# Patient Record
Sex: Female | Born: 1985 | Race: Black or African American | Hispanic: No | Marital: Single | Smoking: Current every day smoker
Health system: Southern US, Community
[De-identification: ages and names within clinical notes are randomized; demographics above are authoritative.]

## PROBLEM LIST (undated history)

## (undated) ENCOUNTER — Inpatient Hospital Stay (HOSPITAL_COMMUNITY): Payer: Self-pay

## (undated) DIAGNOSIS — J45909 Unspecified asthma, uncomplicated: Secondary | ICD-10-CM

## (undated) DIAGNOSIS — Z862 Personal history of diseases of the blood and blood-forming organs and certain disorders involving the immune mechanism: Secondary | ICD-10-CM

## (undated) DIAGNOSIS — E669 Obesity, unspecified: Secondary | ICD-10-CM

## (undated) DIAGNOSIS — F4310 Post-traumatic stress disorder, unspecified: Secondary | ICD-10-CM

## (undated) DIAGNOSIS — D649 Anemia, unspecified: Secondary | ICD-10-CM

## (undated) DIAGNOSIS — I10 Essential (primary) hypertension: Secondary | ICD-10-CM

## (undated) DIAGNOSIS — F32A Depression, unspecified: Secondary | ICD-10-CM

## (undated) DIAGNOSIS — G9600 Cerebrospinal fluid leak, unspecified: Secondary | ICD-10-CM

## (undated) DIAGNOSIS — R51 Headache: Secondary | ICD-10-CM

## (undated) DIAGNOSIS — K219 Gastro-esophageal reflux disease without esophagitis: Secondary | ICD-10-CM

## (undated) DIAGNOSIS — L905 Scar conditions and fibrosis of skin: Secondary | ICD-10-CM

## (undated) DIAGNOSIS — F419 Anxiety disorder, unspecified: Secondary | ICD-10-CM

## (undated) DIAGNOSIS — R12 Heartburn: Secondary | ICD-10-CM

## (undated) DIAGNOSIS — R519 Headache, unspecified: Secondary | ICD-10-CM

## (undated) DIAGNOSIS — E119 Type 2 diabetes mellitus without complications: Secondary | ICD-10-CM

## (undated) DIAGNOSIS — F329 Major depressive disorder, single episode, unspecified: Secondary | ICD-10-CM

## (undated) HISTORY — PX: BREAST SURGERY: SHX581

---

## 2013-04-01 ENCOUNTER — Other Ambulatory Visit: Payer: Self-pay | Admitting: Obstetrics & Gynecology

## 2013-04-01 DIAGNOSIS — O3680X0 Pregnancy with inconclusive fetal viability, not applicable or unspecified: Secondary | ICD-10-CM

## 2013-04-02 ENCOUNTER — Ambulatory Visit (INDEPENDENT_AMBULATORY_CARE_PROVIDER_SITE_OTHER): Payer: Medicaid Other

## 2013-04-02 ENCOUNTER — Other Ambulatory Visit: Payer: Self-pay | Admitting: Obstetrics & Gynecology

## 2013-04-02 DIAGNOSIS — O3680X0 Pregnancy with inconclusive fetal viability, not applicable or unspecified: Secondary | ICD-10-CM

## 2013-04-02 DIAGNOSIS — O26849 Uterine size-date discrepancy, unspecified trimester: Secondary | ICD-10-CM

## 2013-04-02 DIAGNOSIS — R1907 Generalized intra-abdominal and pelvic swelling, mass and lump: Secondary | ICD-10-CM

## 2013-04-02 NOTE — Progress Notes (Signed)
U/S-single IUP with +FCA noted, CRL c/w 8wks EDD 11/12/2013, cx long and closed, bilateral ovaries identified and wnl,**mid pelvic cystic mass =19.5x16.0cm, thin walled, no septations,debris or solid material noted, bilateral maternal kidneys appear wnl no hydronephrosis noted, no ascites noted. Dr. Despina Hidden observed real time scan of pelvic mass.

## 2013-04-14 ENCOUNTER — Encounter (HOSPITAL_COMMUNITY): Payer: Self-pay | Admitting: Obstetrics and Gynecology

## 2013-04-14 ENCOUNTER — Inpatient Hospital Stay (HOSPITAL_COMMUNITY)
Admission: AD | Admit: 2013-04-14 | Discharge: 2013-04-14 | Disposition: A | Discharge status: Home / Self Care | Payer: Medicaid Other | Source: Ambulatory Visit | Attending: Obstetrics & Gynecology | Admitting: Obstetrics & Gynecology

## 2013-04-14 DIAGNOSIS — O219 Vomiting of pregnancy, unspecified: Secondary | ICD-10-CM

## 2013-04-14 DIAGNOSIS — O210 Mild hyperemesis gravidarum: Secondary | ICD-10-CM

## 2013-04-14 HISTORY — DX: Obesity, unspecified: E66.9

## 2013-04-14 LAB — URINALYSIS, ROUTINE W REFLEX MICROSCOPIC
Bilirubin Urine: NEGATIVE
Hgb urine dipstick: NEGATIVE
Nitrite: NEGATIVE
Specific Gravity, Urine: 1.015 (ref 1.005–1.030)
Urobilinogen, UA: 0.2 mg/dL (ref 0.0–1.0)
pH: 8.5 — ABNORMAL HIGH (ref 5.0–8.0)

## 2013-04-14 MED ORDER — ONDANSETRON 8 MG PO TBDP: 8.0000 mg | ORAL_TABLET | Freq: Three times a day (TID) | ORAL | Status: DC | PRN

## 2013-04-14 MED ORDER — ONDANSETRON 8 MG PO TBDP
8.0000 mg | ORAL_TABLET | Freq: Once | ORAL | Status: AC
  Administered 2013-04-14: 8 mg via ORAL
  Filled 2013-04-14: qty 1

## 2013-04-14 NOTE — MAU Provider Note (Signed)
History     CSN: [id number]  Arrival date and time: 04/14/13 1019   None     Chief Complaint  Patient presents with  . Morning Sickness  . Headache   HPI 27 y.o. G1P0 at [redacted]w[redacted]d with n/v x 1 week, not able to keep anything down for the last couple of days.   Past Medical History  Diagnosis Date  . Ovarian cyst   . Obesity     Past Surgical History  Procedure Laterality Date  . No past surgeries      History reviewed. No pertinent family history.  History  Substance Use Topics  . Smoking status: Never Smoker   . Smokeless tobacco: Not on file  . Alcohol Use: No    Allergies: No Known Allergies  No prescriptions prior to admission    Review of Systems  Constitutional: Negative.   Respiratory: Negative.   Cardiovascular: Negative.   Gastrointestinal: Positive for nausea and vomiting. Negative for abdominal pain, diarrhea and constipation.  Genitourinary: Negative for dysuria, urgency, frequency, hematuria and flank pain.       Negative for vaginal bleeding, vaginal discharge  Musculoskeletal: Negative.   Neurological: Positive for headaches.  Psychiatric/Behavioral: Negative.    Physical Exam   Blood pressure 91/59, pulse 79, temperature 98.2 F (36.8 C), temperature source Oral, resp. rate 18.  Physical Exam  Nursing note and vitals reviewed. Constitutional: She is oriented to person, place, and time. She appears well-developed and well-nourished. No distress.  Morbidly obese   Cardiovascular: Regular rhythm.   Respiratory: Effort normal.  Musculoskeletal: Normal range of motion.  Neurological: She is alert and oriented to person, place, and time.  Skin: Skin is warm and dry.  Psychiatric: She has a normal mood and affect.    MAU Course  Procedures  Results for orders placed during the hospital encounter of 04/14/13 (from the past 24 hour(s))  URINALYSIS, ROUTINE W REFLEX MICROSCOPIC     Status: Abnormal   Collection Time    04/14/13 10:30 AM       Result Value Range   Color, Urine YELLOW  YELLOW   APPearance CLEAR  CLEAR   Specific Gravity, Urine 1.015  1.005 - 1.030   pH 8.5 (*) 5.0 - 8.0   Glucose, UA NEGATIVE  NEGATIVE mg/dL   Hgb urine dipstick NEGATIVE  NEGATIVE   Bilirubin Urine NEGATIVE  NEGATIVE   Ketones, ur NEGATIVE  NEGATIVE mg/dL   Protein, ur NEGATIVE  NEGATIVE mg/dL   Urobilinogen, UA 0.2  0.0 - 1.0 mg/dL   Nitrite NEGATIVE  NEGATIVE   Leukocytes, UA NEGATIVE  NEGATIVE   Zofran ODT 8 mg given in MAU, able to tolerate PO fluids  Assessment and Plan   1. Nausea and vomiting in pregnancy prior to [redacted] weeks gestation       Medication List         ondansetron 8 MG disintegrating tablet  Commonly known as:  ZOFRAN ODT  Take 1 tablet (8 mg total) by mouth every 8 (eight) hours as needed for nausea.     prenatal multivitamin Tabs  Take 1 tablet by mouth daily at 12 noon.     pseudoephedrine-acetaminophen 30-500 MG Tabs  Commonly known as:  TYLENOL SINUS  Take 2 tablets by mouth every 4 (four) hours as needed (sinus headache).        Follow-up Information   Follow up with [facility].   Contact information:   [address] [place]  [place] [address] [phone]        FRAZIER,NATALIE 04/14/2013, 2:24 PM

## 2013-04-14 NOTE — MAU Note (Signed)
Pt presents with complaints of nausea and vomiting that started approximately one week ago, she now has a headache and not able to keep anything down.

## 2013-04-15 ENCOUNTER — Encounter: Payer: Self-pay | Admitting: Women's Health

## 2013-04-15 ENCOUNTER — Other Ambulatory Visit (HOSPITAL_COMMUNITY)
Admission: RE | Admit: 2013-04-15 | Discharge: 2013-04-15 | Disposition: A | Discharge status: Home / Self Care | Payer: Medicaid Other | Source: Ambulatory Visit | Attending: Obstetrics & Gynecology | Admitting: Obstetrics & Gynecology

## 2013-04-15 ENCOUNTER — Ambulatory Visit (INDEPENDENT_AMBULATORY_CARE_PROVIDER_SITE_OTHER): Payer: Medicaid Other | Admitting: Women's Health

## 2013-04-15 VITALS — BP 136/76 | Ht 67.5 in | Wt 341.5 lb

## 2013-04-15 DIAGNOSIS — Z3400 Encounter for supervision of normal first pregnancy, unspecified trimester: Secondary | ICD-10-CM

## 2013-04-15 DIAGNOSIS — Z331 Pregnant state, incidental: Secondary | ICD-10-CM

## 2013-04-15 DIAGNOSIS — Z113 Encounter for screening for infections with a predominantly sexual mode of transmission: Secondary | ICD-10-CM | POA: Insufficient documentation

## 2013-04-15 DIAGNOSIS — Z1389 Encounter for screening for other disorder: Secondary | ICD-10-CM

## 2013-04-15 DIAGNOSIS — Z3401 Encounter for supervision of normal first pregnancy, first trimester: Secondary | ICD-10-CM

## 2013-04-15 DIAGNOSIS — R1900 Intra-abdominal and pelvic swelling, mass and lump, unspecified site: Secondary | ICD-10-CM

## 2013-04-15 DIAGNOSIS — Z01419 Encounter for gynecological examination (general) (routine) without abnormal findings: Secondary | ICD-10-CM | POA: Insufficient documentation

## 2013-04-15 DIAGNOSIS — Z6841 Body Mass Index (BMI) 40.0 and over, adult: Secondary | ICD-10-CM

## 2013-04-15 LAB — POCT URINALYSIS DIPSTICK
Blood, UA: NEGATIVE
Leukocytes, UA: NEGATIVE
Nitrite, UA: NEGATIVE
Protein, UA: NEGATIVE

## 2013-04-15 LAB — CBC
HCT: 32.8 % — ABNORMAL LOW (ref 36.0–46.0)
MCH: 27.9 pg (ref 26.0–34.0)
MCV: 81 fL (ref 78.0–100.0)
Platelets: 256 10*3/uL (ref 150–400)
RBC: 4.05 MIL/uL (ref 3.87–5.11)
WBC: 10.1 10*3/uL (ref 4.0–10.5)

## 2013-04-15 MED ORDER — PRENATAL MULTIVITAMIN CH: 1.0000 | ORAL_TABLET | Freq: Every day | ORAL | Status: DC

## 2013-04-15 NOTE — Progress Notes (Signed)
New OB packet given. Consents signed.  

## 2013-04-15 NOTE — Patient Instructions (Signed)
Nausea & Vomiting  Have saltine crackers or pretzels by your bed and eat a few bites before you raise your head out of bed in the morning  Eat small frequent meals throughout the day instead of large meals  Drink plenty of fluids throughout the day to stay hydrated, just don't drink a lot of fluids with your meals.  This can make your stomach fill up faster making you feel sick  Do not brush your teeth right after you eat  Products with real ginger are good for nausea, like ginger ale and ginger hard candy Make sure it says made with real ginger!  Sucking on sour candy like lemon heads is also good for nausea  If your prenatal vitamins make you nauseated, take them at night so you will sleep through the nausea  If you feel like you need medicine for the nausea & vomiting please let us know  If you are unable to keep any fluids or food down please let us know    Pregnancy - First Trimester During sexual intercourse, millions of sperm go into the vagina. Only 1 sperm will penetrate and fertilize the female egg while it is in the Fallopian tube. One week later, the fertilized egg implants into the wall of the uterus. An embryo begins to develop into a baby. At 6 to 8 weeks, the eyes and face are formed and the heartbeat can be seen on ultrasound. At the end of 12 weeks (first trimester), all the baby's organs are formed. Now that you are pregnant, you will want to do everything you can to have a healthy baby. Two of the most important things are to get good prenatal care and follow your caregiver's instructions. Prenatal care is all the medical care you receive before the baby's birth. It is given to prevent, find, and treat problems during the pregnancy and childbirth. PRENATAL EXAMS  During prenatal visits, your weight, blood pressure, and urine are checked. This is done to make sure you are healthy and progressing normally during the pregnancy.  A pregnant woman should gain 25 to 35 pounds  during the pregnancy. However, if you are overweight or underweight, your caregiver will advise you regarding your weight.  Your caregiver will ask and answer questions for you.  Blood work, cervical cultures, other necessary tests, and a Pap test are done during your prenatal exams. These tests are done to check on your health and the probable health of your baby. Tests are strongly recommended and done for HIV with your permission. This is the virus that causes AIDS. These tests are done because medicines can be given to help prevent your baby from being born with this infection should you have been infected without knowing it. Blood work is also used to find out your blood type, previous infections, and follow your blood levels (hemoglobin).  Low hemoglobin (anemia) is common during pregnancy. Iron and vitamins are given to help prevent this. Later in the pregnancy, blood tests for diabetes will be done along with any other tests if any problems develop.  You may need other tests to make sure you and the baby are doing well. CHANGES DURING THE FIRST TRIMESTER  Your body goes through many changes during pregnancy. They vary from person to person. Talk to your caregiver about changes you notice and are concerned about. Changes can include:  Your menstrual period stops.  The egg and sperm carry the genes that determine what you look like. Genes from you   and your partner are forming a baby. The female genes determine whether the baby is a boy or a girl.  Your body increases in girth and you may feel bloated.  Feeling sick to your stomach (nauseous) and throwing up (vomiting). If the vomiting is uncontrollable, call your caregiver.  Your breasts will begin to enlarge and become tender.  Your nipples may stick out more and become darker.  The need to urinate more. Painful urination may mean you have a bladder infection.  Tiring easily.  Loss of appetite.  Cravings for certain kinds of  food.  At first, you may gain or lose a couple of pounds.  You may have changes in your emotions from day to day (excited to be pregnant or concerned something may go wrong with the pregnancy and baby).  You may have more vivid and strange dreams. HOME CARE INSTRUCTIONS   It is very important to avoid all smoking, alcohol and non-prescribed drugs during your pregnancy. These affect the formation and growth of the baby. Avoid chemicals while pregnant to ensure the delivery of a healthy infant.  Start your prenatal visits by the 12th week of pregnancy. They are usually scheduled monthly at first, then more often in the last 2 months before delivery. Keep your caregiver's appointments. Follow your caregiver's instructions regarding medicine use, blood and lab tests, exercise, and diet.  During pregnancy, you are providing food for you and your baby. Eat regular, well-balanced meals. Choose foods such as meat, fish, milk and other low fat dairy products, vegetables, fruits, and whole-grain breads and cereals. Your caregiver will tell you of the ideal weight gain.  You can help morning sickness by keeping soda crackers at the bedside. Eat a couple before arising in the morning. You may want to use the crackers without salt on them.  Eating 4 to 5 small meals rather than 3 large meals a day also may help the nausea and vomiting.  Drinking liquids between meals instead of during meals also seems to help nausea and vomiting.  A physical sexual relationship may be continued throughout pregnancy if there are no other problems. Problems may be early (premature) leaking of amniotic fluid from the membranes, vaginal bleeding, or belly (abdominal) pain.  Exercise regularly if there are no restrictions. Check with your caregiver or physical therapist if you are unsure of the safety of some of your exercises. Greater weight gain will occur in the last 2 trimesters of pregnancy. Exercising will  help:  Control your weight.  Keep you in shape.  Prepare you for labor and delivery.  Help you lose your pregnancy weight after you deliver your baby.  Wear a good support or jogging bra for breast tenderness during pregnancy. This may help if worn during sleep too.  Ask when prenatal classes are available. Begin classes when they are offered.  Do not use hot tubs, steam rooms, or saunas.  Wear your seat belt when driving. This protects you and your baby if you are in an accident.  Avoid raw meat, uncooked cheese, cat litter boxes, and soil used by cats throughout the pregnancy. These carry germs that can cause birth defects in the baby.  The first trimester is a good time to visit your dentist for your dental health. Getting your teeth cleaned is okay. Use a softer toothbrush and brush gently during pregnancy.  Ask for help if you have financial, counseling, or nutritional needs during pregnancy. Your caregiver will be able to offer counseling for   these needs as well as refer you for other special needs.  Do not take any medicines or herbs unless told by your caregiver.  Inform your caregiver if there is any mental or physical domestic violence.  Make a list of emergency phone numbers of family, friends, hospital, and police and fire departments.  Write down your questions. Take them to your prenatal visit.  Do not douche.  Do not cross your legs.  If you have to stand for long periods of time, rotate you feet or take small steps in a circle.  You may have more vaginal secretions that may require a sanitary pad. Do not use tampons or scented sanitary pads. MEDICINES AND DRUG USE IN PREGNANCY  Take prenatal vitamins as directed. The vitamin should contain 1 milligram of folic acid. Keep all vitamins out of reach of children. Only a couple vitamins or tablets containing iron may be fatal to a baby or young child when ingested.  Avoid use of all medicines, including herbs,  over-the-counter medicines, not prescribed or suggested by your caregiver. Only take over-the-counter or prescription medicines for pain, discomfort, or fever as directed by your caregiver. Do not use aspirin, ibuprofen, or naproxen unless directed by your caregiver.  Let your caregiver also know about herbs you may be using.  Alcohol is related to a number of birth defects. This includes fetal alcohol syndrome. All alcohol, in any form, should be avoided completely. Smoking will cause low birth rate and premature babies.  Street or illegal drugs are very harmful to the baby. They are absolutely forbidden. A baby born to an addicted mother will be addicted at birth. The baby will go through the same withdrawal an adult does.  Let your caregiver know about any medicines that you have to take and for what reason you take them. SEEK MEDICAL CARE IF:  You have any concerns or worries during your pregnancy. It is better to call with your questions if you feel they cannot wait, rather than worry about them. SEEK IMMEDIATE MEDICAL CARE IF:   An unexplained oral temperature above 102 F (38.9 C) develops, or as your caregiver suggests.  You have leaking of fluid from the vagina (birth canal). If leaking membranes are suspected, take your temperature and inform your caregiver of this when you call.  There is vaginal spotting or bleeding. Notify your caregiver of the amount and how many pads are used.  You develop a bad smelling vaginal discharge with a change in the color.  You continue to feel sick to your stomach (nauseated) and have no relief from remedies suggested. You vomit blood or coffee ground-like materials.  You lose more than 2 pounds of weight in 1 week.  You gain more than 2 pounds of weight in 1 week and you notice swelling of your face, hands, feet, or legs.  You gain 5 pounds or more in 1 week (even if you do not have swelling of your hands, face, legs, or feet).  You get  exposed to German measles and have never had them.  You are exposed to fifth disease or chickenpox.  You develop belly (abdominal) pain. Round ligament discomfort is a common non-cancerous (benign) cause of abdominal pain in pregnancy. Your caregiver still must evaluate this.  You develop headache, fever, diarrhea, pain with urination, or shortness of breath.  You fall or are in a car accident or have any kind of trauma.  There is mental or physical violence in your home. Document   Released: 09/27/2001 Document Revised: 06/27/2012 Document Reviewed: 03/31/2009 [employer] Patient Information 2014 [employer].  

## 2013-04-15 NOTE — Progress Notes (Signed)
  Subjective:    Judy Nelson is a 27 y.o. G1P0 African American female at [redacted]w[redacted]d by 8wk u/s, being seen today for her first obstetrical visit.  Her obstetrical history is significant for obesity and pelvic mass.  Pregnancy history fully reviewed. U/S on 6/17 @ 8.0wks revealed 19.5x16cm cystic mass mid pelvis, thin-walled, no septations, debris, or solid materials noted. Bilateral ovaries normal and identified separately from mass.   Patient reports daily n/v, went to [facility] and was rx'd zofran, which is helping.  Denies craming, vb, or UTI s/s.   Filed Vitals:   04/15/13 1605  BP: 136/76  Weight: 341 lb 8 oz (154.903 kg)    HISTORY: OB History   Grav Para Term Preterm Abortions TAB SAB Ect Mult Living   1              # Outc Date GA Lbr Len/2nd Wgt Sex Del Anes PTL Lv   1 CUR              Past Medical History  Diagnosis Date  . Ovarian cyst   . Obesity    Past Surgical History  Procedure Laterality Date  . No past surgeries     History reviewed. No pertinent family history.   Exam    Pelvic Exam:    Perineum: Normal Perineum   Vulva: normal   Vagina:  normal mucosa, normal discharge, no palpable nodules   Uterus        Cervix: normal   Adnexa: Not palpable   Urinary: urethral meatus normal    System:     Skin: normal coloration and turgor, no rashes    Neurologic: oriented, normal mood   Extremities: normal strength, tone, and muscle mass   HEENT PERRLA   Mouth/Teeth mucous membranes moist   Cardiovascular: regular rate and rhythm   Respiratory:  appears well, vitals normal, no respiratory distress, acyanotic, normal RR   Abdomen: soft, non-tender   FHR 173 via u/s Thin prep pap smear obtained    Assessment:    Pregnancy: G1P0 There are no active problems to display for this patient.     [redacted]w[redacted]d G1P0 New OB visit Pregravid BMI 53.1 N/V pregnancy Pelvic mass    Plan:     Initial labs drawn Continue prenatal vitamins Problem list reviewed and  updated Reviewed n/v relief measures and warning s/s to report Reviewed recommended weight gain based on pre-gravid BMI Encouraged well-balanced diet, daily walking Genetic Screening discussed Integrated Screen: declined Cystic fibrosis screening discussed declined Ultrasound discussed; fetal survey: requested Follow up in 4 weeks for visit  Marge Duncans 04/15/2013 4:23 PM

## 2013-04-16 LAB — URINALYSIS
Bilirubin Urine: NEGATIVE
Hgb urine dipstick: NEGATIVE
Ketones, ur: NEGATIVE mg/dL
Nitrite: NEGATIVE
Urobilinogen, UA: 1 mg/dL (ref 0.0–1.0)

## 2013-04-16 LAB — DRUG SCREEN, URINE, NO CONFIRMATION
Barbiturate Quant, Ur: NEGATIVE
Cocaine Metabolites: NEGATIVE
Creatinine,U: 182.8 mg/dL
Marijuana Metabolite: NEGATIVE
Opiate Screen, Urine: NEGATIVE
Phencyclidine (PCP): NEGATIVE

## 2013-04-16 LAB — ANTIBODY SCREEN: Antibody Screen: NEGATIVE

## 2013-04-16 LAB — ABO AND RH

## 2013-04-16 LAB — RUBELLA SCREEN: Rubella: 1.98 Index — ABNORMAL HIGH (ref <0.90)

## 2013-04-16 LAB — HIV ANTIBODY (ROUTINE TESTING W REFLEX): HIV: NONREACTIVE

## 2013-04-16 LAB — VARICELLA ZOSTER ANTIBODY, IGG: Varicella IgG: 2990 Index — ABNORMAL HIGH (ref <135.00)

## 2013-04-17 LAB — URINE CULTURE

## 2013-04-20 ENCOUNTER — Encounter: Payer: Self-pay | Admitting: Women's Health

## 2013-05-15 ENCOUNTER — Encounter: Payer: Self-pay | Admitting: Advanced Practice Midwife

## 2013-05-15 ENCOUNTER — Ambulatory Visit (INDEPENDENT_AMBULATORY_CARE_PROVIDER_SITE_OTHER): Payer: Medicaid Other | Admitting: Advanced Practice Midwife

## 2013-05-15 VITALS — BP 120/70 | Wt 342.0 lb

## 2013-05-15 DIAGNOSIS — Z331 Pregnant state, incidental: Secondary | ICD-10-CM

## 2013-05-15 DIAGNOSIS — E669 Obesity, unspecified: Secondary | ICD-10-CM

## 2013-05-15 DIAGNOSIS — O99019 Anemia complicating pregnancy, unspecified trimester: Secondary | ICD-10-CM

## 2013-05-15 DIAGNOSIS — O219 Vomiting of pregnancy, unspecified: Secondary | ICD-10-CM

## 2013-05-15 DIAGNOSIS — Z1389 Encounter for screening for other disorder: Secondary | ICD-10-CM

## 2013-05-15 DIAGNOSIS — O210 Mild hyperemesis gravidarum: Secondary | ICD-10-CM

## 2013-05-15 DIAGNOSIS — O99210 Obesity complicating pregnancy, unspecified trimester: Secondary | ICD-10-CM

## 2013-05-15 LAB — POCT URINALYSIS DIPSTICK
Blood, UA: NEGATIVE
Glucose, UA: NEGATIVE
Ketones, UA: NEGATIVE

## 2013-05-15 MED ORDER — ONDANSETRON 8 MG PO TBDP: 8.0000 mg | ORAL_TABLET | Freq: Three times a day (TID) | ORAL | Status: DC | PRN

## 2013-05-15 NOTE — Progress Notes (Signed)
NEED REFILL ON ZOFRAN.

## 2013-05-15 NOTE — Progress Notes (Signed)
Diclegis samples given.  Routine questions about pregnancy answered.  F/U in 4 weeks for LROB.

## 2013-05-15 NOTE — Addendum Note (Signed)
Addended by: Cheral Marker on: 05/15/2013 10:47 AM   Modules accepted: Orders

## 2013-05-27 ENCOUNTER — Encounter: Payer: Self-pay | Admitting: Women's Health

## 2013-05-27 ENCOUNTER — Telehealth: Payer: Self-pay | Admitting: Obstetrics and Gynecology

## 2013-05-27 ENCOUNTER — Ambulatory Visit (INDEPENDENT_AMBULATORY_CARE_PROVIDER_SITE_OTHER): Payer: Medicaid Other | Admitting: Women's Health

## 2013-05-27 VITALS — BP 126/80 | Wt 344.0 lb

## 2013-05-27 DIAGNOSIS — Z1389 Encounter for screening for other disorder: Secondary | ICD-10-CM

## 2013-05-27 DIAGNOSIS — J069 Acute upper respiratory infection, unspecified: Secondary | ICD-10-CM

## 2013-05-27 DIAGNOSIS — Z331 Pregnant state, incidental: Secondary | ICD-10-CM

## 2013-05-27 DIAGNOSIS — Z3402 Encounter for supervision of normal first pregnancy, second trimester: Secondary | ICD-10-CM

## 2013-05-27 DIAGNOSIS — E669 Obesity, unspecified: Secondary | ICD-10-CM

## 2013-05-27 DIAGNOSIS — O99019 Anemia complicating pregnancy, unspecified trimester: Secondary | ICD-10-CM

## 2013-05-27 LAB — POCT URINALYSIS DIPSTICK
Glucose, UA: NEGATIVE
Ketones, UA: NEGATIVE
Leukocytes, UA: NEGATIVE

## 2013-05-27 NOTE — Patient Instructions (Addendum)
You may take zyrtec-D or claritin-D as directed on box to help with symptoms. Drink enough water to stay hydrated. Rest. Debrox for wax in ears. You can try a netty pot to help with the congestion. Call back if symptoms aren't improving, or are getting worse. Upper Respiratory Infection, Adult An upper respiratory infection (URI) is also sometimes known as the common cold. The upper respiratory tract includes the nose, sinuses, throat, trachea, and bronchi. Bronchi are the airways leading to the lungs. Most people improve within 1 week, but symptoms can last up to 2 weeks. A residual cough may last even longer.  CAUSES Many different viruses can infect the tissues lining the upper respiratory tract. The tissues become irritated and inflamed and often become very moist. Mucus production is also common. A cold is contagious. You can easily spread the virus to others by oral contact. This includes kissing, sharing a glass, coughing, or sneezing. Touching your mouth or nose and then touching a surface, which is then touched by another person, can also spread the virus. SYMPTOMS  Symptoms typically develop 1 to 3 days after you come in contact with a cold virus. Symptoms vary from person to person. They may include:  Runny nose.  Sneezing.  Nasal congestion.  Sinus irritation.  Sore throat.  Loss of voice (laryngitis).  Cough.  Fatigue.  Muscle aches.  Loss of appetite.  Headache.  Low-grade fever. DIAGNOSIS  You might diagnose your own cold based on familiar symptoms, since most people get a cold 2 to 3 times a year. Your caregiver can confirm this based on your exam. Most importantly, your caregiver can check that your symptoms are not due to another disease such as strep throat, sinusitis, pneumonia, asthma, or epiglottitis. Blood tests, throat tests, and X-rays are not necessary to diagnose a common cold, but they may sometimes be helpful in excluding other more serious diseases. Your  caregiver will decide if any further tests are required. RISKS AND COMPLICATIONS  You may be at risk for a more severe case of the common cold if you smoke cigarettes, have chronic heart disease (such as heart failure) or lung disease (such as asthma), or if you have a weakened immune system. The very young and very old are also at risk for more serious infections. Bacterial sinusitis, middle ear infections, and bacterial pneumonia can complicate the common cold. The common cold can worsen asthma and chronic obstructive pulmonary disease (COPD). Sometimes, these complications can require emergency medical care and may be life-threatening. PREVENTION  The best way to protect against getting a cold is to practice good hygiene. Avoid oral or hand contact with people with cold symptoms. Wash your hands often if contact occurs. There is no clear evidence that vitamin C, vitamin E, echinacea, or exercise reduces the chance of developing a cold. However, it is always recommended to get plenty of rest and practice good nutrition. TREATMENT  Treatment is directed at relieving symptoms. There is no cure. Antibiotics are not effective, because the infection is caused by a virus, not by bacteria. Treatment may include:  Increased fluid intake. Sports drinks offer valuable electrolytes, sugars, and fluids.  Breathing heated mist or steam (vaporizer or shower).  Eating chicken soup or other clear broths, and maintaining good nutrition.  Getting plenty of rest.  Using gargles or lozenges for comfort.  Controlling fevers with ibuprofen or acetaminophen as directed by your caregiver.  Increasing usage of your inhaler if you have asthma. Zinc gel and zinc  lozenges, taken in the first 24 hours of the common cold, can shorten the duration and lessen the severity of symptoms. Pain medicines may help with fever, muscle aches, and throat pain. A variety of non-prescription medicines are available to treat congestion  and runny nose. Your caregiver can make recommendations and may suggest nasal or lung inhalers for other symptoms.  HOME CARE INSTRUCTIONS   Only take over-the-counter or prescription medicines for pain, discomfort, or fever as directed by your caregiver.  Use a warm mist humidifier or inhale steam from a shower to increase air moisture. This may keep secretions moist and make it easier to breathe.  Drink enough water and fluids to keep your urine clear or pale yellow.  Rest as needed.  Return to work when your temperature has returned to normal or as your caregiver advises. You may need to stay home longer to avoid infecting others. You can also use a face mask and careful hand washing to prevent spread of the virus. SEEK MEDICAL CARE IF:   After the first few days, you feel you are getting worse rather than better.  You need your caregiver's advice about medicines to control symptoms.  You develop chills, worsening shortness of breath, or brown or red sputum. These may be signs of pneumonia.  You develop yellow or brown nasal discharge or pain in the face, especially when you bend forward. These may be signs of sinusitis.  You develop a fever, swollen neck glands, pain with swallowing, or white areas in the back of your throat. These may be signs of strep throat. SEEK IMMEDIATE MEDICAL CARE IF:   You have a fever.  You develop severe or persistent headache, ear pain, sinus pain, or chest pain.  You develop wheezing, a prolonged cough, cough up blood, or have a change in your usual mucus (if you have chronic lung disease).  You develop sore muscles or a stiff neck. Document Released: 03/29/2001 Document Revised: 12/26/2011 Document Reviewed: 02/04/2011 [facility] Patient Information 2014 [place], [place].

## 2013-05-27 NOTE — Progress Notes (Signed)
Pt worked in today for cold symptoms that just won't go away. OTC's not working.

## 2013-05-27 NOTE — Telephone Encounter (Signed)
Spoke with pt. Has had cold symptoms over the weekend. + congestion, stuffy nose, hard to breathe through nose. Ears popping. No fever, + headache. Hurts around nose and sinus area. No wheezing. Has tried Robitussin, Sudafed, and Tylenol Sinus with no improvement. Appt scheduled to come in. JSY

## 2013-05-27 NOTE — Progress Notes (Signed)
Work in: nasal congestion since Thursday. Ears pop/ring, but no pain. No sore throat, fever/chills, non-smoker. Has tried OTC measures w/o relief.  Lt ear: much cerumen, unable to view TM, no pain to palpation of tragus or pinna. Rt ear: much cerumen, but TM appears normal. Oropharynx non-erythematous w/o exudate. 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 Co-exam w/ JAG. Recommend Zyrtec-D or Claritin-D, debrox for cerumen removal, sleep in recliner, call if worsens, or not improving. Keep next appt as scheduled in few weeks.

## 2013-06-12 ENCOUNTER — Encounter: Payer: Self-pay | Admitting: Advanced Practice Midwife

## 2013-06-12 ENCOUNTER — Ambulatory Visit (INDEPENDENT_AMBULATORY_CARE_PROVIDER_SITE_OTHER): Payer: Medicaid Other | Admitting: Advanced Practice Midwife

## 2013-06-12 VITALS — BP 110/80 | Wt 348.0 lb

## 2013-06-12 DIAGNOSIS — O99019 Anemia complicating pregnancy, unspecified trimester: Secondary | ICD-10-CM

## 2013-06-12 DIAGNOSIS — Z3402 Encounter for supervision of normal first pregnancy, second trimester: Secondary | ICD-10-CM

## 2013-06-12 DIAGNOSIS — Z1389 Encounter for screening for other disorder: Secondary | ICD-10-CM

## 2013-06-12 DIAGNOSIS — E669 Obesity, unspecified: Secondary | ICD-10-CM

## 2013-06-12 DIAGNOSIS — Z331 Pregnant state, incidental: Secondary | ICD-10-CM

## 2013-06-12 LAB — POCT URINALYSIS DIPSTICK: Blood, UA: NEGATIVE

## 2013-06-12 NOTE — Progress Notes (Signed)
No c/o at this time.  Routine questions about pregnancy answered.  F/U in 2 weeks for LROB, reevaluate pelvic mass.

## 2013-06-25 ENCOUNTER — Encounter: Payer: Self-pay | Admitting: Obstetrics & Gynecology

## 2013-06-25 ENCOUNTER — Ambulatory Visit (INDEPENDENT_AMBULATORY_CARE_PROVIDER_SITE_OTHER): Payer: Medicaid Other | Admitting: Obstetrics & Gynecology

## 2013-06-25 ENCOUNTER — Ambulatory Visit (INDEPENDENT_AMBULATORY_CARE_PROVIDER_SITE_OTHER): Payer: Medicaid Other

## 2013-06-25 VITALS — BP 120/80 | Wt 341.0 lb

## 2013-06-25 DIAGNOSIS — Z3402 Encounter for supervision of normal first pregnancy, second trimester: Secondary | ICD-10-CM

## 2013-06-25 DIAGNOSIS — Z1389 Encounter for screening for other disorder: Secondary | ICD-10-CM

## 2013-06-25 DIAGNOSIS — Z331 Pregnant state, incidental: Secondary | ICD-10-CM

## 2013-06-25 DIAGNOSIS — E669 Obesity, unspecified: Secondary | ICD-10-CM

## 2013-06-25 DIAGNOSIS — O99019 Anemia complicating pregnancy, unspecified trimester: Secondary | ICD-10-CM

## 2013-06-25 LAB — POCT URINALYSIS DIPSTICK
Ketones, UA: NEGATIVE
Protein, UA: 1

## 2013-06-25 MED ORDER — OMEPRAZOLE 20 MG PO CPDR: 20.0000 mg | DELAYED_RELEASE_CAPSULE | Freq: Every day | ORAL | Status: DC

## 2013-06-25 NOTE — Progress Notes (Signed)
U/S(20+0wks)-active fetus, meas c/w dates, fluid wnl, post gr 0 plac, cx long and closed, Lt ov appears wnl, Rt adnexal cystic mass remains =19.8x17.2x11cm, no obvious abnl noted although incomplete anatomy screen due to fetal position and maternal body habitus, would like to reck anatomy and cystic mass at about 26 weeks, female fetus

## 2013-06-25 NOTE — Progress Notes (Signed)
For UP U/S

## 2013-06-25 NOTE — Progress Notes (Signed)
Sonogram noted, will repeat in 6-8 weeks at the time of PNII BP weight and urine results all reviewed and noted. Patient reports good fetal movement, denies any bleeding and no rupture of membranes symptoms or regular contractions. Patient is without complaints. All questions were answered.

## 2013-07-16 ENCOUNTER — Encounter: Payer: Self-pay | Admitting: Women's Health

## 2013-07-16 ENCOUNTER — Ambulatory Visit (INDEPENDENT_AMBULATORY_CARE_PROVIDER_SITE_OTHER): Payer: Medicaid Other | Admitting: Women's Health

## 2013-07-16 VITALS — BP 128/74 | Wt 347.0 lb

## 2013-07-16 DIAGNOSIS — Z23 Encounter for immunization: Secondary | ICD-10-CM

## 2013-07-16 DIAGNOSIS — Z331 Pregnant state, incidental: Secondary | ICD-10-CM

## 2013-07-16 DIAGNOSIS — Z1389 Encounter for screening for other disorder: Secondary | ICD-10-CM

## 2013-07-16 DIAGNOSIS — Z3402 Encounter for supervision of normal first pregnancy, second trimester: Secondary | ICD-10-CM

## 2013-07-16 DIAGNOSIS — E669 Obesity, unspecified: Secondary | ICD-10-CM

## 2013-07-16 DIAGNOSIS — O99019 Anemia complicating pregnancy, unspecified trimester: Secondary | ICD-10-CM

## 2013-07-16 LAB — POCT URINALYSIS DIPSTICK
Glucose, UA: NEGATIVE
Ketones, UA: NEGATIVE
Leukocytes, UA: NEGATIVE

## 2013-07-16 MED ORDER — INFLUENZA VAC SPLIT QUAD 0.5 ML IM SUSP
0.5000 mL | Freq: Once | INTRAMUSCULAR | Status: AC
  Administered 2013-07-16: 0.5 mL via INTRAMUSCULAR

## 2013-07-16 NOTE — Progress Notes (Signed)
Reports good fm. Denies uc's, lof, vb, urinary frequency, urgency, hesitancy, or dysuria.  No complaints.  FH measured from U. Desires flu shot today. Reviewed ptl s/s, fm.  All questions answered. F/U in 3wks for pn2, f/u anatomy u/s, and visit.

## 2013-07-16 NOTE — Patient Instructions (Addendum)
You will have your sugar test next visit.  Please do not eat or drink anything after midnight the night before you come, not even water.  You will be here for at least two hours.    Pregnancy - Second Trimester The second trimester of pregnancy (3 to 6 months) is a period of rapid growth for you and your baby. At the end of the sixth month, your baby is about 9 inches long and weighs 1 1/2 pounds. You will begin to feel the baby move between 18 and 20 weeks of the pregnancy. This is called quickening. Weight gain is faster. A clear fluid (colostrum) may leak out of your breasts. You may feel small contractions of the womb (uterus). This is known as false labor or Braxton-Hicks contractions. This is like a practice for labor when the baby is ready to be born. Usually, the problems with morning sickness have usually passed by the end of your first trimester. Some women develop small dark blotches (called cholasma, mask of pregnancy) on their face that usually goes away after the baby is born. Exposure to the sun makes the blotches worse. Acne may also develop in some pregnant women and pregnant women who have acne, may find that it goes away. PRENATAL EXAMS  Blood work may continue to be done during prenatal exams. These tests are done to check on your health and the probable health of your baby. Blood work is used to follow your blood levels (hemoglobin). Anemia (low hemoglobin) is common during pregnancy. Iron and vitamins are given to help prevent this. You will also be checked for diabetes between 24 and 28 weeks of the pregnancy. Some of the previous blood tests may be repeated.  The size of the uterus is measured during each visit. This is to make sure that the baby is continuing to grow properly according to the dates of the pregnancy.  Your blood pressure is checked every prenatal visit. This is to make sure you are not getting toxemia.  Your urine is checked to make sure you do not have an  infection, diabetes or protein in the urine.  Your weight is checked often to make sure gains are happening at the suggested rate. This is to ensure that both you and your baby are growing normally.  Sometimes, an ultrasound is performed to confirm the proper growth and development of the baby. This is a test which bounces harmless sound waves off the baby so your caregiver can more accurately determine due dates. Sometimes, a test is done on the amniotic fluid surrounding the baby. This test is called an amniocentesis. The amniotic fluid is obtained by sticking a needle into the belly (abdomen). This is done to check the chromosomes in instances where there is a concern about possible genetic problems with the baby. It is also sometimes done near the end of pregnancy if an early delivery is required. In this case, it is done to help make sure the baby's lungs are mature enough for the baby to live outside of the womb. CHANGES OCCURING IN THE SECOND TRIMESTER OF PREGNANCY Your body goes through many changes during pregnancy. They vary from person to person. Talk to your caregiver about changes you notice that you are concerned about.  During the second trimester, you will likely have an increase in your appetite. It is normal to have cravings for certain foods. This varies from person to person and pregnancy to pregnancy.  Your lower abdomen will begin to   bulge.  You may have to urinate more often because the uterus and baby are pressing on your bladder. It is also common to get more bladder infections during pregnancy. You can help this by drinking lots of fluids and emptying your bladder before and after intercourse.  You may begin to get stretch marks on your hips, abdomen, and breasts. These are normal changes in the body during pregnancy. There are no exercises or medicines to take that prevent this change.  You may begin to develop swollen and bulging veins (varicose veins) in your legs.  Wearing support hose, elevating your feet for 15 minutes, 3 to 4 times a day and limiting salt in your diet helps lessen the problem.  Heartburn may develop as the uterus grows and pushes up against the stomach. Antacids recommended by your caregiver helps with this problem. Also, eating smaller meals 4 to 5 times a day helps.  Constipation can be treated with a stool softener or adding bulk to your diet. Drinking lots of fluids, and eating vegetables, fruits, and whole grains are helpful.  Exercising is also helpful. If you have been very active up until your pregnancy, most of these activities can be continued during your pregnancy. If you have been less active, it is helpful to start an exercise program such as walking.  Hemorrhoids may develop at the end of the second trimester. Warm sitz baths and hemorrhoid cream recommended by your caregiver helps hemorrhoid problems.  Backaches may develop during this time of your pregnancy. Avoid heavy lifting, wear low heal shoes, and practice good posture to help with backache problems.  Some pregnant women develop tingling and numbness of their hand and fingers because of swelling and tightening of ligaments in the wrist (carpel tunnel syndrome). This goes away after the baby is born.  As your breasts enlarge, you may have to get a bigger bra. Get a comfortable, cotton, support bra. Do not get a nursing bra until the last month of the pregnancy if you will be nursing the baby.  You may get a dark line from your belly button to the pubic area called the linea nigra.  You may develop rosy cheeks because of increase blood flow to the face.  You may develop spider looking lines of the face, neck, arms, and chest. These go away after the baby is born. HOME CARE INSTRUCTIONS   It is extremely important to avoid all smoking, herbs, alcohol, and unprescribed drugs during your pregnancy. These chemicals affect the formation and growth of the baby. Avoid  these chemicals throughout the pregnancy to ensure the delivery of a healthy infant.  Most of your home care instructions are the same as suggested for the first trimester of your pregnancy. Keep your caregiver's appointments. Follow your caregiver's instructions regarding medicine use, exercise, and diet.  During pregnancy, you are providing food for you and your baby. Continue to eat regular, well-balanced meals. Choose foods such as meat, fish, milk and other low fat dairy products, vegetables, fruits, and whole-grain breads and cereals. Your caregiver will tell you of the ideal weight gain.  A physical sexual relationship may be continued up until near the end of pregnancy if there are no other problems. Problems could include early (premature) leaking of amniotic fluid from the membranes, vaginal bleeding, abdominal pain, or other medical or pregnancy problems.  Exercise regularly if there are no restrictions. Check with your caregiver if you are unsure of the safety of some of your exercises. The   greatest weight gain will occur in the last 2 trimesters of pregnancy. Exercise will help you:  Control your weight.  Get you in shape for labor and delivery.  Lose weight after you have the baby.  Wear a good support or jogging bra for breast tenderness during pregnancy. This may help if worn during sleep. Pads or tissues may be used in the bra if you are leaking colostrum.  Do not use hot tubs, steam rooms or saunas throughout the pregnancy.  Wear your seat belt at all times when driving. This protects you and your baby if you are in an accident.  Avoid raw meat, uncooked cheese, cat litter boxes, and soil used by cats. These carry germs that can cause birth defects in the baby.  The second trimester is also a good time to visit your dentist for your dental health if this has not been done yet. Getting your teeth cleaned is okay. Use a soft toothbrush. Brush gently during pregnancy.  It is  easier to leak urine during pregnancy. Tightening up and strengthening the pelvic muscles will help with this problem. Practice stopping your urination while you are going to the bathroom. These are the same muscles you need to strengthen. It is also the muscles you would use as if you were trying to stop from passing gas. You can practice tightening these muscles up 10 times a set and repeating this about 3 times per day. Once you know what muscles to tighten up, do not perform these exercises during urination. It is more likely to contribute to an infection by backing up the urine.  Ask for help if you have financial, counseling, or nutritional needs during pregnancy. Your caregiver will be able to offer counseling for these needs as well as refer you for other special needs.  Your skin may become oily. If so, wash your face with mild soap, use non-greasy moisturizer and oil or cream based makeup. MEDICINES AND DRUG USE IN PREGNANCY  Take prenatal vitamins as directed. The vitamin should contain 1 milligram of folic acid. Keep all vitamins out of reach of children. Only a couple vitamins or tablets containing iron may be fatal to a baby or young child when ingested.  Avoid use of all medicines, including herbs, over-the-counter medicines, not prescribed or suggested by your caregiver. Only take over-the-counter or prescription medicines for pain, discomfort, or fever as directed by your caregiver. Do not use aspirin.  Let your caregiver also know about herbs you may be using.  Alcohol is related to a number of birth defects. This includes fetal alcohol syndrome. All alcohol, in any form, should be avoided completely. Smoking will cause low birth rate and premature babies.  Street or illegal drugs are very harmful to the baby. They are absolutely forbidden. A baby born to an addicted mother will be addicted at birth. The baby will go through the same withdrawal an adult does. SEEK MEDICAL CARE IF:    You have any concerns or worries during your pregnancy. It is better to call with your questions if you feel they cannot wait, rather than worry about them. SEEK IMMEDIATE MEDICAL CARE IF:   An unexplained oral temperature above 102 F (38.9 C) develops, or as your caregiver suggests.  You have leaking of fluid from the vagina (birth canal). If leaking membranes are suspected, take your temperature and tell your caregiver of this when you call.  There is vaginal spotting, bleeding, or passing clots. Tell your caregiver of   the amount and how many pads are used. Light spotting in pregnancy is common, especially following intercourse.  You develop a bad smelling vaginal discharge with a change in the color from clear to white.  You continue to feel sick to your stomach (nauseated) and have no relief from remedies suggested. You vomit blood or coffee ground-like materials.  You lose more than 2 pounds of weight or gain more than 2 pounds of weight over 1 week, or as suggested by your caregiver.  You notice swelling of your face, hands, feet, or legs.  You get exposed to German measles and have never had them.  You are exposed to fifth disease or chickenpox.  You develop belly (abdominal) pain. Round ligament discomfort is a common non-cancerous (benign) cause of abdominal pain in pregnancy. Your caregiver still must evaluate you.  You develop a bad headache that does not go away.  You develop fever, diarrhea, pain with urination, or shortness of breath.  You develop visual problems, blurry, or double vision.  You fall or are in a car accident or any kind of trauma.  There is mental or physical violence at home. Document Released: 09/27/2001 Document Revised: 06/27/2012 Document Reviewed: 04/01/2009 [employer] Patient Information 2014 [employer].  

## 2013-07-16 NOTE — Progress Notes (Signed)
Pt here today for routine visit. Pt states that she has had some round ligament pains at times. Pt states she is still having some nausea. Pt denies any other problems or concerns at this time.

## 2013-08-06 ENCOUNTER — Encounter: Payer: Self-pay | Admitting: Women's Health

## 2013-08-06 ENCOUNTER — Other Ambulatory Visit: Payer: Medicaid Other

## 2013-08-06 ENCOUNTER — Other Ambulatory Visit: Payer: Self-pay | Admitting: Women's Health

## 2013-08-06 ENCOUNTER — Ambulatory Visit (INDEPENDENT_AMBULATORY_CARE_PROVIDER_SITE_OTHER): Payer: Medicaid Other | Admitting: Women's Health

## 2013-08-06 ENCOUNTER — Ambulatory Visit (INDEPENDENT_AMBULATORY_CARE_PROVIDER_SITE_OTHER): Payer: Medicaid Other

## 2013-08-06 ENCOUNTER — Encounter (INDEPENDENT_AMBULATORY_CARE_PROVIDER_SITE_OTHER): Payer: Self-pay

## 2013-08-06 VITALS — BP 112/72 | Wt 326.0 lb

## 2013-08-06 DIAGNOSIS — R1900 Intra-abdominal and pelvic swelling, mass and lump, unspecified site: Secondary | ICD-10-CM

## 2013-08-06 DIAGNOSIS — O358XX0 Maternal care for other (suspected) fetal abnormality and damage, not applicable or unspecified: Secondary | ICD-10-CM

## 2013-08-06 DIAGNOSIS — E669 Obesity, unspecified: Secondary | ICD-10-CM

## 2013-08-06 DIAGNOSIS — O99019 Anemia complicating pregnancy, unspecified trimester: Secondary | ICD-10-CM

## 2013-08-06 DIAGNOSIS — O99212 Obesity complicating pregnancy, second trimester: Secondary | ICD-10-CM

## 2013-08-06 DIAGNOSIS — Z331 Pregnant state, incidental: Secondary | ICD-10-CM

## 2013-08-06 DIAGNOSIS — Z1389 Encounter for screening for other disorder: Secondary | ICD-10-CM

## 2013-08-06 DIAGNOSIS — Z3402 Encounter for supervision of normal first pregnancy, second trimester: Secondary | ICD-10-CM

## 2013-08-06 DIAGNOSIS — O99891 Other specified diseases and conditions complicating pregnancy: Secondary | ICD-10-CM

## 2013-08-06 DIAGNOSIS — Z3400 Encounter for supervision of normal first pregnancy, unspecified trimester: Secondary | ICD-10-CM

## 2013-08-06 LAB — POCT URINALYSIS DIPSTICK
Blood, UA: NEGATIVE
Ketones, UA: NEGATIVE

## 2013-08-06 LAB — CBC
HCT: 30.7 % — ABNORMAL LOW (ref 36.0–46.0)
Hemoglobin: 10.6 g/dL — ABNORMAL LOW (ref 12.0–15.0)
MCHC: 34.5 g/dL (ref 30.0–36.0)
RBC: 3.78 MIL/uL — ABNORMAL LOW (ref 3.87–5.11)
WBC: 13.5 10*3/uL — ABNORMAL HIGH (ref 4.0–10.5)

## 2013-08-06 NOTE — Progress Notes (Signed)
Reports good fm. Denies uc's, lof, vb, urinary frequency, urgency, hesitancy, or dysuria.  No complaints.  Reviewed u/s, ptl s/s, fm.  All questions answered. PN2 today. F/U in 4wks for visit.

## 2013-08-06 NOTE — Patient Instructions (Signed)

## 2013-08-06 NOTE — Progress Notes (Signed)
U/S(26+0wks)- vtx active fetus, approp growth EFW 1 lb 15 oz (49th%tile), fluid wnl, post gr 1 plac, no obvious abnl noted although Rt cystic adnexal mass remains = 19.5 x 18.9 x 15.0cm, female fetus "[place]"

## 2013-08-07 LAB — GLUCOSE TOLERANCE, 2 HOURS W/ 1HR
Glucose, 1 hour: 151 mg/dL (ref 70–170)
Glucose, 2 hour: 139 mg/dL (ref 70–139)
Glucose, Fasting: 87 mg/dL (ref 70–99)

## 2013-08-07 LAB — HSV 2 ANTIBODY, IGG: HSV 2 Glycoprotein G Ab, IgG: <0.1 IV

## 2013-08-07 LAB — HIV ANTIBODY (ROUTINE TESTING W REFLEX): HIV: NONREACTIVE

## 2013-08-10 ENCOUNTER — Encounter: Payer: Self-pay | Admitting: Women's Health

## 2013-09-03 ENCOUNTER — Ambulatory Visit (INDEPENDENT_AMBULATORY_CARE_PROVIDER_SITE_OTHER): Payer: Medicaid Other | Admitting: Obstetrics & Gynecology

## 2013-09-03 ENCOUNTER — Encounter: Payer: Self-pay | Admitting: Obstetrics & Gynecology

## 2013-09-03 VITALS — BP 100/60 | Wt 354.0 lb

## 2013-09-03 DIAGNOSIS — Z3403 Encounter for supervision of normal first pregnancy, third trimester: Secondary | ICD-10-CM

## 2013-09-03 DIAGNOSIS — Z1389 Encounter for screening for other disorder: Secondary | ICD-10-CM

## 2013-09-03 DIAGNOSIS — E669 Obesity, unspecified: Secondary | ICD-10-CM

## 2013-09-03 DIAGNOSIS — Z331 Pregnant state, incidental: Secondary | ICD-10-CM

## 2013-09-03 DIAGNOSIS — O99891 Other specified diseases and conditions complicating pregnancy: Secondary | ICD-10-CM

## 2013-09-03 DIAGNOSIS — R1900 Intra-abdominal and pelvic swelling, mass and lump, unspecified site: Secondary | ICD-10-CM

## 2013-09-03 DIAGNOSIS — O358XX0 Maternal care for other (suspected) fetal abnormality and damage, not applicable or unspecified: Secondary | ICD-10-CM

## 2013-09-03 DIAGNOSIS — O99019 Anemia complicating pregnancy, unspecified trimester: Secondary | ICD-10-CM

## 2013-09-03 LAB — POCT URINALYSIS DIPSTICK
Ketones, UA: NEGATIVE
Protein, UA: 1

## 2013-09-03 NOTE — Progress Notes (Signed)
BP weight and urine results all reviewed and noted. Patient reports good fetal movement, denies any bleeding and no rupture of membranes symptoms or regular contractions. Patient is without complaints. All questions were answered.  

## 2013-09-03 NOTE — Addendum Note (Signed)
Addended by: Criss Alvine on: 09/03/2013 12:26 PM   Modules accepted: Orders

## 2013-09-17 ENCOUNTER — Ambulatory Visit (INDEPENDENT_AMBULATORY_CARE_PROVIDER_SITE_OTHER): Payer: Medicaid Other | Admitting: Women's Health

## 2013-09-17 ENCOUNTER — Encounter: Payer: Self-pay | Admitting: Women's Health

## 2013-09-17 VITALS — BP 120/80 | Wt 351.6 lb

## 2013-09-17 DIAGNOSIS — O219 Vomiting of pregnancy, unspecified: Secondary | ICD-10-CM

## 2013-09-17 DIAGNOSIS — Z331 Pregnant state, incidental: Secondary | ICD-10-CM

## 2013-09-17 DIAGNOSIS — Z1389 Encounter for screening for other disorder: Secondary | ICD-10-CM

## 2013-09-17 DIAGNOSIS — Z3403 Encounter for supervision of normal first pregnancy, third trimester: Secondary | ICD-10-CM

## 2013-09-17 DIAGNOSIS — O99891 Other specified diseases and conditions complicating pregnancy: Secondary | ICD-10-CM

## 2013-09-17 DIAGNOSIS — O99019 Anemia complicating pregnancy, unspecified trimester: Secondary | ICD-10-CM

## 2013-09-17 DIAGNOSIS — E669 Obesity, unspecified: Secondary | ICD-10-CM

## 2013-09-17 DIAGNOSIS — R1900 Intra-abdominal and pelvic swelling, mass and lump, unspecified site: Secondary | ICD-10-CM

## 2013-09-17 DIAGNOSIS — O358XX0 Maternal care for other (suspected) fetal abnormality and damage, not applicable or unspecified: Secondary | ICD-10-CM

## 2013-09-17 LAB — POCT URINALYSIS DIPSTICK
Blood, UA: NEGATIVE
Glucose, UA: NEGATIVE
Ketones, UA: NEGATIVE
Leukocytes, UA: NEGATIVE
Nitrite, UA: NEGATIVE

## 2013-09-17 MED ORDER — ONDANSETRON 8 MG PO TBDP: 8.0000 mg | ORAL_TABLET | Freq: Three times a day (TID) | ORAL | Status: DC | PRN

## 2013-09-17 NOTE — Progress Notes (Signed)
FH measured from U. Reports good fm. Denies uc's, lof, vb, urinary frequency, urgency, hesitancy, or dysuria.  Pelvic girdle and LBP. Requests refill on zofran. Discussed contraception- desires info on mirena.  Reviewed pn2 results, ptl s/s, fkc.  All questions answered. F/U in 2wks for visit.

## 2013-09-17 NOTE — Patient Instructions (Signed)
Third Trimester of Pregnancy  The third trimester is from week 29 through week 42, months 7 through 9. The third trimester is a time when the fetus is growing rapidly. At the end of the ninth month, the fetus is about 20 inches in length and weighs 6 10 pounds.   BODY CHANGES  Your body goes through many changes during pregnancy. The changes vary from woman to woman.    Your weight will continue to increase. You can expect to gain 25 35 pounds (11 16 kg) by the end of the pregnancy.   You may begin to get stretch marks on your hips, abdomen, and breasts.   You may urinate more often because the fetus is moving lower into your pelvis and pressing on your bladder.   You may develop or continue to have heartburn as a result of your pregnancy.   You may develop constipation because certain hormones are causing the muscles that push waste through your intestines to slow down.   You may develop hemorrhoids or swollen, bulging veins (varicose veins).   You may have pelvic pain because of the weight gain and pregnancy hormones relaxing your joints between the bones in your pelvis. Back aches may result from over exertion of the muscles supporting your posture.   Your breasts will continue to grow and be tender. A yellow discharge may leak from your breasts called colostrum.   Your belly button may stick out.   You may feel short of breath because of your expanding uterus.   You may notice the fetus "dropping," or moving lower in your abdomen.   You may have a bloody mucus discharge. This usually occurs a few days to a week before labor begins.   Your cervix becomes thin and soft (effaced) near your due date.  WHAT TO EXPECT AT YOUR PRENATAL EXAMS   You will have prenatal exams every 2 weeks until week 36. Then, you will have weekly prenatal exams. During a routine prenatal visit:   You will be weighed to make sure you and the fetus are growing normally.   Your blood pressure is taken.   Your abdomen will be  measured to track your baby's growth.   The fetal heartbeat will be listened to.   Any test results from the previous visit will be discussed.   You may have a cervical check near your due date to see if you have effaced.  At around 36 weeks, your caregiver will check your cervix. At the same time, your caregiver will also perform a test on the secretions of the vaginal tissue. This test is to determine if a type of bacteria, Group B streptococcus, is present. Your caregiver will explain this further.  Your caregiver may ask you:   What your birth plan is.   How you are feeling.   If you are feeling the baby move.   If you have had any abnormal symptoms, such as leaking fluid, bleeding, severe headaches, or abdominal cramping.   If you have any questions.  Other tests or screenings that may be performed during your third trimester include:   Blood tests that check for low iron levels (anemia).   Fetal testing to check the health, activity level, and growth of the fetus. Testing is done if you have certain medical conditions or if there are problems during the pregnancy.  FALSE LABOR  You may feel small, irregular contractions that eventually go away. These are called Braxton Hicks contractions, or   false labor. Contractions may last for hours, days, or even weeks before true labor sets in. If contractions come at regular intervals, intensify, or become painful, it is best to be seen by your caregiver.   SIGNS OF LABOR    Menstrual-like cramps.   Contractions that are 5 minutes apart or less.   Contractions that start on the top of the uterus and spread down to the lower abdomen and back.   A sense of increased pelvic pressure or back pain.   A watery or bloody mucus discharge that comes from the vagina.  If you have any of these signs before the 37th week of pregnancy, call your caregiver right away. You need to go to the hospital to get checked immediately.  HOME CARE INSTRUCTIONS    Avoid all  smoking, herbs, alcohol, and unprescribed drugs. These chemicals affect the formation and growth of the baby.   Follow your caregiver's instructions regarding medicine use. There are medicines that are either safe or unsafe to take during pregnancy.   Exercise only as directed by your caregiver. Experiencing uterine cramps is a good sign to stop exercising.   Continue to eat regular, healthy meals.   Wear a good support bra for breast tenderness.   Do not use hot tubs, steam rooms, or saunas.   Wear your seat belt at all times when driving.   Avoid raw meat, uncooked cheese, cat litter boxes, and soil used by cats. These carry germs that can cause birth defects in the baby.   Take your prenatal vitamins.   Try taking a stool softener (if your caregiver approves) if you develop constipation. Eat more high-fiber foods, such as fresh vegetables or fruit and whole grains. Drink plenty of fluids to keep your urine clear or pale yellow.   Take warm sitz baths to soothe any pain or discomfort caused by hemorrhoids. Use hemorrhoid cream if your caregiver approves.   If you develop varicose veins, wear support hose. Elevate your feet for 15 minutes, 3 4 times a day. Limit salt in your diet.   Avoid heavy lifting, wear low heal shoes, and practice good posture.   Rest a lot with your legs elevated if you have leg cramps or low back pain.   Visit your dentist if you have not gone during your pregnancy. Use a soft toothbrush to brush your teeth and be gentle when you floss.   A sexual relationship may be continued unless your caregiver directs you otherwise.   Do not travel far distances unless it is absolutely necessary and only with the approval of your caregiver.   Take prenatal classes to understand, practice, and ask questions about the labor and delivery.   Make a trial run to the hospital.   Pack your hospital bag.   Prepare the baby's nursery.   Continue to go to all your prenatal visits as directed  by your caregiver.  SEEK MEDICAL CARE IF:   You are unsure if you are in labor or if your water has broken.   You have dizziness.   You have mild pelvic cramps, pelvic pressure, or nagging pain in your abdominal area.   You have persistent nausea, vomiting, or diarrhea.   You have a bad smelling vaginal discharge.   You have pain with urination.  SEEK IMMEDIATE MEDICAL CARE IF:    You have a fever.   You are leaking fluid from your vagina.   You have spotting or bleeding from your vagina.     You have severe abdominal cramping or pain.   You have rapid weight loss or gain.   You have shortness of breath with chest pain.   You notice sudden or extreme swelling of your face, hands, ankles, feet, or legs.   You have not felt your baby move in over an hour.   You have severe headaches that do not go away with medicine.   You have vision changes.  Document Released: 09/27/2001 Document Revised: 06/05/2013 Document Reviewed: 12/04/2012  [employer] Patient Information 2014 [employer].

## 2013-10-01 ENCOUNTER — Encounter: Payer: Self-pay | Admitting: Women's Health

## 2013-10-01 ENCOUNTER — Ambulatory Visit (INDEPENDENT_AMBULATORY_CARE_PROVIDER_SITE_OTHER): Payer: Medicaid Other | Admitting: Women's Health

## 2013-10-01 VITALS — BP 128/70 | Wt 357.2 lb

## 2013-10-01 DIAGNOSIS — Z331 Pregnant state, incidental: Secondary | ICD-10-CM

## 2013-10-01 DIAGNOSIS — O358XX0 Maternal care for other (suspected) fetal abnormality and damage, not applicable or unspecified: Secondary | ICD-10-CM

## 2013-10-01 DIAGNOSIS — Z3403 Encounter for supervision of normal first pregnancy, third trimester: Secondary | ICD-10-CM

## 2013-10-01 DIAGNOSIS — R1900 Intra-abdominal and pelvic swelling, mass and lump, unspecified site: Secondary | ICD-10-CM

## 2013-10-01 DIAGNOSIS — O99891 Other specified diseases and conditions complicating pregnancy: Secondary | ICD-10-CM

## 2013-10-01 DIAGNOSIS — O1213 Gestational proteinuria, third trimester: Secondary | ICD-10-CM

## 2013-10-01 DIAGNOSIS — Z1389 Encounter for screening for other disorder: Secondary | ICD-10-CM

## 2013-10-01 DIAGNOSIS — O99019 Anemia complicating pregnancy, unspecified trimester: Secondary | ICD-10-CM

## 2013-10-01 DIAGNOSIS — E669 Obesity, unspecified: Secondary | ICD-10-CM

## 2013-10-01 LAB — POCT URINALYSIS DIPSTICK
Blood, UA: NEGATIVE
Glucose, UA: NEGATIVE
Nitrite, UA: NEGATIVE

## 2013-10-01 NOTE — Progress Notes (Signed)
FH measured from U. Reports good fm. Denies uc's, lof, vb, urinary frequency, urgency, hesitancy, or dysuria.  Some cramping this am that has subsided.  Reviewed ptl s/s, fkc.  All questions answered. F/U in 2wks for visit.

## 2013-10-01 NOTE — Patient Instructions (Signed)
Preterm Labor Information Preterm labor is when labor starts at less than 37 weeks of pregnancy. The normal length of a pregnancy is 39 to 41 weeks. CAUSES Often, there is no identifiable underlying cause as to why a woman goes into preterm labor. One of the most common known causes of preterm labor is infection. Infections of the uterus, cervix, vagina, amniotic sac, bladder, kidney, or even the lungs (pneumonia) can cause labor to start. Other suspected causes of preterm labor include:   Urogenital infections, such as yeast infections and bacterial vaginosis.   Uterine abnormalities (uterine shape, uterine septum, fibroids, or bleeding from the placenta).   A cervix that has been operated on (it may fail to stay closed).   Malformations in the fetus.   Multiple gestations (twins, triplets, and so on).   Breakage of the amniotic sac.  RISK FACTORS  Having a previous history of preterm labor.   Having premature rupture of membranes (PROM).   Having a placenta that covers the opening of the cervix (placenta previa).   Having a placenta that separates from the uterus (placental abruption).   Having a cervix that is too weak to hold the fetus in the uterus (incompetent cervix).   Having too much fluid in the amniotic sac (polyhydramnios).   Taking illegal drugs or smoking while pregnant.   Not gaining enough weight while pregnant.   Being younger than 18 and older than 27 years old.   Having a low socioeconomic status.   Being African American. SYMPTOMS Signs and symptoms of preterm labor include:   Menstrual-like cramps, abdominal pain, or back pain.  Uterine contractions that are regular, as frequent as six in an hour, regardless of their intensity (may be mild or painful).  Contractions that start on the top of the uterus and spread down to the lower abdomen and back.   A sense of increased pelvic pressure.   A watery or bloody mucus discharge that  comes from the vagina.  TREATMENT Depending on the length of the pregnancy and other circumstances, your health care provider may suggest bed rest. If necessary, there are medicines that can be given to stop contractions and to mature the fetal lungs. If labor happens before 34 weeks of pregnancy, a prolonged hospital stay may be recommended. Treatment depends on the condition of both you and the fetus.  WHAT SHOULD YOU DO IF YOU THINK YOU ARE IN PRETERM LABOR? Call your health care provider right away. You will need to go to the hospital to get checked immediately. HOW CAN YOU PREVENT PRETERM LABOR IN FUTURE PREGNANCIES? You should:   Stop smoking if you smoke.  Maintain healthy weight gain and avoid chemicals and drugs that are not necessary.  Be watchful for any type of infection.  Inform your health care provider if you have a known history of preterm labor. Document Released: 12/24/2003 Document Revised: 06/05/2013 Document Reviewed: 11/05/2012 [employer] Patient Information 2014 [employer].    

## 2013-10-03 LAB — URINE CULTURE: Colony Count: 40000

## 2013-10-15 ENCOUNTER — Ambulatory Visit (INDEPENDENT_AMBULATORY_CARE_PROVIDER_SITE_OTHER): Payer: Medicaid Other | Admitting: Obstetrics and Gynecology

## 2013-10-15 VITALS — BP 120/70 | Wt 359.0 lb

## 2013-10-15 DIAGNOSIS — E669 Obesity, unspecified: Secondary | ICD-10-CM

## 2013-10-15 DIAGNOSIS — O26843 Uterine size-date discrepancy, third trimester: Secondary | ICD-10-CM

## 2013-10-15 DIAGNOSIS — O358XX0 Maternal care for other (suspected) fetal abnormality and damage, not applicable or unspecified: Secondary | ICD-10-CM

## 2013-10-15 DIAGNOSIS — O99891 Other specified diseases and conditions complicating pregnancy: Secondary | ICD-10-CM

## 2013-10-15 DIAGNOSIS — Z331 Pregnant state, incidental: Secondary | ICD-10-CM

## 2013-10-15 DIAGNOSIS — O99019 Anemia complicating pregnancy, unspecified trimester: Secondary | ICD-10-CM

## 2013-10-15 DIAGNOSIS — Z1389 Encounter for screening for other disorder: Secondary | ICD-10-CM

## 2013-10-15 DIAGNOSIS — R1900 Intra-abdominal and pelvic swelling, mass and lump, unspecified site: Secondary | ICD-10-CM

## 2013-10-15 LAB — POCT URINALYSIS DIPSTICK
Ketones, UA: NEGATIVE
Protein, UA: 1

## 2013-10-15 NOTE — Progress Notes (Signed)
br feeding encouraged.

## 2013-10-17 NOTE — L&D Delivery Note (Signed)
Delivery Note At 3:55 PM a viable female was delivered via Vaginal, Spontaneous Delivery (Presentation: ; Occiput Anterior).  APGAR: 8, 9; weight 8 lb 6.9 oz (3825 g).   Placenta status: Intact, Spontaneous.  Cord: 3 vessels with the following complications: None.    Anesthesia: Epidural  Episiotomy: None Lacerations: 2nd degree Suture Repair: 3.0 monocryl Est. Blood Loss (mL): 450  Mom to postpartum.  Baby to Couplet care / Skin to Skin.  NSVD of female infant over partial 3rd degree. Active management of 3rd stage of labor with pit and traction. Reinforced the sphincter sheath w/ 3.0 monocryl. Repaired 2nd degree in usual fashion. Hemostatic and counts correct. EBL 450.  Fredrik Rigger 11/03/2013, 5:47 PM

## 2013-10-22 ENCOUNTER — Ambulatory Visit (INDEPENDENT_AMBULATORY_CARE_PROVIDER_SITE_OTHER): Payer: Medicaid Other | Admitting: Women's Health

## 2013-10-22 ENCOUNTER — Other Ambulatory Visit: Payer: Self-pay | Admitting: Obstetrics and Gynecology

## 2013-10-22 ENCOUNTER — Encounter: Payer: Self-pay | Admitting: Women's Health

## 2013-10-22 ENCOUNTER — Encounter (INDEPENDENT_AMBULATORY_CARE_PROVIDER_SITE_OTHER): Payer: Self-pay

## 2013-10-22 ENCOUNTER — Ambulatory Visit (INDEPENDENT_AMBULATORY_CARE_PROVIDER_SITE_OTHER): Payer: Medicaid Other

## 2013-10-22 VITALS — BP 140/80 | Wt 371.4 lb

## 2013-10-22 DIAGNOSIS — O99019 Anemia complicating pregnancy, unspecified trimester: Secondary | ICD-10-CM

## 2013-10-22 DIAGNOSIS — O1210 Gestational proteinuria, unspecified trimester: Secondary | ICD-10-CM

## 2013-10-22 DIAGNOSIS — O99891 Other specified diseases and conditions complicating pregnancy: Secondary | ICD-10-CM

## 2013-10-22 DIAGNOSIS — N9489 Other specified conditions associated with female genital organs and menstrual cycle: Secondary | ICD-10-CM

## 2013-10-22 DIAGNOSIS — IMO0002 Reserved for concepts with insufficient information to code with codable children: Secondary | ICD-10-CM

## 2013-10-22 DIAGNOSIS — O358XX0 Maternal care for other (suspected) fetal abnormality and damage, not applicable or unspecified: Secondary | ICD-10-CM

## 2013-10-22 DIAGNOSIS — Z1389 Encounter for screening for other disorder: Secondary | ICD-10-CM

## 2013-10-22 DIAGNOSIS — O26849 Uterine size-date discrepancy, unspecified trimester: Secondary | ICD-10-CM

## 2013-10-22 DIAGNOSIS — E669 Obesity, unspecified: Secondary | ICD-10-CM

## 2013-10-22 DIAGNOSIS — O9989 Other specified diseases and conditions complicating pregnancy, childbirth and the puerperium: Secondary | ICD-10-CM

## 2013-10-22 DIAGNOSIS — R1900 Intra-abdominal and pelvic swelling, mass and lump, unspecified site: Secondary | ICD-10-CM

## 2013-10-22 DIAGNOSIS — Z3403 Encounter for supervision of normal first pregnancy, third trimester: Secondary | ICD-10-CM

## 2013-10-22 DIAGNOSIS — O99210 Obesity complicating pregnancy, unspecified trimester: Secondary | ICD-10-CM

## 2013-10-22 DIAGNOSIS — Z331 Pregnant state, incidental: Secondary | ICD-10-CM

## 2013-10-22 LAB — CBC
HCT: 29.5 % — ABNORMAL LOW (ref 36.0–46.0)
Hemoglobin: 9.8 g/dL — ABNORMAL LOW (ref 12.0–15.0)
MCH: 26.6 pg (ref 26.0–34.0)
MCHC: 33.2 g/dL (ref 30.0–36.0)
MCV: 79.9 fL (ref 78.0–100.0)
PLATELETS: 180 10*3/uL (ref 150–400)
RBC: 3.69 MIL/uL — AB (ref 3.87–5.11)
RDW: 14.7 % (ref 11.5–15.5)
WBC: 12.3 10*3/uL — ABNORMAL HIGH (ref 4.0–10.5)

## 2013-10-22 LAB — POCT URINALYSIS DIPSTICK
Glucose, UA: NEGATIVE
KETONES UA: NEGATIVE
Leukocytes, UA: NEGATIVE
Nitrite, UA: NEGATIVE
RBC UA: NEGATIVE

## 2013-10-22 LAB — OB RESULTS CONSOLE GC/CHLAMYDIA
Chlamydia: NEGATIVE
Gonorrhea: NEGATIVE

## 2013-10-22 NOTE — Progress Notes (Signed)
FH measured from U. Reports good fm. Denies regular uc's, lof, vb, urinary frequency, urgency, hesitancy, or dysuria.  BP slightly elevated today. Denies ha, scotomata, ruq/epigastric pain, n/v. DTRs 2+, no clonus, no edema. Has had persistent 1+proteinuria w/ neg urine cx since 30wks, will get P:C ratio baseline and CBC, CMP.   Today's u/s, pelvic mass stable- plans to remove 6-8wk pp or if has to have c/s for any reason. Reviewed today's u/s, pre-e s/s, labor s/s, fkc.  All questions answered. F/U in 3d for bp check and visit.

## 2013-10-22 NOTE — Patient Instructions (Signed)
Call office or go to [facility] hospital for headache not relieved by tylenol, visual changes- seeing spots, double, blurred vision, pain under your right breast or heartburn that doesn't go away with tums, etc., nausea and/or vomiting, or other concerns.    Braxton Hicks Contractions Pregnancy is commonly associated with contractions of the uterus throughout the pregnancy. Towards the end of pregnancy (32 to 34 weeks), these contractions [facility] Ishmael Holter) can develop more often and may become more forceful. This is not true labor because these contractions do not result in opening (dilatation) and thinning of the cervix. They are sometimes difficult to tell apart from true labor because these contractions can be forceful and people have different pain tolerances. You should not feel embarrassed if you go to the hospital with false labor. Sometimes, the only way to tell if you are in true labor is for your caregiver to follow the changes in the cervix. How to tell the difference between true and false labor:  False labor.  The contractions of false labor are usually shorter, irregular and not as hard as those of true labor.  They are often felt in the front of the lower abdomen and in the groin.  They may leave with walking around or changing positions while lying down.  They get weaker and are shorter lasting as time goes on.  These contractions are usually irregular.  They do not usually become progressively stronger, regular and closer together as with true labor.  True labor.  Contractions in true labor last 30 to 70 seconds, become very regular, usually become more intense, and increase in frequency.  They do not go away with walking.  The discomfort is usually felt in the top of the uterus and spreads to the lower abdomen and low back.  True labor can be determined by your caregiver with an exam. This will show that the cervix is dilating and getting thinner. If there are no prenatal  problems or other health problems associated with the pregnancy, it is completely safe to be sent home with false labor and await the onset of true labor. HOME CARE INSTRUCTIONS   Keep up with your usual exercises and instructions.  Take medications as directed.  Keep your regular prenatal appointment.  Eat and drink lightly if you think you are going into labor.  If BH contractions are making you uncomfortable:  Change your activity position from lying down or resting to walking/walking to resting.  Sit and rest in a tub of warm water.  Drink 2 to 3 glasses of water. Dehydration may cause B-H contractions.  Do slow and deep breathing several times an hour. SEEK IMMEDIATE MEDICAL CARE IF:   Your contractions continue to become stronger, more regular, and closer together.  You have a gushing, burst or leaking of fluid from the vagina.  An oral temperature above 102 F (38.9 C) develops.  You have passage of blood-tinged mucus.  You develop vaginal bleeding.  You develop continuous belly (abdominal) pain.  You have low back pain that you never had before.  You feel the baby's head pushing down causing pelvic pressure.  The baby is not moving as much as it used to. Document Released: 10/03/2005 Document Revised: 12/26/2011 Document Reviewed: 03/27/2009 [facility] Patient Information 2014 [place].  Preeclampsia and Eclampsia Preeclampsia is a condition of high blood pressure during pregnancy. It can happen at 20 weeks or later in pregnancy. If high blood pressure occurs in the second half of pregnancy with no  other symptoms, it is called gestational hypertension and goes away after the baby is born. If any of the symptoms listed below develop with gestational hypertension, it is then called preeclampsia. Eclampsia (convulsions) may follow preeclampsia. This is one of the reasons for regular prenatal checkups. Early diagnosis and treatment are very important to prevent  eclampsia. CAUSES  There is no known cause of preeclampsia/eclampsia in pregnancy. There are several known conditions that may put the pregnant woman at risk, such as:  The first pregnancy.  Having preeclampsia in a past pregnancy.  Having lasting (chronic) high blood pressure.  Having multiples (twins, triplets).  Being age 28 or older.  African American ethnic background.  Having kidney disease or diabetes.  Medical conditions such as lupus or blood diseases.  Being overweight (obese). SYMPTOMS   High blood pressure.  Headaches.  Sudden weight gain.  Swelling of hands, face, legs, and feet.  Protein in the urine.  Feeling sick to your stomach (nauseous) and throwing up (vomiting).  Vision problems (blurred or double vision).  Numbness in the face, arms, legs, and feet.  Dizziness.  Slurred speech.  Preeclampsia can cause growth retardation in the fetus.  Separation (abruption) of the placenta.  Not enough fluid in the amniotic sac (oligohydramnios).  Sensitivity to bright lights.  Belly (abdominal) pain. DIAGNOSIS  If protein is found in the urine in the second half of pregnancy, this is considered preeclampsia. Other symptoms mentioned above may also be present. TREATMENT  It is necessary to treat this.  Your caregiver may prescribe bed rest early in this condition. Plenty of rest and salt restriction may be all that is needed.  Medicines may be necessary to lower blood pressure if the condition does not respond to more conservative measures.  In more severe cases, hospitalization may be needed:  For treatment of blood pressure.  To control fluid retention.  To monitor the baby to see if the condition is causing harm to the baby.  Hospitalization is the best way to treat the first sign of preeclampsia. This is so the mother and baby can be watched closely and blood tests can be done effectively and correctly.  If the condition becomes severe,  it may be necessary to induce labor or to remove the infant by surgical means (cesarean section). The best cure for preeclampsia/eclampsia is to deliver the baby. Preeclampsia and eclampsia involve risks to mother and infant. Your caregiver will discuss these risks with you. Together, you can work out the best possible approach to your problems. Make sure you keep your prenatal visits as scheduled. Not keeping appointments could result in a chronic or permanent injury, pain, disability to you, and death or injury to you or your unborn baby. If there is any problem keeping the appointment, you must call to reschedule. HOME CARE INSTRUCTIONS   Keep your prenatal appointments and tests as scheduled.  Tell your caregiver if you have any of the above risk factors.  Get plenty of rest and sleep.  Eat a balanced diet that is low in salt, and do not add salt to your food.  Avoid stressful situations.  Only take over-the-counter and prescriptions medicines for pain, discomfort, or fever as directed by your caregiver. SEEK IMMEDIATE MEDICAL CARE IF:   You develop severe swelling anywhere in the body. This usually occurs in the legs.  You gain 05 lb/2.3 kg or more in a week.  You develop a severe headache, dizziness, problems with your vision, or confusion.  You have abdominal pain, nausea, or vomiting.  You have a seizure.  You have trouble moving any part of your body, or you develop numbness or problems speaking.  You have bruising or abnormal bleeding from anywhere in the body.  You develop a stiff neck.  You pass out. MAKE SURE YOU:   Understand these instructions.  Will watch your condition.  Will get help right away if you are not doing well or get worse. Document Released: 09/30/2000 Document Revised: 12/26/2011 Document Reviewed: 05/16/2008 [facility] Patient Information 2014 [place].

## 2013-10-22 NOTE — Progress Notes (Signed)
U/S(37+0wks)-vtx active fetus, female fetus "[name]", FHR-157bpm, posterior Gr 2 placenta, fluid wnl AFI-8cm, EFW 7 lb 6 oz(73rd%tile), Rt adnexal cyst remains(=20.5 x 13.8 x 12.0cm)

## 2013-10-23 ENCOUNTER — Telehealth: Payer: Self-pay | Admitting: *Deleted

## 2013-10-23 ENCOUNTER — Other Ambulatory Visit: Payer: Self-pay | Admitting: Women's Health

## 2013-10-23 ENCOUNTER — Encounter: Payer: Self-pay | Admitting: Women's Health

## 2013-10-23 DIAGNOSIS — O99013 Anemia complicating pregnancy, third trimester: Secondary | ICD-10-CM

## 2013-10-23 LAB — COMPREHENSIVE METABOLIC PANEL
ALBUMIN: 3 g/dL — AB (ref 3.5–5.2)
AST: 10 U/L (ref 0–37)
Alkaline Phosphatase: 116 U/L (ref 39–117)
BILIRUBIN TOTAL: 0.3 mg/dL (ref 0.3–1.2)
BUN: 4 mg/dL — ABNORMAL LOW (ref 6–23)
CO2: 24 meq/L (ref 19–32)
Calcium: 8.6 mg/dL (ref 8.4–10.5)
Chloride: 102 mEq/L (ref 96–112)
Creat: 0.44 mg/dL — ABNORMAL LOW (ref 0.50–1.10)
GLUCOSE: 77 mg/dL (ref 70–99)
POTASSIUM: 4 meq/L (ref 3.5–5.3)
SODIUM: 135 meq/L (ref 135–145)
TOTAL PROTEIN: 6.6 g/dL (ref 6.0–8.3)

## 2013-10-23 LAB — PROTEIN / CREATININE RATIO, URINE
Creatinine, Urine: 284.4 mg/dL
Protein Creatinine Ratio: 0.23 — ABNORMAL HIGH (ref <0.15)
TOTAL PROTEIN, URINE: 65 mg/dL

## 2013-10-23 LAB — GC/CHLAMYDIA PROBE AMP
CT PROBE, AMP APTIMA: NEGATIVE
GC PROBE AMP APTIMA: NEGATIVE

## 2013-10-23 MED ORDER — FERROUS SULFATE 325 (65 FE) MG PO TABS: 325.0000 mg | ORAL_TABLET | Freq: Two times a day (BID) | ORAL | Status: DC

## 2013-10-23 NOTE — Telephone Encounter (Signed)
Spoke with pt letting her know labs show she is anemic and an iron Rx has been sent to her pharmacy. Also advised to eat iron rich foods like red meat, green leafy veg, beans, and dried fruits. Also advised pre-eclampsia labs were normal. Pt voiced understanding. [place]

## 2013-10-24 LAB — STREP B DNA PROBE: GBSP: POSITIVE

## 2013-10-25 ENCOUNTER — Encounter: Payer: Self-pay | Admitting: Obstetrics & Gynecology

## 2013-10-25 ENCOUNTER — Ambulatory Visit (INDEPENDENT_AMBULATORY_CARE_PROVIDER_SITE_OTHER): Payer: Medicaid Other | Admitting: Obstetrics & Gynecology

## 2013-10-25 VITALS — BP 130/80 | Wt 370.0 lb

## 2013-10-25 DIAGNOSIS — O358XX0 Maternal care for other (suspected) fetal abnormality and damage, not applicable or unspecified: Secondary | ICD-10-CM

## 2013-10-25 DIAGNOSIS — O9989 Other specified diseases and conditions complicating pregnancy, childbirth and the puerperium: Secondary | ICD-10-CM

## 2013-10-25 DIAGNOSIS — O99019 Anemia complicating pregnancy, unspecified trimester: Secondary | ICD-10-CM

## 2013-10-25 DIAGNOSIS — R1900 Intra-abdominal and pelvic swelling, mass and lump, unspecified site: Secondary | ICD-10-CM

## 2013-10-25 DIAGNOSIS — Z3403 Encounter for supervision of normal first pregnancy, third trimester: Secondary | ICD-10-CM

## 2013-10-25 DIAGNOSIS — O99891 Other specified diseases and conditions complicating pregnancy: Secondary | ICD-10-CM

## 2013-10-25 DIAGNOSIS — E669 Obesity, unspecified: Secondary | ICD-10-CM

## 2013-10-25 DIAGNOSIS — Z331 Pregnant state, incidental: Secondary | ICD-10-CM

## 2013-10-25 DIAGNOSIS — Z1389 Encounter for screening for other disorder: Secondary | ICD-10-CM

## 2013-10-25 DIAGNOSIS — O99210 Obesity complicating pregnancy, unspecified trimester: Secondary | ICD-10-CM

## 2013-10-25 LAB — POCT URINALYSIS DIPSTICK
Glucose, UA: NEGATIVE
KETONES UA: NEGATIVE
Nitrite, UA: NEGATIVE
RBC UA: NEGATIVE

## 2013-10-25 MED ORDER — ZOLPIDEM TARTRATE 10 MG PO TABS: 10.0000 mg | ORAL_TABLET | Freq: Every evening | ORAL | Status: DC | PRN

## 2013-10-25 NOTE — Progress Notes (Signed)
Umbilicus + 32 cm BP weight and urine results all reviewed and noted. Patient reports good fetal movement, denies any bleeding and no rupture of membranes symptoms or regular contractions. Patient is without complaints. All questions were answered.

## 2013-10-28 ENCOUNTER — Encounter: Payer: Self-pay | Admitting: Women's Health

## 2013-10-31 ENCOUNTER — Ambulatory Visit (INDEPENDENT_AMBULATORY_CARE_PROVIDER_SITE_OTHER): Payer: Medicaid Other | Admitting: Obstetrics & Gynecology

## 2013-10-31 ENCOUNTER — Other Ambulatory Visit: Payer: Self-pay | Admitting: Obstetrics & Gynecology

## 2013-10-31 ENCOUNTER — Encounter: Payer: Self-pay | Admitting: Obstetrics & Gynecology

## 2013-10-31 VITALS — BP 150/80 | Wt 377.8 lb

## 2013-10-31 DIAGNOSIS — E669 Obesity, unspecified: Secondary | ICD-10-CM

## 2013-10-31 DIAGNOSIS — O99210 Obesity complicating pregnancy, unspecified trimester: Secondary | ICD-10-CM

## 2013-10-31 DIAGNOSIS — O99891 Other specified diseases and conditions complicating pregnancy: Secondary | ICD-10-CM

## 2013-10-31 DIAGNOSIS — Z1389 Encounter for screening for other disorder: Secondary | ICD-10-CM

## 2013-10-31 DIAGNOSIS — O358XX0 Maternal care for other (suspected) fetal abnormality and damage, not applicable or unspecified: Secondary | ICD-10-CM

## 2013-10-31 DIAGNOSIS — R1900 Intra-abdominal and pelvic swelling, mass and lump, unspecified site: Secondary | ICD-10-CM

## 2013-10-31 DIAGNOSIS — Z331 Pregnant state, incidental: Secondary | ICD-10-CM

## 2013-10-31 DIAGNOSIS — O99019 Anemia complicating pregnancy, unspecified trimester: Secondary | ICD-10-CM

## 2013-10-31 DIAGNOSIS — IMO0002 Reserved for concepts with insufficient information to code with codable children: Secondary | ICD-10-CM

## 2013-10-31 DIAGNOSIS — O9989 Other specified diseases and conditions complicating pregnancy, childbirth and the puerperium: Secondary | ICD-10-CM

## 2013-10-31 LAB — POCT URINALYSIS DIPSTICK
Blood, UA: NEGATIVE
Glucose, UA: NEGATIVE
KETONES UA: NEGATIVE
LEUKOCYTES UA: NEGATIVE
Nitrite, UA: NEGATIVE
Protein, UA: 3

## 2013-10-31 NOTE — Progress Notes (Signed)
U+29 Having trouble sleeping Sleep apnea BP weight and urine results all reviewed and noted. Patient reports good fetal movement, denies any bleeding and no rupture of membranes symptoms or regular contractions. Patient is without complaints. All questions were answered. Protein elevated, will re check protein/creatinine ratio If elevated will proceed with delivery

## 2013-11-01 ENCOUNTER — Encounter (HOSPITAL_COMMUNITY): Payer: Self-pay

## 2013-11-01 ENCOUNTER — Inpatient Hospital Stay (HOSPITAL_COMMUNITY)
Admission: AD | Admit: 2013-11-01 | Discharge: 2013-11-04 | DRG: 774 | Disposition: A | Discharge status: Home / Self Care | Payer: Medicaid Other | Source: Ambulatory Visit | Attending: Obstetrics & Gynecology | Admitting: Obstetrics & Gynecology

## 2013-11-01 ENCOUNTER — Telehealth: Payer: Self-pay | Admitting: Adult Health

## 2013-11-01 DIAGNOSIS — IMO0002 Reserved for concepts with insufficient information to code with codable children: Principal | ICD-10-CM | POA: Diagnosis present

## 2013-11-01 DIAGNOSIS — E669 Obesity, unspecified: Secondary | ICD-10-CM | POA: Diagnosis present

## 2013-11-01 DIAGNOSIS — Z22330 Carrier of Group B streptococcus: Secondary | ICD-10-CM

## 2013-11-01 DIAGNOSIS — Z87891 Personal history of nicotine dependence: Secondary | ICD-10-CM

## 2013-11-01 DIAGNOSIS — O9989 Other specified diseases and conditions complicating pregnancy, childbirth and the puerperium: Secondary | ICD-10-CM

## 2013-11-01 DIAGNOSIS — O99214 Obesity complicating childbirth: Secondary | ICD-10-CM

## 2013-11-01 DIAGNOSIS — O99892 Other specified diseases and conditions complicating childbirth: Secondary | ICD-10-CM | POA: Diagnosis present

## 2013-11-01 DIAGNOSIS — O1490 Unspecified pre-eclampsia, unspecified trimester: Secondary | ICD-10-CM | POA: Diagnosis present

## 2013-11-01 DIAGNOSIS — Z3400 Encounter for supervision of normal first pregnancy, unspecified trimester: Secondary | ICD-10-CM

## 2013-11-01 LAB — CBC
HCT: 28.2 % — ABNORMAL LOW (ref 36.0–46.0)
HEMOGLOBIN: 9.3 g/dL — AB (ref 12.0–15.0)
MCH: 26.4 pg (ref 26.0–34.0)
MCHC: 33 g/dL (ref 30.0–36.0)
MCV: 80.1 fL (ref 78.0–100.0)
Platelets: 199 10*3/uL (ref 150–400)
RBC: 3.52 MIL/uL — ABNORMAL LOW (ref 3.87–5.11)
RDW: 14.8 % (ref 11.5–15.5)
WBC: 11.9 10*3/uL — ABNORMAL HIGH (ref 4.0–10.5)

## 2013-11-01 LAB — COMPREHENSIVE METABOLIC PANEL
ALT: 7 U/L (ref 0–35)
AST: 10 U/L (ref 0–37)
Albumin: 2.3 g/dL — ABNORMAL LOW (ref 3.5–5.2)
Alkaline Phosphatase: 119 U/L — ABNORMAL HIGH (ref 39–117)
BUN: 3 mg/dL — AB (ref 6–23)
CALCIUM: 9.2 mg/dL (ref 8.4–10.5)
CO2: 22 meq/L (ref 19–32)
CREATININE: 0.58 mg/dL (ref 0.50–1.10)
Chloride: 103 mEq/L (ref 96–112)
Glucose, Bld: 80 mg/dL (ref 70–99)
Potassium: 4.1 mEq/L (ref 3.7–5.3)
Sodium: 137 mEq/L (ref 137–147)
Total Bilirubin: 0.3 mg/dL (ref 0.3–1.2)
Total Protein: 6.5 g/dL (ref 6.0–8.3)

## 2013-11-01 LAB — OTHER SOLSTAS TEST
CREATININE, URINE: 249.4 mg/dL
PROTEIN/CREAT RATIO: 339 — AB (ref 0–125)
TOTAL PROTEIN, URINE: 75 mg/dL

## 2013-11-01 LAB — PREPARE RBC (CROSSMATCH)

## 2013-11-01 LAB — RPR: RPR: NONREACTIVE

## 2013-11-01 LAB — ABO/RH: ABO/RH(D): O POS

## 2013-11-01 MED ORDER — MISOPROSTOL 200 MCG PO TABS
50.0000 ug | ORAL_TABLET | ORAL | Status: DC | PRN
  Administered 2013-11-01 – 2013-11-02 (×3): 50 ug via ORAL
  Filled 2013-11-01 (×3): qty 0.5

## 2013-11-01 MED ORDER — EPHEDRINE 5 MG/ML INJ
10.0000 mg | INTRAVENOUS | Status: DC | PRN
  Filled 2013-11-01: qty 2

## 2013-11-01 MED ORDER — LACTATED RINGERS IV SOLN
INTRAVENOUS | Status: DC
  Administered 2013-11-01 – 2013-11-03 (×3): via INTRAVENOUS
  Administered 2013-11-03: 125 mL/h via INTRAVENOUS

## 2013-11-01 MED ORDER — OXYCODONE-ACETAMINOPHEN 5-325 MG PO TABS
1.0000 | ORAL_TABLET | ORAL | Status: DC | PRN
  Administered 2013-11-03: 1 via ORAL
  Filled 2013-11-01: qty 1

## 2013-11-01 MED ORDER — OXYTOCIN BOLUS FROM INFUSION: 500.0000 mL | INTRAVENOUS | Status: DC

## 2013-11-01 MED ORDER — DIPHENHYDRAMINE HCL 50 MG/ML IJ SOLN
50.0000 mg | Freq: Once | INTRAMUSCULAR | Status: AC
  Administered 2013-11-01: 50 mg via INTRAVENOUS
  Filled 2013-11-01: qty 1

## 2013-11-01 MED ORDER — ONDANSETRON HCL 4 MG/2ML IJ SOLN: 4.0000 mg | Freq: Four times a day (QID) | INTRAMUSCULAR | Status: DC | PRN

## 2013-11-01 MED ORDER — FENTANYL 2.5 MCG/ML BUPIVACAINE 1/10 % EPIDURAL INFUSION (WH - ANES)
14.0000 mL/h | INTRAMUSCULAR | Status: DC | PRN
  Administered 2013-11-03 (×2): 14 mL/h via EPIDURAL
  Filled 2013-11-01 (×2): qty 125

## 2013-11-01 MED ORDER — OXYTOCIN 40 UNITS IN LACTATED RINGERS INFUSION - SIMPLE MED: 62.5000 mL/h | INTRAVENOUS | Status: DC

## 2013-11-01 MED ORDER — CITRIC ACID-SODIUM CITRATE 334-500 MG/5ML PO SOLN: 30.0000 mL | ORAL | Status: DC | PRN

## 2013-11-01 MED ORDER — PHENYLEPHRINE 40 MCG/ML (10ML) SYRINGE FOR IV PUSH (FOR BLOOD PRESSURE SUPPORT)
80.0000 ug | PREFILLED_SYRINGE | INTRAVENOUS | Status: DC | PRN
  Filled 2013-11-01: qty 2
  Filled 2013-11-01: qty 10

## 2013-11-01 MED ORDER — DIPHENHYDRAMINE HCL 50 MG/ML IJ SOLN: 12.5000 mg | INTRAMUSCULAR | Status: DC | PRN

## 2013-11-01 MED ORDER — LACTATED RINGERS IV SOLN
500.0000 mL | Freq: Once | INTRAVENOUS | Status: AC
  Administered 2013-11-03: 500 mL via INTRAVENOUS

## 2013-11-01 MED ORDER — PHENYLEPHRINE 40 MCG/ML (10ML) SYRINGE FOR IV PUSH (FOR BLOOD PRESSURE SUPPORT)
80.0000 ug | PREFILLED_SYRINGE | INTRAVENOUS | Status: DC | PRN
  Filled 2013-11-01: qty 2

## 2013-11-01 MED ORDER — LIDOCAINE HCL (PF) 1 % IJ SOLN
30.0000 mL | INTRAMUSCULAR | Status: DC | PRN
  Administered 2013-11-03: 30 mL via SUBCUTANEOUS
  Filled 2013-11-01 (×2): qty 30

## 2013-11-01 MED ORDER — ACETAMINOPHEN 325 MG PO TABS: 650.0000 mg | ORAL_TABLET | ORAL | Status: DC | PRN

## 2013-11-01 MED ORDER — TERBUTALINE SULFATE 1 MG/ML IJ SOLN: 0.2500 mg | Freq: Once | INTRAMUSCULAR | Status: AC | PRN

## 2013-11-01 MED ORDER — FENTANYL CITRATE 0.05 MG/ML IJ SOLN
100.0000 ug | INTRAMUSCULAR | Status: DC | PRN
  Administered 2013-11-02 (×8): 100 ug via INTRAVENOUS
  Filled 2013-11-01 (×8): qty 2

## 2013-11-01 MED ORDER — IBUPROFEN 600 MG PO TABS
600.0000 mg | ORAL_TABLET | Freq: Four times a day (QID) | ORAL | Status: DC | PRN
  Administered 2013-11-03: 600 mg via ORAL
  Filled 2013-11-01: qty 1

## 2013-11-01 MED ORDER — EPHEDRINE 5 MG/ML INJ
10.0000 mg | INTRAVENOUS | Status: DC | PRN
  Filled 2013-11-01: qty 4
  Filled 2013-11-01: qty 2

## 2013-11-01 MED ORDER — LACTATED RINGERS IV SOLN
500.0000 mL | INTRAVENOUS | Status: DC | PRN
  Administered 2013-11-02: 500 mL via INTRAVENOUS

## 2013-11-01 NOTE — Progress Notes (Signed)
Pt reports sob, dizziness and "not feeling well"...

## 2013-11-01 NOTE — H&P (Signed)
Judy Nelson is a 27 y.o. female, G1P0 at [redacted]w[redacted]d presenting for IOL due to preeclampsia.  Pt denies HA, blurred vision and abdominal pain.   Maternal Medical History:  Fetal activity: Perceived fetal activity is normal.   Last perceived fetal movement was within the past 12 hours.    Prenatal complications: Pre-eclampsia.     OB History   Grav Para Term Preterm Abortions TAB SAB Ect Mult Living   1              Past Medical History  Diagnosis Date  . Ovarian cyst   . Obesity    Past Surgical History  Procedure Laterality Date  . No past surgeries     Family History: family history is not on file. Social History:  reports that she has quit smoking. Her smoking use included Cigarettes. She smoked 0.00 packs per day. She has never used smokeless tobacco. She reports that she does not drink alcohol or use illicit drugs.   Prenatal Transfer Tool  Maternal Diabetes: No Genetic Screening: Normal Maternal Ultrasounds/Referrals: Normal Fetal Ultrasounds or other Referrals:  None Maternal Substance Abuse:  No Significant Maternal Medications:  None Significant Maternal Lab Results:  Lab values include: Other: Elevated P/C ratio. Other Comments:  N/A  ROS    Temperature 98.2 F (36.8 C), temperature source Oral, resp. rate 20, height 5\' 8"  (1.727 m), weight 171.006 kg (377 lb).  Maternal Exam:  Introitus: Normal vulva. Normal vagina.  Vagina is negative for discharge.  Amniotic fluid character: not assessed.  Pelvis: adequate for delivery.   Cervix: Cervix evaluated by digital exam.     Fetal Exam Fetal Monitor Review: Mode: ultrasound.   Baseline rate: 150-160.  Variability: moderate (6-25 bpm).   Pattern: no accelerations and no decelerations.      Toco: None  Physical Exam  Constitutional: She appears well-developed. No distress.  Morbidly obese  HENT:  Head: Normocephalic.  GI: There is no tenderness. There is no rebound and no guarding.  Genitourinary:  Vagina normal. No vaginal discharge found.  Cervical exam:  Fingertip, long and firm.  VTX (confirmed with [place])  Neurological: She is alert.    Prenatal labs: ABO, Rh: O/POS/-- (06/30 1647) Antibody: NEG (10/21 0930) Rubella: 1.98 (06/30 1647) RPR: NON REAC (10/21 0930)  HBsAg: NEGATIVE (06/30 1647)  HIV: NON REACTIVE (10/21 0930)  GBS: POSITIVE (01/06 1249)    [facility]  Pap neg  Genetic Screen NT/IT: declined  Anatomic [place] Normal female w/ app growth @ 26wks 'Judy Nelson'  Glucose Screen 2hr normal: 87/151/139  Flu vaccine 07/16/13  CF Screen declined  GC/CT Initial: -/-          36+wks:   -/-  GBS Pos  Feed Preference breast  Contraception mirena  Circumcision n/a  Childbirth Classes offered  Pediatrician undecided   CMP     Component Value Date/Time   NA 135 10/22/2013 1247   K 4.0 10/22/2013 1247   CL 102 10/22/2013 1247   CO2 24 10/22/2013 1247   GLUCOSE 77 10/22/2013 1247   BUN 4* 10/22/2013 1247   CREATININE 0.44* 10/22/2013 1247   CALCIUM 8.6 10/22/2013 1247   PROT 6.6 10/22/2013 1247   ALBUMIN 3.0* 10/22/2013 1247   AST 10 10/22/2013 1247   ALT <8 10/22/2013 1247   ALKPHOS 116 10/22/2013 1247   BILITOT 0.3 10/22/2013 1247    CBC    Component Value Date/Time   WBC 12.3* 10/22/2013 1247   RBC 3.69*  10/22/2013 1247   HGB 9.8* 10/22/2013 1247   HCT 29.5* 10/22/2013 1247   PLT 180 10/22/2013 1247   MCV 79.9 10/22/2013 1247   MCH 26.6 10/22/2013 1247   MCHC 33.2 10/22/2013 1247   RDW 14.7 10/22/2013 1247   Pro: Cr 0.34   Assessment/Plan:  Assessment:  1. Induction of Labor for preeclampsia  Plan: #Cytotec PO #Toco and FH monitoring #Planning on epidural for pain  Judy Nelson, Judy Nelson 11/01/2013, 1:33 PM  Judy Nelson is a 28 y.o. G1P0 at [redacted]w[redacted]d here for IOL for PreX #Labor: Pt with bishop of 0, start with cytotec 36mcg PO q4hr #Pain: Desires Iv pain meds and epidural #FWB: Cat I, VTX by [place] #ID:  GBS +, start PCN when favorable #MOF: Breast #MOC: Mirena #Prex: meets criteria  based on 2 BP>6 hrs apart with Pr:Cr >0.3. Will induce given GA and PreX. Mag for severe features.  I spoke with and examined patient and agree with PA-S's note and plan of care.  Fredrik Rigger, MD Ob Fellow 11/01/2013 3:07 PM

## 2013-11-01 NOTE — H&P (Signed)
Attestation of Attending Supervision of Obstetric Fellow: Evaluation and management procedures were performed by the Obstetric Fellow under my supervision and collaboration.  I have reviewed the Obstetric Fellow's note and chart, and I agree with the management and plan.  UGONNA  ANYANWU, MD, FACOG Attending Obstetrician & Gynecologist Faculty Practice, [facility]   

## 2013-11-01 NOTE — Telephone Encounter (Signed)
Called pt told her P/C index was 339, and that she needs to go to [facility] for induction per Dr Elonda Husky, Dr Glo Herring said he would call and notify MAU.

## 2013-11-02 LAB — CBC
HEMATOCRIT: 28.3 % — AB (ref 36.0–46.0)
Hemoglobin: 9.3 g/dL — ABNORMAL LOW (ref 12.0–15.0)
MCH: 26.5 pg (ref 26.0–34.0)
MCHC: 32.9 g/dL (ref 30.0–36.0)
MCV: 80.6 fL (ref 78.0–100.0)
Platelets: 199 10*3/uL (ref 150–400)
RBC: 3.51 MIL/uL — AB (ref 3.87–5.11)
RDW: 15 % (ref 11.5–15.5)
WBC: 12.6 10*3/uL — AB (ref 4.0–10.5)

## 2013-11-02 MED ORDER — DIPHENHYDRAMINE HCL 50 MG/ML IJ SOLN
50.0000 mg | Freq: Once | INTRAMUSCULAR | Status: AC
  Administered 2013-11-02: 50 mg via INTRAVENOUS
  Filled 2013-11-02: qty 1

## 2013-11-02 NOTE — Progress Notes (Signed)
Patient ID: Judy Nelson, female   DOB: 01-Jul-1986, 28 y.o.   MRN: [id number] Judy Nelson is a 28 y.o. G1P0 at [redacted]w[redacted]d  admitted for induction of labor due to Pre-eclampsia.  Subjective: Discouraged, tired, uncomfortable w/ uc's. Denies ha, scotomata, ruq/epigastric pain, n/v.    Objective: BP 121/39  Pulse 120  Temp(Src) 98.4 F (36.9 C) (Oral)  Resp 20  Ht 5\' 8"  (1.727 m)  Wt 171.006 kg (377 lb)  BMI 57.34 kg/m2  SpO2 98%    FHT:  FHR: 145 bpm, variability: moderate,  accelerations:  Present,  decelerations:  Absent UC:   regular, every 2-4 minutes  SVE:   Dilation: Fingertip Effacement (%): Thick Station: -3 Exam by:: J.Thornton, RN  Last po cytotec @ 0845  Labs: Lab Results  Component Value Date   WBC 11.9* 11/01/2013   HGB 9.3* 11/01/2013   HCT 28.2* 11/01/2013   MCV 80.1 11/01/2013   PLT 199 11/01/2013    Assessment / Plan: IOL d/t mild pre-e, has received 3 doses of po cytotec w/ last dose at [place]. Will check cx when time for next dose w/ hope of placing cervical foley bulb. Had long discussion w/ pt about expectations of IOL process and that it can take days sometimes. Recommended she try to rest.   Labor: n/a Fetal Wellbeing:  Category I Pain Control:  Fentanyl Pre-eclampsia: mild, bp's stable, no symptoms I/D:  Will start pcn for gbs+ w/ rom or active labor Anticipated MOD:  NSVD  Tawnya Crook CNM, WHNP-BC 11/02/2013, 9:23 AM

## 2013-11-02 NOTE — Progress Notes (Signed)
Pt eating dinner

## 2013-11-02 NOTE — Progress Notes (Signed)
[place] tracing intermittently--pt sitting up in bed eating breakfast--will continue to assess

## 2013-11-02 NOTE — Progress Notes (Signed)
Patient ID: Judy Nelson, female   DOB: 1986/01/20, 28 y.o.   MRN: [id number] Judy Nelson is a 28 y.o. G1P0 at [redacted]w[redacted]d admitted for IOL d/t pre-e  Subjective: Uncomfortable w/ uc's, getting iv fentanyl q 1hr. Denies ha, scotomata, ruq/epigastric pain, n/v.    Objective: BP 118/53  Pulse 101  Temp(Src) 98.4 F (36.9 C) (Oral)  Resp 20  Ht 5\' 8"  (1.727 m)  Wt 171.006 kg (377 lb)  BMI 57.34 kg/m2  SpO2 98%  BPs stable  FHT:  FHR: 150 bpm, variability: moderate,  accelerations:  Present,  decelerations:  Absent UC:   regular, every 2-4 minutes  SVE:   Dilation: 1.5 Effacement (%): Thick Station: -3 Exam by:: K.Booker, CNM  Cervical foley bulb inserted and inflated w/ 65ml LR w/o difficulty    Labs: Lab Results  Component Value Date   WBC 11.9* 11/01/2013   HGB 9.3* 11/01/2013   HCT 28.2* 11/01/2013   MCV 80.1 11/01/2013   PLT 199 11/01/2013    Assessment / Plan: IOL d/t pre-e, s/p po cytotec x 3, cervical foley bulb now in place. Will plan to initiate pitocin per protocol once foley bulb is out.   Labor: n/a Fetal Wellbeing:  Category I Pain Control:  Fentanyl Pre-eclampsia: w/o severe features, bp's stable I/D:  pcn for gbs+ w/ active labor or rom, whichever first Anticipated MOD:  NSVD  Tawnya Crook CNM, WHNP-BC 11/02/2013, 1:27 PM

## 2013-11-03 ENCOUNTER — Encounter (HOSPITAL_COMMUNITY): Payer: Self-pay | Admitting: *Deleted

## 2013-11-03 ENCOUNTER — Encounter (HOSPITAL_COMMUNITY): Payer: Medicaid Other | Admitting: Anesthesiology

## 2013-11-03 ENCOUNTER — Inpatient Hospital Stay (HOSPITAL_COMMUNITY): Payer: Medicaid Other | Admitting: Anesthesiology

## 2013-11-03 DIAGNOSIS — E669 Obesity, unspecified: Secondary | ICD-10-CM

## 2013-11-03 DIAGNOSIS — O99214 Obesity complicating childbirth: Secondary | ICD-10-CM

## 2013-11-03 DIAGNOSIS — IMO0002 Reserved for concepts with insufficient information to code with codable children: Secondary | ICD-10-CM

## 2013-11-03 MED ORDER — LIDOCAINE HCL (PF) 1 % IJ SOLN
INTRAMUSCULAR | Status: DC | PRN
  Administered 2013-11-03 (×2): 5 mL

## 2013-11-03 MED ORDER — IBUPROFEN 600 MG PO TABS
600.0000 mg | ORAL_TABLET | Freq: Four times a day (QID) | ORAL | Status: DC
  Administered 2013-11-04 (×3): 600 mg via ORAL
  Filled 2013-11-03 (×3): qty 1

## 2013-11-03 MED ORDER — OXYCODONE-ACETAMINOPHEN 5-325 MG PO TABS: 1.0000 | ORAL_TABLET | ORAL | Status: DC | PRN

## 2013-11-03 MED ORDER — OXYTOCIN 40 UNITS IN LACTATED RINGERS INFUSION - SIMPLE MED
1.0000 m[IU]/min | INTRAVENOUS | Status: DC
  Administered 2013-11-03: 2 m[IU]/min via INTRAVENOUS
  Filled 2013-11-03: qty 1000

## 2013-11-03 MED ORDER — BENZOCAINE-MENTHOL 20-0.5 % EX AERO
1.0000 "application " | INHALATION_SPRAY | CUTANEOUS | Status: DC | PRN
  Administered 2013-11-04: 1 via TOPICAL
  Filled 2013-11-03: qty 56

## 2013-11-03 MED ORDER — ZOLPIDEM TARTRATE 5 MG PO TABS: 5.0000 mg | ORAL_TABLET | Freq: Every evening | ORAL | Status: DC | PRN

## 2013-11-03 MED ORDER — DIPHENHYDRAMINE HCL 25 MG PO CAPS: 25.0000 mg | ORAL_CAPSULE | Freq: Four times a day (QID) | ORAL | Status: DC | PRN

## 2013-11-03 MED ORDER — PENICILLIN G POTASSIUM 5000000 UNITS IJ SOLR
2.5000 10*6.[IU] | INTRAVENOUS | Status: DC
  Administered 2013-11-03: 2.5 10*6.[IU] via INTRAVENOUS
  Filled 2013-11-03 (×5): qty 2.5

## 2013-11-03 MED ORDER — TERBUTALINE SULFATE 1 MG/ML IJ SOLN: 0.2500 mg | Freq: Once | INTRAMUSCULAR | Status: DC | PRN

## 2013-11-03 MED ORDER — ONDANSETRON HCL 4 MG PO TABS: 4.0000 mg | ORAL_TABLET | ORAL | Status: DC | PRN

## 2013-11-03 MED ORDER — ONDANSETRON HCL 4 MG/2ML IJ SOLN: 4.0000 mg | INTRAMUSCULAR | Status: DC | PRN

## 2013-11-03 MED ORDER — SENNOSIDES-DOCUSATE SODIUM 8.6-50 MG PO TABS
2.0000 | ORAL_TABLET | ORAL | Status: DC
  Administered 2013-11-04: 2 via ORAL
  Filled 2013-11-03: qty 2

## 2013-11-03 MED ORDER — NALBUPHINE HCL 10 MG/ML IJ SOLN
5.0000 mg | Freq: Once | INTRAMUSCULAR | Status: AC
  Administered 2013-11-03: 5 mg via INTRAVENOUS
  Filled 2013-11-03: qty 1

## 2013-11-03 MED ORDER — DEXTROSE 5 % IV SOLN
5.0000 10*6.[IU] | Freq: Once | INTRAVENOUS | Status: AC
  Administered 2013-11-03: 5 10*6.[IU] via INTRAVENOUS
  Filled 2013-11-03: qty 5

## 2013-11-03 MED ORDER — WITCH HAZEL-GLYCERIN EX PADS: 1.0000 "application " | MEDICATED_PAD | CUTANEOUS | Status: DC | PRN

## 2013-11-03 MED ORDER — LANOLIN HYDROUS EX OINT: TOPICAL_OINTMENT | CUTANEOUS | Status: DC | PRN

## 2013-11-03 MED ORDER — TETANUS-DIPHTH-ACELL PERTUSSIS 5-2.5-18.5 LF-MCG/0.5 IM SUSP
0.5000 mL | Freq: Once | INTRAMUSCULAR | Status: AC
  Administered 2013-11-04: 0.5 mL via INTRAMUSCULAR
  Filled 2013-11-03: qty 0.5

## 2013-11-03 MED ORDER — SIMETHICONE 80 MG PO CHEW: 80.0000 mg | CHEWABLE_TABLET | ORAL | Status: DC | PRN

## 2013-11-03 MED ORDER — PRENATAL MULTIVITAMIN CH
1.0000 | ORAL_TABLET | Freq: Every day | ORAL | Status: DC
  Administered 2013-11-04: 1 via ORAL
  Filled 2013-11-03: qty 1

## 2013-11-03 MED ORDER — DIBUCAINE 1 % RE OINT: 1.0000 "application " | TOPICAL_OINTMENT | RECTAL | Status: DC | PRN

## 2013-11-03 NOTE — Progress Notes (Signed)
Patient ID: Judy Nelson, female   DOB: 12-26-1985, 28 y.o.   MRN: [id number] Judy Nelson is a 28 y.o. G1P0 at [redacted]w[redacted]d by ultrasound admitted for induction of labor due to Pre Eclampsia.  Subjective: Pt feeling pressure  Objective: BP 150/76  Pulse 96  Temp(Src) 98.9 F (37.2 C) (Oral)  Resp 20  Ht 5\' 8"  (1.727 m)  Wt 171.006 kg (377 lb)  BMI 57.34 kg/m2  SpO2 98%      FHT:  FHR: 150 bpm, variability: moderate,  accelerations:  Present,  decelerations:  Absent UC:   regular, every 2-3 minutes SVE:   Dilation: 9 Effacement (%): 90 Station: 0 Exam by:: dr Harlo Fabela  Labs: Lab Results  Component Value Date   WBC 12.6* 11/02/2013   HGB 9.3* 11/02/2013   HCT 28.3* 11/02/2013   MCV 80.6 11/02/2013   PLT 199 11/02/2013    Assessment / Plan: Induction of labor due to Pre eclampsia, progressing well  Labor: No change from prior exam, IUPC placed and will augment to adequate ctx Preeclampsia:  no signs or symptoms of toxicity Fetal Wellbeing:  Category I Pain Control:  Epidural I/D:  n/a Anticipated MOD:  NSVD  Judy Nelson 11/03/2013, 3:33 PM

## 2013-11-03 NOTE — Progress Notes (Signed)
Patient ID: Natavia Sublette, female   DOB: 06/21/1986, 28 y.o.   MRN: [id number] Maricia Scotti is a 29 y.o. G1P0 at [redacted]w[redacted]d by ultrasound admitted for induction of labor due to Pre Eclampsia.  Subjective: Patient is comfortable on epidural, minimal change - IUPC placed  Objective: BP 133/61  Pulse 93  Temp(Src) 98 F (36.7 C) (Oral)  Resp 20  Ht 5\' 8"  (1.727 m)  Wt 171.006 kg (377 lb)  BMI 57.34 kg/m2  SpO2 98%      FHT:  FHR: 150 bpm, variability: moderate,  accelerations:  Present,  decelerations:  Absent UC:   regular, every 2-3 minutes SVE:   Dilation: 6.5 Effacement (%): 80 Station: -2 Exam by:: [employer], CNM  Labs: Lab Results  Component Value Date   WBC 12.6* 11/02/2013   HGB 9.3* 11/02/2013   HCT 28.3* 11/02/2013   MCV 80.6 11/02/2013   PLT 199 11/02/2013    Assessment / Plan: Induction of labor due to Pre eclampsia, progressing well  Labor: No change from prior exam, IUPC placed and will augment to adequate ctx Preeclampsia:  no signs or symptoms of toxicity Fetal Wellbeing:  Category I Pain Control:  Epidural I/D:  n/a Anticipated MOD:  NSVD  Muazu, Aisha 11/03/2013, 10:03 AM  I spoke with and examined patient and agree with Medical Student's note and plan of care.  Fredrik Rigger, MD OB Fellow 11/03/2013 11:18 AM

## 2013-11-03 NOTE — Anesthesia Procedure Notes (Signed)
Epidural Patient location during procedure: OB Start time: 11/03/2013 1:24 AM  Staffing Anesthesiologist: Rudean Curt Performed by: anesthesiologist   Preanesthetic Checklist Completed: patient identified, site marked, surgical consent, pre-op evaluation, timeout performed, IV checked, risks and benefits discussed and monitors and equipment checked  Epidural Patient position: sitting Prep: site prepped and draped and DuraPrep Patient monitoring: continuous pulse ox and blood pressure Approach: midline Injection technique: LOR air  Needle:  Needle type: Tuohy  Needle gauge: 17 G Needle length: 9 cm and 9 Needle insertion depth: 9 cm Catheter type: closed end flexible Catheter size: 19 Gauge Catheter at skin depth: 15 cm Test dose: negative  Assessment Events: blood not aspirated, injection not painful, no injection resistance, negative IV test and no paresthesia  Additional Notes Patient identified.  Risk benefits discussed including failed block, incomplete pain control, headache, nerve damage, paralysis, blood pressure changes, nausea, vomiting, reactions to medication both toxic or allergic, and postpartum back pain.  Patient expressed understanding and wished to proceed.  All questions were answered.  Sterile technique used throughout procedure and epidural site dressed with sterile barrier dressing. No paresthesia or other complications noted.The patient did not experience any signs of intravascular injection such as tinnitus or metallic taste in mouth nor signs of intrathecal spread such as rapid motor block. Please see nursing notes for vital signs.

## 2013-11-03 NOTE — Progress Notes (Signed)
Patient ID: Judy Nelson, female   DOB: [birth year], 28 y.o.   MRN: [id number] Judy Nelson is a 28 y.o. G1P0 at [redacted]w[redacted]d  admitted for induction of labor due to pre-e.  Subjective: Comfortable w/ epidural. Denies ha, scotomata, ruq/epigastric pain, n/v.   SROM clear fluid @ 0430  Objective: BP 146/69  Pulse 102  Temp(Src) 98 F (36.7 C) (Oral)  Resp 18  Ht 5\' 8"  (1.727 m)  Wt 171.006 kg (377 lb)  BMI 57.34 kg/m2  SpO2 98%    FHT:  FHR: 150 bpm, variability: moderate,  accelerations:  Present,  decelerations:  Absent UC:   regular, every 2-5 minutes  SVE:   Dilation: 6.5 Effacement (%): 80 Station: -2 Exam by:: [employer], CNM   Labs: Lab Results  Component Value Date   WBC 12.6* 11/02/2013   HGB 9.3* 11/02/2013   HCT 28.3* 11/02/2013   MCV 80.6 11/02/2013   PLT 199 11/02/2013    Assessment / Plan: IOL d/t pre-e, s/p cervical foley bulb, was contracting on own and srom'd clear fluid at 0430, has made slight cervical change, will augment w/ pitocin per protocol  Labor: early Fetal Wellbeing:  Category I Pain Control:  Epidural Pre-eclampsia: mild, bp's stable, asymptomatic I/D:  pcn for gbs+ Anticipated MOD:  NSVD  Tawnya Crook CNM, WHNP-BC 11/03/2013, 6:45 AM

## 2013-11-03 NOTE — Progress Notes (Signed)
Patient ID: Judy Nelson, female   DOB: [birth year], 28 y.o.   MRN: [id number] Judy Nelson is a 28 y.o. G1P0 at [redacted]w[redacted]d  admitted for induction of labor due to pre-e.  Subjective: Getting comfortable w/ epidural that was just placed. Denies ha, scotomata, ruq/epigastric pain, n/v.    Objective: BP 136/62  Pulse 101  Temp(Src) 98 F (36.7 C) (Oral)  Resp 18  Ht 5\' 8"  (1.727 m)  Wt 171.006 kg (377 lb)  BMI 57.34 kg/m2  SpO2 98%    FHT:  FHR: 155-160 bpm, variability: moderate,  accelerations:  Present,  decelerations:  Absent UC:   App q 52min, not tracing well on efm, toco readjusted  SVE:   Dilation: 6 Effacement (%): 80 Station: -2 Exam by:: [email or web address]  Foley bulb fell out and pt requested epidural  Labs: Lab Results  Component Value Date   WBC 12.6* 11/02/2013   HGB 9.3* 11/02/2013   HCT 28.3* 11/02/2013   MCV 80.6 11/02/2013   PLT 199 11/02/2013    Assessment / Plan: IOL d/t pre-e, cervical foley bulb just fell out and pt requested epidural, which she just got. Will see how uc pattern is, then begin pitocin if needed  Labor: s/p cervical ripening phase Fetal Wellbeing:  Category I Pain Control:  Epidural Pre-eclampsia: mild, bp's stable, asymptomatic I/D:  PCN for gbs + Anticipated MOD:  NSVD  Tawnya Crook CNM, WHNP-BC 11/03/2013, 1:29 AM

## 2013-11-03 NOTE — Progress Notes (Signed)
Dr Leslie Andrea at bedside

## 2013-11-03 NOTE — Anesthesia Preprocedure Evaluation (Signed)
Anesthesia Evaluation  Patient identified by MRN, date of birth, ID band Patient awake    Reviewed: Allergy & Precautions, H&P , Patient's Chart, lab work & pertinent test results  Airway Mallampati: II TM Distance: >3 FB Neck ROM: full    Dental   Pulmonary former smoker,  breath sounds clear to auscultation        Cardiovascular hypertension, Rhythm:regular Rate:Normal     Neuro/Psych    GI/Hepatic   Endo/Other  Morbid obesity  Renal/GU      Musculoskeletal   Abdominal   Peds  Hematology   Anesthesia Other Findings   Reproductive/Obstetrics (+) Pregnancy                           Anesthesia Physical Anesthesia Plan  ASA: III  Anesthesia Plan: Epidural   Post-op Pain Management:    Induction:   Airway Management Planned:   Additional Equipment:   Intra-op Plan:   Post-operative Plan:   Informed Consent: I have reviewed the patients History and Physical, chart, labs and discussed the procedure including the risks, benefits and alternatives for the proposed anesthesia with the patient or authorized representative who has indicated his/her understanding and acceptance.     Plan Discussed with:   Anesthesia Plan Comments:         Anesthesia Quick Evaluation

## 2013-11-04 MED ORDER — IBUPROFEN 600 MG PO TABS: 600.0000 mg | ORAL_TABLET | Freq: Four times a day (QID) | ORAL | Status: DC

## 2013-11-04 NOTE — Discharge Instructions (Signed)
Before [facility] Ask any questions about feeding, diapering, and baby care before you leave the hospital. Ask again if you do not understand. Ask when you need to see the doctor again. There are several things you must have before your baby comes home.  Infant car seat.  Crib.  Do not let your baby sleep in a bed with you or anyone else.  If you do not have a bed for your baby, ask the doctor what you can use that will be safe for the baby to sleep in. Infant feeding supplies:  6 to 8 bottles (8 oz. size).  6 to 8 nipples.  Measuring cup.  Measuring tablespoon.  Bottle brush.  Sterilizer (or use any large pan or kettle with a lid).  Formula that contains iron.  A way to boil and cool water. Breastfeeding supplies:  Breast pump.  Nipple cream. Clothing:  24 to 36 cloth diapers and waterproof diaper covers or a box of disposable diapers. You may need as many as 10 to 12 diapers per day.  3 onesies (other clothing will depend on the time of year and the weather).  3 receiving blankets.  3 baby pajamas or gowns.  3 bibs. Bath equipment:  Mild soap.  Petroleum jelly. No baby oil or powder.  Soft cloth towel and wash cloth.  Cotton balls.  Separate bath basin for baby. Only sponge bathe until umbilical cord and circumcision are healed. Other supplies:  Thermometer and bulb syringe (ask the hospital to send them home with you). Ask your doctor about how you should take your baby's temperature.  One to two pacifiers. Prepare for an emergency:  Know how to get to the hospital and know where to admit your baby.  Put all doctor numbers near your house phone and in your cell phone if you have one. Prepare your family:  Talk with siblings about the baby coming home and how they feel about it.  Decide how you want to handle visitors and other family members.  Take offers for help with the baby. You will need time to adjust. Know when to call the doctor.   GET HELP RIGHT AWAY IF:  Your baby's temperature is greater than 100.4 F (38 C).  The softspot on your baby's head starts to bulge.  Your baby is crying with no tears or has no wet diapers for 6 hours.  Your baby has rapid breathing.  Your baby is not as alert. Document Released: 09/15/2008 Document Revised: 12/26/2011 Document Reviewed: 12/23/2010 [facility] Patient Information 2014 [place], [place].  AFP Maternal This is a routine screen (tests) used to check for fetal abnormalities such as Down syndrome and neural tube defects. Down Syndrome is a chromosomal abnormality, sometimes called Trisomy 38. Neural tube defects are serious birth defects. The brain, spinal cord, or their coverings do not develop completely. Women should be tested in the 15th to 20th week of pregnancy. The msAFP screen involves three or four tests that measure substances found in the blood that make the testing better. During development, AFP levels in fetal blood and amniotic fluid rise until about 12 weeks. The levels then gradually fall until birth. AFP is a protein produce by fetal tissue. AFP crosses the placenta and appears in the maternal blood. A baby with an open neural tube defect has an opening in its spine, head, or abdominal wall that allows higher-than-usual amounts of AFP to pass into the mother's blood. If a screen is positive, more tests  are needed to make a diagnosis. These include ultrasound and perhaps amniocentesis (checking the fluid that surrounds the baby). These tests are used to help women and their caregivers make decisions about the management of their pregnancies. In pregnancies where the fetus is carrying the chromosomal defect that results in Down syndrome, the levels of AFP and unconjugated estriol tend to be low and hCG and inhibin A levels high.  PREPARATION FOR TEST Blood is drawn from a vein in your arm usually between the 15th and 20th weeks of pregnancy. Four different tests on your  blood are done. These are AFP, hCG, unconjugated estriol, and inhibin A. The combination of tests produces a more accurate result. NORMAL FINDINGS   Adult: less than 40ng/mL or less than 40 mg/L (SI units)  Child younger than1 year: less than 30 ng/mL Ranges are stratified by weeks of gestation and vary among laboratories. Ranges for normal findings may vary among different laboratories and hospitals. You should always check with your doctor after having lab work or other tests done to discuss the meaning of your test results and whether your values are considered within normal limits. MEANING OF TEST  These are screening tests. Not all fetal abnormalities will give positive test results. Of all women who have positive AFP screening results, only a very small number of them have babies who actually have a neural tube defect or chromosomal abnormality. Your caregiver will go over the test results with you and discuss the importance and meaning of your results, as well as treatment options and the need for additional tests if necessary. OBTAINING THE TEST RESULTS It is your responsibility to obtain your test results. Ask the lab or department performing the test when and how you will get your results. Document Released: 10/25/2004 Document Revised: 12/26/2011 Document Reviewed: 09/06/2008 [facility] Patient Information 2014 [place], [place].  Before [facility] Ask any questions about feeding, diapering, and baby care before you leave the hospital. Ask again if you do not understand. Ask when you need to see the doctor again. There are several things you must have before your baby comes home.  Infant car seat.  Crib.  Do not let your baby sleep in a bed with you or anyone else.  If you do not have a bed for your baby, ask the doctor what you can use that will be safe for the baby to sleep in. Infant feeding supplies:  6 to 8 bottles (8 oz. size).  6 to 8 nipples.  Measuring  cup.  Measuring tablespoon.  Bottle brush.  Sterilizer (or use any large pan or kettle with a lid).  Formula that contains iron.  A way to boil and cool water. Breastfeeding supplies:  Breast pump.  Nipple cream. Clothing:  24 to 36 cloth diapers and waterproof diaper covers or a box of disposable diapers. You may need as many as 10 to 12 diapers per day.  3 onesies (other clothing will depend on the time of year and the weather).  3 receiving blankets.  3 baby pajamas or gowns.  3 bibs. Bath equipment:  Mild soap.  Petroleum jelly. No baby oil or powder.  Soft cloth towel and wash cloth.  Cotton balls.  Separate bath basin for baby. Only sponge bathe until umbilical cord and circumcision are healed. Other supplies:  Thermometer and bulb syringe (ask the hospital to send them home with you). Ask your doctor about how you should take your baby's temperature.  One to two  pacifiers. Prepare for an emergency:  Know how to get to the hospital and know where to admit your baby.  Put all doctor numbers near your house phone and in your cell phone if you have one. Prepare your family:  Talk with siblings about the baby coming home and how they feel about it.  Decide how you want to handle visitors and other family members.  Take offers for help with the baby. You will need time to adjust. Know when to call the doctor.  GET HELP RIGHT AWAY IF:  Your baby's temperature is greater than 100.4 F (38 C).  The softspot on your baby's head starts to bulge.  Your baby is crying with no tears or has no wet diapers for 6 hours.  Your baby has rapid breathing.  Your baby is not as alert. Document Released: 09/15/2008 Document Revised: 12/26/2011 Document Reviewed: 12/23/2010 [facility] Patient Information 2014 [place], [place].  Breast Pumping Tips Pumping your breast milk is a good way to stimulate milk production and have a steady supply of breast milk for your  infant. Pumping is most helpful during your infant's growth spurts, when involving dad or a family member, or when you are away. There are several types of pumps available. They can be purchased at a baby or maternity store. You can begin pumping soon after delivery, but some experts believe that you should wait about four weeks to give your infant a bottle. In general, the more you breastfeed or pump, the more milk you will have for your infant. It is also important to take good care of yourself. This will reduce stress and help your body to create a healthy supply of milk. Your caregiver or lactation consultant can give you the information and support you need in your efforts to breastfeed your infant. PUMPING BREAST MILK  Follow the tips below for successful breast pumping. Take care of yourself.  Drink enough water or fluids to keep urine clear or pale yellow. You may notice a thirsty feeling while breastfeeding. This is because your body needs more water to make breast milk. Keep a large water bottle handy. Make healthy drink choices such as unsweetened fruit juice, milk and water. Limit soda, coffee, and alcohol (wait 2 hours to feed or pump if you have an alcoholic drink.)  Eat a healthy, well-balanced diet rich in fruits, vegetables, and whole grains.  Exercise as recommended by your caregiver.  Get plenty of sleep. Sleep when your infant sleeps. Ask friends and family for help if you need time to nap or rest.  Do not smoke. Smoking can lower your milk supply and harm your infant. If you need help quitting, ask your caregiver for a program recommendation.  Ask your caregiver about birth control options. Birth control pills may lower your milk supply. You may be advised to use condoms or other forms of birth control. Relax and pump Stimulating your let-down reflex is the key to successful and effective pumping. This makes the milk in all parts of the breast flow more freely.   It is easier  to pump breast milk (and breastfeed) while you are relaxed. Find techniques that work for you. Quiet private spaces, breast massage, soothing heat placed on the breast, music, and pictures or a tape recording of your infant may help you to relax and "let down" your milk. If you have difficulty with your let down, try smelling one of your infant's blankets or an item of clothing he or she  has worn while you are pumping.  When pumping, place the special suction cup (flange) directly over the nipple. It may be uncomfortable and cause nipple damage if it is not placed properly or is the wrong size. Applying a small amount of purified or modified lanolin to your nipple and the areola may help increase your comfort level. Also, you can change the speed and suction of many electric pumps to your comfort level. Your caregiver or lactation consultant can help you with this.  If pumping continues to be painful, or you feel you are not getting very much milk when you pump, you may need a different type of pump. A lactation consultant can help you determine if this is the case.  If you are with your infant, feed him or her on demand and try pumping after each feeding. This will boost your production, even if milk does not come out. You may not be able to pump much milk at first, but keep up the routine, and this will change.  If you are working or away from your infant for several hours, try pumping for about 15 minutes every 2 to 3 hours. Pump both breasts at the same time if you can.  If your infant has a formula feeding, make sure you pump your milk around the same time to maintain your supply.  Begin pumping breast milk a few weeks before you return to work. This will help you develop techniques that work for you and will be able to store extra milk.  Find a source of breastfeeding information that works well for you. TIPS FOR STORING BREAST MILK  Store breast milk in a sealable sterile bag, jar, or  container provided with your pumping supplies.  Store milk in small amounts close to what your infant is drinking at each feeding.  Cool pumped milk in a refrigerator or cooler. Pumped milk can last at the back of the refrigerator for 3 to 8 days.  Place cooled milk at the back of the freezer for up to 3 months.  Thaw the milk in its container or bag in warm water up to 24 hours in advance. Do not use a microwave to thaw or heat milk. Do not refreeze the milk after it has been thawed.  Breast milk is safe to drink when left at room temperature (mid 70s or colder) for 4 to 8 hours. After that, throw it away.  Milk fat can separate and look funny. The color can vary slightly from day to day. This is normal. Always shake the milk before using it to mix the fat with the more watery portion. SEEK MEDICAL CARE IF:   You are having trouble pumping or feeding your infant.  You are concerned that you are not making enough milk.  You have nipple pain, soreness, or redness.  You have other questions or concerns related to you or your infant. Document Released: 03/23/2010 Document Revised: 12/26/2011 Document Reviewed: 03/23/2010 [facility] Patient Information 2014 [place].

## 2013-11-04 NOTE — Anesthesia Postprocedure Evaluation (Signed)
  Anesthesia Post-op Note  Patient: Judy Nelson  Procedure(s) Performed: * No procedures listed *  Patient Location: PACU  Anesthesia Type:Epidural  Level of Consciousness: awake, alert  and oriented  Airway and Oxygen Therapy: Patient Spontanous Breathing  Post-op Pain: mild  Post-op Assessment: Post-op Vital signs reviewed  Post-op Vital Signs: Reviewed and stable  Complications: No apparent anesthesia complications

## 2013-11-04 NOTE — Discharge Summary (Signed)
  Obstetric Discharge Summary Reason for Admission: induction of labor for preeclampsia  Prenatal Procedures: NST Intrapartum Procedures: spontaneous vaginal delivery and GBS prophylaxis Postpartum Procedures: none Complications-Operative and Postpartum: partial 3rd degree perineal laceration Hemoglobin  Date Value Range Status  11/02/2013 9.3* 12.0 - 15.0 g/dL Final     HCT  Date Value Range Status  11/02/2013 28.3* 36.0 - 46.0 % Final    Physical Exam:  General: alert, cooperative, no distress and morbidly obese Lochia: appropriate Uterine Fundus: firm DVT Evaluation: No evidence of DVT seen on physical exam.  Discharge Diagnoses: Term Pregnancy-delivered  Discharge Information: Date: 11/04/2013 Activity: pelvic rest Diet: routine Medications: PNV, Ibuprofen and Dulcolax Condition: stable Instructions: refer to practice specific booklet Discharge to: home   Newborn Data: Live born female  Birth Weight: 8 lb 6.9 oz (3825 g) APGAR: 8, 9  Home with mother.  Cherlyn Roberts 11/04/2013, 9:03 AM  I have seen and examined this patient and agree with above documentation in the medical student's note. Pt presented with elevated pressures at term and an elevated prot/cr ratio so was admitted for IOL.  She was not started on magnesium as no severe pressures. She progressed well and delivered as above with 3rd degree partial tear. Postpartum care was routine with normal pressures returning. She is bottle feeding and desires mirena for contraception.    Of note, pt has a 20cm pelvic mass that will need f/u after the postpartum period and likely will need surgery.    Ebbie Latus, M.D. [facility] Fellow 11/04/2013 9:10 AM

## 2013-11-04 NOTE — Progress Notes (Signed)
Patient ID: Judy Nelson, female   DOB: October 17, 1986, 28 y.o.   MRN: [id number] Post Partum Day 1 Subjective: Patient is doing well, complains of mild abdominal cramping. Bleeding is light, less than yesterday. +flatus, no bowel mov't yet. No voiding difficulties, tolerating PO. She is up and walking. Pt denies any headaches, visual changes, SOB or RUQ Pain. Bottle feeding Mirena for contraception  Objective: Blood pressure 116/72, pulse 102, temperature 97.9 F (36.6 C), temperature source Oral, resp. rate 20, height 5\' 8"  (1.727 m), weight 171.006 kg (377 lb), SpO2 100.00%, unknown if currently breastfeeding.  Physical Exam:  General: alert, cooperative, no distress and morbidly obese Lochia: appropriate Uterine Fundus: firm DVT Evaluation: No evidence of DVT seen on physical exam.   Recent Labs  11/01/13 1500 11/02/13 1920  HGB 9.3* 9.3*  HCT 28.2* 28.3*    Assessment/Plan: Plan for discharge tomorrow   LOS: 3 days   Muazu, Aisha 11/04/2013, 7:38 AM   I have seen and examined this patient and agree with above documentation in the medical student's note. Pt meeting [facility] and being discharged today. See other discharge note.   Ebbie Latus, M.D. [facility] [place] [facility] Fellow 11/04/2013 9:09 AM

## 2013-11-05 LAB — TYPE AND SCREEN
ABO/RH(D): O POS
ANTIBODY SCREEN: NEGATIVE
UNIT DIVISION: 0
UNIT DIVISION: 0
UNIT DIVISION: 0
Unit division: 0

## 2013-11-11 DIAGNOSIS — Z029 Encounter for administrative examinations, unspecified: Secondary | ICD-10-CM

## 2013-11-12 ENCOUNTER — Encounter (INDEPENDENT_AMBULATORY_CARE_PROVIDER_SITE_OTHER): Payer: Self-pay

## 2013-11-12 ENCOUNTER — Ambulatory Visit (INDEPENDENT_AMBULATORY_CARE_PROVIDER_SITE_OTHER): Payer: Medicaid Other | Admitting: Obstetrics & Gynecology

## 2013-11-12 ENCOUNTER — Encounter: Payer: Self-pay | Admitting: Obstetrics & Gynecology

## 2013-11-12 VITALS — BP 150/100 | Wt 364.8 lb

## 2013-11-12 DIAGNOSIS — O99345 Other mental disorders complicating the puerperium: Secondary | ICD-10-CM

## 2013-11-12 DIAGNOSIS — F53 Postpartum depression: Secondary | ICD-10-CM

## 2013-11-12 MED ORDER — ESCITALOPRAM OXALATE 20 MG PO TABS: 20.0000 mg | ORAL_TABLET | Freq: Every day | ORAL | Status: DC

## 2013-11-12 MED ORDER — TRIAMTERENE-HCTZ 37.5-25 MG PO TABS
1.0000 | ORAL_TABLET | Freq: Every day | ORAL | Status: DC
Start: 2013-11-12 — End: 2014-01-07

## 2013-11-12 NOTE — Progress Notes (Signed)
Patient ID: Judy Nelson, female   DOB: Feb 06, 1986, 28 y.o.   MRN: [id number] Pt has 2 concerns #1 wants to make sure her bottom is healing well(I was there for repair) On exam healing quite well with sutures intact Normal 9 days out  #2 PP blues/depression Pt with crying episodes and feels unlike herself or at any time before No thoughts of harm to her or baby

## 2013-12-10 ENCOUNTER — Encounter: Payer: Medicaid Other | Admitting: Obstetrics & Gynecology

## 2013-12-16 ENCOUNTER — Encounter: Payer: Self-pay | Admitting: Obstetrics & Gynecology

## 2013-12-16 ENCOUNTER — Encounter: Payer: Medicaid Other | Admitting: Obstetrics & Gynecology

## 2013-12-16 ENCOUNTER — Ambulatory Visit (INDEPENDENT_AMBULATORY_CARE_PROVIDER_SITE_OTHER): Payer: Medicaid Other | Admitting: Obstetrics & Gynecology

## 2013-12-16 ENCOUNTER — Encounter (INDEPENDENT_AMBULATORY_CARE_PROVIDER_SITE_OTHER): Payer: Self-pay

## 2013-12-16 VITALS — BP 160/100 | Wt 351.0 lb

## 2013-12-16 DIAGNOSIS — F53 Postpartum depression: Secondary | ICD-10-CM

## 2013-12-16 DIAGNOSIS — O99345 Other mental disorders complicating the puerperium: Secondary | ICD-10-CM

## 2013-12-16 MED ORDER — VENLAFAXINE HCL ER 75 MG PO CP24: ORAL_CAPSULE | ORAL | Status: DC

## 2013-12-16 MED ORDER — VENLAFAXINE HCL ER 150 MG PO CP24
ORAL_CAPSULE | ORAL | Status: DC
Start: 2013-12-16 — End: 2014-01-07

## 2013-12-16 NOTE — Progress Notes (Signed)
Patient ID: Judy Nelson, female   DOB: 06-22-1986, 28 y.o.   MRN: [id number] Pt with pp depression stopped her lexapro on her own, "was not helping" after 2 weeks  Will try effexor, has score of 23  Exam  Normal well healed vagina Uterus normal involution  Just finished menses  Wants mirena Will place that before proceeding with laparoscopic removal of her large ovarian cyst

## 2013-12-17 ENCOUNTER — Ambulatory Visit: Payer: Medicaid Other | Admitting: Advanced Practice Midwife

## 2013-12-22 NOTE — Progress Notes (Signed)
This encounter was created in error - please disregard.

## 2013-12-23 ENCOUNTER — Encounter: Payer: Self-pay | Admitting: Women's Health

## 2013-12-23 ENCOUNTER — Ambulatory Visit (INDEPENDENT_AMBULATORY_CARE_PROVIDER_SITE_OTHER): Payer: Medicaid Other | Admitting: Women's Health

## 2013-12-23 VITALS — BP 130/90 | Wt 336.0 lb

## 2013-12-23 DIAGNOSIS — O99345 Other mental disorders complicating the puerperium: Secondary | ICD-10-CM | POA: Insufficient documentation

## 2013-12-23 DIAGNOSIS — Z3202 Encounter for pregnancy test, result negative: Secondary | ICD-10-CM

## 2013-12-23 DIAGNOSIS — F53 Postpartum depression: Secondary | ICD-10-CM | POA: Insufficient documentation

## 2013-12-23 DIAGNOSIS — Z30430 Encounter for insertion of intrauterine contraceptive device: Secondary | ICD-10-CM

## 2013-12-23 LAB — POCT URINE PREGNANCY: PREG TEST UR: NEGATIVE

## 2013-12-23 NOTE — Patient Instructions (Signed)
Nothing in vagina for 3 days (no sex, douching, tampons, etc...)  Check your strings once a month to make sure you can feel them, if you are not able to please let us know  If you develop a fever of 100.4 or more in the next few weeks, or if you develop severe abdominal pain, please let [place] know  Use a backup method of birth control, such as condoms, for 2 weeks   Levonorgestrel intrauterine device (IUD)- Mirena What is this medicine? LEVONORGESTREL IUD (LEE voe nor jes trel) is a contraceptive (birth control) device. The device is placed inside the uterus by a healthcare professional. It is used to prevent pregnancy and can also be used to treat heavy bleeding that occurs during your period. Depending on the device, it can be used for 3 to 5 years. This medicine may be used for other purposes; ask your health care provider or pharmacist if you have questions. COMMON BRAND NAME(S): Jerral Bonito What should I tell my health care provider before I take this medicine? They need to know if you have any of these conditions: -abnormal Pap smear -cancer of the breast, uterus, or cervix -diabetes -endometritis -genital or pelvic infection now or in the past -have more than one sexual partner or your partner has more than one partner -heart disease -history of an ectopic or tubal pregnancy -immune system problems -IUD in place -liver disease or tumor -problems with blood clots or take blood-thinners -use intravenous drugs -uterus of unusual shape -vaginal bleeding that has not been explained -an unusual or allergic reaction to levonorgestrel, other hormones, silicone, or polyethylene, medicines, foods, dyes, or preservatives -pregnant or trying to get pregnant -breast-feeding How should I use this medicine? This device is placed inside the uterus by a health care professional. Talk to your pediatrician regarding the use of this medicine in children. Special care may be  needed. Overdosage: If you think you have taken too much of this medicine contact a poison control center or emergency room at once. NOTE: This medicine is only for you. Do not share this medicine with others. What if I miss a dose? This does not apply. What may interact with this medicine? Do not take this medicine with any of the following medications: -amprenavir -bosentan -fosamprenavir This medicine may also interact with the following medications: -aprepitant -barbiturate medicines for inducing sleep or treating seizures -bexarotene -griseofulvin -medicines to treat seizures like carbamazepine, ethotoin, felbamate, oxcarbazepine, phenytoin, topiramate -modafinil -pioglitazone -rifabutin -rifampin -rifapentine -some medicines to treat HIV infection like atazanavir, indinavir, lopinavir, nelfinavir, tipranavir, ritonavir -St. John's wort -warfarin This list may not describe all possible interactions. Give your health care provider a list of all the medicines, herbs, non-prescription drugs, or dietary supplements you use. Also tell them if you smoke, drink alcohol, or use illegal drugs. Some items may interact with your medicine. What should I watch for while using this medicine? Visit your doctor or health care professional for regular check ups. See your doctor if you or your partner has sexual contact with others, becomes HIV positive, or gets a sexual transmitted disease. This product does not protect you against HIV infection (AIDS) or other sexually transmitted diseases. You can check the placement of the IUD yourself by reaching up to the top of your vagina with clean fingers to feel the threads. Do not pull on the threads. It is a good habit to check placement after each menstrual period. Call your doctor right away if you feel  more of the IUD than just the threads or if you cannot feel the threads at all. The IUD may come out by itself. You may become pregnant if the device  comes out. If you notice that the IUD has come out use a backup birth control method like condoms and call your health care provider. Using tampons will not change the position of the IUD and are okay to use during your period. What side effects may I notice from receiving this medicine? Side effects that you should report to your doctor or health care professional as soon as possible: -allergic reactions like skin rash, itching or hives, swelling of the face, lips, or tongue -fever, flu-like symptoms -genital sores -high blood pressure -no menstrual period for 6 weeks during use -pain, swelling, warmth in the leg -pelvic pain or tenderness -severe or sudden headache -signs of pregnancy -stomach cramping -sudden shortness of breath -trouble with balance, talking, or walking -unusual vaginal bleeding, discharge -yellowing of the eyes or skin Side effects that usually do not require medical attention (report to your doctor or health care professional if they continue or are bothersome): -acne -breast pain -change in sex drive or performance -changes in weight -cramping, dizziness, or faintness while the device is being inserted -headache -irregular menstrual bleeding within first 3 to 6 months of use -nausea This list may not describe all possible side effects. Call your doctor for medical advice about side effects. You may report side effects to FDA at 1-800-FDA-1088. Where should I keep my medicine? This does not apply. NOTE: This sheet is a summary. It may not cover all possible information. If you have questions about this medicine, talk to your doctor, pharmacist, or health care provider.  2014, [employer]vier/Gold Standard. (2011-11-03 13:54:04)

## 2013-12-23 NOTE — Progress Notes (Signed)
Patient ID: Judy Nelson, female   DOB: [birth year], 28 y.o.   MRN: [id number] Judy Nelson is a 28 y.o. year old [id number] African American female 7wks s/p SVD who presents for placement of a Mirena IUD. She has a 20.5cm cystic mid pelvic mass, no uterine distortion. Plans removal w/ LHE after IUD insertion.  States she wants to get it out asap, feels thinking about it is contributing to her anxiety/depression.   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 Denies SI/HI/II.   Patient's last menstrual period was 12/13/2013. BP 130/90  Wt 336 lb (152.409 kg)  LMP 12/13/2013 Last sexual intercourse was Sept 2014, and pregnancy test today was negative  The risks and benefits of the method and placement have been thouroughly reviewed with the patient and all questions were answered.  Specifically the patient is aware of failure rate of 10/998, expulsion of the IUD and of possible perforation.  The patient is aware of irregular bleeding due to the method and understands the incidence of irregular bleeding diminishes with time.  Signed copy of informed consent in chart.   Time out was performed.  A graves speculum was placed in the vagina.  The cervix was visualized, prepped using Betadine, and grasped with a single tooth tenaculum. The uterus was found to be retroflexed and it sounded to 9 cm.  Mirena IUD placed per manufacturer's recommendations.   The strings were trimmed to 3 cm.  Sonogram was performed and the proper placement of the IUD was verified via transvaginal u/s.   The patient was given post procedure instructions, including signs and symptoms of infection and to check for the strings after each menses or each month, and refraining from intercourse or anything in the vagina for 3 days.  She was given a Mirena care card with date Mirena placed, and date Mirena to be removed.  F/U 1 wk for pre-op w/ LHE  Tawnya Crook CNM,  [facility] 12/23/2013 12:49 PM

## 2013-12-30 ENCOUNTER — Ambulatory Visit (INDEPENDENT_AMBULATORY_CARE_PROVIDER_SITE_OTHER): Payer: Medicaid Other | Admitting: Obstetrics & Gynecology

## 2013-12-30 ENCOUNTER — Encounter: Payer: Self-pay | Admitting: Obstetrics & Gynecology

## 2013-12-30 VITALS — BP 130/90 | Wt 343.0 lb

## 2013-12-30 DIAGNOSIS — N83209 Unspecified ovarian cyst, unspecified side: Secondary | ICD-10-CM

## 2013-12-30 NOTE — Progress Notes (Signed)
Patient ID: Judy Nelson, female   DOB: 1986/07/10, 28 y.o.   MRN: [id number] Pt with large 20 cm pelvic mass ?origin. Unsure Dx during pregnancy   For removal, laparoscopic 01/22/2014  Past Medical History  Diagnosis Date  . Ovarian cyst   . Obesity     Past Surgical History  Procedure Laterality Date  . No past surgeries      OB History   Grav Para Term Preterm Abortions TAB SAB Ect Mult Living   1 1 1       1       No Known Allergies  History   Social History  . Marital Status: Single    Spouse Name: N/A    Number of Children: N/A  . Years of Education: N/A   Social History Main Topics  . Smoking status: Former Smoker    Types: Cigarettes  . Smokeless tobacco: Never Used  . Alcohol Use: No  . Drug Use: No  . Sexual Activity: Not Currently    Birth Control/ Protection: None   Other Topics Concern  . None   Social History Narrative  . None    History reviewed. No pertinent family history.

## 2014-01-07 ENCOUNTER — Encounter (HOSPITAL_COMMUNITY): Payer: Self-pay | Admitting: Pharmacy Technician

## 2014-01-13 ENCOUNTER — Other Ambulatory Visit: Payer: Self-pay | Admitting: Obstetrics & Gynecology

## 2014-01-14 NOTE — Patient Instructions (Signed)
Judy Nelson  01/14/2014   Your procedure is scheduled on:  01/22/2014  Report to [facility] at  76  AM.  Call this number if you have problems the morning of surgery: [phone]   Remember:   Do not eat food or drink liquids after midnight.   Take these medicines the morning of surgery with A SIP OF WATER: maxzide, effexor   Do not wear jewelry, make-up or nail polish.  Do not wear lotions, powders, or perfumes.   Do not shave 48 hours prior to surgery. Men may shave face and neck.  Do not bring valuables to the hospital.  [facility] is not responsible for any belongings or valuables.               Contacts, dentures or bridgework may not be worn into surgery.  Leave suitcase in the car. After surgery it may be brought to your room.  For patients admitted to the hospital, discharge time is determined by your treatment team.               Patients discharged the day of surgery will not be allowed to drive home.  Name and phone number of your driver: family Special Instructions: Shower using CHG 2 nights before surgery and the night before surgery.  If you shower the day of surgery use CHG.  Use special wash - you have one bottle of CHG for all showers.  You should use approximately 1/3 of the bottle for each shower.   Please read over the following fact sheets that you were given: Pain Booklet, Coughing and Deep Breathing, Surgical Site Infection Prevention, Anesthesia Post-op Instructions and Care and Recovery After Surgery Ovarian Cystectomy Ovarian cystectomy is surgery to remove a fluid-filled sac (cyst) on an ovary. The ovaries are small organs that produce eggs in women. Various types of cysts can form on the ovaries. Most are not cancerous. Surgery may be done if a cyst is large or is causing symptoms such as pain. It may also be done for a cyst that is or might be cancerous. This surgery can be done using a laparoscopic technique or an open abdominal technique. The  laparoscopic technique involves smaller cuts (incisions) and a faster recovery time. The technique used will depend on your age, the type of cyst, and whether the cyst is cancerous. The laparoscopic technique is not used for a cancerous cyst. LET [facility] CARE PROVIDER KNOW ABOUT:   Any allergies you have.  All medicines you are taking, including vitamins, herbs, eye drops, creams, and over-the-counter medicines.  Previous problems you or members of your family have had with the use of anesthetics.  Any blood disorders you have.  Previous surgeries you have had.  Medical conditions you have.  Any chance you might be pregnant. RISKS AND COMPLICATIONS Generally, this is a safe procedure. However, as with any procedure, complications can occur. Possible complications include:  Excessive bleeding.  Infection.  Injury to other organs.  Blood clots.  Becoming incapable of getting pregnant (infertile). BEFORE THE PROCEDURE  Ask your health care provider about changing or stopping any regular medicines. Avoid taking aspirin, ibuprofen, or blood thinners as directed by your health care provider.  Do not eat or drink anything after midnight the night before surgery.  If you smoke, do not smoke for at least 2 weeks before your surgery.  Do not drink alcohol the day before your surgery.  Let your health care provider  know if you develop a cold or any infection before your surgery.  Arrange for someone to drive you home after the procedure or after your hospital stay. Also arrange for someone to help you with activities during recovery. PROCEDURE  Either a laparoscopic technique or an open abdominal technique may be used for this surgery.  Small monitors will be put on your body. They are used to check your heart, blood pressure, and oxygen level.   An IV access tube will be put into one of your veins. Medicine will be able to flow directly into your body through this IV tube.    You might be given a medicine to help you relax (sedative).   You will be given a medicine to make you sleep (general anesthetic). A breathing tube may be placed into your lungs during the procedure. Laparoscopic Technique  Several small cuts (incisions) are made in your abdomen. These are typically about 1 to 2 cm long.   Your abdomen will be filled with carbon dioxide gas so that it expands. This gives the surgeon more room to operate and makes your organs easier to see.   A thin, lighted tube with a tiny camera on the end (laparoscope) is put through one of the small incisions. The camera on the laparoscope sends a picture to a TV screen in the operating room. This gives the surgeon a good view inside your abdomen.   Hollow tubes are put through the other small incisions in your abdomen. The tools needed for the procedure are put through these tubes.  The ovary with the cyst is identified, and the cyst is removed. It is sent to the lab for testing. If it is cancer, both ovaries may need to be removed during a different surgery.  Tools are removed. The incisions are then closed with stitches or skin glue, and dressings may be applied. Open Abdominal Technique  A single large incision is made along your bikini line or in the middle of your lower abdomen.  The ovary with the cyst is identified, and the cyst is removed. It is sent to the lab for testing. If it is cancer, both ovaries may need to be removed during a different surgery.  The incision is then closed with stitches or staples. AFTER THE PROCEDURE   You will wake up from anesthesia and be taken to a recovery area.  If you had laparoscopic surgery, you may be able to go home the same day, or you may need to stay in the hospital overnight.  If you had open abdominal surgery, you will need to stay in the hospital for a few days.  Your IV access tube and catheter will be removed the first or second day, after you are  able to eat and drink enough.  You may be given medicine to relieve pain or to help you sleep.  You may be given an antibiotic medicine if needed. Document Released: 07/31/2007 Document Revised: 07/24/2013 Document Reviewed: 05/15/2013 [facility] Patient Information 2014 [place]. PATIENT INSTRUCTIONS POST-ANESTHESIA  IMMEDIATELY FOLLOWING SURGERY:  Do not drive or operate machinery for the first twenty four hours after surgery.  Do not make any important decisions for twenty four hours after surgery or while taking narcotic pain medications or sedatives.  If you develop intractable nausea and vomiting or a severe headache please notify your doctor immediately.  FOLLOW-UP:  Please make an appointment with your surgeon as instructed. You do not need to follow up with anesthesia  unless specifically instructed to do so.  WOUND CARE INSTRUCTIONS (if applicable):  Keep a dry clean dressing on the anesthesia/puncture wound site if there is drainage.  Once the wound has quit draining you may leave it open to air.  Generally you should leave the bandage intact for twenty four hours unless there is drainage.  If the epidural site drains for more than 36-48 hours please call the anesthesia department.  QUESTIONS?:  Please feel free to call your physician or the hospital operator if you have any questions, and they will be happy to assist you.

## 2014-01-15 ENCOUNTER — Encounter (HOSPITAL_COMMUNITY): Payer: Self-pay

## 2014-01-15 ENCOUNTER — Encounter (HOSPITAL_COMMUNITY)
Admission: RE | Admit: 2014-01-15 | Discharge: 2014-01-15 | Disposition: A | Discharge status: Home / Self Care | Payer: Medicaid Other | Source: Ambulatory Visit | Attending: Obstetrics & Gynecology | Admitting: Obstetrics & Gynecology

## 2014-01-15 DIAGNOSIS — Z862 Personal history of diseases of the blood and blood-forming organs and certain disorders involving the immune mechanism: Secondary | ICD-10-CM

## 2014-01-15 DIAGNOSIS — Z01812 Encounter for preprocedural laboratory examination: Secondary | ICD-10-CM | POA: Insufficient documentation

## 2014-01-15 DIAGNOSIS — Z01810 Encounter for preprocedural cardiovascular examination: Secondary | ICD-10-CM | POA: Insufficient documentation

## 2014-01-15 HISTORY — DX: Anxiety disorder, unspecified: F41.9

## 2014-01-15 HISTORY — DX: Depression, unspecified: F32.A

## 2014-01-15 HISTORY — DX: Major depressive disorder, single episode, unspecified: F32.9

## 2014-01-15 HISTORY — DX: Anemia, unspecified: D64.9

## 2014-01-15 HISTORY — DX: Essential (primary) hypertension: I10

## 2014-01-15 HISTORY — DX: Personal history of diseases of the blood and blood-forming organs and certain disorders involving the immune mechanism: Z86.2

## 2014-01-15 LAB — TYPE AND SCREEN
ABO/RH(D): O POS
Antibody Screen: NEGATIVE

## 2014-01-15 LAB — COMPREHENSIVE METABOLIC PANEL
ALK PHOS: 135 U/L — AB (ref 39–117)
ALT: 6 U/L (ref 0–35)
AST: 11 U/L (ref 0–37)
Albumin: 3.1 g/dL — ABNORMAL LOW (ref 3.5–5.2)
BUN: 9 mg/dL (ref 6–23)
CO2: 25 mEq/L (ref 19–32)
Calcium: 8.7 mg/dL (ref 8.4–10.5)
Chloride: 103 mEq/L (ref 96–112)
Creatinine, Ser: 0.7 mg/dL (ref 0.50–1.10)
GFR calc non Af Amer: >90 mL/min (ref >90)
GLUCOSE: 123 mg/dL — AB (ref 70–99)
POTASSIUM: 3.9 meq/L (ref 3.7–5.3)
Sodium: 140 mEq/L (ref 137–147)
TOTAL PROTEIN: 7.4 g/dL (ref 6.0–8.3)

## 2014-01-15 LAB — CBC
HEMATOCRIT: 28.4 % — AB (ref 36.0–46.0)
HEMOGLOBIN: 8.6 g/dL — AB (ref 12.0–15.0)
MCH: 21.8 pg — ABNORMAL LOW (ref 26.0–34.0)
MCHC: 30.3 g/dL (ref 30.0–36.0)
MCV: 72.1 fL — ABNORMAL LOW (ref 78.0–100.0)
Platelets: 266 10*3/uL (ref 150–400)
RBC: 3.94 MIL/uL (ref 3.87–5.11)
RDW: 18.1 % — ABNORMAL HIGH (ref 11.5–15.5)
WBC: 9 10*3/uL (ref 4.0–10.5)

## 2014-01-15 LAB — URINALYSIS, ROUTINE W REFLEX MICROSCOPIC
BILIRUBIN URINE: NEGATIVE
GLUCOSE, UA: NEGATIVE mg/dL
KETONES UR: NEGATIVE mg/dL
Nitrite: NEGATIVE
PH: 6.5 (ref 5.0–8.0)
Protein, ur: 100 mg/dL — AB
Urobilinogen, UA: 1 mg/dL (ref 0.0–1.0)

## 2014-01-15 LAB — HCG, QUANTITATIVE, PREGNANCY

## 2014-01-15 LAB — URINE MICROSCOPIC-ADD ON

## 2014-01-15 NOTE — Pre-Procedure Instructions (Signed)
Patient given information to sign up for my chart at home. 

## 2014-01-15 NOTE — Pre-Procedure Instructions (Signed)
Hgb- 8.6 and Hct 28.4 called to dr Elonda Husky. He does not want a cross match done.

## 2014-01-16 NOTE — Pre-Procedure Instructions (Signed)
Dr Patsey Berthold aware of H&H and wants patients labs to be at a better level before elective surgery. Called dr Brynda Greathouse office and spoke with Jerene Dilling about this matter. She is to relay this message to him when he arrives.

## 2014-01-20 ENCOUNTER — Other Ambulatory Visit: Payer: Self-pay | Admitting: Obstetrics & Gynecology

## 2014-01-21 ENCOUNTER — Telehealth: Payer: Self-pay | Admitting: Obstetrics & Gynecology

## 2014-01-21 ENCOUNTER — Inpatient Hospital Stay (HOSPITAL_COMMUNITY)
Admission: AD | Admit: 2014-01-21 | Discharge: 2014-01-21 | Disposition: A | Discharge status: Home / Self Care | Payer: Medicaid Other | Source: Ambulatory Visit | Attending: Family Medicine | Admitting: Family Medicine

## 2014-01-21 ENCOUNTER — Other Ambulatory Visit: Payer: Self-pay | Admitting: Obstetrics & Gynecology

## 2014-01-21 ENCOUNTER — Telehealth (HOSPITAL_COMMUNITY): Payer: Self-pay

## 2014-01-21 DIAGNOSIS — D649 Anemia, unspecified: Secondary | ICD-10-CM | POA: Insufficient documentation

## 2014-01-21 MED ORDER — SODIUM CHLORIDE 0.9 % IV SOLN
1020.0000 mg | Freq: Once | INTRAVENOUS | Status: AC
  Administered 2014-01-21: 1020 mg via INTRAVENOUS
  Filled 2014-01-21: qty 34

## 2014-01-21 NOTE — Telephone Encounter (Signed)
Pt informed that Dr. Elonda Husky has put orders in at [facility] for her to have iron infusion done tomorrow.  Pt was given [facility] to [facility] and instructed to call them for time and further instructions.  Pt verbalized understanding.

## 2014-01-22 ENCOUNTER — Ambulatory Visit (HOSPITAL_COMMUNITY)
Admission: RE | Admit: 2014-01-22 | Payer: Medicaid Other | Source: Ambulatory Visit | Admitting: Obstetrics & Gynecology

## 2014-01-22 ENCOUNTER — Encounter (HOSPITAL_COMMUNITY): Admission: RE | Payer: Self-pay | Source: Ambulatory Visit

## 2014-01-22 SURGERY — EXCISION, CYST, OVARY, LAPAROSCOPIC
Anesthesia: General | Site: Vagina | Laterality: Right

## 2014-01-29 ENCOUNTER — Encounter (INDEPENDENT_AMBULATORY_CARE_PROVIDER_SITE_OTHER): Payer: Medicaid Other | Admitting: Obstetrics & Gynecology

## 2014-01-29 ENCOUNTER — Ambulatory Visit (INDEPENDENT_AMBULATORY_CARE_PROVIDER_SITE_OTHER): Payer: Medicaid Other | Admitting: Obstetrics & Gynecology

## 2014-01-29 ENCOUNTER — Encounter: Payer: Self-pay | Admitting: Obstetrics & Gynecology

## 2014-01-29 DIAGNOSIS — D649 Anemia, unspecified: Secondary | ICD-10-CM

## 2014-01-29 DIAGNOSIS — N83209 Unspecified ovarian cyst, unspecified side: Secondary | ICD-10-CM

## 2014-01-29 LAB — POCT HEMOGLOBIN: Hemoglobin: 10.4 g/dL — AB (ref 12.2–16.2)

## 2014-01-30 NOTE — Pre-Procedure Instructions (Signed)
Patient had iron transfusion 4/7 and Hgb after that 10.4. Dr Bonnee Quin and Dr Elonda Husky ok with this last lab and want no further labwork done.

## 2014-01-31 ENCOUNTER — Encounter (HOSPITAL_COMMUNITY)
Admission: RE | Admit: 2014-01-31 | Discharge: 2014-01-31 | Disposition: A | Discharge status: Home / Self Care | Payer: Medicaid Other | Source: Ambulatory Visit | Attending: Obstetrics & Gynecology | Admitting: Obstetrics & Gynecology

## 2014-01-31 ENCOUNTER — Encounter (HOSPITAL_COMMUNITY): Payer: Self-pay | Admitting: Pharmacy Technician

## 2014-02-05 ENCOUNTER — Encounter (HOSPITAL_COMMUNITY)
Admission: RE | Disposition: A | Discharge status: Home / Self Care | Payer: Self-pay | Source: Ambulatory Visit | Attending: Obstetrics & Gynecology

## 2014-02-05 ENCOUNTER — Ambulatory Visit (HOSPITAL_COMMUNITY)
Admission: RE | Admit: 2014-02-05 | Discharge: 2014-02-05 | Disposition: A | Discharge status: Home / Self Care | Payer: Medicaid Other | Source: Ambulatory Visit | Attending: Obstetrics & Gynecology | Admitting: Obstetrics & Gynecology

## 2014-02-05 ENCOUNTER — Ambulatory Visit (HOSPITAL_COMMUNITY): Payer: Medicaid Other | Admitting: Anesthesiology

## 2014-02-05 ENCOUNTER — Encounter (HOSPITAL_COMMUNITY): Payer: Medicaid Other | Admitting: Anesthesiology

## 2014-02-05 ENCOUNTER — Encounter (HOSPITAL_COMMUNITY): Payer: Self-pay | Admitting: *Deleted

## 2014-02-05 DIAGNOSIS — F3289 Other specified depressive episodes: Secondary | ICD-10-CM | POA: Insufficient documentation

## 2014-02-05 DIAGNOSIS — D279 Benign neoplasm of unspecified ovary: Secondary | ICD-10-CM

## 2014-02-05 DIAGNOSIS — F329 Major depressive disorder, single episode, unspecified: Secondary | ICD-10-CM | POA: Insufficient documentation

## 2014-02-05 DIAGNOSIS — R109 Unspecified abdominal pain: Secondary | ICD-10-CM

## 2014-02-05 DIAGNOSIS — R1900 Intra-abdominal and pelvic swelling, mass and lump, unspecified site: Secondary | ICD-10-CM

## 2014-02-05 DIAGNOSIS — F17200 Nicotine dependence, unspecified, uncomplicated: Secondary | ICD-10-CM | POA: Insufficient documentation

## 2014-02-05 DIAGNOSIS — E669 Obesity, unspecified: Secondary | ICD-10-CM | POA: Insufficient documentation

## 2014-02-05 DIAGNOSIS — F411 Generalized anxiety disorder: Secondary | ICD-10-CM | POA: Insufficient documentation

## 2014-02-05 DIAGNOSIS — Z6841 Body Mass Index (BMI) 40.0 and over, adult: Secondary | ICD-10-CM | POA: Insufficient documentation

## 2014-02-05 DIAGNOSIS — I10 Essential (primary) hypertension: Secondary | ICD-10-CM | POA: Insufficient documentation

## 2014-02-05 HISTORY — PX: LAPAROSCOPIC OVARIAN CYSTECTOMY: SHX6248

## 2014-02-05 SURGERY — EXCISION, CYST, OVARY, LAPAROSCOPIC
Anesthesia: General | Site: Vagina | Laterality: Right

## 2014-02-05 MED ORDER — MIDAZOLAM HCL 2 MG/2ML IJ SOLN
INTRAMUSCULAR | Status: AC
  Filled 2014-02-05: qty 2

## 2014-02-05 MED ORDER — ONDANSETRON HCL 4 MG/2ML IJ SOLN
4.0000 mg | Freq: Once | INTRAMUSCULAR | Status: AC
  Administered 2014-02-05: 4 mg via INTRAVENOUS

## 2014-02-05 MED ORDER — ONDANSETRON HCL 4 MG/2ML IJ SOLN
INTRAMUSCULAR | Status: AC
  Filled 2014-02-05: qty 2

## 2014-02-05 MED ORDER — ROCURONIUM BROMIDE 50 MG/5ML IV SOLN
INTRAVENOUS | Status: AC
  Filled 2014-02-05: qty 1

## 2014-02-05 MED ORDER — ROCURONIUM BROMIDE 100 MG/10ML IV SOLN
INTRAVENOUS | Status: DC | PRN
  Administered 2014-02-05 (×2): 10 mg via INTRAVENOUS
  Administered 2014-02-05: 5 mg via INTRAVENOUS
  Administered 2014-02-05: 35 mg via INTRAVENOUS

## 2014-02-05 MED ORDER — LIDOCAINE HCL (CARDIAC) 20 MG/ML IV SOLN
INTRAVENOUS | Status: DC | PRN
  Administered 2014-02-05: 50 mg via INTRAVENOUS

## 2014-02-05 MED ORDER — BUPIVACAINE LIPOSOME 1.3 % IJ SUSP
20.0000 mL | Freq: Once | INTRAMUSCULAR | Status: DC
  Filled 2014-02-05: qty 20

## 2014-02-05 MED ORDER — FENTANYL CITRATE 0.05 MG/ML IJ SOLN
INTRAMUSCULAR | Status: AC
  Filled 2014-02-05: qty 2

## 2014-02-05 MED ORDER — GLYCOPYRROLATE 0.2 MG/ML IJ SOLN
INTRAMUSCULAR | Status: AC
  Filled 2014-02-05: qty 1

## 2014-02-05 MED ORDER — BUPIVACAINE LIPOSOME 1.3 % IJ SUSP
INTRAMUSCULAR | Status: DC | PRN
  Administered 2014-02-05: 20 mL

## 2014-02-05 MED ORDER — KETOROLAC TROMETHAMINE 10 MG PO TABS: 10.0000 mg | ORAL_TABLET | Freq: Three times a day (TID) | ORAL | Status: DC | PRN

## 2014-02-05 MED ORDER — SUCCINYLCHOLINE CHLORIDE 20 MG/ML IJ SOLN
INTRAMUSCULAR | Status: DC | PRN
  Administered 2014-02-05: 140 mg via INTRAVENOUS

## 2014-02-05 MED ORDER — GLYCOPYRROLATE 0.2 MG/ML IJ SOLN
INTRAMUSCULAR | Status: DC | PRN
  Administered 2014-02-05: .6 mg via INTRAVENOUS

## 2014-02-05 MED ORDER — MIDAZOLAM HCL 2 MG/2ML IJ SOLN
1.0000 mg | INTRAMUSCULAR | Status: AC | PRN
  Administered 2014-02-05 (×3): 2 mg via INTRAVENOUS

## 2014-02-05 MED ORDER — ONDANSETRON HCL 8 MG PO TABS: 8.0000 mg | ORAL_TABLET | Freq: Three times a day (TID) | ORAL | Status: DC | PRN

## 2014-02-05 MED ORDER — OXYCODONE-ACETAMINOPHEN 7.5-325 MG PO TABS: 1.0000 | ORAL_TABLET | Freq: Four times a day (QID) | ORAL | Status: DC | PRN

## 2014-02-05 MED ORDER — NEOSTIGMINE METHYLSULFATE 1 MG/ML IJ SOLN
INTRAMUSCULAR | Status: DC | PRN
  Administered 2014-02-05: 3 mg via INTRAVENOUS

## 2014-02-05 MED ORDER — ONDANSETRON HCL 4 MG/2ML IJ SOLN: 4.0000 mg | Freq: Once | INTRAMUSCULAR | Status: DC | PRN

## 2014-02-05 MED ORDER — LACTATED RINGERS IV SOLN
INTRAVENOUS | Status: DC
  Administered 2014-02-05 (×3): via INTRAVENOUS

## 2014-02-05 MED ORDER — SUCCINYLCHOLINE CHLORIDE 20 MG/ML IJ SOLN
INTRAMUSCULAR | Status: AC
  Filled 2014-02-05: qty 1

## 2014-02-05 MED ORDER — FENTANYL CITRATE 0.05 MG/ML IJ SOLN
INTRAMUSCULAR | Status: DC | PRN
  Administered 2014-02-05 (×6): 50 ug via INTRAVENOUS

## 2014-02-05 MED ORDER — DEXTROSE 5 % IV SOLN: 3.0000 g | INTRAVENOUS | Status: DC

## 2014-02-05 MED ORDER — PROPOFOL 10 MG/ML IV BOLUS
INTRAVENOUS | Status: AC
  Filled 2014-02-05: qty 20

## 2014-02-05 MED ORDER — 0.9 % SODIUM CHLORIDE (POUR BTL) OPTIME
TOPICAL | Status: DC | PRN
  Administered 2014-02-05: 500 mL

## 2014-02-05 MED ORDER — GLYCOPYRROLATE 0.2 MG/ML IJ SOLN
0.2000 mg | Freq: Once | INTRAMUSCULAR | Status: AC
  Administered 2014-02-05: 0.2 mg via INTRAVENOUS

## 2014-02-05 MED ORDER — FENTANYL CITRATE 0.05 MG/ML IJ SOLN
INTRAMUSCULAR | Status: AC
  Filled 2014-02-05: qty 5

## 2014-02-05 MED ORDER — CEFAZOLIN SODIUM 1-5 GM-% IV SOLN
1.0000 g | Freq: Once | INTRAVENOUS | Status: DC
  Filled 2014-02-05: qty 50

## 2014-02-05 MED ORDER — GLYCOPYRROLATE 0.2 MG/ML IJ SOLN
INTRAMUSCULAR | Status: AC
  Filled 2014-02-05: qty 3

## 2014-02-05 MED ORDER — PROPOFOL 10 MG/ML IV BOLUS
INTRAVENOUS | Status: DC | PRN
  Administered 2014-02-05: 200 mg via INTRAVENOUS

## 2014-02-05 MED ORDER — LIDOCAINE HCL (PF) 1 % IJ SOLN
INTRAMUSCULAR | Status: AC
  Filled 2014-02-05: qty 5

## 2014-02-05 MED ORDER — FENTANYL CITRATE 0.05 MG/ML IJ SOLN: 25.0000 ug | INTRAMUSCULAR | Status: DC | PRN

## 2014-02-05 MED ORDER — SODIUM CHLORIDE 0.9 % IR SOLN
Status: DC | PRN
  Administered 2014-02-05: 3000 mL

## 2014-02-05 MED ORDER — KETOROLAC TROMETHAMINE 30 MG/ML IJ SOLN
30.0000 mg | Freq: Once | INTRAMUSCULAR | Status: AC
  Administered 2014-02-05: 30 mg via INTRAVENOUS
  Filled 2014-02-05: qty 1

## 2014-02-05 MED ORDER — CEFAZOLIN SODIUM-DEXTROSE 2-3 GM-% IV SOLR
2.0000 g | Freq: Once | INTRAVENOUS | Status: AC
  Administered 2014-02-05: 2 g via INTRAVENOUS
  Administered 2014-02-05: 1 g via INTRAVENOUS
  Filled 2014-02-05: qty 50

## 2014-02-05 SURGICAL SUPPLY — 47 items
APPLIER CLIP ROT 10 11.4 M/L (STAPLE) ×2
BAG HAMPER (MISCELLANEOUS) ×2 IMPLANT
BLADE SURG SZ11 CARB STEEL (BLADE) ×2 IMPLANT
CLIP APPLIE ROT 10 11.4 M/L (STAPLE) ×1 IMPLANT
CLOTH BEACON ORANGE TIMEOUT ST (SAFETY) ×2 IMPLANT
COVER LIGHT HANDLE STERIS (MISCELLANEOUS) ×4 IMPLANT
ELECT REM PT RETURN 9FT ADLT (ELECTROSURGICAL) ×2
ELECTRODE REM PT RTRN 9FT ADLT (ELECTROSURGICAL) ×1 IMPLANT
FILTER SMOKE EVAC LAPAROSHD (FILTER) ×2 IMPLANT
FORMALIN 10 PREFIL 480ML (MISCELLANEOUS) ×2 IMPLANT
GLOVE BIOGEL PI IND STRL 8 (GLOVE) ×1 IMPLANT
GLOVE BIOGEL PI INDICATOR 8 (GLOVE) ×1
GLOVE ECLIPSE 6.5 STRL STRAW (GLOVE) ×2 IMPLANT
GLOVE ECLIPSE 8.0 STRL XLNG CF (GLOVE) ×2 IMPLANT
GLOVE EXAM NITRILE MD LF STRL (GLOVE) ×2 IMPLANT
GLOVE INDICATOR 7.0 STRL GRN (GLOVE) ×4 IMPLANT
GOWN STRL REUS W/TWL LRG LVL3 (GOWN DISPOSABLE) ×4 IMPLANT
GOWN STRL REUS W/TWL XL LVL3 (GOWN DISPOSABLE) ×2 IMPLANT
INST SET LAPROSCOPIC GYN AP (KITS) ×2 IMPLANT
IV NS IRRIG 3000ML ARTHROMATIC (IV SOLUTION) ×2 IMPLANT
KIT ROOM TURNOVER APOR (KITS) ×2 IMPLANT
MANIFOLD NEPTUNE II (INSTRUMENTS) ×2 IMPLANT
NEEDLE HYPO 18GX1.5 BLUNT FILL (NEEDLE) ×2 IMPLANT
NEEDLE HYPO 25X1 1.5 SAFETY (NEEDLE) ×2 IMPLANT
NEEDLE INSUFFLATION 120MM (ENDOMECHANICALS) ×2 IMPLANT
NEEDLE INSUFFLATION 14GA 120MM (NEEDLE) ×2 IMPLANT
NS IRRIG 1000ML POUR BTL (IV SOLUTION) ×2 IMPLANT
PACK PERI GYN (CUSTOM PROCEDURE TRAY) ×2 IMPLANT
PAD ARMBOARD 7.5X6 YLW CONV (MISCELLANEOUS) ×2 IMPLANT
POUCH SPECIMEN RETRIEVAL 10MM (ENDOMECHANICALS) ×2 IMPLANT
SCALPEL HARMONIC ACE (MISCELLANEOUS) ×2 IMPLANT
SET BASIN LINEN APH (SET/KITS/TRAYS/PACK) ×2 IMPLANT
SET TUBE IRRIG SUCTION NO TIP (IRRIGATION / IRRIGATOR) ×2 IMPLANT
SLEEVE ENDOPATH XCEL 5M (ENDOMECHANICALS) IMPLANT
SOLUTION ANTI FOG 6CC (MISCELLANEOUS) ×2 IMPLANT
SPONGE GAUZE 2X2 8PLY STRL LF (GAUZE/BANDAGES/DRESSINGS) ×6 IMPLANT
STAPLER VISISTAT 35W (STAPLE) ×2 IMPLANT
SUT VICRYL 0 UR6 27IN ABS (SUTURE) ×2 IMPLANT
SYR 20CC LL (SYRINGE) ×2 IMPLANT
SYRINGE 10CC LL (SYRINGE) ×2 IMPLANT
TAPE CLOTH SURG 4X10 WHT LF (GAUZE/BANDAGES/DRESSINGS) ×2 IMPLANT
TRAY FOLEY CATH 16FR SILVER (SET/KITS/TRAYS/PACK) ×2 IMPLANT
TROCAR ENDO BLADELESS 11MM (ENDOMECHANICALS) ×2 IMPLANT
TROCAR XCEL NON-BLD 5MMX100MML (ENDOMECHANICALS) ×2 IMPLANT
TROCAR XCEL UNIV SLVE 11M 100M (ENDOMECHANICALS) IMPLANT
TUBING INSUF HEATED (TUBING) ×2 IMPLANT
WARMER LAPAROSCOPE (MISCELLANEOUS) ×2 IMPLANT

## 2014-02-05 NOTE — Anesthesia Preprocedure Evaluation (Signed)
Anesthesia Evaluation  Patient identified by MRN, date of birth, ID band Patient awake    Reviewed: Allergy & Precautions, H&P , NPO status , Patient's Chart, lab work & pertinent test results  History of Anesthesia Complications (+) Emergence Delirium and history of anesthetic complications  Airway Mallampati: II TM Distance: >3 FB     Dental  (+) Teeth Intact   Pulmonary Current Smoker,  breath sounds clear to auscultation        Cardiovascular hypertension, Pt. on medications Rhythm:Regular Rate:Normal     Neuro/Psych PSYCHIATRIC DISORDERS Anxiety Depression    GI/Hepatic   Endo/Other  Morbid obesity  Renal/GU      Musculoskeletal   Abdominal   Peds  Hematology  (+) anemia ,   Anesthesia Other Findings   Reproductive/Obstetrics                           Anesthesia Physical Anesthesia Plan  ASA: III  Anesthesia Plan: General   Post-op Pain Management:    Induction: Intravenous, Rapid sequence and Cricoid pressure planned  Airway Management Planned: Oral ETT  Additional Equipment:   Intra-op Plan:   Post-operative Plan: Extubation in OR  Informed Consent: I have reviewed the patients History and Physical, chart, labs and discussed the procedure including the risks, benefits and alternatives for the proposed anesthesia with the patient or authorized representative who has indicated his/her understanding and acceptance.     Plan Discussed with:   Anesthesia Plan Comments: (BMI>50)        Anesthesia Quick Evaluation

## 2014-02-05 NOTE — Anesthesia Postprocedure Evaluation (Signed)
  Anesthesia Post-op Note  Patient: Judy Nelson  Procedure(s) Performed: Procedure(s): LAPAROSCOPIC OVARIAN CYSTECTOMY (Right)  Patient Location: PACU  Anesthesia Type:General  Level of Consciousness: awake, alert  and oriented  Airway and Oxygen Therapy: Patient Spontanous Breathing and Patient connected to face mask oxygen  Post-op Pain: mild  Post-op Assessment: Post-op Vital signs reviewed, Patient's Cardiovascular Status Stable, Respiratory Function Stable, Patent Airway and No signs of Nausea or vomiting  Post-op Vital Signs: Reviewed and stable  Last Vitals:  Filed Vitals:   02/05/14 1430  BP: 118/65  Pulse: 95  Temp:   Resp: 22    Complications: No apparent anesthesia complications

## 2014-02-05 NOTE — Discharge Instructions (Signed)
Laparoscopic Ovarian Torsion Surgery Care After Refer to this sheet in the next few weeks. These instructions provide you with information on caring for yourself after your procedure. Your caregiver may also give you more specific instructions. Your treatment has been planned according to current medical practices, but problems sometimes occur. Call your caregiver if you have any problems or questions after your procedure. HOME CARE INSTRUCTIONS  Take any medicine as directed by your caregiver. Follow the directions carefully.  Check your incisions every day.  Keep the incision area(s) dry. Ask your caregiver when it is safe to shower or bathe again.  Rest as much as possible for the next 3 days. Ask your caregiver when it is safe to go back to your normal activities.  Drink enough fluids to keep your urine clear or pale yellow.  Keep all follow-up appointments. Your caregiver will make sure you are healing the way you should be. SEEK MEDICAL CARE IF:   You have bleeding or discharge from your vagina.  You have pain in your abdomen.  You feel nauseous. SEEK IMMEDIATE MEDICAL CARE IF:   Your incision(s) becomes red, swollen, or tender.  Your incision(s) start(s) bleeding.  You have pus coming from any incision.  You have heavy or persistent vaginal bleeding or discharge.  You have severe or increased abdominal pain.  You cannot stop vomiting.  Your nausea will not go away.  You have a fever. MAKE SURE YOU:  Understand these instructions.  Watch your condition.  Get help right away if you are not doing well or get worse. Document Released: 09/22/2011 Document Revised: 12/26/2011 Document Reviewed: 09/22/2011 [facility] Patient Information 2014 [place], [place].

## 2014-02-05 NOTE — H&P (Signed)
Preoperative History and Physical  Judy Nelson is a 28 y.o. G1P1001 with Patient's last menstrual period was 01/15/2014. admitted for a laparoscopic removal of large, 19 x 17 cm simple cystic mass, questionable origin, possibly paratubal cyst or ovarian cyst.  Diagnosed during her pregnancy.    PMH:    Past Medical History  Diagnosis Date  . Ovarian cyst   . Obesity   . Anxiety   . Depression   . Anemia   . Hypertension   . Complication of anesthesia Patient stated she becomes rowdy    PSH:    History reviewed. No pertinent past surgical history.  POb/GynH:      OB History   Grav Para Term Preterm Abortions TAB SAB Ect Mult Living   1 1 1       1       SH:   History  Substance Use Topics  . Smoking status: Current Every Day Smoker -- 1.00 packs/day for 2 years    Types: Cigarettes  . Smokeless tobacco: Never Used  . Alcohol Use: Yes     Comment: once monthly    FH:   History reviewed. No pertinent family history.   Allergies: No Known Allergies  Medications:      Current facility-administered medications:bupivacaine liposome (EXPAREL) 1.3 % injection 266 mg, 20 mL, Infiltration, Once, Florian Buff, MD;  ceFAZolin (ANCEF) IVPB 1 g/50 mL premix, 1 g, Intravenous, Once, Florian Buff, MD;  ceFAZolin (ANCEF) IVPB 2 g/50 mL premix, 2 g, Intravenous, Once, Florian Buff, MD;  ketorolac (TORADOL) 30 MG/ML injection 30 mg, 30 mg, Intravenous, Once, Florian Buff, MD  Review of Systems:   Review of Systems  Constitutional: Negative for fever, chills, weight loss, malaise/fatigue and diaphoresis.  HENT: Negative for hearing loss, ear pain, nosebleeds, congestion, sore throat, neck pain, tinnitus and ear discharge.   Eyes: Negative for blurred vision, double vision, photophobia, pain, discharge and redness.  Respiratory: Negative for cough, hemoptysis, sputum production, shortness of breath, wheezing and stridor.   Cardiovascular: Negative for chest pain, palpitations,  orthopnea, claudication, leg swelling and PND.  Gastrointestinal: Positive for abdominal pain. Negative for heartburn, nausea, vomiting, diarrhea, constipation, blood in stool and melena.  Genitourinary: Negative for dysuria, urgency, frequency, hematuria and flank pain.  Musculoskeletal: Negative for myalgias, back pain, joint pain and falls.  Skin: Negative for itching and rash.  Neurological: Negative for dizziness, tingling, tremors, sensory change, speech change, focal weakness, seizures, loss of consciousness, weakness and headaches.  Endo/Heme/Allergies: Negative for environmental allergies and polydipsia. Does not bruise/bleed easily.  Psychiatric/Behavioral: Negative for depression, suicidal ideas, hallucinations, memory loss and substance abuse. The patient is not nervous/anxious and does not have insomnia.      PHYSICAL EXAM:  Blood pressure 139/88, pulse 93, temperature 98.1 F (36.7 C), temperature source Oral, resp. rate 24, last menstrual period 01/15/2014.    Vitals reviewed. Constitutional: She is oriented to person, place, and time. She appears well-developed and well-nourished.  HENT:  Head: Normocephalic and atraumatic.  Right Ear: External ear normal.  Left Ear: External ear normal.  Nose: Nose normal.  Mouth/Throat: Oropharynx is clear and moist.  Eyes: Conjunctivae and EOM are normal. Pupils are equal, round, and reactive to light. Right eye exhibits no discharge. Left eye exhibits no discharge. No scleral icterus.  Neck: Normal range of motion. Neck supple. No tracheal deviation present. No thyromegaly present.  Cardiovascular: Normal rate, regular rhythm, normal heart sounds and intact distal pulses.  Exam  reveals no gallop and no friction rub.   No murmur heard. Respiratory: Effort normal and breath sounds normal. No respiratory distress. She has no wheezes. She has no rales. She exhibits no tenderness.  GI: Soft. Bowel sounds are normal. She exhibits no  distension and no mass. There is tenderness. There is no rebound and no guarding.  Genitourinary:       Vulva is normal without lesions Vagina is pink moist without discharge Cervix normal in appearance and pap is normal Uterus is normal size shape contour Adnexa is r 19 x 17 cm cystic pelvic mass, ?origin ovarian vs paratubal Musculoskeletal: Normal range of motion. She exhibits no edema and no tenderness.  Neurological: She is alert and oriented to person, place, and time. She has normal reflexes. She displays normal reflexes. No cranial nerve deficit. She exhibits normal muscle tone. Coordination normal.  Skin: Skin is warm and dry. No rash noted. No erythema. No pallor.  Psychiatric: She has a normal mood and affect. Her behavior is normal. Judgment and thought content normal.    Labs: Results for orders placed in visit on 01/29/14 (from the past 336 hour(s))  POCT HEMOGLOBIN   Collection Time    01/29/14  3:06 PM      Result Value Ref Range   Hemoglobin 10.4 (*) 12.2 - 16.2 g/dL    EKG: Orders placed during the hospital encounter of 01/15/14  . EKG 12-LEAD  . EKG 12-LEAD    Imaging Studies: No results found.    Assessment: Patient Active Problem List   Diagnosis Date Noted  . Postpartum depression 12/23/2013  . Preeclampsia 11/01/2013  . Pelvic mass 04/15/2013  . Morbid obesity with BMI of 50.0-59.9, adult 04/15/2013    Plan: Laparoscopic removal of large pelvic mass  Florian Buff 02/05/2014 10:16 AM

## 2014-02-05 NOTE — Anesthesia Procedure Notes (Signed)
Procedure Name: Intubation Date/Time: 02/05/2014 11:13 AM Performed by: Tressie Stalker E Pre-anesthesia Checklist: Patient identified, Patient being monitored, Timeout performed, Emergency Drugs available and Suction available Patient Re-evaluated:Patient Re-evaluated prior to inductionOxygen Delivery Method: Circle System Utilized Preoxygenation: Pre-oxygenation with 100% oxygen Intubation Type: IV induction, Rapid sequence and Cricoid Pressure applied Ventilation: Mask ventilation without difficulty Laryngoscope Size: Mac and 3 Grade View: Grade I Tube type: Oral Tube size: 7.0 mm Number of attempts: 1 Airway Equipment and Method: stylet Placement Confirmation: ETT inserted through vocal cords under direct vision,  positive ETCO2 and breath sounds checked- equal and bilateral Secured at: 22 cm Tube secured with: Tape Dental Injury: Teeth and Oropharynx as per pre-operative assessment

## 2014-02-05 NOTE — Transfer of Care (Signed)
Immediate Anesthesia Transfer of Care Note  Patient: Judy Nelson  Procedure(s) Performed: Procedure(s): LAPAROSCOPIC OVARIAN CYSTECTOMY (Right)  Patient Location: PACU  Anesthesia Type:General  Level of Consciousness: awake, alert  and oriented  Airway & Oxygen Therapy: Patient Spontanous Breathing and Patient connected to face mask oxygen  Post-op Assessment: Report given to PACU RN  Post vital signs: Reviewed and stable  Complications: No apparent anesthesia complications

## 2014-02-06 ENCOUNTER — Encounter (HOSPITAL_COMMUNITY): Payer: Self-pay | Admitting: Obstetrics & Gynecology

## 2014-02-07 ENCOUNTER — Telehealth: Payer: Self-pay | Admitting: *Deleted

## 2014-02-07 NOTE — Telephone Encounter (Signed)
Pt wanted code for mychart, pt was switched up front and they gave pt the code.

## 2014-02-12 ENCOUNTER — Encounter: Payer: Self-pay | Admitting: Obstetrics & Gynecology

## 2014-02-12 ENCOUNTER — Ambulatory Visit (INDEPENDENT_AMBULATORY_CARE_PROVIDER_SITE_OTHER): Payer: Self-pay | Admitting: Obstetrics & Gynecology

## 2014-02-12 VITALS — BP 115/110

## 2014-02-12 DIAGNOSIS — R1900 Intra-abdominal and pelvic swelling, mass and lump, unspecified site: Secondary | ICD-10-CM

## 2014-02-12 DIAGNOSIS — Z9889 Other specified postprocedural states: Secondary | ICD-10-CM

## 2014-02-12 NOTE — Progress Notes (Signed)
Patient ID: Judy Nelson, female   DOB: 12-27-1985, 28 y.o.   MRN: [id number] 1 week post op from laparoscopic removal of large retroperitoneal mass Reviewed with Dr Donato Heinz, derfinately ovarian tissue in it, most likey a retroperitoneal remanat  3 incisions all clean dry intact Staples removed  follw up 1 year

## 2014-02-23 ENCOUNTER — Encounter: Payer: Self-pay | Admitting: Obstetrics & Gynecology

## 2014-02-23 DIAGNOSIS — D649 Anemia, unspecified: Secondary | ICD-10-CM | POA: Insufficient documentation

## 2014-02-23 NOTE — Progress Notes (Signed)
This encounter was created in error - please disregard.

## 2014-02-23 NOTE — Progress Notes (Signed)
Patient ID: Judy Nelson, female   DOB: 1986-04-27, 28 y.o.   MRN: [id number] Pt had IV iron and her hemoglobin is now acceptable Previous pre op orders are done All questions answered  Past Medical History  Diagnosis Date  . Ovarian cyst   . Obesity   . Anxiety   . Depression   . Anemia   . Hypertension   . Complication of anesthesia Patient stated she becomes rowdy    Past Surgical History  Procedure Laterality Date  . Laparoscopic ovarian cystectomy Right 02/05/2014    Procedure: LAPAROSCOPIC OVARIAN CYSTECTOMY;  Surgeon: Florian Buff, MD;  Location: [facility];  Service: Gynecology;  Laterality: Right;    OB History   Grav Para Term Preterm Abortions TAB SAB Ect Mult Living   1 1 1       1       No Known Allergies  History   Social History  . Marital Status: Single    Spouse Name: N/A    Number of Children: N/A  . Years of Education: N/A   Social History Main Topics  . Smoking status: Current Every Day Smoker -- 1.00 packs/day for 2 years    Types: Cigarettes  . Smokeless tobacco: Never Used  . Alcohol Use: Yes     Comment: once monthly  . Drug Use: No  . Sexual Activity: Not Currently    Birth Control/ Protection: None, IUD   Other Topics Concern  . Not on file   Social History Narrative  . No narrative on file    No family history on file.

## 2014-03-10 NOTE — Op Note (Signed)
Preoperative diagnosis:  1.  20 cm right paraovarian/paratubal cyst                                         2.  normal preoperative CA 125                                         3.  Persistent throughout pregnancy and post partum  Postoperative diagnosis:  Right retroperitoneal mass(not ovarian, not paratubal)  Procedure:  Laparoscopic excision of right retroperitoneal mass  Surgeon:  Jacelyn Grip MD  Anesthesia:  Gen. Endotracheal  Findings:    The patient was noted to have a large right pelvic mass which was diagnosed at the time of initial sonogram for her pregnancy.  The measurements varied from 17-20 cm, and it has persisted postpartum as well.  It appears to be a simple mass and the ovary is definitely seen separate from it.  For that reason preoperatively I thought this was most likely a paratubal or paraovarian cyst.  As it turns out this was a retroperitoneal cyst with its blood supply from that source.  The ovary and tube were not involved at all and did not contribute to its blood supply. The left ovary and tube were also normal.  The mass was used transverse to the pelvic brim.  There were no other intraperitoneal abnormalities noted  Description of operation:  The patient was taken to the operating room and placed in the supine position where she underwent general endotracheal anesthesia.  She was placed in the low lithotomy position.  She was prepped and draped in the usual sterile fashion and a Foley catheter was placed.  An incision was made in the umbilicus and a Veres needle was placed into the peritoneal cavity with one pass without difficulty.  The peritoneal cavity was then insufflated.  An 11 mm non-bladed direct visualization trocar was then used and placed into the peritoneal cavity with direct laparoscopic visualization again without difficulty.  Incisions were then made in the left lower quadrant and right lower quadrant.  A 42mm trocar was placed in the left lower  quadrant and a 5 mm trocar was placed in the right lower quadrant.  Both were done under direct visualization without difficulty.    The mass was quite large and came up almost to the bottom part of my trocar.  Spent probably about 15 minutes just suturing now but it was separate from the right ovary and tube and was in the retroperitoneal space.  I then made an incision using harmonic scalpel in the retroperitoneum and dissected the mass out using blunt dissection and hydro-dissection.  There was blood supply laying over top and this was handled using the Harmonic scalpel.  Since this was traversing the pelvic brim and made sure that the ureter was not involved.  Could not see the ureter at all this point in this area but could see it deep in the pelvis and noted that it must be posterior to the mass.  I then deflated the mass making a small incision and sucking the fluid out.  Certainly 5361 cc was removed which represented the contents of the mass.  Painstaking dissection was then performed and the now deflated mass was dissected off the right lateral pelvic  sidewall the retroperitoneal structures and the pelvic structures as well.  Again great care was taken to avoid the ureter.  At this point I could then a 5 the ureter as it went over the pelvic brim and it was posterior to the mass.  Again emphasizing the painstaking nature of the dissection insure hemostasis was achieved as well.  It probably took me about to 2-1/2 hours total to safely remove the mass from the retroperitoneal space.  Again the ovary and fallopian tube were on injury this process.  Retroperitoneal vasculature pelvic sidewall ureter were also uninjured during the process.   An Endo Catch was placed and the retroperitoneal mass was put in the bag and removed through the umbilical incision without difficulty.  The 5 mm scope had been placed in the left lower quadrant.  The surgical site was again all evaluated and the ureter were seen  parous tossing normally deep in the pelvis.  All pedicles were found to be hemostatic. The subcutaneous tissue was then reapproximated with 0 Vicryl. The skin was closed using skin staples.  The umbilical fascia was closed with a single 0 Vicryl suture.  The subcutaneous tissue was reapproximated loosely using 0 Vicryl.  The skin was closed using skin staples.  The patient was awakened from anesthesia and taken to the recovery room in good stable condition with all counts being correct x3.  The estimated blood loss for the procedure is minimal.The patient received Ancef and Toradol preoperatively.   She will be discharged from the recovery room time and seen next week for staple and suture removal.  EURE,LUTHER H

## 2014-05-20 ENCOUNTER — Ambulatory Visit (INDEPENDENT_AMBULATORY_CARE_PROVIDER_SITE_OTHER): Payer: Medicaid Other | Admitting: Obstetrics & Gynecology

## 2014-05-20 ENCOUNTER — Ambulatory Visit (INDEPENDENT_AMBULATORY_CARE_PROVIDER_SITE_OTHER): Payer: Medicaid Other | Admitting: Women's Health

## 2014-05-20 ENCOUNTER — Encounter: Payer: Self-pay | Admitting: Obstetrics & Gynecology

## 2014-05-20 VITALS — BP 138/82 | Ht 67.5 in | Wt 372.8 lb

## 2014-05-20 DIAGNOSIS — F329 Major depressive disorder, single episode, unspecified: Secondary | ICD-10-CM

## 2014-05-20 DIAGNOSIS — O99345 Other mental disorders complicating the puerperium: Secondary | ICD-10-CM

## 2014-05-20 DIAGNOSIS — F3289 Other specified depressive episodes: Secondary | ICD-10-CM

## 2014-05-20 DIAGNOSIS — F53 Postpartum depression: Secondary | ICD-10-CM

## 2014-05-20 MED ORDER — ALPRAZOLAM 0.5 MG PO TABS: 0.5000 mg | ORAL_TABLET | Freq: Every evening | ORAL | Status: DC | PRN

## 2014-05-20 MED ORDER — MEGESTROL ACETATE 40 MG PO TABS: ORAL_TABLET | ORAL | Status: DC

## 2014-05-20 MED ORDER — VENLAFAXINE HCL ER 150 MG PO CP24: ORAL_CAPSULE | ORAL | Status: DC

## 2014-06-19 ENCOUNTER — Ambulatory Visit (INDEPENDENT_AMBULATORY_CARE_PROVIDER_SITE_OTHER): Payer: Medicaid Other | Admitting: Obstetrics & Gynecology

## 2014-06-19 ENCOUNTER — Encounter: Payer: Self-pay | Admitting: Obstetrics & Gynecology

## 2014-06-19 VITALS — BP 110/70 | Wt 370.2 lb

## 2014-06-19 DIAGNOSIS — F329 Major depressive disorder, single episode, unspecified: Secondary | ICD-10-CM

## 2014-06-19 DIAGNOSIS — F32A Depression, unspecified: Secondary | ICD-10-CM | POA: Insufficient documentation

## 2014-06-19 DIAGNOSIS — F3289 Other specified depressive episodes: Secondary | ICD-10-CM

## 2014-06-19 NOTE — Progress Notes (Signed)
Patient ID: Judy Nelson, female   DOB: Oct 23, 1985, 28 y.o.   MRN: [id number] Pt is now in therapy which she says is hard but is going regularly Seems to be a little more balanced according to Mom  will continue on Effexor 150 BID and prn xanax  Encouraged to continue counselling and recommended [facility] by Max Lucado  Follow up 3 months
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
## 2014-07-30 ENCOUNTER — Telehealth: Payer: Self-pay | Admitting: *Deleted

## 2014-07-30 MED ORDER — ALPRAZOLAM 1 MG PO TABS: ORAL_TABLET | ORAL | Status: DC

## 2014-07-30 NOTE — Telephone Encounter (Signed)
Spoke with pt. Pt's daughter has to have open heart surgery on 08/07/14. Pt wants Xanax increased. She is not sleeping at all. Pt takes 1 tab at bedtime. Please advise. Thanks!!! [employer]

## 2014-08-18 ENCOUNTER — Encounter: Payer: Self-pay | Admitting: Obstetrics & Gynecology

## 2014-09-01 ENCOUNTER — Telehealth: Payer: Self-pay | Admitting: *Deleted

## 2014-09-01 MED ORDER — ALPRAZOLAM 1 MG PO TABS: ORAL_TABLET | ORAL | Status: DC

## 2014-09-01 NOTE — Telephone Encounter (Signed)
Pt requesting a refill on Xanax 1 mg.

## 2014-09-09 ENCOUNTER — Ambulatory Visit (INDEPENDENT_AMBULATORY_CARE_PROVIDER_SITE_OTHER): Payer: Medicaid Other | Admitting: Obstetrics & Gynecology

## 2014-09-09 ENCOUNTER — Encounter: Payer: Self-pay | Admitting: Obstetrics & Gynecology

## 2014-09-09 VITALS — BP 120/80 | Wt 380.6 lb

## 2014-09-09 DIAGNOSIS — F53 Postpartum depression: Secondary | ICD-10-CM

## 2014-09-09 DIAGNOSIS — N63 Unspecified lump in breast: Secondary | ICD-10-CM

## 2014-09-09 DIAGNOSIS — R0789 Other chest pain: Secondary | ICD-10-CM | POA: Diagnosis not present

## 2014-09-09 DIAGNOSIS — O99345 Other mental disorders complicating the puerperium: Secondary | ICD-10-CM

## 2014-09-09 MED ORDER — SILVER SULFADIAZINE 1 % EX CREA: TOPICAL_CREAM | CUTANEOUS | Status: DC

## 2014-09-09 MED ORDER — LORATADINE-PSEUDOEPHEDRINE ER 10-240 MG PO TB24: 1.0000 | ORAL_TABLET | Freq: Every day | ORAL | Status: DC

## 2014-09-18 ENCOUNTER — Ambulatory Visit: Payer: Medicaid Other | Admitting: Obstetrics & Gynecology

## 2014-09-23 ENCOUNTER — Other Ambulatory Visit (HOSPITAL_COMMUNITY): Payer: Self-pay | Admitting: Family Medicine

## 2014-09-23 DIAGNOSIS — N611 Abscess of the breast and nipple: Secondary | ICD-10-CM

## 2014-09-30 ENCOUNTER — Ambulatory Visit (HOSPITAL_COMMUNITY)
Admission: RE | Admit: 2014-09-30 | Discharge: 2014-09-30 | Disposition: A | Discharge status: Home / Self Care | Payer: Medicaid Other | Source: Ambulatory Visit | Attending: Family Medicine | Admitting: Family Medicine

## 2014-09-30 ENCOUNTER — Other Ambulatory Visit (HOSPITAL_COMMUNITY): Payer: Self-pay | Admitting: Family Medicine

## 2014-09-30 ENCOUNTER — Encounter (HOSPITAL_COMMUNITY): Payer: Self-pay

## 2014-09-30 ENCOUNTER — Telehealth: Payer: Self-pay | Admitting: *Deleted

## 2014-09-30 DIAGNOSIS — N61 Inflammatory disorders of breast: Secondary | ICD-10-CM | POA: Insufficient documentation

## 2014-09-30 DIAGNOSIS — N644 Mastodynia: Secondary | ICD-10-CM | POA: Insufficient documentation

## 2014-09-30 DIAGNOSIS — M7989 Other specified soft tissue disorders: Secondary | ICD-10-CM | POA: Insufficient documentation

## 2014-09-30 DIAGNOSIS — N611 Abscess of the breast and nipple: Secondary | ICD-10-CM

## 2014-09-30 MED ORDER — LIDOCAINE HCL (PF) 2 % IJ SOLN
INTRAMUSCULAR | Status: AC
  Administered 2014-09-30: 200 mg
  Filled 2014-09-30: qty 10

## 2014-09-30 NOTE — Telephone Encounter (Signed)
Pt states saw her primary care provider was told she had a breast infection and was sent for a needle biopsy. Had the biopsy done and is now in a lot of pain, PCP told pt she would not give her pain medication for f/u with the hospital that done the biopsy. Pt states she comes to Dr. Elonda Husky "for everything" and if he would be willing to prescribe something for pain. I explained to pt she really needed to go to her PCP who diagnosed her, referred her for the biopsy.   Pt states "could we ask Dr. Elonda Husky if he would be willing to give pain medication?"

## 2014-09-30 NOTE — Discharge Instructions (Addendum)

## 2014-10-01 MED ORDER — OXYCODONE-ACETAMINOPHEN 7.5-325 MG PO TABS: 1.0000 | ORAL_TABLET | Freq: Four times a day (QID) | ORAL | Status: DC | PRN

## 2014-10-01 MED ORDER — CEPHALEXIN 500 MG PO CAPS: 500.0000 mg | ORAL_CAPSULE | Freq: Three times a day (TID) | ORAL | Status: DC

## 2014-10-01 NOTE — Telephone Encounter (Signed)
Pt informed RX for percocet at front desk for pick up and Keflex e-scribed. Pt verbalized understanding.

## 2014-10-02 ENCOUNTER — Other Ambulatory Visit: Payer: Self-pay

## 2014-10-02 ENCOUNTER — Encounter (HOSPITAL_COMMUNITY): Payer: Self-pay | Admitting: Emergency Medicine

## 2014-10-02 ENCOUNTER — Encounter (HOSPITAL_COMMUNITY)
Admission: RE | Admit: 2014-10-02 | Discharge: 2014-10-02 | Disposition: A | Discharge status: Home / Self Care | Payer: Medicaid Other | Source: Ambulatory Visit | Attending: General Surgery | Admitting: General Surgery

## 2014-10-02 ENCOUNTER — Encounter (HOSPITAL_COMMUNITY): Payer: Self-pay

## 2014-10-02 ENCOUNTER — Emergency Department (HOSPITAL_COMMUNITY)
Admission: EM | Admit: 2014-10-02 | Discharge: 2014-10-02 | Disposition: A | Discharge status: Home / Self Care | Payer: Medicaid Other | Attending: Emergency Medicine | Admitting: Emergency Medicine

## 2014-10-02 ENCOUNTER — Telehealth: Payer: Self-pay | Admitting: Obstetrics & Gynecology

## 2014-10-02 DIAGNOSIS — Z8742 Personal history of other diseases of the female genital tract: Secondary | ICD-10-CM | POA: Insufficient documentation

## 2014-10-02 DIAGNOSIS — Z792 Long term (current) use of antibiotics: Secondary | ICD-10-CM | POA: Diagnosis not present

## 2014-10-02 DIAGNOSIS — Z793 Long term (current) use of hormonal contraceptives: Secondary | ICD-10-CM | POA: Insufficient documentation

## 2014-10-02 DIAGNOSIS — R000 Tachycardia, unspecified: Secondary | ICD-10-CM | POA: Diagnosis not present

## 2014-10-02 DIAGNOSIS — R631 Polydipsia: Secondary | ICD-10-CM | POA: Diagnosis not present

## 2014-10-02 DIAGNOSIS — Z720 Tobacco use: Secondary | ICD-10-CM | POA: Diagnosis not present

## 2014-10-02 DIAGNOSIS — E669 Obesity, unspecified: Secondary | ICD-10-CM | POA: Diagnosis not present

## 2014-10-02 DIAGNOSIS — Z79899 Other long term (current) drug therapy: Secondary | ICD-10-CM | POA: Insufficient documentation

## 2014-10-02 DIAGNOSIS — F419 Anxiety disorder, unspecified: Secondary | ICD-10-CM | POA: Diagnosis not present

## 2014-10-02 DIAGNOSIS — R739 Hyperglycemia, unspecified: Secondary | ICD-10-CM | POA: Diagnosis present

## 2014-10-02 DIAGNOSIS — Z01812 Encounter for preprocedural laboratory examination: Secondary | ICD-10-CM | POA: Insufficient documentation

## 2014-10-02 DIAGNOSIS — R350 Frequency of micturition: Secondary | ICD-10-CM | POA: Insufficient documentation

## 2014-10-02 DIAGNOSIS — R358 Other polyuria: Secondary | ICD-10-CM | POA: Insufficient documentation

## 2014-10-02 DIAGNOSIS — Z862 Personal history of diseases of the blood and blood-forming organs and certain disorders involving the immune mechanism: Secondary | ICD-10-CM | POA: Insufficient documentation

## 2014-10-02 DIAGNOSIS — F329 Major depressive disorder, single episode, unspecified: Secondary | ICD-10-CM | POA: Insufficient documentation

## 2014-10-02 LAB — BLOOD GAS, VENOUS
Acid-Base Excess: 0.2 mmol/L (ref 0.0–2.0)
Bicarbonate: 25.3 mEq/L — ABNORMAL HIGH (ref 20.0–24.0)
O2 Saturation: 52.5 %
PCO2 VEN: 48.6 mmHg (ref 45.0–50.0)
PO2 VEN: 32 mmHg (ref 30.0–45.0)
Patient temperature: 37
TCO2: 23.3 mmol/L (ref 0–100)
pH, Ven: 7.337 — ABNORMAL HIGH (ref 7.250–7.300)

## 2014-10-02 LAB — CBC WITH DIFFERENTIAL/PLATELET
BASOS ABS: 0 10*3/uL (ref 0.0–0.1)
Basophils Absolute: 0 10*3/uL (ref 0.0–0.1)
Basophils Relative: 0 % (ref 0–1)
Basophils Relative: 0 % (ref 0–1)
Eosinophils Absolute: 0.2 10*3/uL (ref 0.0–0.7)
Eosinophils Absolute: 0.2 10*3/uL (ref 0.0–0.7)
Eosinophils Relative: 2 % (ref 0–5)
Eosinophils Relative: 2 % (ref 0–5)
HCT: 36.7 % (ref 36.0–46.0)
HEMATOCRIT: 36.1 % (ref 36.0–46.0)
HEMOGLOBIN: 12.2 g/dL (ref 12.0–15.0)
Hemoglobin: 12.1 g/dL (ref 12.0–15.0)
LYMPHS PCT: 15 % (ref 12–46)
Lymphocytes Relative: 14 % (ref 12–46)
Lymphs Abs: 1.2 10*3/uL (ref 0.7–4.0)
Lymphs Abs: 1.3 10*3/uL (ref 0.7–4.0)
MCH: 28 pg (ref 26.0–34.0)
MCH: 28.3 pg (ref 26.0–34.0)
MCHC: 33.2 g/dL (ref 30.0–36.0)
MCHC: 33.5 g/dL (ref 30.0–36.0)
MCV: 84.4 fL (ref 78.0–100.0)
MCV: 84.3 fL (ref 78.0–100.0)
MONO ABS: 0.8 10*3/uL (ref 0.1–1.0)
MONOS PCT: 8 % (ref 3–12)
Monocytes Absolute: 0.7 10*3/uL (ref 0.1–1.0)
Monocytes Relative: 9 % (ref 3–12)
NEUTROS ABS: 6.5 10*3/uL (ref 1.7–7.7)
NEUTROS ABS: 6.5 10*3/uL (ref 1.7–7.7)
NEUTROS PCT: 76 % (ref 43–77)
Neutrophils Relative %: 74 % (ref 43–77)
Platelets: 132 10*3/uL — ABNORMAL LOW (ref 150–400)
Platelets: 132 10*3/uL — ABNORMAL LOW (ref 150–400)
RBC: 4.35 MIL/uL (ref 3.87–5.11)
RBC: 4.28 MIL/uL (ref 3.87–5.11)
RDW: 13.4 % (ref 11.5–15.5)
RDW: 13.4 % (ref 11.5–15.5)
WBC: 8.6 10*3/uL (ref 4.0–10.5)
WBC: 8.8 10*3/uL (ref 4.0–10.5)

## 2014-10-02 LAB — BASIC METABOLIC PANEL
ANION GAP: 15 (ref 5–15)
ANION GAP: 13 (ref 5–15)
BUN: 5 mg/dL — ABNORMAL LOW (ref 6–23)
BUN: 5 mg/dL — ABNORMAL LOW (ref 6–23)
CHLORIDE: 95 meq/L — AB (ref 96–112)
CHLORIDE: 94 meq/L — AB (ref 96–112)
CO2: 21 mEq/L (ref 19–32)
CO2: 24 meq/L (ref 19–32)
CREATININE: 0.75 mg/dL (ref 0.50–1.10)
CREATININE: 0.74 mg/dL (ref 0.50–1.10)
Calcium: 8.9 mg/dL (ref 8.4–10.5)
Calcium: 9.2 mg/dL (ref 8.4–10.5)
GFR calc Af Amer: >90 mL/min (ref >90)
GFR calc non Af Amer: >90 mL/min (ref >90)
GFR calc non Af Amer: >90 mL/min (ref >90)
Glucose, Bld: 534 mg/dL — ABNORMAL HIGH (ref 70–99)
Glucose, Bld: 445 mg/dL — ABNORMAL HIGH (ref 70–99)
Potassium: 3.6 mEq/L — ABNORMAL LOW (ref 3.7–5.3)
Potassium: 3.9 mEq/L (ref 3.7–5.3)
SODIUM: 131 meq/L — AB (ref 137–147)
SODIUM: 131 meq/L — AB (ref 137–147)

## 2014-10-02 LAB — URINALYSIS, ROUTINE W REFLEX MICROSCOPIC
Bilirubin Urine: NEGATIVE
Glucose, UA: >1000 mg/dL — AB
Ketones, ur: NEGATIVE mg/dL
Leukocytes, UA: NEGATIVE
NITRITE: NEGATIVE
Protein, ur: 30 mg/dL — AB
Specific Gravity, Urine: 1.02 (ref 1.005–1.030)
UROBILINOGEN UA: 0.2 mg/dL (ref 0.0–1.0)
pH: 6 (ref 5.0–8.0)

## 2014-10-02 LAB — URINE MICROSCOPIC-ADD ON

## 2014-10-02 LAB — CBG MONITORING, ED
GLUCOSE-CAPILLARY: 406 mg/dL — AB (ref 70–99)
GLUCOSE-CAPILLARY: 324 mg/dL — AB (ref 70–99)
Glucose-Capillary: 427 mg/dL — ABNORMAL HIGH (ref 70–99)

## 2014-10-02 LAB — HCG, SERUM, QUALITATIVE: PREG SERUM: NEGATIVE

## 2014-10-02 MED ORDER — METFORMIN HCL 500 MG PO TABS: 500.0000 mg | ORAL_TABLET | Freq: Two times a day (BID) | ORAL | Status: DC

## 2014-10-02 MED ORDER — INSULIN ASPART 100 UNIT/ML ~~LOC~~ SOLN
10.0000 [IU] | Freq: Once | SUBCUTANEOUS | Status: AC
  Administered 2014-10-02: 10 [IU] via SUBCUTANEOUS
  Filled 2014-10-02: qty 1

## 2014-10-02 MED ORDER — SODIUM CHLORIDE 0.9 % IV BOLUS (SEPSIS)
1000.0000 mL | Freq: Once | INTRAVENOUS | Status: AC
  Administered 2014-10-02: 1000 mL via INTRAVENOUS

## 2014-10-02 MED ORDER — SODIUM CHLORIDE 0.9 % IV SOLN
INTRAVENOUS | Status: DC
  Administered 2014-10-02: 125 mL/h via INTRAVENOUS

## 2014-10-02 NOTE — Patient Instructions (Addendum)
Judy Nelson  10/02/2014   Your procedure is scheduled on:  10/03/2014  Report to [name] at  1250 PM.  Call this number if you have problems the morning of surgery: [phone]   Remember:   Do not eat food or drink liquids after midnight.   Take these medicines the morning of surgery with A SIP OF WATER: Xanax, Megace, Claritin, Oxycodone, Effexor, Maxide   Do not wear jewelry, make-up or nail polish.  Do not wear lotions, powders, or perfumes. You may wear deodorant.  Do not shave 48 hours prior to surgery. Men may shave face and neck.  Do not bring valuables to the hospital.  [facility] is not responsible for any belongings or valuables.               Contacts, dentures or bridgework may not be worn into surgery.  Leave suitcase in the car. After surgery it may be brought to your room.  For patients admitted to the hospital, discharge time is determined by your                treatment team.               Patients discharged the day of surgery will not be allowed to drive  home.  Name and phone number of your driver:   Special Instructions: Shower using CHG 2 nights before surgery and the night before surgery.  If you shower the day of surgery use CHG.  Use special wash - you have one bottle of CHG for all showers.  You should use approximately 1/3 of the bottle for each shower.   Please read over the following fact sheets that you were given: Surgical Site Infection Prevention and Anesthesia Post-op Instructions   PATIENT INSTRUCTIONS POST-ANESTHESIA  IMMEDIATELY FOLLOWING SURGERY:  Do not drive or operate machinery for the first twenty four hours after surgery.  Do not make any important decisions for twenty four hours after surgery or while taking narcotic pain medications or sedatives.  If you develop intractable nausea and vomiting or a severe headache please notify your doctor immediately.  FOLLOW-UP:  Please make an appointment with your surgeon as instructed. You  do not need to follow up with anesthesia unless specifically instructed to do so.  WOUND CARE INSTRUCTIONS (if applicable):  Keep a dry clean dressing on the anesthesia/puncture wound site if there is drainage.  Once the wound has quit draining you may leave it open to air.  Generally you should leave the bandage intact for twenty four hours unless there is drainage.  If the epidural site drains for more than 36-48 hours please call the anesthesia department.  QUESTIONS?:  Please feel free to call your physician or the hospital operator if you have any questions, and they will be happy to assist you.      Incision and Drainage Incision and drainage is a procedure in which a sac-like structure (cystic structure) is opened and drained. The area to be drained usually contains material such as pus, fluid, or blood.  LET YOUR CAREGIVER KNOW ABOUT:   Allergies to medicine.  Medicines taken, including vitamins, herbs, eyedrops, over-the-counter medicines, and creams.  Use of steroids (by mouth or creams).  Previous problems with anesthetics or numbing medicines.  History of bleeding problems or blood clots.  Previous surgery.  Other health problems, including diabetes and kidney problems.  Possibility of pregnancy, if this applies. RISKS AND COMPLICATIONS  Pain.  Bleeding.  Scarring.  Infection. BEFORE THE PROCEDURE  You may need to have an ultrasound or other imaging tests to see how large or deep your cystic structure is. Blood tests may also be used to determine if you have an infection or how severe the infection is. You may need to have a tetanus shot. PROCEDURE  The affected area is cleaned with a cleaning fluid. The cyst area will then be numbed with a medicine (local anesthetic). A small incision will be made in the cystic structure. A syringe or catheter may be used to drain the contents of the cystic structure, or the contents may be squeezed out. The area will then be  flushed with a cleansing solution. After cleansing the area, it is often gently packed with a gauze or another wound dressing. Once it is packed, it will be covered with gauze and tape or some other type of wound dressing. AFTER THE PROCEDURE   Often, you will be allowed to go home right after the procedure.  You may be given antibiotic medicine to prevent or heal an infection.  If the area was packed with gauze or some other wound dressing, you will likely need to come back in 1 to 2 days to get it removed.  The area should heal in about 14 days. Document Released: 03/29/2001 Document Revised: 04/03/2012 Document Reviewed: 11/28/2011 [facility] Patient Information 2015 [employer], [place]. This information is not intended to replace advice given to you by your health care provider. Make sure you discuss any questions you have with your health care provider.

## 2014-10-02 NOTE — ED Provider Notes (Signed)
CSN: [id number]     Arrival date & time 10/02/14  1524 History  This chart was scribed for Judy Jacobsen, MD by Tula Nakayama, ED Scribe. This patient was seen in room [facility] and the patient's care was started at 3:57 PM.    Chief Complaint  Patient presents with  . Hyperglycemia   The history is provided by the patient. No language interpreter was used.    HPI Comments: Judy Nelson is a 28 y.o. female with a history of HTN who presents to the Emergency Department complaining of hyperglycemia. She notes increased thirst and increased frequency for the last week as associated symptoms. Pt was scheduled for I&D of an abscess on her left breast with Dr. Arnoldo Morale earlier today, but reports it was cancelled due to elevated blood sugar. According to the nursing note recorded 2 hours ago, her blood sugar measured 534. Pt notes that she only consumed water PTA in the ED. Pt denies a history of DM, but notes a family history of the disease on her father's side. She was tested for gestational diabetes last year. She also states she gained 60 lbs after giving birth last year. Pt reports that she eats sugary breakfasts and drinks 5-6 cans of soda each day.   PCP is Marcelle Overlie  Past Medical History  Diagnosis Date  . Ovarian cyst   . Obesity   . Anxiety   . Depression   . Anemia   . Hypertension   . Complication of anesthesia Patient stated she becomes rowdy   Past Surgical History  Procedure Laterality Date  . Laparoscopic ovarian cystectomy Right 02/05/2014    Procedure: LAPAROSCOPIC OVARIAN CYSTECTOMY;  Surgeon: Florian Buff, MD;  Location: [facility];  Service: Gynecology;  Laterality: Right;   Family History  Problem Relation Age of Onset  . Diabetes Father   . Hypertension Father   . Heart disease Daughter     heart murmur  . Cancer Maternal Grandmother     ovarian cancer  . Obesity Paternal Grandmother   . Diabetes Paternal Grandfather    History  Substance Use Topics  .  Smoking status: Current Every Day Smoker -- 1.00 packs/day for 2 years    Types: Cigarettes  . Smokeless tobacco: Never Used  . Alcohol Use: Yes     Comment: once monthly   OB History    Gravida Para Term Preterm AB TAB SAB Ectopic Multiple Living   1 1 1       1      Review of Systems  Endocrine: Positive for polydipsia and polyuria.  Genitourinary: Positive for frequency.  All other systems reviewed and are negative.     Allergies  Review of patient's allergies indicates no known allergies.  Home Medications   Prior to Admission medications   Medication Sig Start Date End Date Taking? Authorizing Provider  ALPRAZolam Duanne Moron) 0.5 MG tablet Take 1 tablet (0.5 mg total) by mouth at bedtime as needed for anxiety. Patient not taking: Reported on 09/09/2014 05/20/14   Florian Buff, MD  ALPRAZolam Duanne Moron) 1 MG tablet 1 twice a day 09/01/14   Florian Buff, MD  cephALEXin (KEFLEX) 500 MG capsule Take 1 capsule (500 mg total) by mouth 3 (three) times daily. 10/01/14   Florian Buff, MD  levonorgestrel (MIRENA) 20 MCG/24HR IUD 1 each by Intrauterine route once.    Historical Provider, MD  loratadine-pseudoephedrine (CLARITIN-D 24 HOUR) 10-240 MG per 24 hr tablet Take 1 tablet by  mouth daily. 09/09/14   Florian Buff, MD  megestrol (MEGACE) 40 MG tablet 3 a day for 5 days, 2 a day for 5 days, then 1 a day 05/20/14   Florian Buff, MD  oxyCODONE-acetaminophen (PERCOCET) 7.5-325 MG per tablet Take 1-2 tablets by mouth every 6 (six) hours as needed for pain. 10/01/14   Florian Buff, MD  silver sulfADIAZINE (SILVADENE) 1 % cream Use to area 2-3 times per day 09/09/14   Florian Buff, MD  triamterene-hydrochlorothiazide (MAXZIDE-25) 37.5-25 MG per tablet Take 1 tablet by mouth daily as needed (when bp is up.). 11/12/13   Florian Buff, MD  venlafaxine XR (EFFEXOR-XR) 150 MG 24 hr capsule Take 2 a day 05/20/14   Florian Buff, MD   BP 142/87 mmHg  Pulse 126  Temp(Src) 98.4 F (36.9 C) (Oral)   Resp 18  Ht 5\' 7"  (1.702 m)  Wt 380 lb (172.367 kg)  BMI 59.50 kg/m2  SpO2 100%  Breastfeeding? No Physical Exam  Constitutional: She is oriented to person, place, and time. She appears well-developed and well-nourished.  Non-toxic appearance. No distress.  HENT:  Head: Normocephalic and atraumatic.  Eyes: Conjunctivae, EOM and lids are normal. Pupils are equal, round, and reactive to light.  Neck: Normal range of motion. Neck supple. No tracheal deviation present. No thyroid mass present.  Cardiovascular: Regular rhythm and normal heart sounds.  Exam reveals no gallop.   No murmur heard. Tachycardic  Pulmonary/Chest: Effort normal and breath sounds normal. No stridor. No respiratory distress. She has no decreased breath sounds. She has no wheezes. She has no rhonchi. She has no rales.  Abdominal: Soft. Normal appearance and bowel sounds are normal. She exhibits no distension. There is no tenderness. There is no rebound and no CVA tenderness.  Musculoskeletal: Normal range of motion. She exhibits no edema or tenderness.  Neurological: She is alert and oriented to person, place, and time. She has normal strength. No cranial nerve deficit or sensory deficit. GCS eye subscore is 4. GCS verbal subscore is 5. GCS motor subscore is 6.  Skin: Skin is warm and dry. No abrasion and no rash noted.  Psychiatric: She has a normal mood and affect. Her speech is normal and behavior is normal.  Nursing note and vitals reviewed.   ED Course  Procedures (including critical care time) DIAGNOSTIC STUDIES: Oxygen Saturation is 100% on RA, normal by my interpretation.    COORDINATION OF CARE: 4:06 PM Discussed treatment plan with pt which includes lab work. Pt agreed to plan.  Labs Review Labs Reviewed  CBG MONITORING, ED - Abnormal; Notable for the following:    Glucose-Capillary 427 (*)    All other components within normal limits  CBC WITH DIFFERENTIAL  BASIC METABOLIC PANEL  BLOOD GAS, VENOUS   URINALYSIS, ROUTINE W REFLEX MICROSCOPIC    Imaging Review No results found.   EKG Interpretation None      MDM   Final diagnoses:  None    I personally performed the services described in this documentation, which was scribed in my presence. The recorded information has been reviewed and is accurate. '  8:07 PM Patient given IV fluids here and insulin and blood sugars now 324. She'll be started Glucophage and will follow-up with her doctor. She was given dietary information to follow a strict diabetic diet  Judy Jacobsen, MD 10/02/14 2007

## 2014-10-02 NOTE — ED Notes (Signed)
Patient had lab work done today for surgery (cyst on left breast). Patient called and told that she could not have the surgery because blood sugar was over 500. Patient reports she does not have history of diabetes. Patient does report frequency and extreme thirst this past week. Per patient has gained "a lot" of weight due to having a baby this year.

## 2014-10-02 NOTE — Discharge Instructions (Signed)
Hyperglycemia °Hyperglycemia occurs when the glucose (sugar) in your blood is too high. Hyperglycemia can happen for many reasons, but it most often happens to people who do not know they have diabetes or are not managing their diabetes properly.  °CAUSES  °Whether you have diabetes or not, there are other causes of hyperglycemia. Hyperglycemia can occur when you have diabetes, but it can also occur in other situations that you might not be as aware of, such as: °Diabetes °· If you have diabetes and are having problems controlling your blood glucose, hyperglycemia could occur because of some of the following reasons: °¨ Not following your meal plan. °¨ Not taking your diabetes medications or not taking it properly. °¨ Exercising less or doing less activity than you normally do. °¨ Being sick. °Pre-diabetes °· This cannot be ignored. Before people develop Type 2 diabetes, they almost always have "pre-diabetes." This is when your blood glucose levels are higher than normal, but not yet high enough to be diagnosed as diabetes. Research has shown that some long-term damage to the body, especially the heart and circulatory system, may already be occurring during pre-diabetes. If you take action to manage your blood glucose when you have pre-diabetes, you may delay or prevent Type 2 diabetes from developing. °Stress °· If you have diabetes, you may be "diet" controlled or on oral medications or insulin to control your diabetes. However, you may find that your blood glucose is higher than usual in the hospital whether you have diabetes or not. This is often referred to as "stress hyperglycemia." Stress can elevate your blood glucose. This happens because of hormones put out by the body during times of stress. If stress has been the cause of your high blood glucose, it can be followed regularly by your caregiver. That way he/she can make sure your hyperglycemia does not continue to get worse or progress to  diabetes. °Steroids °· Steroids are medications that act on the infection fighting system (immune system) to block inflammation or infection. One side effect can be a rise in blood glucose. Most people can produce enough extra insulin to allow for this rise, but for those who cannot, steroids make blood glucose levels go even higher. It is not unusual for steroid treatments to "uncover" diabetes that is developing. It is not always possible to determine if the hyperglycemia will go away after the steroids are stopped. A special blood test called an A1c is sometimes done to determine if your blood glucose was elevated before the steroids were started. °SYMPTOMS °· Thirsty. °· Frequent urination. °· Dry mouth. °· Blurred vision. °· Tired or fatigue. °· Weakness. °· Sleepy. °· Tingling in feet or leg. °DIAGNOSIS  °Diagnosis is made by monitoring blood glucose in one or all of the following ways: °· A1c test. This is a chemical found in your blood. °· Fingerstick blood glucose monitoring. °· Laboratory results. °TREATMENT  °First, knowing the cause of the hyperglycemia is important before the hyperglycemia can be treated. Treatment may include, but is not be limited to: °· Education. °· Change or adjustment in medications. °· Change or adjustment in meal plan. °· Treatment for an illness, infection, etc. °· More frequent blood glucose monitoring. °· Change in exercise plan. °· Decreasing or stopping steroids. °· Lifestyle changes. °HOME CARE INSTRUCTIONS  °· Test your blood glucose as directed. °· Exercise regularly. Your caregiver will give you instructions about exercise. Pre-diabetes or diabetes which comes on with stress is helped by exercising. °· Eat wholesome,   balanced meals. Eat often and at regular, fixed times. Your caregiver or nutritionist will give you a meal plan to guide your sugar intake. °· Being at an ideal weight is important. If needed, losing as little as 10 to 15 pounds may help improve blood  glucose levels. °SEEK MEDICAL CARE IF:  °· You have questions about medicine, activity, or diet. °· You continue to have symptoms (problems such as increased thirst, urination, or weight gain). °SEEK IMMEDIATE MEDICAL CARE IF:  °· You are vomiting or have diarrhea. °· Your breath smells fruity. °· You are breathing faster or slower. °· You are very sleepy or incoherent. °· You have numbness, tingling, or pain in your feet or hands. °· You have chest pain. °· Your symptoms get worse even though you have been following your caregiver's orders. °· If you have any other questions or concerns. °Document Released: 03/29/2001 Document Revised: 12/26/2011 Document Reviewed: 01/30/2012 °Exit[employer]® Patient Information ©2015 [employer]. This information is not intended to replace advice given to you by your health care provider. Make sure you discuss any questions you have with your health care provider. °Diabetes Mellitus and Food °It is important for you to manage your blood sugar (glucose) level. Your blood glucose level can be greatly affected by what you eat. Eating healthier foods in the appropriate amounts throughout the day at about the same time each day will help you control your blood glucose level. It can also help slow or prevent worsening of your diabetes mellitus. Healthy eating may even help you improve the level of your blood pressure and reach or maintain a healthy weight.  °HOW CAN FOOD AFFECT ME? °Carbohydrates °Carbohydrates affect your blood glucose level more than any other type of food. Your dietitian will help you determine how many carbohydrates to eat at each meal and teach you how to count carbohydrates. Counting carbohydrates is important to keep your blood glucose at a healthy level, especially if you are using insulin or taking certain medicines for diabetes mellitus. °Alcohol °Alcohol can cause sudden decreases in blood glucose (hypoglycemia), especially if you use insulin or take certain  medicines for diabetes mellitus. Hypoglycemia can be a life-threatening condition. Symptoms of hypoglycemia (sleepiness, dizziness, and disorientation) are similar to symptoms of having too much alcohol.  °If your health care provider has given you approval to drink alcohol, do so in moderation and use the following guidelines: °· Women should not have more than one drink per day, and men should not have more than two drinks per day. One drink is equal to: °¨ 12 oz of beer. °¨ 5 oz of wine. °¨ 1½ oz of hard liquor. °· Do not drink on an empty stomach. °· Keep yourself hydrated. Have water, diet soda, or unsweetened iced tea. °· Regular soda, juice, and other mixers might contain a lot of carbohydrates and should be counted. °WHAT FOODS ARE NOT RECOMMENDED? °As you make food choices, it is important to remember that all foods are not the same. Some foods have fewer nutrients per serving than other foods, even though they might have the same number of calories or carbohydrates. It is difficult to get your body what it needs when you eat foods with fewer nutrients. Examples of foods that you should avoid that are high in calories and carbohydrates but low in nutrients include: °· Trans fats (most processed foods list trans fats on the Nutrition Facts label). °· Regular soda. °· Juice. °· Candy. °· Sweets, such as cake, pie,   doughnuts, and cookies. °· Fried foods. °WHAT FOODS CAN I EAT? °Have nutrient-rich foods, which will nourish your body and keep you healthy. The food you should eat also will depend on several factors, including: °· The calories you need. °· The medicines you take. °· Your weight. °· Your blood glucose level. °· Your blood pressure level. °· Your cholesterol level. °You also should eat a variety of foods, including: °· Protein, such as meat, poultry, fish, tofu, nuts, and seeds (lean animal proteins are best). °· Fruits. °· Vegetables. °· Dairy products, such as milk, cheese, and yogurt (low fat is  best). °· Breads, grains, pasta, cereal, rice, and beans. °· Fats such as olive oil, trans fat-free margarine, canola oil, avocado, and olives. °DOES EVERYONE WITH DIABETES MELLITUS HAVE THE SAME MEAL PLAN? °Because every person with diabetes mellitus is different, there is not one meal plan that works for everyone. It is very important that you meet with a dietitian who will help you create a meal plan that is just right for you. °Document Released: 06/30/2005 Document Revised: 10/08/2013 Document Reviewed: 08/30/2013 °[employer]® Patient Information ©2015 [employer]. This information is not intended to replace advice given to you by your health care provider. Make sure you discuss any questions you have with your health care provider. ° °

## 2014-10-02 NOTE — Pre-Procedure Instructions (Signed)
HR 125, however her baseline HR is 100 and she is currently having 10/10 left breast pain.  Discussed with Dr Patsey Berthold and no ekg needed at this time.

## 2014-10-02 NOTE — OR Nursing (Signed)
Glucose 534  Reported to Dr. Arnoldo Morale , case cancelled until further work in regards to elevated  glucose

## 2014-10-03 ENCOUNTER — Telehealth: Payer: Self-pay | Admitting: Obstetrics & Gynecology

## 2014-10-03 ENCOUNTER — Encounter (HOSPITAL_COMMUNITY): Admission: RE | Payer: Self-pay | Source: Ambulatory Visit

## 2014-10-03 ENCOUNTER — Ambulatory Visit (HOSPITAL_COMMUNITY): Admission: RE | Admit: 2014-10-03 | Payer: Medicaid Other | Source: Ambulatory Visit | Admitting: General Surgery

## 2014-10-03 LAB — CULTURE, ROUTINE-ABSCESS: CULTURE: NO GROWTH

## 2014-10-03 SURGERY — INCISION AND DRAINAGE, ABSCESS
Anesthesia: General | Laterality: Left

## 2014-10-03 MED ORDER — ALPRAZOLAM 1 MG PO TABS: 1.0000 mg | ORAL_TABLET | Freq: Two times a day (BID) | ORAL | Status: DC

## 2014-10-05 ENCOUNTER — Inpatient Hospital Stay (HOSPITAL_COMMUNITY)
Admission: EM | Admit: 2014-10-05 | Discharge: 2014-10-08 | DRG: 600 | Disposition: A | Discharge status: Home / Self Care | Payer: Medicaid Other | Attending: Internal Medicine | Admitting: Internal Medicine

## 2014-10-05 ENCOUNTER — Encounter (HOSPITAL_COMMUNITY): Payer: Self-pay | Admitting: Emergency Medicine

## 2014-10-05 DIAGNOSIS — N644 Mastodynia: Secondary | ICD-10-CM | POA: Diagnosis present

## 2014-10-05 DIAGNOSIS — F329 Major depressive disorder, single episode, unspecified: Secondary | ICD-10-CM | POA: Diagnosis present

## 2014-10-05 DIAGNOSIS — Z79899 Other long term (current) drug therapy: Secondary | ICD-10-CM | POA: Diagnosis not present

## 2014-10-05 DIAGNOSIS — F17210 Nicotine dependence, cigarettes, uncomplicated: Secondary | ICD-10-CM | POA: Diagnosis present

## 2014-10-05 DIAGNOSIS — Z6841 Body Mass Index (BMI) 40.0 and over, adult: Secondary | ICD-10-CM | POA: Diagnosis not present

## 2014-10-05 DIAGNOSIS — E860 Dehydration: Secondary | ICD-10-CM | POA: Diagnosis present

## 2014-10-05 DIAGNOSIS — Z7951 Long term (current) use of inhaled steroids: Secondary | ICD-10-CM | POA: Diagnosis not present

## 2014-10-05 DIAGNOSIS — I10 Essential (primary) hypertension: Secondary | ICD-10-CM | POA: Diagnosis present

## 2014-10-05 DIAGNOSIS — N611 Abscess of the breast and nipple: Secondary | ICD-10-CM | POA: Diagnosis present

## 2014-10-05 DIAGNOSIS — E876 Hypokalemia: Secondary | ICD-10-CM | POA: Diagnosis present

## 2014-10-05 DIAGNOSIS — E119 Type 2 diabetes mellitus without complications: Secondary | ICD-10-CM

## 2014-10-05 DIAGNOSIS — N61 Inflammatory disorders of breast: Principal | ICD-10-CM | POA: Diagnosis present

## 2014-10-05 DIAGNOSIS — F419 Anxiety disorder, unspecified: Secondary | ICD-10-CM | POA: Diagnosis present

## 2014-10-05 DIAGNOSIS — E1165 Type 2 diabetes mellitus with hyperglycemia: Secondary | ICD-10-CM | POA: Diagnosis present

## 2014-10-05 LAB — BASIC METABOLIC PANEL
Anion gap: 14 (ref 5–15)
BUN: 5 mg/dL — AB (ref 6–23)
CO2: 26 mEq/L (ref 19–32)
Calcium: 9.2 mg/dL (ref 8.4–10.5)
Chloride: 97 mEq/L (ref 96–112)
Creatinine, Ser: 0.72 mg/dL (ref 0.50–1.10)
GFR calc non Af Amer: >90 mL/min (ref >90)
GLUCOSE: 162 mg/dL — AB (ref 70–99)
POTASSIUM: 3 meq/L — AB (ref 3.7–5.3)
Sodium: 137 mEq/L (ref 137–147)

## 2014-10-05 LAB — CREATININE, SERUM
CREATININE: 0.67 mg/dL (ref 0.50–1.10)
GFR calc Af Amer: >90 mL/min (ref >90)

## 2014-10-05 LAB — URINALYSIS, ROUTINE W REFLEX MICROSCOPIC
Bilirubin Urine: NEGATIVE
GLUCOSE, UA: 100 mg/dL — AB
Ketones, ur: NEGATIVE mg/dL
Leukocytes, UA: NEGATIVE
Nitrite: NEGATIVE
PH: 7 (ref 5.0–8.0)
PROTEIN: 100 mg/dL — AB
SPECIFIC GRAVITY, URINE: 1.015 (ref 1.005–1.030)
Urobilinogen, UA: 0.2 mg/dL (ref 0.0–1.0)

## 2014-10-05 LAB — CBC
HCT: 29.6 % — ABNORMAL LOW (ref 36.0–46.0)
Hemoglobin: 10 g/dL — ABNORMAL LOW (ref 12.0–15.0)
MCH: 28.4 pg (ref 26.0–34.0)
MCHC: 33.8 g/dL (ref 30.0–36.0)
MCV: 84.1 fL (ref 78.0–100.0)
PLATELETS: 149 10*3/uL — AB (ref 150–400)
RBC: 3.52 MIL/uL — ABNORMAL LOW (ref 3.87–5.11)
RDW: 13.8 % (ref 11.5–15.5)
WBC: 15.1 10*3/uL — ABNORMAL HIGH (ref 4.0–10.5)

## 2014-10-05 LAB — CBC WITH DIFFERENTIAL/PLATELET
Basophils Absolute: 0 10*3/uL (ref 0.0–0.1)
Basophils Relative: 0 % (ref 0–1)
EOS ABS: 0.2 10*3/uL (ref 0.0–0.7)
Eosinophils Relative: 1 % (ref 0–5)
HCT: 31.7 % — ABNORMAL LOW (ref 36.0–46.0)
Hemoglobin: 10.6 g/dL — ABNORMAL LOW (ref 12.0–15.0)
LYMPHS ABS: 1.9 10*3/uL (ref 0.7–4.0)
LYMPHS PCT: 14 % (ref 12–46)
MCH: 28.3 pg (ref 26.0–34.0)
MCHC: 33.4 g/dL (ref 30.0–36.0)
MCV: 84.8 fL (ref 78.0–100.0)
MONOS PCT: 9 % (ref 3–12)
Monocytes Absolute: 1.2 10*3/uL — ABNORMAL HIGH (ref 0.1–1.0)
NEUTROS PCT: 76 % (ref 43–77)
Neutro Abs: 9.7 10*3/uL — ABNORMAL HIGH (ref 1.7–7.7)
Platelets: 149 10*3/uL — ABNORMAL LOW (ref 150–400)
RBC: 3.74 MIL/uL — AB (ref 3.87–5.11)
RDW: 13.8 % (ref 11.5–15.5)
WBC: 13 10*3/uL — ABNORMAL HIGH (ref 4.0–10.5)

## 2014-10-05 LAB — GLUCOSE, CAPILLARY
GLUCOSE-CAPILLARY: 183 mg/dL — AB (ref 70–99)
Glucose-Capillary: 183 mg/dL — ABNORMAL HIGH (ref 70–99)

## 2014-10-05 LAB — URINE MICROSCOPIC-ADD ON

## 2014-10-05 LAB — CBG MONITORING, ED: Glucose-Capillary: 161 mg/dL — ABNORMAL HIGH (ref 70–99)

## 2014-10-05 MED ORDER — ACETAMINOPHEN 325 MG PO TABS: 650.0000 mg | ORAL_TABLET | Freq: Four times a day (QID) | ORAL | Status: DC | PRN

## 2014-10-05 MED ORDER — ONDANSETRON HCL 4 MG/2ML IJ SOLN: 4.0000 mg | Freq: Four times a day (QID) | INTRAMUSCULAR | Status: DC | PRN

## 2014-10-05 MED ORDER — PIPERACILLIN-TAZOBACTAM 3.375 G IVPB 30 MIN: 3.3750 g | Freq: Once | INTRAVENOUS | Status: DC

## 2014-10-05 MED ORDER — ONDANSETRON HCL 4 MG PO TABS: 4.0000 mg | ORAL_TABLET | Freq: Four times a day (QID) | ORAL | Status: DC | PRN

## 2014-10-05 MED ORDER — VANCOMYCIN HCL IN DEXTROSE 1-5 GM/200ML-% IV SOLN
1000.0000 mg | Freq: Once | INTRAVENOUS | Status: AC
  Administered 2014-10-05: 1000 mg via INTRAVENOUS
  Filled 2014-10-05: qty 200

## 2014-10-05 MED ORDER — POTASSIUM CHLORIDE IN NACL 20-0.9 MEQ/L-% IV SOLN
INTRAVENOUS | Status: DC
  Administered 2014-10-05 – 2014-10-07 (×4): via INTRAVENOUS

## 2014-10-05 MED ORDER — OXYCODONE-ACETAMINOPHEN 7.5-325 MG PO TABS
1.0000 | ORAL_TABLET | Freq: Four times a day (QID) | ORAL | Status: DC | PRN
Start: 2014-10-05 — End: 2014-10-05

## 2014-10-05 MED ORDER — OXYCODONE-ACETAMINOPHEN 5-325 MG PO TABS
1.0000 | ORAL_TABLET | Freq: Four times a day (QID) | ORAL | Status: DC | PRN
  Administered 2014-10-05 – 2014-10-07 (×6): 2 via ORAL
  Administered 2014-10-07 (×2): 1 via ORAL
  Administered 2014-10-08: 2 via ORAL
  Administered 2014-10-08: 1 via ORAL
  Filled 2014-10-05 (×8): qty 2
  Filled 2014-10-05 (×2): qty 1

## 2014-10-05 MED ORDER — VENLAFAXINE HCL ER 75 MG PO CP24
300.0000 mg | ORAL_CAPSULE | Freq: Every day | ORAL | Status: DC
  Administered 2014-10-06 – 2014-10-08 (×2): 300 mg via ORAL
  Filled 2014-10-05 (×2): qty 4

## 2014-10-05 MED ORDER — VANCOMYCIN HCL 10 G IV SOLR
1500.0000 mg | Freq: Three times a day (TID) | INTRAVENOUS | Status: DC
  Filled 2014-10-05 (×2): qty 1500

## 2014-10-05 MED ORDER — PIPERACILLIN-TAZOBACTAM 3.375 G IVPB
INTRAVENOUS | Status: AC
  Filled 2014-10-05: qty 100

## 2014-10-05 MED ORDER — INSULIN ASPART 100 UNIT/ML ~~LOC~~ SOLN
0.0000 [IU] | Freq: Three times a day (TID) | SUBCUTANEOUS | Status: DC
  Administered 2014-10-06: 3 [IU] via SUBCUTANEOUS
  Administered 2014-10-06: 2 [IU] via SUBCUTANEOUS
  Administered 2014-10-06 – 2014-10-07 (×2): 3 [IU] via SUBCUTANEOUS
  Administered 2014-10-08 (×2): 2 [IU] via SUBCUTANEOUS

## 2014-10-05 MED ORDER — VANCOMYCIN HCL IN DEXTROSE 1-5 GM/200ML-% IV SOLN: 1000.0000 mg | Freq: Once | INTRAVENOUS | Status: DC

## 2014-10-05 MED ORDER — INSULIN ASPART 100 UNIT/ML ~~LOC~~ SOLN: 0.0000 [IU] | Freq: Every day | SUBCUTANEOUS | Status: DC

## 2014-10-05 MED ORDER — MEGESTROL ACETATE 40 MG PO TABS
40.0000 mg | ORAL_TABLET | Freq: Every day | ORAL | Status: DC
  Administered 2014-10-06 – 2014-10-08 (×2): 40 mg via ORAL
  Filled 2014-10-05 (×8): qty 1

## 2014-10-05 MED ORDER — SODIUM CHLORIDE 0.9 % IV BOLUS (SEPSIS)
1000.0000 mL | Freq: Once | INTRAVENOUS | Status: AC
  Administered 2014-10-05: 1000 mL via INTRAVENOUS

## 2014-10-05 MED ORDER — HEPARIN SODIUM (PORCINE) 5000 UNIT/ML IJ SOLN
5000.0000 [IU] | Freq: Three times a day (TID) | INTRAMUSCULAR | Status: DC
  Administered 2014-10-05 – 2014-10-06 (×3): 5000 [IU] via SUBCUTANEOUS
  Filled 2014-10-05 (×3): qty 1

## 2014-10-05 MED ORDER — OXYCODONE HCL 5 MG PO TABS
2.5000 mg | ORAL_TABLET | Freq: Four times a day (QID) | ORAL | Status: DC | PRN
  Administered 2014-10-05 – 2014-10-08 (×9): 5 mg via ORAL
  Filled 2014-10-05 (×9): qty 1

## 2014-10-05 MED ORDER — ACETAMINOPHEN 650 MG RE SUPP: 650.0000 mg | Freq: Four times a day (QID) | RECTAL | Status: DC | PRN

## 2014-10-05 MED ORDER — ALPRAZOLAM 1 MG PO TABS
1.0000 mg | ORAL_TABLET | Freq: Two times a day (BID) | ORAL | Status: DC
  Administered 2014-10-05 – 2014-10-08 (×5): 1 mg via ORAL
  Filled 2014-10-05 (×5): qty 1

## 2014-10-05 MED ORDER — PIPERACILLIN-TAZOBACTAM 3.375 G IVPB
3.3750 g | Freq: Three times a day (TID) | INTRAVENOUS | Status: DC
  Administered 2014-10-05 – 2014-10-08 (×7): 3.375 g via INTRAVENOUS
  Filled 2014-10-05 (×24): qty 50

## 2014-10-05 NOTE — ED Notes (Signed)
Attempted x1 for IV access without success

## 2014-10-05 NOTE — Progress Notes (Addendum)
ANTIBIOTIC CONSULT NOTE  Pharmacy Consult for Vancomycin/Zosyn Indication: Breast Abscess  No Known Allergies  Patient Measurements: Height: 5\' 7"  (170.2 cm) Weight: (!) 380 lb (172.367 kg) IBW/kg (Calculated) : 61.6  Vital Signs: Temp: 98.8 F (37.1 C) (12/20 1204) Temp Source: Oral (12/20 1204) BP: 145/83 mmHg (12/20 1204) Pulse Rate: 125 (12/20 1204) Intake/Output from previous day:   Intake/Output from this shift:    Labs:  Recent Labs  10/02/14 1608 10/05/14 1225  WBC 8.8 13.0*  HGB 12.1 10.6*  PLT 132* 149*  CREATININE 0.74 0.72   Estimated Creatinine Clearance: 175 mL/min (by C-G formula based on Cr of 0.72). No results for input(s): VANCOTROUGH, VANCOPEAK, VANCORANDOM, GENTTROUGH, GENTPEAK, GENTRANDOM, TOBRATROUGH, TOBRAPEAK, TOBRARND, AMIKACINPEAK, AMIKACINTROU, AMIKACIN in the last 72 hours.   Microbiology: Recent Results (from the past 720 hour(s))  Culture, routine-abscess     Status: None   Collection Time: 09/30/14 10:20 AM  Result Value Ref Range Status   Specimen Description ABSCESS ASPIRATE LEFT BREAST  Final   Special Requests DOXYCYCLINE q/4days  Final   Gram Stain   Final    ABUNDANT WBC PRESENT,BOTH PMN AND MONONUCLEAR NO SQUAMOUS EPITHELIAL CELLS SEEN FEW GRAM POSITIVE COCCI IN PAIRS IN CLUSTERS Performed at [facility]    Culture   Final    NO GROWTH 3 DAYS Performed at [facility]    Report Status 10/03/2014 FINAL  Final    Medical History: Past Medical History  Diagnosis Date  . Ovarian cyst   . Obesity   . Anxiety   . Depression   . Anemia   . Hypertension   . Complication of anesthesia Patient stated she becomes rowdy    Medications:   (Not in a hospital admission)  Home meds reviewed.  Assessment: Okay for Protocol, Obesity/Normalized CrCl dosing protocol will be initiate with an estimated normalized CrCl = 132 ml/min.  Recent elevated blood sugar to be corrected/monitored prior to  surgery.  Goal of Therapy:  Vancomycin trough level 10-15 mcg/ml  Plan:  Vancomycin 2gm load, followed by 1500mg  IV every 8 hours. Measure antibiotic drug levels at steady state Follow up culture results Zosyn 3.375gm IV every 8 hours. Follow-up micro data, labs, vitals.  Biagio Quint R 10/05/2014,2:23 PM

## 2014-10-05 NOTE — ED Provider Notes (Signed)
CSN: [id number]     Arrival date & time 10/05/14  1154 History  This chart was scribed for Nat Christen, MD by Stephania Fragmin, ED Scribe. This patient was seen in room APA12/APA12 and the patient's care was started at 1:07 PM.    Chief Complaint  Patient presents with  . Hyperglycemia  . Wound Infection    The history is provided by the patient. No language interpreter was used.     HPI Comments: Judy Nelson is a 28 y.o. female who presents to the Emergency Department complaining of hyperglycemia and worsening pain on her left breast. She states that she had a surgery planned 2 days ago for treatment of the infection on her left breast. However, her sugar level was noted to be 589 during a pre-op the day before. Dr. Arnoldo Morale, her surgeon, instructed her to be admitted to the hospital for the next few days to monitor her blood sugar and make sure that she would be ready for surgery tomorrow or the day after. Patient denies a history of DM. She was given metformin 500 mg twice a day, which she took both of yesterday and once today. Patient measured her blood sugar to be 361, 280, and 153 respectively, in the past 2 days. Patient was given Percocet for her left breast pain, which she hasn't taken yet today. Dr. Marcelle Overlie in [place], [place] is her PCP.   Past Medical History  Diagnosis Date  . Ovarian cyst   . Obesity   . Anxiety   . Depression   . Anemia   . Hypertension   . Complication of anesthesia Patient stated she becomes rowdy   Past Surgical History  Procedure Laterality Date  . Laparoscopic ovarian cystectomy Right 02/05/2014    Procedure: LAPAROSCOPIC OVARIAN CYSTECTOMY;  Surgeon: Florian Buff, MD;  Location: [facility];  Service: Gynecology;  Laterality: Right;   Family History  Problem Relation Age of Onset  . Diabetes Father   . Hypertension Father   . Heart disease Daughter     heart murmur  . Cancer Maternal Grandmother     ovarian cancer  . Obesity Paternal Grandmother   .  Diabetes Paternal Grandfather    History  Substance Use Topics  . Smoking status: Current Every Day Smoker -- 1.00 packs/day for 2 years    Types: Cigarettes  . Smokeless tobacco: Never Used  . Alcohol Use: Yes     Comment: once monthly   OB History    Gravida Para Term Preterm AB TAB SAB Ectopic Multiple Living   1 1 1       1      Review of Systems  Skin: Positive for wound.  All other systems reviewed and are negative.     Allergies  Review of patient's allergies indicates no known allergies.  Home Medications   Prior to Admission medications   Medication Sig Start Date End Date Taking? Authorizing Provider  ALPRAZolam Duanne Moron) 1 MG tablet Take 1 tablet (1 mg total) by mouth 2 (two) times daily. 10/03/14  Yes Florian Buff, MD  loratadine-pseudoephedrine (CLARITIN-D 24 HOUR) 10-240 MG per 24 hr tablet Take 1 tablet by mouth daily. 09/09/14  Yes Florian Buff, MD  megestrol (MEGACE) 40 MG tablet 3 a day for 5 days, 2 a day for 5 days, then 1 a day Patient taking differently: Take 40 mg by mouth daily.  05/20/14  Yes Florian Buff, MD  metFORMIN (GLUCOPHAGE) 500 MG tablet Take  1 tablet (500 mg total) by mouth 2 (two) times daily with a meal. 10/02/14  Yes Leota Jacobsen, MD  oxyCODONE-acetaminophen (PERCOCET) 7.5-325 MG per tablet Take 1-2 tablets by mouth every 6 (six) hours as needed for pain. 10/01/14  Yes Florian Buff, MD  venlafaxine XR (EFFEXOR-XR) 150 MG 24 hr capsule Take 2 a day Patient taking differently: Take 300 mg by mouth daily with breakfast. Take 2 a day 05/20/14  Yes Florian Buff, MD  cephALEXin (KEFLEX) 500 MG capsule Take 1 capsule (500 mg total) by mouth 3 (three) times daily. Patient not taking: Reported on 10/05/2014 10/01/14   Florian Buff, MD  fluticasone [facility]) 50 MCG/ACT nasal spray Place 2 sprays into both nostrils daily as needed for allergies.  08/22/14   Historical Provider, MD  levonorgestrel (MIRENA) 20 MCG/24HR IUD 1 each by Intrauterine route  once.    Historical Provider, MD  silver sulfADIAZINE (SILVADENE) 1 % cream Use to area 2-3 times per day Patient taking differently: Apply 1 application topically 2 (two) times daily. Use to AFFECTED BREAST area 2-3 times per day 09/09/14   Florian Buff, MD   BP 109/73 mmHg  Pulse 125  Temp(Src) 98.8 F (37.1 C) (Oral)  Resp 16  Ht 5\' 7"  (1.702 m)  Wt 380 lb (172.367 kg)  BMI 59.50 kg/m2  SpO2 100% Physical Exam  Constitutional: She is oriented to person, place, and time. She appears well-developed and well-nourished. No distress.  Obese. NAD.  HENT:  Head: Normocephalic and atraumatic.  Eyes: Conjunctivae and EOM are normal. Pupils are equal, round, and reactive to light.  Neck: Normal range of motion. Neck supple.  Cardiovascular: Normal rate, regular rhythm and normal heart sounds.   Pulmonary/Chest: Effort normal and breath sounds normal.  Abdominal: Soft. Bowel sounds are normal.  Musculoskeletal: Normal range of motion.  Neurological: She is alert and oriented to person, place, and time.  Skin: Skin is warm and dry.  On left breast, superior to the nipple, there is an area of induration about 5-6 cm in diameter.  Psychiatric: She has a normal mood and affect. Her behavior is normal.  Nursing note and vitals reviewed.   ED Course  Procedures (including critical care time)  DIAGNOSTIC STUDIES: Oxygen Saturation is 99% on room air, normal by my interpretation.    COORDINATION OF CARE: 1:14 PM - Discussed treatment plan with pt at bedside which includes labs and admission and pt agreed to plan.   Labs Review Labs Reviewed  BASIC METABOLIC PANEL - Abnormal; Notable for the following:    Potassium 3.0 (*)    Glucose, Bld 162 (*)    BUN 5 (*)    All other components within normal limits  CBC WITH DIFFERENTIAL - Abnormal; Notable for the following:    WBC 13.0 (*)    RBC 3.74 (*)    Hemoglobin 10.6 (*)    HCT 31.7 (*)    Platelets 149 (*)    Neutro Abs 9.7 (*)     Monocytes Absolute 1.2 (*)    All other components within normal limits  URINALYSIS, ROUTINE W REFLEX MICROSCOPIC - Abnormal; Notable for the following:    Glucose, UA 100 (*)    Hgb urine dipstick MODERATE (*)    Protein, ur 100 (*)    All other components within normal limits  URINE MICROSCOPIC-ADD ON - Abnormal; Notable for the following:    Squamous Epithelial / LPF MANY (*)  All other components within normal limits  CBG MONITORING, ED - Abnormal; Notable for the following:    Glucose-Capillary 161 (*)    All other components within normal limits  CBG MONITORING, ED    Imaging Review No results found.   EKG Interpretation None      MDM   Final diagnoses:  Left breast abscess  Type 2 diabetes mellitus without complication   Patient is morbidly obese with a left breast abscess and new onset diabetes. IV vancomycin. Discussed with Dr. Roderic Palau.  Admit to general medicine.  I personally performed the services described in this documentation, which was scribed in my presence. The recorded information has been reviewed and is accurate.         Nat Christen, MD 10/05/14 [id number]

## 2014-10-05 NOTE — H&P (Signed)
[facility] History and Physical  Judy Nelson LFY:[id number] DOB: 11-16-1985 DOA: 10/05/2014  Referring physician: Dr. Lacinda Axon, ER physician PCP: Gar Ponto, MD   Chief Complaint: breast pain  HPI: Judy Nelson is a 28 y.o. female who's been having issues with her left breast for several weeks to several months now. She reports noticing discharge from her left breast approximately 3-4 months ago. Discharge would increase when she would palpate her breast. She has seen several providers in the interim. She was given a course of antibiotics by her primary care physician which resulted in cessation of discharge. Subsequently, she noted that her breast began to become more inflamed, edematous and painful. She reports feeling feverish and having chills, but not check her temperature at home. She underwent ultrasound of the left breast with aspiration of 16 mL of purulent fluid. She was referred to general surgery for further evaluation. Plans were to have a further incision and drainage, but she was found to have significant hyperglycemia with blood sugar greater than 500. The patient was initially treated in the emergency room with intravenous insulin and IV fluids. With treatment her blood sugars improved, and she was discharged home for outpatient follow-up with general surgery. Since her last ER visit, She felt that her condition has not improved and her breast has continued to become more tender and edematous. She came back to the emergency room for evaluation and will be admitted for further treatments.   Review of Systems:  Pertinent positives as per HPI, otherwise negative  Past Medical History  Diagnosis Date  . Ovarian cyst   . Obesity   . Anxiety   . Depression   . Anemia   . Hypertension   . Complication of anesthesia Patient stated she becomes rowdy   Past Surgical History  Procedure Laterality Date  . Laparoscopic ovarian cystectomy Right 02/05/2014    Procedure:  LAPAROSCOPIC OVARIAN CYSTECTOMY;  Surgeon: Florian Buff, MD;  Location: [facility];  Service: Gynecology;  Laterality: Right;   Social History:  reports that she has been smoking Cigarettes.  She has a 2 pack-year smoking history. She has never used smokeless tobacco. She reports that she drinks alcohol. She reports that she does not use illicit drugs.  No Known Allergies  Family History  Problem Relation Age of Onset  . Diabetes Father   . Hypertension Father   . Heart disease Daughter     heart murmur  . Cancer Maternal Grandmother     ovarian cancer  . Obesity Paternal Grandmother   . Diabetes Paternal Grandfather      Prior to Admission medications   Medication Sig Start Date End Date Taking? Authorizing Provider  ALPRAZolam Duanne Moron) 1 MG tablet Take 1 tablet (1 mg total) by mouth 2 (two) times daily. 10/03/14  Yes Florian Buff, MD  loratadine-pseudoephedrine (CLARITIN-D 24 HOUR) 10-240 MG per 24 hr tablet Take 1 tablet by mouth daily. 09/09/14  Yes Florian Buff, MD  megestrol (MEGACE) 40 MG tablet 3 a day for 5 days, 2 a day for 5 days, then 1 a day Patient taking differently: Take 40 mg by mouth daily.  05/20/14  Yes Florian Buff, MD  metFORMIN (GLUCOPHAGE) 500 MG tablet Take 1 tablet (500 mg total) by mouth 2 (two) times daily with a meal. 10/02/14  Yes Leota Jacobsen, MD  oxyCODONE-acetaminophen (PERCOCET) 7.5-325 MG per tablet Take 1-2 tablets by mouth every 6 (six) hours as needed for pain. 10/01/14  Yes Toula Moos  Chriss Driver, MD  venlafaxine XR (EFFEXOR-XR) 150 MG 24 hr capsule Take 2 a day Patient taking differently: Take 300 mg by mouth daily with breakfast. Take 2 a day 05/20/14  Yes Florian Buff, MD  cephALEXin (KEFLEX) 500 MG capsule Take 1 capsule (500 mg total) by mouth 3 (three) times daily. Patient not taking: Reported on 10/05/2014 10/01/14   Florian Buff, MD  fluticasone [facility]) 50 MCG/ACT nasal spray Place 2 sprays into both nostrils daily as needed for allergies.   08/22/14   Historical Provider, MD  levonorgestrel (MIRENA) 20 MCG/24HR IUD 1 each by Intrauterine route once.    Historical Provider, MD  silver sulfADIAZINE (SILVADENE) 1 % cream Use to area 2-3 times per day Patient taking differently: Apply 1 application topically 2 (two) times daily. Use to AFFECTED BREAST area 2-3 times per day 09/09/14   Florian Buff, MD   Physical Exam: Filed Vitals:   10/05/14 1204 10/05/14 1435 10/05/14 1634  BP: 145/83 109/73 132/86  Pulse: 125 125 124  Temp: 98.8 F (37.1 C)  99.3 F (37.4 C)  TempSrc: Oral  Oral  Resp:  16   Height: 5\' 7"  (1.702 m)  5\' 7"  (1.702 m)  Weight: 172.367 kg (380 lb)  176 kg (388 lb 0.2 oz)  SpO2: 99% 100% 98%    Wt Readings from Last 3 Encounters:  10/05/14 176 kg (388 lb 0.2 oz)  10/02/14 172.367 kg (380 lb)  10/02/14 172.367 kg (380 lb)    General:  Appears calm and comfortable Eyes: PERRL, normal lids, irises & conjunctiva ENT: grossly normal hearing, lips & tongue Neck: no LAD, masses or thyromegaly Cardiovascular: s1, s2, rrr, no m/r/g. No LE edema. Respiratory: CTA bilaterally, no w/r/r. Normal respiratory effort. Abdomen: soft, ntnd Skin: no rash or induration seen on limited exam Musculoskeletal: grossly normal tone BUE/BLE Psychiatric: grossly normal mood and affect, speech fluent and appropriate Neurologic: grossly non-focal.          Labs on Admission:  Basic Metabolic Panel:  Recent Labs Lab 10/02/14 1140 10/02/14 1608 10/05/14 1225  NA 131* 131* 137  K 3.6* 3.9 3.0*  CL 95* 94* 97  CO2 21 24 26   GLUCOSE 534* 445* 162*  BUN 5* 5* 5*  CREATININE 0.75 0.74 0.72  CALCIUM 8.9 9.2 9.2   Liver Function Tests: No results for input(s): AST, ALT, ALKPHOS, BILITOT, PROT, ALBUMIN in the last 168 hours. No results for input(s): LIPASE, AMYLASE in the last 168 hours. No results for input(s): AMMONIA in the last 168 hours. CBC:  Recent Labs Lab 10/02/14 1140 10/02/14 1608 10/05/14 1225  WBC 8.6  8.8 13.0*  NEUTROABS 6.5 6.5 9.7*  HGB 12.2 12.1 10.6*  HCT 36.7 36.1 31.7*  MCV 84.4 84.3 84.8  PLT 132* 132* 149*   Cardiac Enzymes: No results for input(s): CKTOTAL, CKMB, CKMBINDEX, TROPONINI in the last 168 hours.  BNP (last 3 results) No results for input(s): PROBNP in the last 8760 hours. CBG:  Recent Labs Lab 10/02/14 1539 10/02/14 1709 10/02/14 1829 10/05/14 1212 10/05/14 1632  GLUCAP 427* 406* 324* 161* 183*    Radiological Exams on Admission: No results found.  Assessment/Plan Principal Problem:   Left breast abscess Active Problems:   Morbid obesity with BMI of 50.0-59.9, adult   Type 2 diabetes mellitus without complication   Hypokalemia   Abscess of breast   1. Left breast cellulitis/abscess. No purulent drainage noted at this time. We'll start the patient on  intravenous antibiotics. General surgery will be consulted. Will defer any further imaging to general surgery. She'll be kept nothing by mouth after midnight. 2. Type 2 diabetes, new diagnosis. Her blood sugars appear to be reasonably controlled on metformin. Will start the patient on sliding scale insulin during her hospital stay. Her blood sugars are likely for the elevated due to #1. Check hemoglobin A1c. She may be able to discharge home on oral agents for now. Will request diabetes coordinator. 3. Hypokalemia. Replace 4. Tachycardia. Likely reactive to underlying infectious process/mild dehydration. We'll continue with IV fluids. 5. Morbid obesity. Counseling on the importance of diet and exercise.  Code Status: full code DVT Prophylaxis: heparin sq Family Communication: discussed with family members at the bedside Disposition Plan: discharge home once improved  Time spent: 34mins  Judy Nelson [facility] Pager [phone]

## 2014-10-05 NOTE — Progress Notes (Signed)
Pharmacy Note:  Initial antibiotics for Vancomycin ordered by EDP for Wound Infection.  Estimated Creatinine Clearance: 175 mL/min (by C-G formula based on Cr of 0.72).   No Known Allergies  Filed Vitals:   10/05/14 1204  BP: 145/83  Pulse: 125  Temp: 98.8 F (37.1 C)    Anti-infectives    Start     Dose/Rate Route Frequency Ordered Stop   10/05/14 1530  vancomycin (VANCOCIN) IVPB 1000 mg/200 mL premix     1,000 mg200 mL/hr over 60 Minutes Intravenous  Once 10/05/14 1420     10/05/14 1430  vancomycin (VANCOCIN) IVPB 1000 mg/200 mL premix     1,000 mg200 mL/hr over 60 Minutes Intravenous  Once 10/05/14 1420        Plan: Initial dose of Vancomycin 2gm IV X 1 ordered. F/U admission orders for further dosing if therapy continued.  Pricilla Larsson, [facility] 10/05/2014 2:21 PM

## 2014-10-05 NOTE — ED Notes (Signed)
Pt reports elevated blood sugar, no hx of diabetes. Pt was scheduled for surgery for a L breast infection on Tues. Pre-op bloodwork CBG was >500. Pt told to come to ER.

## 2014-10-06 ENCOUNTER — Encounter (HOSPITAL_COMMUNITY): Payer: Medicaid Other

## 2014-10-06 LAB — GLUCOSE, CAPILLARY
GLUCOSE-CAPILLARY: 155 mg/dL — AB (ref 70–99)
GLUCOSE-CAPILLARY: 157 mg/dL — AB (ref 70–99)
Glucose-Capillary: 136 mg/dL — ABNORMAL HIGH (ref 70–99)
Glucose-Capillary: 148 mg/dL — ABNORMAL HIGH (ref 70–99)

## 2014-10-06 LAB — BASIC METABOLIC PANEL
Anion gap: 13 (ref 5–15)
BUN: 3 mg/dL — ABNORMAL LOW (ref 6–23)
CO2: 27 meq/L (ref 19–32)
Calcium: 8.9 mg/dL (ref 8.4–10.5)
Chloride: 97 mEq/L (ref 96–112)
Creatinine, Ser: 0.85 mg/dL (ref 0.50–1.10)
GFR calc non Af Amer: >90 mL/min (ref >90)
GLUCOSE: 151 mg/dL — AB (ref 70–99)
POTASSIUM: 3.2 meq/L — AB (ref 3.7–5.3)
Sodium: 137 mEq/L (ref 137–147)

## 2014-10-06 LAB — CBC
HCT: 30.4 % — ABNORMAL LOW (ref 36.0–46.0)
Hemoglobin: 10.1 g/dL — ABNORMAL LOW (ref 12.0–15.0)
MCH: 28.4 pg (ref 26.0–34.0)
MCHC: 33.2 g/dL (ref 30.0–36.0)
MCV: 85.4 fL (ref 78.0–100.0)
Platelets: 148 10*3/uL — ABNORMAL LOW (ref 150–400)
RBC: 3.56 MIL/uL — AB (ref 3.87–5.11)
RDW: 13.9 % (ref 11.5–15.5)
WBC: 13.7 10*3/uL — ABNORMAL HIGH (ref 4.0–10.5)

## 2014-10-06 LAB — POTASSIUM: Potassium: 3.6 mEq/L — ABNORMAL LOW (ref 3.7–5.3)

## 2014-10-06 LAB — HIV ANTIBODY (ROUTINE TESTING W REFLEX): HIV 1&2 Ab, 4th Generation: NONREACTIVE

## 2014-10-06 LAB — TSH: TSH: 1.25 u[IU]/mL (ref 0.350–4.500)

## 2014-10-06 LAB — HEMOGLOBIN A1C
HEMOGLOBIN A1C: 7.9 % — AB (ref <5.7)
Mean Plasma Glucose: 180 mg/dL — ABNORMAL HIGH (ref <117)

## 2014-10-06 MED ORDER — POTASSIUM CHLORIDE 10 MEQ/100ML IV SOLN
10.0000 meq | INTRAVENOUS | Status: AC
  Administered 2014-10-06 (×3): 10 meq via INTRAVENOUS
  Filled 2014-10-06: qty 100

## 2014-10-06 MED ORDER — DIPHENHYDRAMINE HCL 25 MG PO CAPS
25.0000 mg | ORAL_CAPSULE | Freq: Four times a day (QID) | ORAL | Status: DC | PRN
  Administered 2014-10-06 – 2014-10-08 (×7): 25 mg via ORAL
  Filled 2014-10-06 (×7): qty 1

## 2014-10-06 MED ORDER — SODIUM CHLORIDE 0.9 % IJ SOLN: 10.0000 mL | INTRAMUSCULAR | Status: DC | PRN

## 2014-10-06 MED ORDER — POTASSIUM CHLORIDE 10 MEQ/100ML IV SOLN
10.0000 meq | INTRAVENOUS | Status: AC
  Administered 2014-10-06 (×4): 10 meq via INTRAVENOUS
  Filled 2014-10-06 (×4): qty 100

## 2014-10-06 MED ORDER — CHLORHEXIDINE GLUCONATE 4 % EX LIQD
1.0000 "application " | Freq: Once | CUTANEOUS | Status: AC
  Administered 2014-10-06: 1 via TOPICAL
  Filled 2014-10-06: qty 15

## 2014-10-06 MED ORDER — SODIUM CHLORIDE 0.9 % IJ SOLN: 10.0000 mL | Freq: Two times a day (BID) | INTRAMUSCULAR | Status: DC

## 2014-10-06 NOTE — Consult Note (Signed)
Patient well known to me from recently being seen as an outpatient. Has left breast abscess as well as hyperglycemia which is new onset. Was admitted yesterday to the hospital for control of her diabetes. Does have hypokalemia. Has lost IV access. Have ordered a PICC line. Will order potassium runs once PICC line is inserted. This will delay surgery until tomorrow. The risks and benefits of the incision and drainage of left breast abscess were fully explained to the patient, who gave informed consent.

## 2014-10-06 NOTE — Progress Notes (Signed)
[facility] PROGRESS NOTE  [name]:[id number] DOB: [birth year] DOA: 10/05/2014 PCP: Gar Ponto, MD  Assessment/Plan: 1. Left breast cellulitis/abscess. Continue on broad-spectrum intravenous antibiotics. Discuss with Dr. Arnoldo Morale and plans are for surgical management tomorrow. 2. Type 2 diabetes, new diagnosis. Blood sugars are reasonably controlled on metformin. Patient is currently on sliding scale. Hemoglobin A1c 7.9. Likely discharge home on metformin with outpatient follow-up 3. Hypokalemia. Replace 4. Tachycardia. Likely reactive due to underlying process/pain/mild dehydration. Continue with IV fluids 5. Morbid obesity  Code Status: Full code Family Communication: Discussed with patient Disposition Plan: Discharge home once improved   Consultants:  General surgery  Procedures:  Antibiotics:  Vancomycin 12/20>>  Zosyn 12/20>>  HPI/Subjective: Continued pain in left breast. Unchanged from yesterday. No other complaints  Objective: Filed Vitals:   10/06/14 1601  BP: 126/79  Pulse: 117  Temp: 99.3 F (37.4 C)  Resp: 20    Intake/Output Summary (Last 24 hours) at 10/06/14 1949 Last data filed at 10/06/14 1825  Gross per 24 hour  Intake 2305.83 ml  Output      0 ml  Net 2305.83 ml   Filed Weights   10/05/14 1204 10/05/14 1634  Weight: 172.367 kg (380 lb) 176 kg (388 lb 0.2 oz)    Exam:   General:  No acute distress   Cardiovascular: S1, S2, mildly tachycardic  Respiratory: Clear to auscultation bilaterally  Abdomen: Soft, nontender, positive bowel sounds  Musculoskeletal: No peripheral edema bilaterally. Left breast is edematous, tender to palpation and erythematous.   Data Reviewed: Basic Metabolic Panel:  Recent Labs Lab 10/02/14 1140 10/02/14 1608 10/05/14 1225 10/05/14 1945 10/06/14 0532 10/06/14 1753  NA 131* 131* 137  --  137  --   K 3.6* 3.9 3.0*  --  3.2* 3.6*  CL 95* 94* 97  --  97  --   CO2 21 24 26   --  27   --   GLUCOSE 534* 445* 162*  --  151*  --   BUN 5* 5* 5*  --  3*  --   CREATININE 0.75 0.74 0.72 0.67 0.85  --   CALCIUM 8.9 9.2 9.2  --  8.9  --    Liver Function Tests: No results for input(s): AST, ALT, ALKPHOS, BILITOT, PROT, ALBUMIN in the last 168 hours. No results for input(s): LIPASE, AMYLASE in the last 168 hours. No results for input(s): AMMONIA in the last 168 hours. CBC:  Recent Labs Lab 10/02/14 1140 10/02/14 1608 10/05/14 1225 10/05/14 1945 10/06/14 0532  WBC 8.6 8.8 13.0* 15.1* 13.7*  NEUTROABS 6.5 6.5 9.7*  --   --   HGB 12.2 12.1 10.6* 10.0* 10.1*  HCT 36.7 36.1 31.7* 29.6* 30.4*  MCV 84.4 84.3 84.8 84.1 85.4  PLT 132* 132* 149* 149* 148*   Cardiac Enzymes: No results for input(s): CKTOTAL, CKMB, CKMBINDEX, TROPONINI in the last 168 hours. BNP (last 3 results) No results for input(s): PROBNP in the last 8760 hours. CBG:  Recent Labs Lab 10/05/14 1632 10/05/14 2130 10/06/14 0803 10/06/14 1228 10/06/14 1636  GLUCAP 183* 183* 155* 136* 148*    Recent Results (from the past 240 hour(s))  Culture, routine-abscess     Status: None   Collection Time: 09/30/14 10:20 AM  Result Value Ref Range Status   Specimen Description ABSCESS ASPIRATE LEFT BREAST  Final   Special Requests DOXYCYCLINE q/4days  Final   Gram Stain   Final    ABUNDANT WBC PRESENT,BOTH PMN AND MONONUCLEAR NO  SQUAMOUS EPITHELIAL CELLS SEEN FEW GRAM POSITIVE COCCI IN PAIRS IN CLUSTERS Performed at [facility]    Culture   Final    NO GROWTH 3 DAYS Performed at [facility]    Report Status 10/03/2014 FINAL  Final     Studies: No results found.  Scheduled Meds: . ALPRAZolam  1 mg Oral BID  . chlorhexidine  1 application Topical Once  . heparin  5,000 Units Subcutaneous 3 times per day  . insulin aspart  0-15 Units Subcutaneous TID WC  . insulin aspart  0-5 Units Subcutaneous QHS  . megestrol  40 mg Oral Daily  . piperacillin-tazobactam (ZOSYN)  IV  3.375 g  Intravenous Q8H  . potassium chloride  10 mEq Intravenous Q1 Hr x 3  . sodium chloride  10-40 mL Intracatheter Q12H  . venlafaxine XR  300 mg Oral Q breakfast   Continuous Infusions: . 0.9 % NaCl with KCl 20 mEq / L 125 mL/hr at 10/06/14 1056    Principal Problem:   Left breast abscess Active Problems:   Morbid obesity with BMI of 50.0-59.9, adult   Type 2 diabetes mellitus without complication   Hypokalemia   Abscess of breast    Time spent: 3mins    Judy Nelson,Judy Nelson  [facility] Pager [phone]. If 7PM-7AM, please contact night-coverage at [email or web address], password [facility] 10/06/2014, 7:49 PM  LOS: 1 day

## 2014-10-06 NOTE — Progress Notes (Signed)
Peripherally Inserted Central Catheter/Midline Placement  The IV Nurse has discussed with the patient and/or persons authorized to consent for the patient, the purpose of this procedure and the potential benefits and risks involved with this procedure.  The benefits include less needle sticks, lab draws from the catheter and patient may be discharged home with the catheter.  Risks include, but not limited to, infection, bleeding, blood clot (thrombus formation), and puncture of an artery; nerve damage and irregular heat beat.  Alternatives to this procedure were also discussed.  PICC/Midline Placement Documentation  PICC / Midline Single Lumen 35/32/99 PICC Right Basilic 49 cm 0 cm (Active)  Indication for Insertion or Continuance of Line Administration of hyperosmolar/irritating solutions (i.e. TPN, Vancomycin, etc.);Home intravenous therapies (PICC only) 10/06/2014 10:12 AM  Exposed Catheter (cm) 0 cm 10/06/2014 10:12 AM  Site Assessment Clean;Dry;Intact 10/06/2014 10:12 AM  Line Status Flushed;Saline locked;Blood return noted;Capped (central line) 10/06/2014 10:12 AM  Dressing Type Transparent;Securing device 10/06/2014 10:12 AM  Dressing Status Clean;Dry;Intact;Antimicrobial disc in place 10/06/2014 10:12 AM  Line Care Connections checked and tightened 10/06/2014 10:12 AM  Dressing Intervention New dressing 10/06/2014 10:12 AM  Dressing Change Due 10/13/14 10/06/2014 10:12 AM       Petra Kuba D 10/06/2014, 10:13 AM

## 2014-10-07 ENCOUNTER — Inpatient Hospital Stay (HOSPITAL_COMMUNITY): Payer: Medicaid Other | Admitting: Anesthesiology

## 2014-10-07 ENCOUNTER — Encounter (HOSPITAL_COMMUNITY): Payer: Self-pay | Admitting: Anesthesiology

## 2014-10-07 ENCOUNTER — Encounter (HOSPITAL_COMMUNITY)
Admission: EM | Disposition: A | Discharge status: Home / Self Care | Payer: Self-pay | Source: Home / Self Care | Attending: Internal Medicine

## 2014-10-07 HISTORY — PX: INCISION AND DRAINAGE ABSCESS: SHX5864

## 2014-10-07 LAB — BASIC METABOLIC PANEL
Anion gap: 7 (ref 5–15)
CALCIUM: 8.3 mg/dL — AB (ref 8.4–10.5)
CO2: 27 mmol/L (ref 19–32)
Chloride: 104 mEq/L (ref 96–112)
Creatinine, Ser: 0.66 mg/dL (ref 0.50–1.10)
GFR calc Af Amer: >90 mL/min (ref >90)
Glucose, Bld: 154 mg/dL — ABNORMAL HIGH (ref 70–99)
Potassium: 3.6 mmol/L (ref 3.5–5.1)
SODIUM: 138 mmol/L (ref 135–145)

## 2014-10-07 LAB — GLUCOSE, CAPILLARY
GLUCOSE-CAPILLARY: 126 mg/dL — AB (ref 70–99)
Glucose-Capillary: 135 mg/dL — ABNORMAL HIGH (ref 70–99)
Glucose-Capillary: 115 mg/dL — ABNORMAL HIGH (ref 70–99)
Glucose-Capillary: 173 mg/dL — ABNORMAL HIGH (ref 70–99)
Glucose-Capillary: 130 mg/dL — ABNORMAL HIGH (ref 70–99)

## 2014-10-07 LAB — CBC
HEMATOCRIT: 29 % — AB (ref 36.0–46.0)
HEMOGLOBIN: 9.4 g/dL — AB (ref 12.0–15.0)
MCH: 27.7 pg (ref 26.0–34.0)
MCHC: 32.4 g/dL (ref 30.0–36.0)
MCV: 85.5 fL (ref 78.0–100.0)
Platelets: 160 10*3/uL (ref 150–400)
RBC: 3.39 MIL/uL — ABNORMAL LOW (ref 3.87–5.11)
RDW: 14.1 % (ref 11.5–15.5)
WBC: 16.4 10*3/uL — ABNORMAL HIGH (ref 4.0–10.5)

## 2014-10-07 LAB — SURGICAL PCR SCREEN
MRSA, PCR: NEGATIVE
STAPHYLOCOCCUS AUREUS: NEGATIVE

## 2014-10-07 SURGERY — INCISION AND DRAINAGE, ABSCESS
Anesthesia: General | Laterality: Left

## 2014-10-07 MED ORDER — FENTANYL CITRATE 0.05 MG/ML IJ SOLN: 25.0000 ug | INTRAMUSCULAR | Status: DC | PRN

## 2014-10-07 MED ORDER — MIDAZOLAM HCL 2 MG/2ML IJ SOLN
INTRAMUSCULAR | Status: AC
  Filled 2014-10-07: qty 2

## 2014-10-07 MED ORDER — KETOROLAC TROMETHAMINE 30 MG/ML IJ SOLN
INTRAMUSCULAR | Status: AC
  Filled 2014-10-07: qty 1

## 2014-10-07 MED ORDER — ROCURONIUM BROMIDE 100 MG/10ML IV SOLN
INTRAVENOUS | Status: DC | PRN
  Administered 2014-10-07: 25 mg via INTRAVENOUS

## 2014-10-07 MED ORDER — FENTANYL CITRATE 0.05 MG/ML IJ SOLN
25.0000 ug | INTRAMUSCULAR | Status: AC
  Administered 2014-10-07 (×2): 25 ug via INTRAVENOUS

## 2014-10-07 MED ORDER — LACTATED RINGERS IV SOLN
INTRAVENOUS | Status: DC
  Administered 2014-10-07: 1000 mL via INTRAVENOUS

## 2014-10-07 MED ORDER — KETOROLAC TROMETHAMINE 30 MG/ML IJ SOLN
30.0000 mg | Freq: Once | INTRAMUSCULAR | Status: AC
  Administered 2014-10-07: 30 mg via INTRAVENOUS

## 2014-10-07 MED ORDER — SUCCINYLCHOLINE CHLORIDE 20 MG/ML IJ SOLN
INTRAMUSCULAR | Status: DC | PRN
  Administered 2014-10-07: 150 mg via INTRAVENOUS

## 2014-10-07 MED ORDER — HEPARIN SODIUM (PORCINE) 5000 UNIT/ML IJ SOLN: 5000.0000 [IU] | Freq: Three times a day (TID) | INTRAMUSCULAR | Status: DC

## 2014-10-07 MED ORDER — LIDOCAINE HCL (CARDIAC) 20 MG/ML IV SOLN
INTRAVENOUS | Status: DC | PRN
  Administered 2014-10-07: 35 mg via INTRAVENOUS

## 2014-10-07 MED ORDER — MIDAZOLAM HCL 5 MG/5ML IJ SOLN
INTRAMUSCULAR | Status: DC | PRN
  Administered 2014-10-07: 2 mg via INTRAVENOUS

## 2014-10-07 MED ORDER — PROPOFOL 10 MG/ML IV BOLUS
INTRAVENOUS | Status: AC
  Filled 2014-10-07: qty 20

## 2014-10-07 MED ORDER — LACTATED RINGERS IV SOLN
INTRAVENOUS | Status: DC | PRN
Start: 2014-10-07 — End: 2014-10-07

## 2014-10-07 MED ORDER — ONDANSETRON HCL 4 MG/2ML IJ SOLN
4.0000 mg | Freq: Once | INTRAMUSCULAR | Status: AC
  Administered 2014-10-07: 4 mg via INTRAVENOUS

## 2014-10-07 MED ORDER — ONDANSETRON HCL 4 MG/2ML IJ SOLN
INTRAMUSCULAR | Status: AC
  Filled 2014-10-07: qty 2

## 2014-10-07 MED ORDER — ROCURONIUM BROMIDE 50 MG/5ML IV SOLN
INTRAVENOUS | Status: AC
  Filled 2014-10-07: qty 1

## 2014-10-07 MED ORDER — MIDAZOLAM HCL 2 MG/2ML IJ SOLN
1.0000 mg | INTRAMUSCULAR | Status: DC | PRN
  Administered 2014-10-07 (×2): 2 mg via INTRAVENOUS
  Filled 2014-10-07: qty 2

## 2014-10-07 MED ORDER — SODIUM CHLORIDE 0.9 % IR SOLN
Status: DC | PRN
  Administered 2014-10-07: 1000 mL

## 2014-10-07 MED ORDER — FENTANYL CITRATE 0.05 MG/ML IJ SOLN
INTRAMUSCULAR | Status: AC
  Filled 2014-10-07: qty 5

## 2014-10-07 MED ORDER — GLYCOPYRROLATE 0.2 MG/ML IJ SOLN
0.2000 mg | Freq: Once | INTRAMUSCULAR | Status: AC
  Administered 2014-10-07: 0.2 mg via INTRAVENOUS

## 2014-10-07 MED ORDER — NEOSTIGMINE METHYLSULFATE 10 MG/10ML IV SOLN
INTRAVENOUS | Status: DC | PRN
  Administered 2014-10-07: 2 mg via INTRAVENOUS

## 2014-10-07 MED ORDER — FENTANYL CITRATE 0.05 MG/ML IJ SOLN
INTRAMUSCULAR | Status: AC
Start: 2014-10-07 — End: 2014-10-07
  Filled 2014-10-07: qty 2

## 2014-10-07 MED ORDER — SODIUM CHLORIDE 0.9 % IR SOLN
Status: DC | PRN
  Administered 2014-10-07: 3000 mL

## 2014-10-07 MED ORDER — ONDANSETRON HCL 4 MG/2ML IJ SOLN: 4.0000 mg | Freq: Once | INTRAMUSCULAR | Status: DC | PRN

## 2014-10-07 MED ORDER — FENTANYL CITRATE 0.05 MG/ML IJ SOLN
INTRAMUSCULAR | Status: DC | PRN
  Administered 2014-10-07: 100 ug via INTRAVENOUS
  Administered 2014-10-07: 50 ug via INTRAVENOUS
  Administered 2014-10-07: 100 ug via INTRAVENOUS

## 2014-10-07 MED ORDER — PROPOFOL 10 MG/ML IV BOLUS
INTRAVENOUS | Status: DC | PRN
  Administered 2014-10-07: 160 mg via INTRAVENOUS

## 2014-10-07 MED ORDER — GLYCOPYRROLATE 0.2 MG/ML IJ SOLN
INTRAMUSCULAR | Status: AC
  Filled 2014-10-07: qty 1

## 2014-10-07 MED ORDER — LACTATED RINGERS IV SOLN
INTRAVENOUS | Status: DC | PRN
  Administered 2014-10-07 (×2): via INTRAVENOUS

## 2014-10-07 MED ORDER — VANCOMYCIN HCL IN DEXTROSE 1-5 GM/200ML-% IV SOLN
1000.0000 mg | Freq: Four times a day (QID) | INTRAVENOUS | Status: DC
  Administered 2014-10-07 – 2014-10-08 (×4): 1000 mg via INTRAVENOUS
  Filled 2014-10-07 (×25): qty 200

## 2014-10-07 SURGICAL SUPPLY — 29 items
BAG HAMPER (MISCELLANEOUS) ×2 IMPLANT
CLOTH BEACON ORANGE TIMEOUT ST (SAFETY) ×2 IMPLANT
COVER LIGHT HANDLE STERIS (MISCELLANEOUS) ×4 IMPLANT
ELECT REM PT RETURN 9FT ADLT (ELECTROSURGICAL) ×2
ELECTRODE REM PT RTRN 9FT ADLT (ELECTROSURGICAL) ×1 IMPLANT
GAUZE PACKING IODOFORM 2 (PACKING) ×2 IMPLANT
GAUZE SPONGE 4X4 12PLY STRL (GAUZE/BANDAGES/DRESSINGS) ×2 IMPLANT
GLOVE BIOGEL M STRL SZ7.5 (GLOVE) ×2 IMPLANT
GLOVE BIOGEL PI IND STRL 7.0 (GLOVE) ×1 IMPLANT
GLOVE BIOGEL PI IND STRL 7.5 (GLOVE) ×2 IMPLANT
GLOVE BIOGEL PI INDICATOR 7.0 (GLOVE) ×1
GLOVE BIOGEL PI INDICATOR 7.5 (GLOVE) ×2
GLOVE ECLIPSE 7.0 STRL STRAW (GLOVE) ×2 IMPLANT
GLOVE SURG SS PI 7.5 STRL IVOR (GLOVE) ×6 IMPLANT
GOWN STRL REUS W/ TWL LRG LVL3 (GOWN DISPOSABLE) ×3 IMPLANT
GOWN STRL REUS W/TWL LRG LVL3 (GOWN DISPOSABLE) ×5 IMPLANT
HANDPIECE INTERPULSE COAX TIP (DISPOSABLE) ×1
IV NS IRRIG 3000ML ARTHROMATIC (IV SOLUTION) ×2 IMPLANT
KIT ROOM TURNOVER APOR (KITS) ×2 IMPLANT
MANIFOLD NEPTUNE II (INSTRUMENTS) ×2 IMPLANT
MARKER SKIN DUAL TIP RULER LAB (MISCELLANEOUS) ×2 IMPLANT
NS IRRIG 1000ML POUR BTL (IV SOLUTION) ×2 IMPLANT
PACK MINOR (CUSTOM PROCEDURE TRAY) ×2 IMPLANT
PAD ARMBOARD 7.5X6 YLW CONV (MISCELLANEOUS) ×2 IMPLANT
SET BASIN LINEN APH (SET/KITS/TRAYS/PACK) ×2 IMPLANT
SET HNDPC FAN SPRY TIP SCT (DISPOSABLE) ×1 IMPLANT
SPONGE LAP 18X18 X RAY DECT (DISPOSABLE) ×2 IMPLANT
SYR BULB IRRIGATION 50ML (SYRINGE) ×2 IMPLANT
TAPE CLOTH SURG 4X10 WHT LF (GAUZE/BANDAGES/DRESSINGS) ×2 IMPLANT

## 2014-10-07 NOTE — Anesthesia Preprocedure Evaluation (Addendum)
Anesthesia Evaluation  Patient identified by MRN, date of birth, ID band Patient awake    Reviewed: Allergy & Precautions, H&P , NPO status , Patient's Chart, lab work & pertinent test results  History of Anesthesia Complications (+) Emergence Delirium and history of anesthetic complications  Airway Mallampati: II  TM Distance: >3 FB     Dental  (+) Teeth Intact   Pulmonary Current Smoker,  breath sounds clear to auscultation        Cardiovascular hypertension, Rhythm:Regular Rate:Normal     Neuro/Psych PSYCHIATRIC DISORDERS Anxiety Depression    GI/Hepatic   Endo/Other  diabetes (no meds), Type 2Morbid obesity  Renal/GU      Musculoskeletal   Abdominal   Peds  Hematology  (+) anemia ,   Anesthesia Other Findings   Reproductive/Obstetrics                            Anesthesia Physical Anesthesia Plan  ASA: III  Anesthesia Plan: General   Post-op Pain Management:    Induction: Intravenous, Rapid sequence and Cricoid pressure planned  Airway Management Planned: Oral ETT  Additional Equipment:   Intra-op Plan:   Post-operative Plan: Extubation in OR  Informed Consent: I have reviewed the patients History and Physical, chart, labs and discussed the procedure including the risks, benefits and alternatives for the proposed anesthesia with the patient or authorized representative who has indicated his/her understanding and acceptance.     Plan Discussed with:   Anesthesia Plan Comments: (BMI>50)        Anesthesia Quick Evaluation

## 2014-10-07 NOTE — Progress Notes (Signed)
[facility] PROGRESS NOTE  Judy Nelson DOB: May 08, 1986 DOA: 10/05/2014 PCP: Judy Ponto, MD  Assessment/Plan: 1. Left breast cellulitis/abscess. Continue on broad-spectrum intravenous antibiotics. S/p incision and drainage by Judy Nelson.  2. Type 2 diabetes, new diagnosis. Blood sugars are reasonably controlled on metformin. Patient is currently on sliding scale. Hemoglobin A1c 7.9. Likely discharge home on metformin with outpatient follow-up 3. Hypokalemia. Replace 4. Tachycardia. Likely reactive due to underlying process/pain/mild dehydration. Continue with IV fluids 5. Morbid obesity  Code Status: Full code Family Communication: Discussed with patient Disposition Plan: Discharge home once improved   Consultants:  General surgery  Procedures:  Antibiotics:  Vancomycin 12/20>>  Zosyn 12/20>>  HPI/Subjective: Feeling better after surgery. Reports pain is controlled.  Objective: Filed Vitals:   10/07/14 1751  BP: 103/59  Pulse: 113  Temp: 98.7 F (37.1 C)  Resp: 20    Intake/Output Summary (Last 24 hours) at 10/07/14 2023 Last data filed at 10/07/14 1751  Gross per 24 hour  Intake   2790 ml  Output      0 ml  Net   2790 ml   Filed Weights   10/05/14 1204 10/05/14 1634 10/07/14 1111  Weight: 172.367 kg (380 lb) 176 kg (388 lb 0.2 oz) 175.996 kg (388 lb)    Exam:   General:  No acute distress   Cardiovascular: S1, S2, mildly tachycardic  Respiratory: Clear to auscultation bilaterally  Abdomen: Soft, nontender, positive bowel sounds  Musculoskeletal: No peripheral edema bilaterally. Left breast with dressing in place. This was not removed today.  Data Reviewed: Basic Metabolic Panel:  Recent Labs Lab 10/02/14 1140 10/02/14 1608 10/05/14 1225 10/05/14 1945 10/06/14 0532 10/06/14 1753 10/07/14 0500  NA 131* 131* 137  --  137  --  138  K 3.6* 3.9 3.0*  --  3.2* 3.6* 3.6  CL 95* 94* 97  --  97  --  104  CO2 21 24 26    --  27  --  27  GLUCOSE 534* 445* 162*  --  151*  --  154*  BUN 5* 5* 5*  --  3*  --  <5*  CREATININE 0.75 0.74 0.72 0.67 0.85  --  0.66  CALCIUM 8.9 9.2 9.2  --  8.9  --  8.3*   Liver Function Tests: No results for input(s): AST, ALT, ALKPHOS, BILITOT, PROT, ALBUMIN in the last 168 hours. No results for input(s): LIPASE, AMYLASE in the last 168 hours. No results for input(s): AMMONIA in the last 168 hours. CBC:  Recent Labs Lab 10/02/14 1140 10/02/14 1608 10/05/14 1225 10/05/14 1945 10/06/14 0532 10/07/14 0500  WBC 8.6 8.8 13.0* 15.1* 13.7* 16.4*  NEUTROABS 6.5 6.5 9.7*  --   --   --   HGB 12.2 12.1 10.6* 10.0* 10.1* 9.4*  HCT 36.7 36.1 31.7* 29.6* 30.4* 29.0*  MCV 84.4 84.3 84.8 84.1 85.4 85.5  PLT 132* 132* 149* 149* 148* 160   Cardiac Enzymes: No results for input(s): CKTOTAL, CKMB, CKMBINDEX, TROPONINI in the last 168 hours. BNP (last 3 results) No results for input(s): PROBNP in the last 8760 hours. CBG:  Recent Labs Lab 10/06/14 2107 10/07/14 0743 10/07/14 1059 10/07/14 1350 10/07/14 1619  GLUCAP 157* 135* 126* 115* 173*    Recent Results (from the past 240 hour(s))  Culture, routine-abscess     Status: None   Collection Time: 09/30/14 10:20 AM  Result Value Ref Range Status   Specimen Description ABSCESS ASPIRATE LEFT BREAST  Final  Special Requests DOXYCYCLINE q/4days  Final   Gram Stain   Final    ABUNDANT WBC PRESENT,BOTH PMN AND MONONUCLEAR NO SQUAMOUS EPITHELIAL CELLS SEEN FEW GRAM POSITIVE COCCI IN PAIRS IN CLUSTERS Performed at [facility]    Culture   Final    NO GROWTH 3 DAYS Performed at [facility]    Report Status 10/03/2014 FINAL  Final  Surgical pcr screen     Status: None   Collection Time: 10/07/14  1:32 AM  Result Value Ref Range Status   MRSA, PCR NEGATIVE NEGATIVE Final   Staphylococcus aureus NEGATIVE NEGATIVE Final    Comment:        The Xpert SA Assay (FDA approved for NASAL specimens in patients  over 39 years of age), is one component of a comprehensive surveillance program.  Test performance has been validated by [employer] for patients greater than or equal to 77 year old. It is not intended to diagnose infection nor to guide or monitor treatment.      Studies: No results found.  Scheduled Meds: . ALPRAZolam  1 mg Oral BID  . [START ON 10/08/2014] heparin  5,000 Units Subcutaneous 3 times per day  . insulin aspart  0-15 Units Subcutaneous TID WC  . insulin aspart  0-5 Units Subcutaneous QHS  . megestrol  40 mg Oral Daily  . piperacillin-tazobactam (ZOSYN)  IV  3.375 g Intravenous Q8H  . sodium chloride  10-40 mL Intracatheter Q12H  . vancomycin  1,000 mg Intravenous 4 times per day  . venlafaxine XR  300 mg Oral Q breakfast   Continuous Infusions: . 0.9 % NaCl with KCl 20 mEq / L 125 mL/hr at 10/07/14 1542    Principal Problem:   Left breast abscess Active Problems:   Morbid obesity with BMI of 50.0-59.9, adult   Type 2 diabetes mellitus without complication   Hypokalemia   Abscess of breast    Time spent: 46mins    Judy Nelson  [facility] Pager Nelson. If 7PM-7AM, please contact night-coverage at [email or web address], password [facility] 10/07/2014, 8:23 PM  LOS: 2 days

## 2014-10-07 NOTE — Progress Notes (Signed)
ANTIBIOTIC CONSULT NOTE  Pharmacy Consult for Vancomycin/Zosyn Indication: Breast Abscess  No Known Allergies  Patient Measurements: Height: 5\' 7"  (170.2 cm) Weight: (!) 388 lb 0.2 oz (176 kg) IBW/kg (Calculated) : 61.6  Vital Signs: Temp: 98.9 F (37.2 C) (12/22 0602) Temp Source: Oral (12/22 0602) BP: 124/66 mmHg (12/22 0602) Pulse Rate: 110 (12/22 0602) Intake/Output from previous day: 12/21 0701 - 12/22 0700 In: 1318.3 [P.O.:360; I.V.:508.3; IV Piggyback:450] Out: -  Intake/Output from this shift:    Labs:  Recent Labs  10/05/14 1945 10/06/14 0532 10/07/14 0500  WBC 15.1* 13.7* 16.4*  HGB 10.0* 10.1* 9.4*  PLT 149* 148* 160  CREATININE 0.67 0.85 0.66   Estimated Creatinine Clearance: 177.5 mL/min (by C-G formula based on Cr of 0.66). No results for input(s): VANCOTROUGH, VANCOPEAK, VANCORANDOM, GENTTROUGH, GENTPEAK, GENTRANDOM, TOBRATROUGH, TOBRAPEAK, TOBRARND, AMIKACINPEAK, AMIKACINTROU, AMIKACIN in the last 72 hours.   Microbiology: Recent Results (from the past 720 hour(s))  Culture, routine-abscess     Status: None   Collection Time: 09/30/14 10:20 AM  Result Value Ref Range Status   Specimen Description ABSCESS ASPIRATE LEFT BREAST  Final   Special Requests DOXYCYCLINE q/4days  Final   Gram Stain   Final    ABUNDANT WBC PRESENT,BOTH PMN AND MONONUCLEAR NO SQUAMOUS EPITHELIAL CELLS SEEN FEW GRAM POSITIVE COCCI IN PAIRS IN CLUSTERS Performed at [facility]    Culture   Final    NO GROWTH 3 DAYS Performed at [facility]    Report Status 10/03/2014 FINAL  Final  Surgical pcr screen     Status: None   Collection Time: 10/07/14  1:32 AM  Result Value Ref Range Status   MRSA, PCR NEGATIVE NEGATIVE Final   Staphylococcus aureus NEGATIVE NEGATIVE Final    Comment:        The Xpert SA Assay (FDA approved for NASAL specimens in patients over 80 years of age), is one component of a comprehensive surveillance program.  Test  performance has been validated by [employer] for patients greater than or equal to 67 year old. It is not intended to diagnose infection nor to guide or monitor treatment.    Medical History: Past Medical History  Diagnosis Date  . Ovarian cyst   . Obesity   . Anxiety   . Depression   . Anemia   . Hypertension   . Complication of anesthesia Patient stated she becomes rowdy   Medications:  Medications Prior to Admission  Medication Sig Dispense Refill  . ALPRAZolam (XANAX) 1 MG tablet Take 1 tablet (1 mg total) by mouth 2 (two) times daily. 60 tablet 3  . loratadine-pseudoephedrine (CLARITIN-D 24 HOUR) 10-240 MG per 24 hr tablet Take 1 tablet by mouth daily. 30 tablet 11  . megestrol (MEGACE) 40 MG tablet 3 a day for 5 days, 2 a day for 5 days, then 1 a day (Patient taking differently: Take 40 mg by mouth daily. ) 45 tablet 3  . metFORMIN (GLUCOPHAGE) 500 MG tablet Take 1 tablet (500 mg total) by mouth 2 (two) times daily with a meal. 60 tablet 0  . oxyCODONE-acetaminophen (PERCOCET) 7.5-325 MG per tablet Take 1-2 tablets by mouth every 6 (six) hours as needed for pain. 30 tablet 0  . venlafaxine XR (EFFEXOR-XR) 150 MG 24 hr capsule Take 2 a day (Patient taking differently: Take 300 mg by mouth daily with breakfast. Take 2 a day) 60 capsule 11  . cephALEXin (KEFLEX) 500 MG capsule Take 1 capsule (500  mg total) by mouth 3 (three) times daily. (Patient not taking: Reported on 10/05/2014) 21 capsule 0  . fluticasone (FLONASE) 50 MCG/ACT nasal spray Place 2 sprays into both nostrils daily as needed for allergies.   0  . levonorgestrel (MIRENA) 20 MCG/24HR IUD 1 each by Intrauterine route once.    . silver sulfADIAZINE (SILVADENE) 1 % cream Use to area 2-3 times per day (Patient taking differently: Apply 1 application topically 2 (two) times daily. Use to AFFECTED BREAST area 2-3 times per day) 50 g 11    Anti-infectives    Start     Dose/Rate Route Frequency Ordered Stop   10/06/14  0100  vancomycin (VANCOCIN) 1,500 mg in sodium chloride 0.9 % 500 mL IVPB  Status:  Discontinued     1,500 mg250 mL/hr over 120 Minutes Intravenous Every 8 hours 10/05/14 1633 10/05/14 1809   10/05/14 2000  piperacillin-tazobactam (ZOSYN) IVPB 3.375 g     3.375 g12.5 mL/hr over 240 Minutes Intravenous Every 8 hours 10/05/14 1820     10/05/14 1815  piperacillin-tazobactam (ZOSYN) IVPB 3.375 g  Status:  Discontinued     3.375 g100 mL/hr over 30 Minutes Intravenous  Once 10/05/14 1809 10/05/14 1818   10/05/14 1815  vancomycin (VANCOCIN) IVPB 1000 mg/200 mL premix  Status:  Discontinued     1,000 mg200 mL/hr over 60 Minutes Intravenous  Once 10/05/14 1809 10/05/14 1818   10/05/14 1530  vancomycin (VANCOCIN) IVPB 1000 mg/200 mL premix     1,000 mg200 mL/hr over 60 Minutes Intravenous  Once 10/05/14 1420 10/05/14 1630   10/05/14 1430  vancomycin (VANCOCIN) IVPB 1000 mg/200 mL premix     1,000 mg200 mL/hr over 60 Minutes Intravenous  Once 10/05/14 1420 10/05/14 1531     Assessment: Okay for Protocol, Obesity/Normalized CrCl dosing protocol will be initiate with an estimated normalized CrCl = 132 ml/min.  Recent elevated blood sugar to be corrected/monitored prior to surgery.  Goal of Therapy:  Vancomycin trough level 10-15 mcg/ml  Plan:  Vancomycin 2gm load on admission, followed by 1000mg  IV q6hrs. Measure antibiotic drug levels at steady state Follow up culture results Zosyn 3.375gm IV every 8 hours (each dose over 4 hrs) Follow-up micro data, labs, vitals.   Judy Nelson, Bonnell Placzek A 10/07/2014,10:34 AM

## 2014-10-07 NOTE — Anesthesia Postprocedure Evaluation (Signed)
  Anesthesia Post-op Note  Patient: Judy Nelson  Procedure(s) Performed: Procedure(s): INCISION AND DRAINAGE LEFT BREAST ABSCESS (Left)  Patient Location: PACU  Anesthesia Type:General  Level of Consciousness: awake, alert , oriented and patient cooperative  Airway and Oxygen Therapy: Patient Spontanous Breathing  Post-op Pain: mild  Post-op Assessment: Post-op Vital signs reviewed, Patient's Cardiovascular Status Stable, Respiratory Function Stable, Patent Airway, No signs of Nausea or vomiting and Pain level controlled  Post-op Vital Signs: Reviewed and stable  Last Vitals:  Filed Vitals:   10/07/14 1225  BP: 141/87  Pulse:   Temp:   Resp: 24    Complications: No apparent anesthesia complications

## 2014-10-07 NOTE — Transfer of Care (Signed)
Immediate Anesthesia Transfer of Care Note  Patient: Judy Nelson  Procedure(s) Performed: Procedure(s): INCISION AND DRAINAGE LEFT BREAST ABSCESS (Left)  Patient Location: PACU  Anesthesia Type:General  Level of Consciousness: awake and patient cooperative  Airway & Oxygen Therapy: Patient Spontanous Breathing and Patient connected to face mask oxygen  Post-op Assessment: Report given to PACU RN, Post -op Vital signs reviewed and stable and Patient moving all extremities  Post vital signs: Reviewed and stable  Complications: No apparent anesthesia complications

## 2014-10-07 NOTE — Op Note (Signed)
Patient:  Judy Nelson  DOB:  08/22/1986  MRN:  [id number]   Preop Diagnosis:  Left breast abscess  Postop Diagnosis:  Same  Procedure:  Incision and drainage of left breast abscess  Surgeon:  Aviva Signs, M.D.  Anes:  Gen. endotracheal  Indications:  Patient is a 28 year old black female with new onset diabetes mellitus who presents with a left breast abscess. The risks and benefits of the procedure including bleeding and infection were fully explained to the patient, who gave informed consent. She fully realizes that the wound will be left open.  Procedure note:  The patient was placed the supine position. After induction of general endotracheal anesthesia, the left breast was prepped and draped using usual sterile technique with Betadine. Surgical site confirmation was performed.  A curvilinear incision was made just superior to left nipple. The dissection was taken down to the abscess cavity which was very large. Purulent fluid was found. Anaerobic and aerobic cultures were taken and sent to microbiology. A pulse lavage was then used 2 mechanically debride any necrotic tissue. A bleeding was controlled using Bovie electrocautery. The wound was packed with iodoform nugauze. A dry sterile dressing was then applied.  All tape and needle counts were correct at the end of the procedure. The patient was extubated in the operating room and transferred to PACU in stable condition.  Complications:  None  EBL:  Minimal  Specimen:  Aerobic and anaerobic cultures of left breast abscess

## 2014-10-07 NOTE — Anesthesia Procedure Notes (Signed)
Procedure Name: Intubation Date/Time: 10/07/2014 1:45 PM Performed by: Charmaine Downs Pre-anesthesia Checklist: Patient being monitored, Suction available, Emergency Drugs available and Patient identified Patient Re-evaluated:Patient Re-evaluated prior to inductionOxygen Delivery Method: Circle system utilized Preoxygenation: Pre-oxygenation with 100% oxygen Intubation Type: IV induction, Rapid sequence and Cricoid Pressure applied Ventilation: Oral airway inserted - appropriate to patient size and Mask ventilation without difficulty Laryngoscope Size: Mac and 3 Grade View: Grade I Tube type: Oral Tube size: 7.0 mm Number of attempts: 1 Airway Equipment and Method: Stylet and Oral airway Placement Confirmation: ETT inserted through vocal cords under direct vision,  positive ETCO2 and breath sounds checked- equal and bilateral Secured at: 22 cm Tube secured with: Tape Dental Injury: Teeth and Oropharynx as per pre-operative assessment

## 2014-10-07 NOTE — Progress Notes (Addendum)
Pt very upset in PACU when voiced need to void and when offered bedpan she refused to use it. After several minutes she relented to use bedpan and voided large amt. Pt washed and dried. Bed linens changed.

## 2014-10-07 NOTE — Care Management Note (Signed)
    Page 1 of 1   10/07/2014     11:55:59 AM CARE MANAGEMENT NOTE 10/07/2014  Patient:  Judy Nelson, Judy Nelson   Account Number:  [id number]  Date Initiated:  10/07/2014  Documentation initiated by:  Jolene Provost  Subjective/Objective Assessment:   Pt from home with self care. Pt admitted for abcess of her breast. Pt has no HH services, DME's or med needs prior to admission. Pt plans to discharge home with self care. No CM needs at this time.     Action/Plan:   Anticipated DC Date:  10/09/2014   Anticipated DC Plan:  [place]  CM consult      Choice offered to / List presented to:             Status of service:  Completed, signed off Medicare Important Message given?   (If response is "NO", the following Medicare IM given date fields will be blank) Date Medicare IM given:   Medicare IM given by:   Date Additional Medicare IM given:   Additional Medicare IM given by:    Discharge Disposition:  HOME/SELF CARE  Per UR Regulation:    If discussed at Long Length of Stay Meetings, dates discussed:    Comments:  10/07/2014 [place], RN, MSN, [facility]

## 2014-10-07 NOTE — Progress Notes (Signed)
   10/07/14 1115  OBSTRUCTIVE SLEEP APNEA  Have you ever been diagnosed with sleep apnea through a sleep study? No  Do you snore loudly (loud enough to be heard through closed doors)?  1  Do you often feel tired, fatigued, or sleepy during the daytime? 1  Has anyone observed you stop breathing during your sleep? 0  Do you have, or are you being treated for high blood pressure? 1  BMI more than 35 kg/m2? 1  Age over 28 years old? 0  Neck circumference greater than 40 cm/16 inches? 1  Gender: 0  Obstructive Sleep Apnea Score 5  Score 4 or greater  Results sent to PCP

## 2014-10-08 ENCOUNTER — Encounter (HOSPITAL_COMMUNITY): Payer: Self-pay | Admitting: General Surgery

## 2014-10-08 LAB — BASIC METABOLIC PANEL
ANION GAP: 6 (ref 5–15)
BUN: 5 mg/dL — ABNORMAL LOW (ref 6–23)
CALCIUM: 8.2 mg/dL — AB (ref 8.4–10.5)
CO2: 28 mmol/L (ref 19–32)
Chloride: 104 mEq/L (ref 96–112)
Creatinine, Ser: 0.63 mg/dL (ref 0.50–1.10)
GFR calc Af Amer: >90 mL/min (ref >90)
GFR calc non Af Amer: >90 mL/min (ref >90)
Glucose, Bld: 134 mg/dL — ABNORMAL HIGH (ref 70–99)
POTASSIUM: 3.5 mmol/L (ref 3.5–5.1)
SODIUM: 138 mmol/L (ref 135–145)

## 2014-10-08 LAB — GLUCOSE, CAPILLARY
Glucose-Capillary: 137 mg/dL — ABNORMAL HIGH (ref 70–99)
Glucose-Capillary: 140 mg/dL — ABNORMAL HIGH (ref 70–99)
Glucose-Capillary: 128 mg/dL — ABNORMAL HIGH (ref 70–99)

## 2014-10-08 LAB — CBC
HEMATOCRIT: 27.6 % — AB (ref 36.0–46.0)
Hemoglobin: 9.1 g/dL — ABNORMAL LOW (ref 12.0–15.0)
MCH: 28.4 pg (ref 26.0–34.0)
MCHC: 33 g/dL (ref 30.0–36.0)
MCV: 86.3 fL (ref 78.0–100.0)
Platelets: 184 10*3/uL (ref 150–400)
RBC: 3.2 MIL/uL — AB (ref 3.87–5.11)
RDW: 14.1 % (ref 11.5–15.5)
WBC: 15.8 10*3/uL — ABNORMAL HIGH (ref 4.0–10.5)

## 2014-10-08 MED ORDER — GLYCOPYRROLATE 0.2 MG/ML IJ SOLN
INTRAMUSCULAR | Status: AC
  Filled 2014-10-08: qty 3

## 2014-10-08 MED ORDER — METFORMIN HCL 500 MG PO TABS: 500.0000 mg | ORAL_TABLET | Freq: Two times a day (BID) | ORAL | Status: DC

## 2014-10-08 MED ORDER — AMOXICILLIN-POT CLAVULANATE 875-125 MG PO TABS: 1.0000 | ORAL_TABLET | Freq: Two times a day (BID) | ORAL | Status: DC

## 2014-10-08 NOTE — Progress Notes (Signed)
Patient stable postoperatively. Initial cultures are negative. Have written home health orders for wound care of the left breast. May discharge home when medically stable. Would write for Augmentin 1 week as patient does not know she has an antibiotic at home or not. May reorder pain medications as needed. I will see patient in 1 week for follow-up.

## 2014-10-08 NOTE — Discharge Summary (Signed)
Physician Discharge Summary  Judy Nelson UMP:[id number] DOB: 18-Nov-1985 DOA: 10/05/2014  PCP: Gar Ponto, MD  Admit date: 10/05/2014 Discharge date: 10/08/2014  Time spent: 40 minutes  Recommendations for Outpatient Follow-up:  1. Patient will follow up with Dr. Arnoldo Morale  Discharge Diagnoses:  Principal Problem:   Left breast abscess Active Problems:   Morbid obesity with BMI of 50.0-59.9, adult   Type 2 diabetes mellitus without complication   Hypokalemia   Abscess of breast   Discharge Condition: improved  Diet recommendation: low carb, low salt  Filed Weights   10/05/14 1204 10/05/14 1634 10/07/14 1111  Weight: 172.367 kg (380 lb) 176 kg (388 lb 0.2 oz) 175.996 kg (388 lb)    History of present illness:  This patient was admitted to the hospital with redness, tenderness, and swelling of left breast. She was found to have a left breast abscess.  She had failed outpatient treatment and was admitted for further surgical management  Hospital Course:  Patient was started empirically on vancomycin and Zosyn. She was seen by Dr. Arnoldo Morale and underwent incision and drainage of left breast abscess. Postoperatively she is unwell. She has not been febrile. She is feeling significantly improved. Home health RN has been ordered by Dr. Arnoldo Morale and she'll follow up with him next week. She'll be continued on a course of Augmentin.  She was noted to have elevated blood sugars which is likely precipitated by her underlying infection. Hemoglobin A1c was found to be elevated at 7.9. She was started on metformin for diabetes. She can follow-up with her primary care physician for further management.  Procedures:  Incision and drainage of left breast abscess   Consultations:  General surgery  Discharge Exam: Filed Vitals:   10/08/14 1423  BP: 94/52  Pulse: 72  Temp: 98.1 F (36.7 C)  Resp: 20    General: NAD Cardiovascular: s1, s2, rrr Respiratory: cta b  Discharge  Instructions   Discharge Instructions    Call MD for:  redness, tenderness, or signs of infection (pain, swelling, redness, odor or green/yellow discharge around incision site)    Complete by:  As directed      Call MD for:  temperature >100.4    Complete by:  As directed      Diet - low sodium heart healthy    Complete by:  As directed      Diet Carb Modified    Complete by:  As directed      Increase activity slowly    Complete by:  As directed           Current Discharge Medication List    START taking these medications   Details  amoxicillin-clavulanate (AUGMENTIN) 875-125 MG per tablet Take 1 tablet by mouth 2 (two) times daily. Qty: 14 tablet, Refills: 0      CONTINUE these medications which have CHANGED   Details  metFORMIN (GLUCOPHAGE) 500 MG tablet Take 1 tablet (500 mg total) by mouth 2 (two) times daily with a meal. Qty: 60 tablet, Refills: 0      CONTINUE these medications which have NOT CHANGED   Details  ALPRAZolam (XANAX) 1 MG tablet Take 1 tablet (1 mg total) by mouth 2 (two) times daily. Qty: 60 tablet, Refills: 3    loratadine-pseudoephedrine (CLARITIN-D 24 HOUR) 10-240 MG per 24 hr tablet Take 1 tablet by mouth daily. Qty: 30 tablet, Refills: 11    megestrol (MEGACE) 40 MG tablet 3 a day for 5 days, 2 a day for  5 days, then 1 a day Qty: 45 tablet, Refills: 3    oxyCODONE-acetaminophen (PERCOCET) 7.5-325 MG per tablet Take 1-2 tablets by mouth every 6 (six) hours as needed for pain. Qty: 30 tablet, Refills: 0    venlafaxine XR (EFFEXOR-XR) 150 MG 24 hr capsule Take 2 a day Qty: 60 capsule, Refills: 11    fluticasone (FLONASE) 50 MCG/ACT nasal spray Place 2 sprays into both nostrils daily as needed for allergies.  Refills: 0    levonorgestrel (MIRENA) 20 MCG/24HR IUD 1 each by Intrauterine route once.      STOP taking these medications     cephALEXin (KEFLEX) 500 MG capsule      silver sulfADIAZINE (SILVADENE) 1 % cream        No Known  Allergies Follow-up Information    Follow up with Jamesetta So, MD. Schedule an appointment as soon as possible for a visit on 10/16/2014.   Specialty:  General Surgery   Contact information:   [address] [address] [phone]        The results of significant diagnostics from this hospitalization (including imaging, microbiology, ancillary and laboratory) are listed below for reference.    Significant Diagnostic Studies: US Guided Needle Placement  09/30/2014   CLINICAL DATA:  Left breast abscess.  EXAM: ULTRASOUND GUIDED LEFT BREAST ABSCESS ASPIRATION  COMPARISON:  Previous exams.  PROCEDURE: Using sterile technique, 2% lidocaine, ultrasound guidance, and 14 gauge needle, aspiration was performed of complicated fluid collection within the subareolar left breast consistent with an abscess. 16 cc of purulent fluid were aspirated and sent to microbiology for cultures and sensitivities.  IMPRESSION: Ultrasound-guided aspiration of left breast abscess as discussed above. No apparent complications.  RECOMMENDATIONS: Follow-up left breast ultrasound in 2 weeks.   Electronically Signed   By: Luberta Robertson M.D.   On: 09/30/2014 13:32   US Aspiration  09/30/2014   CLINICAL DATA:  Left breast abscess.  EXAM: ULTRASOUND GUIDED LEFT BREAST ABSCESS ASPIRATION  COMPARISON:  Previous exams.  PROCEDURE: Using sterile technique, 2% lidocaine, ultrasound guidance, and 14 gauge needle, aspiration was performed of complicated fluid collection within the subareolar left breast consistent with an abscess. 16 cc of purulent fluid were aspirated and sent to microbiology for cultures and sensitivities.  IMPRESSION: Ultrasound-guided aspiration of left breast abscess as discussed above. No apparent complications.  RECOMMENDATIONS: Follow-up left breast ultrasound in 2 weeks.   Electronically Signed   By: Luberta Robertson M.D.   On: 09/30/2014 13:32   US Breast Ltd Uni Left Inc  Axilla  09/30/2014   CLINICAL DATA:  Patient developed tenderness, swelling. And warmth associated with the left breast approximately 1 week ago. She was seen by her primary care physician in [place], [place] and was placed on antibiotics (doxycycline). She states that the swelling and tenderness persists and has been referred for evaluation.  EXAM: ULTRASOUND OF THE left BREAST  COMPARISON:  None  FINDINGS: On physical exam, there is a firm tender palpable mass located within the subareolar portion of the left breast extending into the medial left breast and upper inner quadrant. By physical examination this measures approximately 8 cm in size.  Ultrasound is performed, showing a complex subareolar fluid collection which extends medially into the upper inner quadrant of the left breast. This is difficult to fully measure by ultrasound but measures approximately 4.5 x 4.3 x 1.5 cm in size. This is consistent with an abscess. Aspiration with drainage is recommended with fluid to  be sent for cultures and sensitivities. Also, I discussed the case with the patient's referring physician and the patient will be referred for surgical consultation to Dr. Aviva Signs. This is arranged for 10/02/2014.  IMPRESSION: Findings are consistent with an abscess within the subareolar portion of the left breast extending into the upper inner quadrant. Aspiration for cultures and sensitivities will be performed and reported separately. The patient is also scheduled for surgical consultation with Dr. Aviva Signs on 10/02/2014. Patient was instructed to continue her doxycycline.  RECOMMENDATION: 1. Left breast abscess aspiration with fluid sent for cultures and sensitivities. 2. Surgical consultation ( arranged for 10/02/2014). 3. Continued antibiotic treatment.  I have discussed the findings and recommendations with the patient. Results were also provided in writing at the conclusion of the visit. If applicable, a reminder letter  will be sent to the patient regarding the next appointment.  BI-RADS CATEGORY  3: Probably benign.   Electronically Signed   By: Luberta Robertson M.D.   On: 09/30/2014 12:37    Microbiology: Recent Results (from the past 240 hour(s))  Culture, routine-abscess     Status: None   Collection Time: 09/30/14 10:20 AM  Result Value Ref Range Status   Specimen Description ABSCESS ASPIRATE LEFT BREAST  Final   Special Requests DOXYCYCLINE q/4days  Final   Gram Stain   Final    ABUNDANT WBC PRESENT,BOTH PMN AND MONONUCLEAR NO SQUAMOUS EPITHELIAL CELLS SEEN FEW GRAM POSITIVE COCCI IN PAIRS IN CLUSTERS Performed at [facility]    Culture   Final    NO GROWTH 3 DAYS Performed at [facility]    Report Status 10/03/2014 FINAL  Final  Surgical pcr screen     Status: None   Collection Time: 10/07/14  1:32 AM  Result Value Ref Range Status   MRSA, PCR NEGATIVE NEGATIVE Final   Staphylococcus aureus NEGATIVE NEGATIVE Final    Comment:        The Xpert SA Assay (FDA approved for NASAL specimens in patients over 61 years of age), is one component of a comprehensive surveillance program.  Test performance has been validated by [employer] for patients greater than or equal to 65 year old. It is not intended to diagnose infection nor to guide or monitor treatment.   Anaerobic culture     Status: None (Preliminary result)   Collection Time: 10/07/14  1:10 PM  Result Value Ref Range Status   Specimen Description ABSCESS LEFT BREAST  Final   Special Requests Antibiotic Treatment: Vancomycin/Zosyn  Final   Gram Stain   Final    MODERATE WBC PRESENT,BOTH PMN AND MONONUCLEAR NO SQUAMOUS EPITHELIAL CELLS SEEN NO ORGANISMS SEEN Performed at [facility]    Culture   Final    NO ANAEROBES ISOLATED; CULTURE IN PROGRESS FOR 5 DAYS Performed at [facility]    Report Status PENDING  Incomplete  Culture, routine-abscess     Status: None (Preliminary result)    Collection Time: 10/07/14  1:10 PM  Result Value Ref Range Status   Specimen Description ABSCESS LEFT BREAST  Final   Special Requests Antibiotic Treatment: Vancomycin/Zosyn  Final   Gram Stain   Final    MODERATE WBC PRESENT,BOTH PMN AND MONONUCLEAR NO SQUAMOUS EPITHELIAL CELLS SEEN NO ORGANISMS SEEN Performed at [facility]    Culture   Final    NO GROWTH 1 DAY Performed at [facility]    Report Status PENDING  Incomplete  Labs: Basic Metabolic Panel:  Recent Labs Lab 10/02/14 1608 10/05/14 1225 10/05/14 1945 10/06/14 0532 10/06/14 1753 10/07/14 0500 10/08/14 0557  NA 131* 137  --  137  --  138 138  K 3.9 3.0*  --  3.2* 3.6* 3.6 3.5  CL 94* 97  --  97  --  104 104  CO2 24 26  --  27  --  27 28  GLUCOSE 445* 162*  --  151*  --  154* 134*  BUN 5* 5*  --  3*  --  <5* 5*  CREATININE 0.74 0.72 0.67 0.85  --  0.66 0.63  CALCIUM 9.2 9.2  --  8.9  --  8.3* 8.2*   Liver Function Tests: No results for input(s): AST, ALT, ALKPHOS, BILITOT, PROT, ALBUMIN in the last 168 hours. No results for input(s): LIPASE, AMYLASE in the last 168 hours. No results for input(s): AMMONIA in the last 168 hours. CBC:  Recent Labs Lab 10/02/14 1140 10/02/14 1608 10/05/14 1225 10/05/14 1945 10/06/14 0532 10/07/14 0500 10/08/14 0557  WBC 8.6 8.8 13.0* 15.1* 13.7* 16.4* 15.8*  NEUTROABS 6.5 6.5 9.7*  --   --   --   --   HGB 12.2 12.1 10.6* 10.0* 10.1* 9.4* 9.1*  HCT 36.7 36.1 31.7* 29.6* 30.4* 29.0* 27.6*  MCV 84.4 84.3 84.8 84.1 85.4 85.5 86.3  PLT 132* 132* 149* 149* 148* 160 184   Cardiac Enzymes: No results for input(s): CKTOTAL, CKMB, CKMBINDEX, TROPONINI in the last 168 hours. BNP: BNP (last 3 results) No results for input(s): PROBNP in the last 8760 hours. CBG:  Recent Labs Lab 10/07/14 1619 10/07/14 2116 10/08/14 0026 10/08/14 0716 10/08/14 1129  GLUCAP 173* 130* 137* 140* 128*       Signed:  Dawnita Molner  [facility] 10/08/2014, 3:52 PM

## 2014-10-08 NOTE — Discharge Instructions (Signed)
Home health to assist with normal saline wet to dry dressings daily.

## 2014-10-08 NOTE — Progress Notes (Signed)
Patient states understanding of discharge instructions,prerscriptions given.

## 2014-10-08 NOTE — Anesthesia Postprocedure Evaluation (Signed)
  Anesthesia Post-op Note  Patient: Judy Nelson  Procedure(s) Performed: Procedure(s): INCISION AND DRAINAGE LEFT BREAST ABSCESS (Left)  Patient Location:   Anesthesia Type:General  Level of Consciousness: awake, alert , oriented and patient cooperative  Airway and Oxygen Therapy: Patient Spontanous Breathing  Post-op Pain: 2 /10, mild  Post-op Assessment: Post-op Vital signs reviewed, Patient's Cardiovascular Status Stable, Respiratory Function Stable, Patent Airway, No signs of Nausea or vomiting, Adequate PO intake and Pain level controlled  Post-op Vital Signs: Reviewed and stable  Last Vitals:  Filed Vitals:   10/08/14 0607  BP: 96/42  Pulse: 80  Temp: 36.8 C  Resp: 20    Complications: No apparent anesthesia complications

## 2014-10-08 NOTE — Addendum Note (Signed)
Addendum  created 10/08/14 1104 by Charmaine Downs, CRNA   Modules edited: Notes Section   Notes Section:  File: [id number]

## 2014-10-11 LAB — CULTURE, ROUTINE-ABSCESS: CULTURE: NO GROWTH

## 2014-10-12 LAB — ANAEROBIC CULTURE

## 2014-10-17 HISTORY — PX: TYMPANOSTOMY TUBE PLACEMENT: SHX32

## 2014-10-21 ENCOUNTER — Encounter: Payer: Self-pay | Admitting: Women's Health

## 2014-10-21 ENCOUNTER — Ambulatory Visit (INDEPENDENT_AMBULATORY_CARE_PROVIDER_SITE_OTHER): Payer: Medicaid Other | Admitting: Women's Health

## 2014-10-21 VITALS — BP 144/78 | Ht 67.0 in | Wt 384.0 lb

## 2014-10-21 DIAGNOSIS — Z30431 Encounter for routine checking of intrauterine contraceptive device: Secondary | ICD-10-CM

## 2014-10-21 DIAGNOSIS — K649 Unspecified hemorrhoids: Secondary | ICD-10-CM

## 2014-10-21 DIAGNOSIS — T8332XA Displacement of intrauterine contraceptive device, initial encounter: Secondary | ICD-10-CM

## 2014-10-21 DIAGNOSIS — R635 Abnormal weight gain: Secondary | ICD-10-CM

## 2014-10-21 MED ORDER — DOXYCYCLINE HYCLATE 100 MG PO CAPS: 100.0000 mg | ORAL_CAPSULE | Freq: Two times a day (BID) | ORAL | Status: DC

## 2014-10-21 NOTE — Progress Notes (Signed)
Patient ID: Judy Nelson, female   DOB: 1986-06-27, 29 y.o.   MRN: [id number]   [facility] Visit  Patient name: Judy Nelson MRN [id number]  Date of birth: 1985/11/22  CC & HPI:  Judy Nelson is a 29 y.o. African American female presenting today for report of bad constipation w/ straining while on percocet 2wks ago for Lt breast abscess, feels like she pushed her IUD out of position- has been feeling poking sensation internally. Also w/ internal hemorrhoids, using otc creams w/ some relief. Constipation has improved some as she has cut back on percocets and is also on metformin for DM which causes looser stools for her. On Megace for BTB w/ Mirena, takes 1 daily, reports this is cause of her 29lb wt gain. She has tried to wean herself off, but bleeding resumes. Discussed other contraception options- to which she doesn't want any other.   Pertinent History Reviewed:  Medical & Surgical Hx:   Past Medical History  Diagnosis Date  . Ovarian cyst   . Obesity   . Anxiety   . Depression   . Anemia   . Hypertension   . Complication of anesthesia Patient stated she becomes rowdy   Past Surgical History  Procedure Laterality Date  . Laparoscopic ovarian cystectomy Right 02/05/2014    Procedure: LAPAROSCOPIC OVARIAN CYSTECTOMY;  Surgeon: Florian Buff, MD;  Location: [facility];  Service: Gynecology;  Laterality: Right;  . Incision and drainage abscess Left 10/07/2014    Procedure: INCISION AND DRAINAGE LEFT BREAST ABSCESS;  Surgeon: Jamesetta So, MD;  Location: [facility];  Service: General;  Laterality: Left;   Medications: Reviewed & Updated - see associated section Social History: Reviewed -  reports that she has been smoking Cigarettes.  She has a 2 pack-year smoking history. She has never used smokeless tobacco.  Objective Findings:  Vitals: BP 144/78 mmHg  Ht 5\' 7"  (1.702 m)  Wt 384 lb (174.181 kg)  BMI 60.13 kg/m2  Physical Examination: General appearance - alert, well  appearing, and in no distress Pelvic - vagina normal, bottom of mirena IUD visible at cervix Called Dr. Elonda Husky as he had left for lunch, and states ok to push back up Cervix prepped w/ betadine and single tooth tenaculum placed, used [place] forceps and easily advanced Mirena back to uterine fundus w/o any difficulties, pt tolerated procedure well Correct fundal placement verified via transvaginal u/s by tasha, ultrasonographer  No results found for this or any previous visit (from the past 24 hour(s)).   Assessment & Plan:  A:   Malpositioned IUD  Hemorrhoids  Weight gain she attributes to megace for BTB P:  Rx doxycycline 100mg  BID x 14d for prophylaxis  Printed constipation info, continue otc hemorrhoid creams- if stop helping let us know  Discussed trying to come off of megace to see how bleeding does  F/U after June for pap & physical   Tawnya Crook CNM, [facility] 10/21/2014 12:52 PM

## 2014-10-21 NOTE — Patient Instructions (Signed)
Constipation  Drink plenty of fluid, preferably water, throughout the day  Eat foods high in fiber such as fruits, vegetables, and grains  Exercise, such as walking, is a good way to keep your bowels regular  Drink warm fluids, especially warm prune juice, or decaf coffee  Eat a 1/2 cup of real oatmeal (not instant), 1/2 cup applesauce, and 1/2-1 cup warm prune juice every day  If needed, you may take Colace (docusate sodium) stool softener once or twice a day to help keep the stool soft. If you are pregnant, wait until you are out of your first trimester (12-14 weeks of pregnancy)  If you still are having problems with constipation, you may take Miralax once daily as needed to help keep your bowels regular.  If you are pregnant, wait until you are out of your first trimester (12-14 weeks of pregnancy)   About Hemorrhoids  Hemorrhoids are swollen veins in the lower rectum and anus.  Also called piles, hemorrhoids are a common problem.  Hemorrhoids may be internal (inside the rectum) or external (around the anus).  Internal Hemorrhoids  Internal hemorrhoids are often painless, but they rarely cause bleeding.  The internal veins may stretch and fall down (prolapse) through the anus to the outside of the body.  The veins may then become irritated and painful.  External Hemorrhoids  External hemorrhoids can be easily seen or felt around the anal opening.  They are under the skin around the anus.  When the swollen veins are scratched or broken by straining, rubbing or wiping they sometimes bleed.  How Hemorrhoids Occur  Veins in the rectum and around the anus tend to swell under pressure.  Hemorrhoids can result from increased pressure in the veins of your anus or rectum.  Some sources of pressure are:   Straining to have a bowel movement because of constipation  Waiting too long to have a bowel movement  Coughing and sneezing often  Sitting for extended periods of time, including  on the toilet  Diarrhea  Obesity  Trauma or injury to the anus  Some liver diseases  Stress  Family history of hemorrhoids  Pregnancy  Pregnant women should try to avoid becoming constipated, because they are more likely to have hemorrhoids during pregnancy.  In the last trimester of pregnancy, the enlarged uterus may press on blood vessels and causes hemorrhoids.  In addition, the strain of childbirth sometimes causes hemorrhoids after the birth.  Symptoms of Hemorrhoids  Some symptoms of hemorrhoids include:  Swelling and/or a tender lump around the anus  Itching, mild burning and bleeding around the anus  Painful bowel movements with or without constipation  Bright red blood covering the stool, on toilet paper or in the toilet bowel.   Symptoms usually go away within a few days.  Always talk to your doctor about any bleeding to make sure it is not from some other causes.  Diagnosing and Treating Hemorrhoids  Diagnosis is made by an examination by your healthcare provider.  Special test can be performed by your doctor.    Most cases of hemorrhoids can be treated with:  High-fiber diet: Eat more high-fiber foods, which help prevent constipation.  Ask for more detailed fiber information on types and sources of fiber from your healthcare provider.  Fluids: Drink plenty of water.  This helps soften bowel movements so they are easier to pass.  Sitz baths and cold packs: Sitting in lukewarm water two or three times a day for 15  minutes cleases the anal area and may relieve discomfort.  If the water is too hot, swelling around the anus will get worse.  Placing a cloth-covered ice pack on the anus for ten minutes four times a day can also help reduce selling.  Gently pushing a prolapsed hemorrhoid back inside after the bath or ice pack can be helpful.  Medications: For mild discomfort, your healthcare provider may suggest over-the-counter pain medication or prescribe a cream or  ointment for topical use.  The cream may contain witch hazel, zinc oxide or petroleum jelly.  Medicated suppositories are also a treatment option.  Always consult your doctor before applying medications or creams.  Procedures and surgeries: There are also a number of procedures and surgeries to shrink or remove hemorrhoids in more serious cases.  Talk to your physician about these options.  You can often prevent hemorrhoids or keep them from becoming worse by maintaining a healthy lifestyle.  Eat a fiber-rich diet of fruits, vegetables and whole grains.  Also, drink plenty of water and exercise regularly.   2007, [employer] Doc.30

## 2014-12-04 ENCOUNTER — Ambulatory Visit (INDEPENDENT_AMBULATORY_CARE_PROVIDER_SITE_OTHER): Payer: Medicaid Other | Admitting: Otolaryngology

## 2014-12-18 ENCOUNTER — Ambulatory Visit (INDEPENDENT_AMBULATORY_CARE_PROVIDER_SITE_OTHER): Payer: Medicaid Other | Admitting: Otolaryngology

## 2014-12-18 DIAGNOSIS — H6983 Other specified disorders of Eustachian tube, bilateral: Secondary | ICD-10-CM

## 2014-12-18 DIAGNOSIS — H900 Conductive hearing loss, bilateral: Secondary | ICD-10-CM

## 2014-12-18 DIAGNOSIS — H6122 Impacted cerumen, left ear: Secondary | ICD-10-CM

## 2014-12-18 NOTE — Progress Notes (Signed)
Patient ID: Judy Nelson, female   DOB: 1986/02/28, 29 y.o.   MRN: [id number] Chief Complaint  Patient presents with  . lump on lt breast    check ears.    Blood pressure 120/80, weight 380 lb 9.6 oz (172.639 kg), not currently breastfeeding.  Pt with some tenderness in left breast No erythema no masses she has felt Also having some pressure in ears stuffy Also having allergy issues  Breast breast itself is not tender, it is the chest wall no masses or infection Ears clear some fluid behind no erythema  Chest wall pain Allergies PP depression responding to effexor and continue Xanax prn  claritin for allergy issues

## 2015-01-08 ENCOUNTER — Ambulatory Visit (INDEPENDENT_AMBULATORY_CARE_PROVIDER_SITE_OTHER): Payer: Medicaid Other | Admitting: Otolaryngology

## 2015-01-08 DIAGNOSIS — H6983 Other specified disorders of Eustachian tube, bilateral: Secondary | ICD-10-CM | POA: Diagnosis not present

## 2015-01-08 DIAGNOSIS — H6523 Chronic serous otitis media, bilateral: Secondary | ICD-10-CM | POA: Diagnosis not present

## 2015-01-08 DIAGNOSIS — J310 Chronic rhinitis: Secondary | ICD-10-CM | POA: Diagnosis not present

## 2015-01-08 DIAGNOSIS — H900 Conductive hearing loss, bilateral: Secondary | ICD-10-CM | POA: Diagnosis not present

## 2015-01-08 DIAGNOSIS — J343 Hypertrophy of nasal turbinates: Secondary | ICD-10-CM

## 2015-02-02 ENCOUNTER — Other Ambulatory Visit: Payer: Self-pay | Admitting: Obstetrics & Gynecology

## 2015-02-19 ENCOUNTER — Ambulatory Visit (INDEPENDENT_AMBULATORY_CARE_PROVIDER_SITE_OTHER): Payer: Medicaid Other | Admitting: Otolaryngology

## 2015-02-19 DIAGNOSIS — H7203 Central perforation of tympanic membrane, bilateral: Secondary | ICD-10-CM | POA: Diagnosis not present

## 2015-02-19 DIAGNOSIS — H6983 Other specified disorders of Eustachian tube, bilateral: Secondary | ICD-10-CM

## 2015-04-14 ENCOUNTER — Ambulatory Visit: Payer: Medicaid Other | Admitting: Obstetrics & Gynecology

## 2015-04-17 ENCOUNTER — Ambulatory Visit: Payer: Medicaid Other | Admitting: Obstetrics & Gynecology

## 2015-04-24 ENCOUNTER — Other Ambulatory Visit: Payer: Self-pay | Admitting: Obstetrics & Gynecology

## 2015-06-08 ENCOUNTER — Other Ambulatory Visit: Payer: Self-pay | Admitting: Obstetrics & Gynecology

## 2015-06-09 ENCOUNTER — Other Ambulatory Visit: Payer: Self-pay | Admitting: *Deleted

## 2015-06-09 MED ORDER — ALPRAZOLAM 1 MG PO TABS: ORAL_TABLET | ORAL | Status: DC

## 2015-07-15 ENCOUNTER — Other Ambulatory Visit (HOSPITAL_COMMUNITY)
Admission: RE | Admit: 2015-07-15 | Discharge: 2015-07-15 | Disposition: A | Discharge status: Home / Self Care | Payer: Medicaid Other | Source: Ambulatory Visit | Attending: Obstetrics and Gynecology | Admitting: Obstetrics and Gynecology

## 2015-07-15 ENCOUNTER — Encounter: Payer: Self-pay | Admitting: Advanced Practice Midwife

## 2015-07-15 ENCOUNTER — Ambulatory Visit (INDEPENDENT_AMBULATORY_CARE_PROVIDER_SITE_OTHER): Payer: Medicaid Other | Admitting: Advanced Practice Midwife

## 2015-07-15 VITALS — BP 157/78 | HR 100 | Ht 67.0 in | Wt 383.0 lb

## 2015-07-15 DIAGNOSIS — Z13220 Encounter for screening for lipoid disorders: Secondary | ICD-10-CM

## 2015-07-15 DIAGNOSIS — Z1329 Encounter for screening for other suspected endocrine disorder: Secondary | ICD-10-CM

## 2015-07-15 DIAGNOSIS — E119 Type 2 diabetes mellitus without complications: Secondary | ICD-10-CM

## 2015-07-15 DIAGNOSIS — Z01419 Encounter for gynecological examination (general) (routine) without abnormal findings: Secondary | ICD-10-CM

## 2015-07-15 DIAGNOSIS — Z0000 Encounter for general adult medical examination without abnormal findings: Secondary | ICD-10-CM | POA: Diagnosis not present

## 2015-07-15 NOTE — Progress Notes (Signed)
[facility] Visit  Patient name: Judy Nelson MRN [id number]  Date of birth: 12-23-1985  CC & HPI:  Judy Nelson is a 29 y.o. African American female presenting today for pap and physical.  She is mad at her PCP, feels like they are racist.  Wants phentermine, getting gmarried in Feb.  Has successfully lost weight with low carb. Thinks megace made her gain more weight, but was only taking 40mg /day intermittently.  Interested in losing weight with diet changes.  Nex dx of Type 2 DM, says checks blood sugars and they are "good.".  Uses xanax, but feels like she needs higher dose.  Sees a counselor weekly, but she doesn't prescribe meds.  "I see y'all for everything here.":  Pertinent History Reviewed:  Medical & Surgical Hx:   Past Medical History  Diagnosis Date  . Ovarian cyst   . Obesity   . Anxiety   . Depression   . Anemia   . Hypertension   . Complication of anesthesia Patient stated she becomes rowdy   Past Surgical History  Procedure Laterality Date  . Laparoscopic ovarian cystectomy Right 02/05/2014    Procedure: LAPAROSCOPIC right retroperitoneal (NOT OVARIAN) CYSTECTOMY;  Surgeon: Florian Buff, MD;  Location: [facility];  Service: Gynecology;  Laterality: Right;  . Incision and drainage abscess Left 10/07/2014    Procedure: INCISION AND DRAINAGE LEFT BREAST ABSCESS;  Surgeon: Jamesetta So, MD;  Location: [facility];  Service: General;  Laterality: Left;   Family History  Problem Relation Age of Onset  . Diabetes Father   . Hypertension Father   . Heart disease Daughter     heart murmur  . Cancer Maternal Grandmother     ovarian cancer  . Obesity Paternal Grandmother   . Diabetes Paternal Grandfather     Current outpatient prescriptions:  .  ALPRAZolam (XANAX) 1 MG tablet, TAKE 1 TABLET BY MOUTH TWICE DAILY, Disp: 60 tablet, Rfl: 3 .  Docusate Calcium (STOOL SOFTENER PO), Take 1 capsule by mouth as needed. , Disp: , Rfl:  .  levonorgestrel (MIRENA) 20  MCG/24HR IUD, 1 each by Intrauterine route once., Disp: , Rfl:  .  metFORMIN (GLUCOPHAGE-XR) 500 MG 24 hr tablet, TK 1 T PO QD, Disp: , Rfl: 1 .  oxyCODONE-acetaminophen (PERCOCET) 7.5-325 MG per tablet, Take 1-2 tablets by mouth every 6 (six) hours as needed for pain., Disp: 30 tablet, Rfl: 0 .  UNKNOWN TO PATIENT, Pt states taking an abx but unsure of name, Disp: , Rfl:  .  venlafaxine XR (EFFEXOR-XR) 150 MG 24 hr capsule, TAKE 2 CAPSULES BY MOUTH EVERY DAY, Disp: 60 capsule, Rfl: 3 .  ALPRAZolam (XANAX) 1 MG tablet, Take one tablet by mouth twice daily (Patient not taking: Reported on 07/15/2015), Disp: 60 tablet, Rfl: 4 .  amoxicillin-clavulanate (AUGMENTIN) 875-125 MG per tablet, Take 1 tablet by mouth 2 (two) times daily. (Patient not taking: Reported on 10/21/2014), Disp: 14 tablet, Rfl: 0 .  doxycycline (VIBRAMYCIN) 100 MG capsule, Take 1 capsule (100 mg total) by mouth 2 (two) times daily. X 14 days (Patient not taking: Reported on 07/15/2015), Disp: 28 capsule, Rfl: 0 .  fluticasone (FLONASE) 50 MCG/ACT nasal spray, Place 2 sprays into both nostrils daily as needed for allergies. , Disp: , Rfl: 0 .  loratadine-pseudoephedrine (CLARITIN-D 24 HOUR) 10-240 MG per 24 hr tablet, Take 1 tablet by mouth daily. (Patient not taking: Reported on 10/21/2014), Disp: 30 tablet, Rfl: 11 .  megestrol (MEGACE) 40  MG tablet, TAKE 3 TABLETS BY MOUTH DAILY FOR 5 DAYS, THEN 2 TABLETS DAILY FOR 5 DAYS, THEN 1 TAKE 1 TABLET BY MOUTH DAILY (Patient not taking: Reported on 07/15/2015), Disp: 45 tablet, Rfl: 11 .  metFORMIN (GLUCOPHAGE) 500 MG tablet, Take 1 tablet (500 mg total) by mouth 2 (two) times daily with a meal. (Patient not taking: Reported on 07/15/2015), Disp: 60 tablet, Rfl: 0 Social History: Reviewed -  reports that she has been smoking Cigarettes.  She has a 2 pack-year smoking history. She has never used smokeless tobacco.  Review of Systems:   Constitutional: Negative for fever and chills Eyes: Negative  for visual disturbances Respiratory: Negative for shortness of breath, dyspnea Cardiovascular: Negative for chest pain or palpitations  Gastrointestinal: Negative for vomiting, diarrhea and constipation; no abdominal pain Genitourinary: Negative for dysuria and urgency, vaginal irritation or itching Musculoskeletal: Negative for back pain, joint pain, myalgias  Neurological: Negative for dizziness and headaches    Objective Findings:  Vitals: BP 157/78 mmHg  Pulse 100  Ht 5\' 7"  (1.702 m)  Wt 383 lb (173.728 kg)  BMI 59.97 kg/m2  LMP 07/08/2015  Physical Examination: General appearance - alert, well appearing, and in no distress Mental status - alert, oriented to person, place, and time Chest - clear to auscultation, no wheezes, rales or rhonchi, symmetric air entry Heart - normal rate and regular rhythm Abdomen - soft, nontender Breasts - breasts appear normal, no suspicious masses, no nipple changes or axillary nodes.  Recurrant skin abscess left breast, on abx now. Soft, nonender, non erythemous Pelvic - normal external genitalia, vulva, vagina, cervix. IUD strings visible. Uterus and adnexa not palpable d/t habitus, but non tender.  Extremities - no pedal edema noted  No results found for this or any previous visit (from the past 24 hour(s)).       Assessment & Plan:  A:   Normal GYN exam.   Occ BTB with IUD   Morbid Obesity P:  If pap normal, q 3 years   F/U with Dr. Elonda Husky for nutritional counseling/xanax discussion   CRESENZO-DISHMAN,FRANCES CNM 07/15/2015 2:41 PM

## 2015-07-17 LAB — CYTOLOGY - PAP

## 2015-07-21 ENCOUNTER — Ambulatory Visit (INDEPENDENT_AMBULATORY_CARE_PROVIDER_SITE_OTHER): Payer: Medicaid Other | Admitting: Obstetrics & Gynecology

## 2015-07-21 ENCOUNTER — Encounter: Payer: Self-pay | Admitting: Obstetrics & Gynecology

## 2015-07-21 VITALS — BP 140/90 | HR 80 | Wt 382.0 lb

## 2015-07-21 DIAGNOSIS — F32A Depression, unspecified: Secondary | ICD-10-CM

## 2015-07-21 DIAGNOSIS — F329 Major depressive disorder, single episode, unspecified: Secondary | ICD-10-CM

## 2015-07-21 DIAGNOSIS — F419 Anxiety disorder, unspecified: Secondary | ICD-10-CM | POA: Diagnosis not present

## 2015-07-21 LAB — COMPREHENSIVE METABOLIC PANEL
A/G RATIO: 1.1 (ref 1.1–2.5)
ALT: 9 IU/L (ref 0–32)
AST: 10 IU/L (ref 0–40)
Albumin: 3.8 g/dL (ref 3.5–5.5)
Alkaline Phosphatase: 129 IU/L — ABNORMAL HIGH (ref 39–117)
BUN/Creatinine Ratio: 8 (ref 8–20)
BUN: 6 mg/dL (ref 6–20)
Bilirubin Total: 0.3 mg/dL (ref 0.0–1.2)
CO2: 25 mmol/L (ref 18–29)
Calcium: 9.2 mg/dL (ref 8.7–10.2)
Chloride: 98 mmol/L (ref 97–108)
Creatinine, Ser: 0.78 mg/dL (ref 0.57–1.00)
GFR calc Af Amer: 120 mL/min/{1.73_m2} (ref >59)
GFR, EST NON AFRICAN AMERICAN: 104 mL/min/{1.73_m2} (ref >59)
Globulin, Total: 3.5 g/dL (ref 1.5–4.5)
Glucose: 108 mg/dL — ABNORMAL HIGH (ref 65–99)
POTASSIUM: 4.1 mmol/L (ref 3.5–5.2)
Sodium: 138 mmol/L (ref 134–144)
TOTAL PROTEIN: 7.3 g/dL (ref 6.0–8.5)

## 2015-07-21 LAB — CBC
HEMOGLOBIN: 11.9 g/dL (ref 11.1–15.9)
Hematocrit: 36.4 % (ref 34.0–46.6)
MCH: 26.7 pg (ref 26.6–33.0)
MCHC: 32.7 g/dL (ref 31.5–35.7)
MCV: 82 fL (ref 79–97)
Platelets: 234 10*3/uL (ref 150–379)
RBC: 4.46 x10E6/uL (ref 3.77–5.28)
RDW: 15.5 % — ABNORMAL HIGH (ref 12.3–15.4)
WBC: 12.5 10*3/uL — ABNORMAL HIGH (ref 3.4–10.8)

## 2015-07-21 LAB — LIPID PANEL
Chol/HDL Ratio: 4.4 ratio units (ref 0.0–4.4)
Cholesterol, Total: 190 mg/dL (ref 100–199)
HDL: 43 mg/dL (ref >39)
LDL Calculated: 127 mg/dL — ABNORMAL HIGH (ref 0–99)
TRIGLYCERIDES: 99 mg/dL (ref 0–149)
VLDL CHOLESTEROL CAL: 20 mg/dL (ref 5–40)

## 2015-07-21 LAB — HEMOGLOBIN A1C
Est. average glucose Bld gHb Est-mCnc: 148 mg/dL
Hgb A1c MFr Bld: 6.8 % — ABNORMAL HIGH (ref 4.8–5.6)

## 2015-07-21 LAB — TSH: TSH: 1.58 u[IU]/mL (ref 0.450–4.500)

## 2015-07-21 MED ORDER — ALPRAZOLAM 1 MG PO TABS: ORAL_TABLET | ORAL | Status: DC

## 2015-08-06 IMAGING — US US ASPIRATION
1 series · 1 of 1 positions shown · non-contrast
Comparison: Previous exams.

ADDENDUM:
The patient underwent surgical drainage of left breast abscess on
10/07/2014 by Dr. Ycnan. Follow-up will be via Dr. Ycnan.
CLINICAL DATA: Left breast abscess.

EXAM:
ULTRASOUND GUIDED LEFT BREAST ABSCESS ASPIRATION

[Series 1: us aspiration · 0.06mm/px · 1 of 1 slices shown]
[im 1/1]
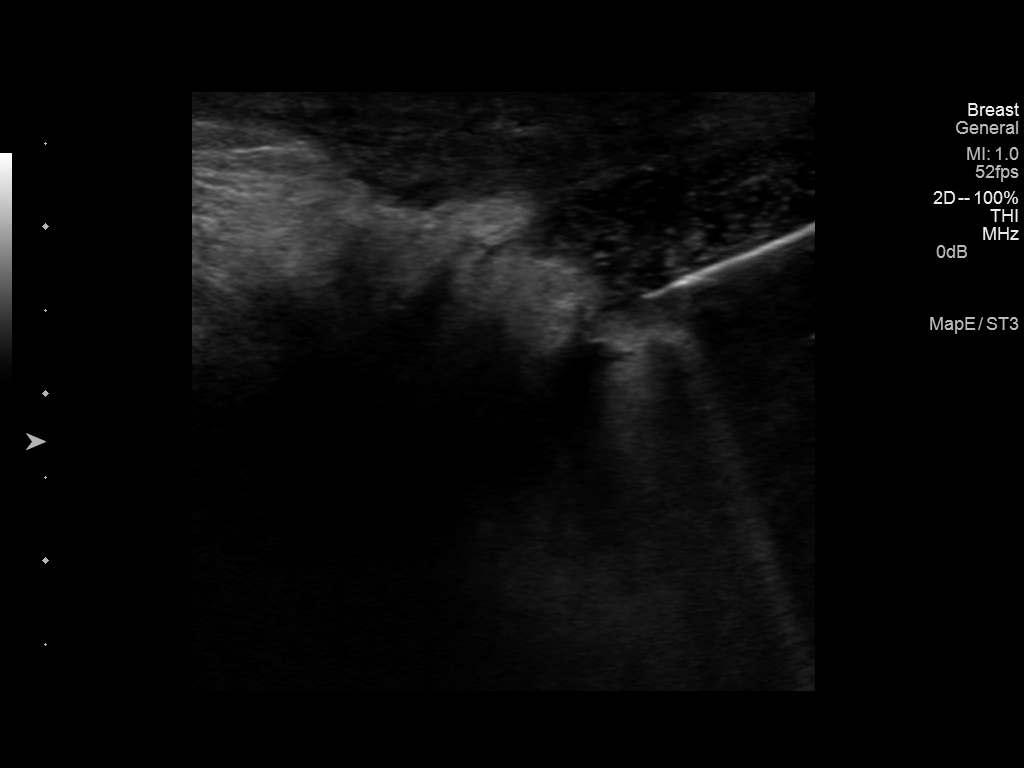

[1 of 1 positions shown; findings below may reference images not displayed]

PROCEDURE:
Using sterile technique, 2% lidocaine, ultrasound guidance, and 14
gauge needle, aspiration was performed of complicated fluid
collection within the subareolar left breast consistent with an
abscess. 16 cc of purulent fluid were aspirated and sent to
microbiology for cultures and sensitivities.
IMPRESSION: Ultrasound-guided aspiration of left breast abscess as discussed
above. No apparent complications.

RECOMMENDATIONS:
Follow-up left breast ultrasound in 2 weeks.

## 2015-08-18 DIAGNOSIS — L905 Scar conditions and fibrosis of skin: Secondary | ICD-10-CM

## 2015-08-18 HISTORY — DX: Scar conditions and fibrosis of skin: L90.5
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
## 2015-08-27 ENCOUNTER — Ambulatory Visit (INDEPENDENT_AMBULATORY_CARE_PROVIDER_SITE_OTHER): Payer: Medicaid Other | Admitting: Otolaryngology

## 2015-09-01 ENCOUNTER — Encounter (HOSPITAL_BASED_OUTPATIENT_CLINIC_OR_DEPARTMENT_OTHER): Payer: Self-pay | Admitting: *Deleted

## 2015-09-01 DIAGNOSIS — R12 Heartburn: Secondary | ICD-10-CM

## 2015-09-01 HISTORY — DX: Heartburn: R12

## 2015-09-01 NOTE — Pre-Procedure Instructions (Signed)
To come for anesthesia airway evaluation; to have BMET if procedure will be done under MAC or General Anesthesia.

## 2015-09-03 ENCOUNTER — Ambulatory Visit: Payer: Self-pay | Admitting: Specialist

## 2015-09-03 ENCOUNTER — Encounter (HOSPITAL_BASED_OUTPATIENT_CLINIC_OR_DEPARTMENT_OTHER): Payer: Self-pay | Admitting: Anesthesiology

## 2015-09-03 ENCOUNTER — Other Ambulatory Visit: Payer: Self-pay | Admitting: Specialist

## 2015-09-03 MED ORDER — GLYCOPYRROLATE 0.2 MG/ML IJ SOLN: 0.2000 mg | Freq: Once | INTRAMUSCULAR | Status: DC | PRN

## 2015-09-03 MED ORDER — MIDAZOLAM HCL 2 MG/2ML IJ SOLN: 1.0000 mg | INTRAMUSCULAR | Status: DC | PRN

## 2015-09-03 MED ORDER — LACTATED RINGERS IV SOLN: INTRAVENOUS | Status: DC

## 2015-09-03 MED ORDER — SCOPOLAMINE 1 MG/3DAYS TD PT72: 1.0000 | MEDICATED_PATCH | Freq: Once | TRANSDERMAL | Status: DC | PRN

## 2015-09-03 MED ORDER — FENTANYL CITRATE (PF) 100 MCG/2ML IJ SOLN: 50.0000 ug | INTRAMUSCULAR | Status: DC | PRN

## 2015-09-04 ENCOUNTER — Encounter (HOSPITAL_BASED_OUTPATIENT_CLINIC_OR_DEPARTMENT_OTHER)
Admission: RE | Admit: 2015-09-04 | Discharge: 2015-09-04 | Disposition: A | Discharge status: Home / Self Care | Payer: Medicaid Other | Source: Ambulatory Visit | Attending: Specialist | Admitting: Specialist

## 2015-09-04 DIAGNOSIS — E119 Type 2 diabetes mellitus without complications: Secondary | ICD-10-CM | POA: Diagnosis not present

## 2015-09-04 DIAGNOSIS — Z6841 Body Mass Index (BMI) 40.0 and over, adult: Secondary | ICD-10-CM | POA: Diagnosis not present

## 2015-09-04 DIAGNOSIS — L905 Scar conditions and fibrosis of skin: Secondary | ICD-10-CM | POA: Diagnosis not present

## 2015-09-04 DIAGNOSIS — E669 Obesity, unspecified: Secondary | ICD-10-CM | POA: Diagnosis not present

## 2015-09-04 DIAGNOSIS — F329 Major depressive disorder, single episode, unspecified: Secondary | ICD-10-CM | POA: Diagnosis not present

## 2015-09-04 DIAGNOSIS — N63 Unspecified lump in breast: Secondary | ICD-10-CM | POA: Diagnosis present

## 2015-09-04 DIAGNOSIS — F17210 Nicotine dependence, cigarettes, uncomplicated: Secondary | ICD-10-CM | POA: Diagnosis not present

## 2015-09-04 DIAGNOSIS — Z79899 Other long term (current) drug therapy: Secondary | ICD-10-CM | POA: Diagnosis not present

## 2015-09-04 NOTE — Progress Notes (Signed)
Dr Ola Spurr for anesthesia consult.  Pt not aware she was local not general.  At this time she wants be put to sleep. Dr Ola Spurr explained case would be moved to main. Pt was agreeable. Dr Towanda Malkin called to advise of change. He spoke to pt and she agreed to local only.  Dr Ola Spurr  Informed of change states now would be up to center policy.

## 2015-09-04 NOTE — Progress Notes (Signed)
Pt told to come at same time as scheduled pt was agreeable.

## 2015-09-04 NOTE — Pre-Procedure Instructions (Signed)
Spoke with [employer], RN; we will be able to accommodate pt. for her surgery as a straight Local case.  Dr. Towanda Malkin notified of same.

## 2015-09-07 ENCOUNTER — Encounter (HOSPITAL_BASED_OUTPATIENT_CLINIC_OR_DEPARTMENT_OTHER): Payer: Self-pay

## 2015-09-07 ENCOUNTER — Encounter (HOSPITAL_BASED_OUTPATIENT_CLINIC_OR_DEPARTMENT_OTHER)
Admission: RE | Disposition: A | Discharge status: Home / Self Care | Payer: Self-pay | Source: Ambulatory Visit | Attending: Specialist

## 2015-09-07 ENCOUNTER — Ambulatory Visit (HOSPITAL_BASED_OUTPATIENT_CLINIC_OR_DEPARTMENT_OTHER)
Admission: RE | Admit: 2015-09-07 | Discharge: 2015-09-07 | Disposition: A | Discharge status: Home / Self Care | Payer: Medicaid Other | Source: Ambulatory Visit | Attending: Specialist | Admitting: Specialist

## 2015-09-07 DIAGNOSIS — L905 Scar conditions and fibrosis of skin: Secondary | ICD-10-CM | POA: Insufficient documentation

## 2015-09-07 DIAGNOSIS — Z79899 Other long term (current) drug therapy: Secondary | ICD-10-CM | POA: Insufficient documentation

## 2015-09-07 DIAGNOSIS — F17210 Nicotine dependence, cigarettes, uncomplicated: Secondary | ICD-10-CM | POA: Insufficient documentation

## 2015-09-07 DIAGNOSIS — Z6841 Body Mass Index (BMI) 40.0 and over, adult: Secondary | ICD-10-CM | POA: Insufficient documentation

## 2015-09-07 DIAGNOSIS — E119 Type 2 diabetes mellitus without complications: Secondary | ICD-10-CM | POA: Insufficient documentation

## 2015-09-07 DIAGNOSIS — E669 Obesity, unspecified: Secondary | ICD-10-CM | POA: Insufficient documentation

## 2015-09-07 DIAGNOSIS — F329 Major depressive disorder, single episode, unspecified: Secondary | ICD-10-CM | POA: Insufficient documentation

## 2015-09-07 HISTORY — DX: Scar conditions and fibrosis of skin: L90.5

## 2015-09-07 HISTORY — DX: Headache, unspecified: R51.9

## 2015-09-07 HISTORY — PX: MASS EXCISION: SHX2000

## 2015-09-07 HISTORY — DX: Type 2 diabetes mellitus without complications: E11.9

## 2015-09-07 HISTORY — DX: Heartburn: R12

## 2015-09-07 HISTORY — DX: Headache: R51

## 2015-09-07 HISTORY — DX: Personal history of diseases of the blood and blood-forming organs and certain disorders involving the immune mechanism: Z86.2

## 2015-09-07 LAB — GLUCOSE, CAPILLARY: Glucose-Capillary: 108 mg/dL — ABNORMAL HIGH (ref 65–99)

## 2015-09-07 SURGERY — MINOR EXCISION OF MASS
Anesthesia: LOCAL | Site: Breast | Laterality: Left

## 2015-09-07 MED ORDER — LIDOCAINE-EPINEPHRINE 0.5 %-1:200000 IJ SOLN
INTRAMUSCULAR | Status: DC | PRN
  Administered 2015-09-07: 25 mL

## 2015-09-07 SURGICAL SUPPLY — 21 items
BLADE KNIFE PERSONA 15 (BLADE) ×2 IMPLANT
ELECT REM PT RETURN 9FT ADLT (ELECTROSURGICAL) ×2
ELECTRODE REM PT RTRN 9FT ADLT (ELECTROSURGICAL) ×1 IMPLANT
GLOVE BIO SURGEON STRL SZ 6.5 (GLOVE) ×2 IMPLANT
GLOVE BIOGEL M STRL SZ7.5 (GLOVE) ×2 IMPLANT
GLOVE BIOGEL PI IND STRL 8 (GLOVE) ×1 IMPLANT
GLOVE BIOGEL PI INDICATOR 8 (GLOVE) ×1
GLOVE ECLIPSE 7.0 STRL STRAW (GLOVE) ×2 IMPLANT
GOWN STRL REUS W/ TWL XL LVL3 (GOWN DISPOSABLE) ×2 IMPLANT
GOWN STRL REUS W/TWL XL LVL3 (GOWN DISPOSABLE) ×2
IV NS 500ML (IV SOLUTION)
IV NS 500ML BAXH (IV SOLUTION) IMPLANT
NEEDLE HYPO 25X1 1.5 SAFETY (NEEDLE) ×2 IMPLANT
PACK BASIN DAY SURGERY FS (CUSTOM PROCEDURE TRAY) ×2 IMPLANT
SUT MNCRL AB 3-0 PS2 18 (SUTURE) ×2 IMPLANT
SUT MON AB 2-0 CT1 36 (SUTURE) ×2 IMPLANT
SYR 20CC LL (SYRINGE) ×2 IMPLANT
TUBE CONNECTING 20X1/4 (TUBING) ×2 IMPLANT
UNDERPAD 30X30 (UNDERPADS AND DIAPERS) ×4 IMPLANT
VAC PENCILS W/TUBING CLEAR (MISCELLANEOUS) ×2 IMPLANT
YANKAUER SUCT BULB TIP NO VENT (SUCTIONS) ×2 IMPLANT

## 2015-09-07 NOTE — Brief Op Note (Signed)
09/07/2015  8:44 AM  PATIENT:  Benjaman Pott  29 y.o. female  PRE-OPERATIVE DIAGNOSIS:  SCAR LEFT BREAST  POST-OPERATIVE DIAGNOSIS:  SCAR LEFT BREAST  PROCEDURE:  Procedure(s): Minor SCAR EXCISION LEFT BREAST, PLASTIC CLOSURE (Left)  SURGEON:  Surgeon(s) and Role:    * Cristine Polio, MD - Primary  PHYSICIAN ASSISTANT:   ASSISTANTS: none   ANESTHESIA:   local  EBL:     BLOOD ADMINISTERED:none  DRAINS: none   LOCAL MEDICATIONS USED:  LIDOCAINE   SPECIMEN:  Excision  DISPOSITION OF SPECIMEN:  PATHOLOGY  COUNTS:  YES  TOURNIQUET:  * No tourniquets in log *  DICTATION: .Other Dictation: Dictation Number [id number]  PLAN OF CARE: Discharge to home after PACU  PATIENT DISPOSITION:  PACU - hemodynamically stable.   Delay start of Pharmacological VTE agent (>24hrs) due to surgical blood loss or risk of bleeding: yes

## 2015-09-07 NOTE — Discharge Instructions (Signed)
Activity (include date of return to work if known) °As tolerated: NO showers °NO driving °No heavy activities ° °Diet:regular No restrictions: ° °Wound Care: Keep dressing clean & dry ° °Do not change dressings °For Abdominoplasties wear abdominal binder °Special Instructions: °Do not raise arms up °Continue to empty, recharge, & record drainage from J-P drains &/or °Hemovacs 2-3 times a day, as needed. °Call Doctor if any unusual problems occur such as pain, excessive °Bleeding, unrelieved Nausea/vomiting, Fever &/or chills °When lying down, keep head elevated on 2-3 pillows or back-rest °For Addominoplasties the Jack-knife position °Follow-up appointment: Please call the office. ° °The patient received discharge instruction from:___________________________________________ ° ° °Patient signature ________________________________________ / Date___________ ° ° ° °Signature of individual providing instructions/ Date________________             °

## 2015-09-08 ENCOUNTER — Encounter (HOSPITAL_BASED_OUTPATIENT_CLINIC_OR_DEPARTMENT_OTHER): Payer: Self-pay | Admitting: Specialist

## 2015-09-08 NOTE — Op Note (Signed)
NAMEASHANTY, MANYGOATS NO.:  [id number]  MEDICAL RECORD NO.:  [id number]  LOCATION:                               FACILITY:  [facility]  PHYSICIAN:  Odella Aquas. Towanda Malkin, M.D.DATE OF BIRTH:  July 10, 1986  DATE OF PROCEDURE:  09/07/2015 DATE OF DISCHARGE:  09/07/2015                              OPERATIVE REPORT   SURGEON:  Berneta Sages L. Towanda Malkin, M.D.  INDICATION FOR PROCEDURE:  A 29 year old lady with previous history of excision of mass effect involving the left breast area with recurrence. The patient has increased drainage and pain, discomfort from the area __________ severe scar cicatrix.  PROCEDURES DONE:  Excision of the area, demarcation of ductal system communication with __________ obliteration Bovie, and plastic reconstruction of the scar.  ANESTHESIA:  Local 1% with epinephrine 1:100,000 concentration, total of 30 mL.  DESCRIPTION OF PROCEDURE:  Prep was done to the left breast area with Hibiclens soap and solution, walled off with sterile towels and draped so as to make a sterile field.  Local anesthesia was injected approximately 35 mL.  This was allowed to set up; and then, excision was made of the whole area in a large elliptical fashion down the underlying breast tissue and underlying portion of the base of the mass. Hemostasis was maintained with Bovie anticoagulation, and the base was also coagulated to obliterate any ductal system, communication between the area on the nipple.  After this, the wounds were closed in layers with 2-0 Monocryl x3 layers, a subdermal suture of 3-0 Monocryl, and then a running subcuticular stitch of 3-0 Monocryl.  Steri-Strips, soft dressing were applied with pressure dressings.  She withstood the procedures very well, was taken to Recovery in excellent condition.     Odella Aquas. Towanda Malkin, M.D.     Elie Confer  D:  09/07/2015  T:  09/07/2015  Job:  [id number]

## 2015-11-01 ENCOUNTER — Other Ambulatory Visit: Payer: Self-pay | Admitting: Obstetrics & Gynecology

## 2015-11-13 ENCOUNTER — Other Ambulatory Visit: Payer: Self-pay | Admitting: Obstetrics & Gynecology

## 2015-12-11 NOTE — H&P (Signed)
Judy Nelson is an 30 y.o. female.   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 Obesity   . Anxiety   . Depression   . Sinus headache   . Heartburn 09/01/2015    no current med.  . Non-insulin dependent type 2 diabetes mellitus ([place])   . Scar of breast 08/2015    left  . History of anemia 01/2014    Past Surgical History  Procedure Laterality Date  . Laparoscopic ovarian cystectomy Right 02/05/2014    Procedure: LAPAROSCOPIC right retroperitoneal (NOT OVARIAN) CYSTECTOMY;  Surgeon: Florian Buff, MD;  Location: [facility];  Service: Gynecology;  Laterality: Right;  . Incision and drainage abscess Left 10/07/2014    Procedure: INCISION AND DRAINAGE LEFT BREAST ABSCESS;  Surgeon: Jamesetta So, MD;  Location: [facility];  Service: General;  Laterality: Left;  Marland Kitchen Tympanostomy tube placement Bilateral 10/2014  . Mass excision Left 09/07/2015    Procedure: Minor SCAR EXCISION LEFT BREAST, PLASTIC CLOSURE;  Surgeon: Cristine Polio, MD;  Location: [place] [facility];  Service: Plastics;  Laterality: Left;    Family History  Problem Relation Age of Onset  . Diabetes Father   . Hypertension Father   . Heart disease Daughter     heart murmur  . Cancer Maternal Grandmother     ovarian cancer  . Obesity Paternal Grandmother   . Diabetes Paternal Grandfather    Social History:  reports that she has been smoking Cigarettes.  She has a 10.5 pack-year smoking history. She has never used smokeless tobacco. She reports that she drinks alcohol. She reports that she does not use illicit drugs.  Allergies: No Known Allergies  No prescriptions prior to admission    No results found for this or any previous visit (from the past 48 hour(s)). No results found.  Review of Systems  Constitutional: Negative.   HENT: Negative.    Eyes: Negative.   Respiratory: Negative.   Cardiovascular: Negative.   Gastrointestinal: Negative.   Genitourinary: Negative.   Musculoskeletal: Negative.   Skin: Positive for itching and rash.  Neurological: Negative.   Endo/Heme/Allergies: Negative.   Psychiatric/Behavioral: Negative.     Blood pressure 136/63, pulse 81, temperature 98.2 F (36.8 C), temperature source Oral, resp. rate 20, height 5\' 7"  (1.702 m), weight 177.356 kg (391 lb), last menstrual period 08/04/2015, SpO2 100 %, unknown if currently breastfeeding. Physical Exam   Assessment/Plan Severe scar cicatrix under left breast for excision and plastic closure  Arrietty Dercole L, MD 12/11/2015, 7:48 PM

## 2015-12-11 NOTE — Pre-Procedure Instructions (Signed)
H &P done again on Dec 11 2015

## 2015-12-11 NOTE — Brief Op Note (Signed)
09/07/2015  7:46 PM  PATIENT:  Benjaman Pott  30 y.o. female  PRE-OPERATIVE DIAGNOSIS:  SCAR LEFT BREAST  POST-OPERATIVE DIAGNOSIS:  SCAR LEFT BREAST  PROCEDURE:  Procedure(s): Minor SCAR EXCISION LEFT BREAST, PLASTIC CLOSURE (Left)  SURGEON:  Surgeon(s) and Role:    * Cristine Polio, MD - Primary  PHYSICIAN ASSISTANT:   ASSISTANTS: none   ANESTHESIA:   general  EBL:     BLOOD ADMINISTERED:none  DRAINS: none   LOCAL MEDICATIONS USED:  LIDOCAINE   SPECIMEN:  Excision  DISPOSITION OF SPECIMEN:  PATHOLOGY  COUNTS:  YES  TOURNIQUET:  * No tourniquets in log *  DICTATION: .Other Dictation: Dictation Number [id number]  PLAN OF CARE: Excision  PATIENT DISPOSITION:  PACU - hemodynamically stable.   Delay start of Pharmacological VTE agent (>24hrs) due to surgical blood loss or risk of bleeding: yes

## 2015-12-14 ENCOUNTER — Ambulatory Visit: Payer: Self-pay | Admitting: Specialist

## 2015-12-14 NOTE — Op Note (Signed)
Activity (include date of return to work if known) °As tolerated: NO showers °NO driving °No heavy activities ° °Diet:regular No restrictions: ° °Wound Care: Keep dressing clean & dry ° °Do not change dressings °For Abdominoplasties wear abdominal binder °Special Instructions: °Do not raise arms up °Continue to empty, recharge, & record drainage from J-P drains &/or °Hemovacs 2-3 times a day, as needed. °Call Doctor if any unusual problems occur such as pain, excessive °Bleeding, unrelieved Nausea/vomiting, Fever &/or chills °When lying down, keep head elevated on 2-3 pillows or back-rest °For Addominoplasties the Jack-knife position °Follow-up appointment: Please call the office. ° °The patient received discharge instruction from:___________________________________________ ° ° °Patient signature ________________________________________ / Date___________ ° ° ° °Signature of individual providing instructions/ Date________________             °

## 2016-01-18 ENCOUNTER — Telehealth: Payer: Self-pay | Admitting: *Deleted

## 2016-01-18 ENCOUNTER — Other Ambulatory Visit: Payer: Self-pay | Admitting: Obstetrics & Gynecology

## 2016-01-19 ENCOUNTER — Telehealth: Payer: Self-pay | Admitting: Obstetrics & Gynecology

## 2016-01-19 MED ORDER — ALPRAZOLAM 1 MG PO TABS: 1.0000 mg | ORAL_TABLET | Freq: Three times a day (TID) | ORAL | Status: DC

## 2016-01-20 NOTE — Telephone Encounter (Signed)
Done

## 2016-02-18 ENCOUNTER — Ambulatory Visit (INDEPENDENT_AMBULATORY_CARE_PROVIDER_SITE_OTHER): Payer: Medicaid Other | Admitting: Otolaryngology

## 2016-02-18 DIAGNOSIS — H6983 Other specified disorders of Eustachian tube, bilateral: Secondary | ICD-10-CM | POA: Diagnosis not present

## 2016-02-18 DIAGNOSIS — H6122 Impacted cerumen, left ear: Secondary | ICD-10-CM

## 2016-02-23 ENCOUNTER — Encounter (HOSPITAL_COMMUNITY): Payer: Self-pay | Admitting: *Deleted

## 2016-02-23 ENCOUNTER — Emergency Department (HOSPITAL_COMMUNITY)
Admission: EM | Admit: 2016-02-23 | Discharge: 2016-02-23 | Disposition: A | Discharge status: Home / Self Care | Payer: Medicaid Other | Attending: Emergency Medicine | Admitting: Emergency Medicine

## 2016-02-23 DIAGNOSIS — F329 Major depressive disorder, single episode, unspecified: Secondary | ICD-10-CM | POA: Insufficient documentation

## 2016-02-23 DIAGNOSIS — F17210 Nicotine dependence, cigarettes, uncomplicated: Secondary | ICD-10-CM | POA: Diagnosis not present

## 2016-02-23 DIAGNOSIS — E669 Obesity, unspecified: Secondary | ICD-10-CM | POA: Insufficient documentation

## 2016-02-23 DIAGNOSIS — Z7984 Long term (current) use of oral hypoglycemic drugs: Secondary | ICD-10-CM | POA: Insufficient documentation

## 2016-02-23 DIAGNOSIS — Z791 Long term (current) use of non-steroidal anti-inflammatories (NSAID): Secondary | ICD-10-CM | POA: Insufficient documentation

## 2016-02-23 DIAGNOSIS — E119 Type 2 diabetes mellitus without complications: Secondary | ICD-10-CM | POA: Diagnosis not present

## 2016-02-23 DIAGNOSIS — Z79899 Other long term (current) drug therapy: Secondary | ICD-10-CM | POA: Diagnosis not present

## 2016-02-23 DIAGNOSIS — N6452 Nipple discharge: Secondary | ICD-10-CM | POA: Diagnosis present

## 2016-02-23 MED ORDER — KETOROLAC TROMETHAMINE 60 MG/2ML IM SOLN
60.0000 mg | Freq: Once | INTRAMUSCULAR | Status: AC
  Administered 2016-02-23: 60 mg via INTRAMUSCULAR
  Filled 2016-02-23: qty 2

## 2016-02-23 MED ORDER — SULFAMETHOXAZOLE-TRIMETHOPRIM 800-160 MG PO TABS
1.0000 | ORAL_TABLET | Freq: Once | ORAL | Status: AC
  Administered 2016-02-23: 1 via ORAL
  Filled 2016-02-23: qty 1

## 2016-02-23 MED ORDER — SULFAMETHOXAZOLE-TRIMETHOPRIM 800-160 MG PO TABS: 1.0000 | ORAL_TABLET | Freq: Two times a day (BID) | ORAL | Status: DC

## 2016-02-23 MED ORDER — TRAMADOL HCL 50 MG PO TABS: 100.0000 mg | ORAL_TABLET | Freq: Four times a day (QID) | ORAL | Status: DC | PRN

## 2016-02-23 NOTE — ED Notes (Signed)
Pt reports left pain, & drainage. Pt has had surgery in November. Tonight pt felt a know & it began to drain.

## 2016-02-23 NOTE — Discharge Instructions (Signed)
Use heat on the area. Continue to squeeze the fluid that is collecting out. Take antibiotic until gone. Take the tramadol with acetaminophen 1000 mg plus ibuprofen 600 mg 4 times a day for pain. Please follow-up with Dr. Towanda Malkin if it is not improving over the next 24 hours. Return to the ED if you get fever.

## 2016-02-23 NOTE — ED Provider Notes (Signed)
CSN: [id number]     Arrival date & time 02/23/16  0224 History   First MD Initiated Contact with Patient 02/23/16 [phone]     Chief Complaint  Patient presents with  . Abscess     (Consider location/radiation/quality/duration/timing/severity/associated sxs/prior Treatment) HPI patient reports last year she had a cyst removed by Dr. Arnoldo Morale however she continued to have leakage from her nipple. She was seen by Dr. Towanda Malkin, plastic surgeon in November and had some scar tissue and revision done however she continues to have leakage from her nipple. She states she needs to have a breast reduction however her insurance will not pay for it. She states that today she had a hard knot that is throbbing and has a pressure sensation in his painful underneath her left nipple. She states she's having more than normal drainage from her nipple that she describes as blood and pus. She also states she had some drainage from the suture line tonight. She states about a week ago she saw Dr. Towanda Malkin and he used a silver nitrate stick to the area in her suture line to cauterize it. However it did open up slightly tonight. She denies any fever or chills. She had nausea yesterday but none today. She states a hard area underneath her nipple is new.   PCP [place] Surgeon Dr Towanda Malkin  Past Medical History  Diagnosis Date  . Obesity   . Anxiety   . Depression   . Sinus headache   . Heartburn 09/01/2015    no current med.  . Non-insulin dependent type 2 diabetes mellitus ([place])   . Scar of breast 08/2015    left  . History of anemia 01/2014   Past Surgical History  Procedure Laterality Date  . Laparoscopic ovarian cystectomy Right 02/05/2014    Procedure: LAPAROSCOPIC right retroperitoneal (NOT OVARIAN) CYSTECTOMY;  Surgeon: Florian Buff, MD;  Location: [facility];  Service: Gynecology;  Laterality: Right;  . Incision and drainage abscess Left 10/07/2014    Procedure: INCISION AND DRAINAGE LEFT BREAST ABSCESS;   Surgeon: Jamesetta So, MD;  Location: [facility];  Service: General;  Laterality: Left;  Marland Kitchen Tympanostomy tube placement Bilateral 10/2014  . Mass excision Left 09/07/2015    Procedure: Minor SCAR EXCISION LEFT BREAST, PLASTIC CLOSURE;  Surgeon: Cristine Polio, MD;  Location: [facility];  Service: Plastics;  Laterality: Left;  . Breast surgery     Family History  Problem Relation Age of Onset  . Diabetes Father   . Hypertension Father   . Heart disease Daughter     heart murmur  . Cancer Maternal Grandmother     ovarian cancer  . Obesity Paternal Grandmother   . Diabetes Paternal Grandfather    Social History  Substance Use Topics  . Smoking status: Current Every Day Smoker -- 1.50 packs/day for 7 years    Types: Cigarettes  . Smokeless tobacco: Never Used  . Alcohol Use: Yes     Comment: occasionally   Smokes 1/2 -1 ppd  OB History    Gravida Para Term Preterm AB TAB SAB Ectopic Multiple Living   1 1 1       1      Review of Systems  All other systems reviewed and are negative.     Allergies  Review of patient's allergies indicates no known allergies.  Home Medications   Prior to Admission medications   Medication Sig Start Date End Date Taking? Authorizing Provider  acetaminophen (TYLENOL) 325 MG tablet  Take 650 mg by mouth every 6 (six) hours as needed.   Yes Historical Provider, MD  ALPRAZolam Duanne Moron) 1 MG tablet Take 1 tablet (1 mg total) by mouth 3 (three) times daily. 01/19/16  Yes Florian Buff, MD  ibuprofen (ADVIL,MOTRIN) 200 MG tablet Take 200 mg by mouth every 6 (six) hours as needed.   Yes Historical Provider, MD  metFORMIN (GLUCOPHAGE-XR) 500 MG 24 hr tablet TK 1 T PO QD 06/08/15  Yes Historical Provider, MD  venlafaxine XR (EFFEXOR-XR) 150 MG 24 hr capsule TAKE 2 CAPSULES BY MOUTH EVERY DAY 06/09/15  Yes Florian Buff, MD  sulfamethoxazole-trimethoprim (BACTRIM DS,SEPTRA DS) 800-160 MG tablet Take 1 tablet by mouth 2 (two) times daily. 02/23/16    Rolland Porter, MD  traMADol (ULTRAM) 50 MG tablet Take 2 tablets (100 mg total) by mouth every 6 (six) hours as needed. 02/23/16   Rolland Porter, MD  venlafaxine XR (EFFEXOR-XR) 150 MG 24 hr capsule TAKE 2 CAPSULES BY MOUTH EVERY DAY 11/01/15   Florian Buff, MD   BP 113/56 mmHg  Pulse 91  Temp(Src) 98.2 F (36.8 C) (Oral)  Resp 20  Ht 5\' 7"  (1.702 m)  Wt 398 lb (180.532 kg)  BMI 62.32 kg/m2  SpO2 99%  LMP 01/20/2016  Vital signs normal   Physical Exam  Constitutional: She is oriented to person, place, and time. She appears well-developed and well-nourished.  Non-toxic appearance. She does not appear ill. No distress.  Morbidly obese  HENT:  Head: Normocephalic and atraumatic.  Right Ear: External ear normal.  Left Ear: External ear normal.  Nose: Nose normal. No mucosal edema or rhinorrhea.  Mouth/Throat: Mucous membranes are normal. No dental abscesses or uvula swelling.  Eyes: Conjunctivae and EOM are normal.  Neck: Normal range of motion and full passive range of motion without pain.  Pulmonary/Chest: Effort normal. No respiratory distress. She has no rhonchi. She exhibits no crepitus.  Patient is felt to have a firm mass underneath her left nipple that measures 2 x 2 centimeters. When she presses on it there is drainage from her nipple. It is thick white and watery white in character. After she presses on it the area does get smaller. She has a well-healed surgical incision and there is no drainage from that area currently.  Abdominal: Soft. Normal appearance and bowel sounds are normal. She exhibits no distension.  Musculoskeletal: Normal range of motion.  Moves all extremities well.   Neurological: She is alert and oriented to person, place, and time. She has normal strength. No cranial nerve deficit.  Skin: Skin is warm, dry and intact. No rash noted. No erythema. No pallor.  Psychiatric: She has a normal mood and affect. Her speech is normal and behavior is normal. Her mood appears  not anxious.  Nursing note and vitals reviewed.   ED Course  Procedures (including critical care time) Medications  ketorolac (TORADOL) injection 60 mg (60 mg Intramuscular Given 02/23/16 0443)  sulfamethoxazole-trimethoprim (BACTRIM DS,SEPTRA DS) 800-160 MG per tablet 1 tablet (1 tablet Oral Given 02/23/16 0443)     Patient states her CBG was 131 prior to coming to the ED tonight. Patient was started on oral Septra. She is given Toradol for pain. Patient states her next appointment with Dr. Neita Goodnight in 6 weeks. She's advised to call the office and see she needs to be seen sooner. She states she cannot go back to see Dr. Arnoldo Morale.    MDM   Final diagnoses:  Nipple  discharge in female    New Prescriptions   SULFAMETHOXAZOLE-TRIMETHOPRIM (BACTRIM DS,SEPTRA DS) 800-160 MG TABLET    Take 1 tablet by mouth 2 (two) times daily.   TRAMADOL (ULTRAM) 50 MG TABLET    Take 2 tablets (100 mg total) by mouth every 6 (six) hours as needed.    Plan discharge  Rolland Porter, MD, Barbette Or, MD 02/23/16 [phone]

## 2016-03-10 ENCOUNTER — Other Ambulatory Visit (HOSPITAL_COMMUNITY): Payer: Self-pay | Admitting: Family Medicine

## 2016-03-10 DIAGNOSIS — N6452 Nipple discharge: Secondary | ICD-10-CM

## 2016-03-10 DIAGNOSIS — N63 Unspecified lump in unspecified breast: Secondary | ICD-10-CM

## 2016-03-15 ENCOUNTER — Ambulatory Visit (HOSPITAL_COMMUNITY)
Admission: RE | Admit: 2016-03-15 | Discharge: 2016-03-15 | Disposition: A | Discharge status: Home / Self Care | Payer: Medicaid Other | Source: Ambulatory Visit | Attending: Family Medicine | Admitting: Family Medicine

## 2016-03-15 ENCOUNTER — Other Ambulatory Visit (HOSPITAL_COMMUNITY): Payer: Self-pay | Admitting: Family Medicine

## 2016-03-15 DIAGNOSIS — N6452 Nipple discharge: Secondary | ICD-10-CM

## 2016-03-15 DIAGNOSIS — N63 Unspecified lump in unspecified breast: Secondary | ICD-10-CM

## 2016-03-23 ENCOUNTER — Other Ambulatory Visit (HOSPITAL_COMMUNITY): Payer: Self-pay | Admitting: Family Medicine

## 2016-03-23 DIAGNOSIS — N63 Unspecified lump in unspecified breast: Secondary | ICD-10-CM

## 2016-03-23 DIAGNOSIS — N6452 Nipple discharge: Secondary | ICD-10-CM

## 2016-03-31 ENCOUNTER — Ambulatory Visit (INDEPENDENT_AMBULATORY_CARE_PROVIDER_SITE_OTHER): Payer: Medicaid Other | Admitting: Otolaryngology

## 2016-04-05 ENCOUNTER — Other Ambulatory Visit (HOSPITAL_COMMUNITY): Payer: Self-pay | Admitting: Family Medicine

## 2016-04-05 ENCOUNTER — Ambulatory Visit (HOSPITAL_COMMUNITY)
Admission: RE | Admit: 2016-04-05 | Discharge: 2016-04-05 | Disposition: A | Discharge status: Home / Self Care | Payer: Medicaid Other | Source: Ambulatory Visit | Attending: Family Medicine | Admitting: Family Medicine

## 2016-04-05 ENCOUNTER — Ambulatory Visit (HOSPITAL_COMMUNITY): Admission: RE | Admit: 2016-04-05 | Payer: Medicaid Other | Source: Ambulatory Visit

## 2016-04-05 DIAGNOSIS — N6320 Unspecified lump in the left breast, unspecified quadrant: Secondary | ICD-10-CM

## 2016-04-05 DIAGNOSIS — N611 Abscess of the breast and nipple: Secondary | ICD-10-CM | POA: Insufficient documentation

## 2016-04-05 DIAGNOSIS — N6452 Nipple discharge: Secondary | ICD-10-CM

## 2016-04-05 DIAGNOSIS — N63 Unspecified lump in unspecified breast: Secondary | ICD-10-CM

## 2016-04-13 ENCOUNTER — Encounter (HOSPITAL_COMMUNITY): Payer: Self-pay

## 2016-04-13 DIAGNOSIS — G43909 Migraine, unspecified, not intractable, without status migrainosus: Secondary | ICD-10-CM | POA: Insufficient documentation

## 2016-04-13 DIAGNOSIS — F329 Major depressive disorder, single episode, unspecified: Secondary | ICD-10-CM | POA: Diagnosis not present

## 2016-04-13 DIAGNOSIS — Z7984 Long term (current) use of oral hypoglycemic drugs: Secondary | ICD-10-CM | POA: Diagnosis not present

## 2016-04-13 DIAGNOSIS — R51 Headache: Secondary | ICD-10-CM | POA: Diagnosis present

## 2016-04-13 DIAGNOSIS — E119 Type 2 diabetes mellitus without complications: Secondary | ICD-10-CM | POA: Insufficient documentation

## 2016-04-13 DIAGNOSIS — E669 Obesity, unspecified: Secondary | ICD-10-CM | POA: Insufficient documentation

## 2016-04-13 DIAGNOSIS — Z79899 Other long term (current) drug therapy: Secondary | ICD-10-CM | POA: Insufficient documentation

## 2016-04-13 DIAGNOSIS — Z6841 Body Mass Index (BMI) 40.0 and over, adult: Secondary | ICD-10-CM | POA: Insufficient documentation

## 2016-04-13 NOTE — ED Notes (Signed)
Left sided headache started at 1450. Patient states a pressure feeling with sensitivity to noise.

## 2016-04-14 ENCOUNTER — Emergency Department (HOSPITAL_COMMUNITY)
Admission: EM | Admit: 2016-04-14 | Discharge: 2016-04-14 | Disposition: A | Discharge status: Home / Self Care | Payer: Medicaid Other | Attending: Emergency Medicine | Admitting: Emergency Medicine

## 2016-04-14 DIAGNOSIS — G43009 Migraine without aura, not intractable, without status migrainosus: Secondary | ICD-10-CM

## 2016-04-14 LAB — COMPREHENSIVE METABOLIC PANEL
ALBUMIN: 3.5 g/dL (ref 3.5–5.0)
ALK PHOS: 104 U/L (ref 38–126)
ALT: 19 U/L (ref 14–54)
AST: 14 U/L — AB (ref 15–41)
Anion gap: 7 (ref 5–15)
BILIRUBIN TOTAL: 0.3 mg/dL (ref 0.3–1.2)
BUN: 9 mg/dL (ref 6–20)
CHLORIDE: 104 mmol/L (ref 101–111)
CO2: 27 mmol/L (ref 22–32)
CREATININE: 0.88 mg/dL (ref 0.44–1.00)
Calcium: 8.8 mg/dL — ABNORMAL LOW (ref 8.9–10.3)
GFR calc Af Amer: >60 mL/min (ref >60)
GLUCOSE: 152 mg/dL — AB (ref 65–99)
POTASSIUM: 3.6 mmol/L (ref 3.5–5.1)
Sodium: 138 mmol/L (ref 135–145)
Total Protein: 7.8 g/dL (ref 6.5–8.1)

## 2016-04-14 LAB — CBC
HEMATOCRIT: 36 % (ref 36.0–46.0)
HEMOGLOBIN: 11.6 g/dL — AB (ref 12.0–15.0)
MCH: 27.5 pg (ref 26.0–34.0)
MCHC: 32.2 g/dL (ref 30.0–36.0)
MCV: 85.3 fL (ref 78.0–100.0)
Platelets: 198 10*3/uL (ref 150–400)
RBC: 4.22 MIL/uL (ref 3.87–5.11)
RDW: 14.9 % (ref 11.5–15.5)
WBC: 17.6 10*3/uL — AB (ref 4.0–10.5)

## 2016-04-14 LAB — URINALYSIS, ROUTINE W REFLEX MICROSCOPIC
BILIRUBIN URINE: NEGATIVE
GLUCOSE, UA: NEGATIVE mg/dL
HGB URINE DIPSTICK: NEGATIVE
Nitrite: NEGATIVE
PH: 7 (ref 5.0–8.0)
Protein, ur: 100 mg/dL — AB
SPECIFIC GRAVITY, URINE: 1.02 (ref 1.005–1.030)

## 2016-04-14 LAB — URINE MICROSCOPIC-ADD ON

## 2016-04-14 LAB — LITHIUM LEVEL: LITHIUM LVL: 0.12 mmol/L — AB (ref 0.60–1.20)

## 2016-04-14 MED ORDER — SODIUM CHLORIDE 0.9 % IV BOLUS (SEPSIS)
1000.0000 mL | Freq: Once | INTRAVENOUS | Status: AC
  Administered 2016-04-14: 1000 mL via INTRAVENOUS

## 2016-04-14 MED ORDER — MAGNESIUM SULFATE 2 GM/50ML IV SOLN
2.0000 g | Freq: Once | INTRAVENOUS | Status: DC
  Filled 2016-04-14: qty 50

## 2016-04-14 MED ORDER — DIPHENHYDRAMINE HCL 50 MG/ML IJ SOLN
25.0000 mg | Freq: Once | INTRAMUSCULAR | Status: AC
  Administered 2016-04-14: 25 mg via INTRAVENOUS
  Filled 2016-04-14: qty 1

## 2016-04-14 MED ORDER — METOCLOPRAMIDE HCL 5 MG/ML IJ SOLN
10.0000 mg | Freq: Once | INTRAMUSCULAR | Status: AC
  Administered 2016-04-14: 10 mg via INTRAVENOUS
  Filled 2016-04-14: qty 2

## 2016-04-14 MED ORDER — DEXAMETHASONE SODIUM PHOSPHATE 10 MG/ML IJ SOLN
10.0000 mg | Freq: Once | INTRAMUSCULAR | Status: DC
Start: 2016-04-14 — End: 2016-04-14
  Filled 2016-04-14: qty 1

## 2016-04-14 MED ORDER — KETOROLAC TROMETHAMINE 30 MG/ML IJ SOLN
30.0000 mg | Freq: Once | INTRAMUSCULAR | Status: AC
  Administered 2016-04-14: 30 mg via INTRAVENOUS
  Filled 2016-04-14: qty 1

## 2016-04-14 NOTE — ED Notes (Cosign Needed)
Upon entering room to administer new medication orders, pt questioned what medicine she was getting. This nurse informed pt she was getting Decadron and magnesium. Pt stated she was ready to go home as her headache had resolved. Pt mother responded, "well you did tell that Dr your head was still hurting when she came in the room a few minutes ago."  The pt states, "well I was drunk from that cocktail for my headache."  The pt then looks at this nurse and states, "Go find my doctor and tell her I'm better and ready to go."  Dr Tomi Bamberger informed of situation.

## 2016-04-14 NOTE — ED Provider Notes (Signed)
CSN: [id number]     Arrival date & time 04/13/16  2240 History  By signing my name below, I, Ephriam Jenkins, attest that this documentation has been prepared under the direction and in the presence of Rolland Porter, MD at 02:44 AM.  Electronically signed, Ephriam Jenkins, ED Scribe. 04/14/2016. 4:14 AM.    Chief Complaint  Patient presents with  . Headache   The history is provided by the patient. No language interpreter was used.   HPI Comments: Juliyah Verble is a 30 y.o. female with a PMHx of obesity, anxiety, DM, who presents to the Emergency Department complaining of gradual onset throbbing, sharp, dull, achinig headache that started 12 hours ago that hasn't gotten progressively worse. Pt states she was sitting outside on the porch when the headache began. Pt reports pain behind her left eye, left temple and forehead.  Pt also reports a visual disturbance during the onset described as "her eyes adjusting walking into a room from outside" but denies seeing any bright or dark vision or blurred vision. Pt states she has had some anxiety recently and states "I over-think things and get upset".  Has been upset recently. Pt reports she started taking Lithium one month ago and states that it was been helping. Pt reports she is a current smoker, 1 pack per day. Pt denies any Hx of similar headaches. Pt further denies any nausea, vomiting, increased urinary frequency, numbness or tingling in extremities, cough,sore throat, rhinorrhea. FH + for sinus headaches  PCP PA Luciana Axe  Past Medical History  Diagnosis Date  . Obesity   . Anxiety   . Depression   . Sinus headache   . Heartburn 09/01/2015    no current med.  . Non-insulin dependent type 2 diabetes mellitus ([place])   . Scar of breast 08/2015    left  . History of anemia 01/2014   Past Surgical History  Procedure Laterality Date  . Laparoscopic ovarian cystectomy Right 02/05/2014    Procedure: LAPAROSCOPIC right retroperitoneal (NOT OVARIAN) CYSTECTOMY;   Surgeon: Florian Buff, MD;  Location: [facility];  Service: Gynecology;  Laterality: Right;  . Incision and drainage abscess Left 10/07/2014    Procedure: INCISION AND DRAINAGE LEFT BREAST ABSCESS;  Surgeon: Jamesetta So, MD;  Location: [facility];  Service: General;  Laterality: Left;  Marland Kitchen Tympanostomy tube placement Bilateral 10/2014  . Mass excision Left 09/07/2015    Procedure: Minor SCAR EXCISION LEFT BREAST, PLASTIC CLOSURE;  Surgeon: Cristine Polio, MD;  Location: [facility];  Service: Plastics;  Laterality: Left;  . Breast surgery     Family History  Problem Relation Age of Onset  . Diabetes Father   . Hypertension Father   . Heart disease Daughter     heart murmur  . Cancer Maternal Grandmother     ovarian cancer  . Obesity Paternal Grandmother   . Diabetes Paternal Grandfather    Social History  Substance Use Topics  . Smoking status: Current Every Day Smoker -- 1.50 packs/day for 7 years    Types: Cigarettes  . Smokeless tobacco: Never Used  . Alcohol Use: Yes     Comment: occasionally  smokes 1 ppd Lives with spouse unemployed  OB History    Gravida Para Term Preterm AB TAB SAB Ectopic Multiple Living   1 1 1       1      Review of Systems  HENT: Negative for rhinorrhea.   Respiratory: Negative for cough.   Gastrointestinal: Negative  for nausea and vomiting.  Genitourinary: Negative for frequency.  Neurological: Positive for headaches. Negative for numbness.  All other systems reviewed and are negative.     Allergies  Review of patient's allergies indicates no known allergies.  Home Medications  lithium Prior to Admission medications   Medication Sig Start Date End Date Taking? Authorizing Provider  acetaminophen (TYLENOL) 325 MG tablet Take 650 mg by mouth every 6 (six) hours as needed.    Historical Provider, MD  ALPRAZolam Duanne Moron) 1 MG tablet Take 1 tablet (1 mg total) by mouth 3 (three) times daily. 01/19/16   Florian Buff, MD  ibuprofen  (ADVIL,MOTRIN) 200 MG tablet Take 200 mg by mouth every 6 (six) hours as needed.    Historical Provider, MD  metFORMIN (GLUCOPHAGE-XR) 500 MG 24 hr tablet TK 1 T PO QD 06/08/15   Historical Provider, MD  sulfamethoxazole-trimethoprim (BACTRIM DS,SEPTRA DS) 800-160 MG tablet Take 1 tablet by mouth 2 (two) times daily. 02/23/16   Rolland Porter, MD  traMADol (ULTRAM) 50 MG tablet Take 2 tablets (100 mg total) by mouth every 6 (six) hours as needed. 02/23/16   Rolland Porter, MD  venlafaxine XR (EFFEXOR-XR) 150 MG 24 hr capsule TAKE 2 CAPSULES BY MOUTH EVERY DAY 06/09/15   Florian Buff, MD  venlafaxine XR (EFFEXOR-XR) 150 MG 24 hr capsule TAKE 2 CAPSULES BY MOUTH EVERY DAY 11/01/15   Florian Buff, MD   BP 128/71 mmHg  Pulse 85  Temp(Src) 98.2 F (36.8 C) (Temporal)  Resp 20  Ht 5' 7.5" (1.715 m)  Wt 380 lb (172.367 kg)  BMI 58.60 kg/m2  SpO2 95%  LMP 03/22/2016  Vital signs normal   Physical Exam  Constitutional: She is oriented to person, place, and time. She appears well-developed and well-nourished.  Non-toxic appearance. She does not appear ill. No distress.  HENT:  Head: Normocephalic and atraumatic.  Right Ear: External ear normal.  Left Ear: External ear normal.  Nose: Nose normal. No mucosal edema or rhinorrhea.  Mouth/Throat: Oropharynx is clear and moist and mucous membranes are normal. No dental abscesses or uvula swelling.  Eyes: Conjunctivae and EOM are normal. Pupils are equal, round, and reactive to light.  Neck: Normal range of motion and full passive range of motion without pain. Neck supple.  Cardiovascular: Normal rate, regular rhythm and normal heart sounds.  Exam reveals no gallop and no friction rub.   No murmur heard. Pulmonary/Chest: Effort normal and breath sounds normal. No respiratory distress. She has no wheezes. She has no rhonchi. She has no rales. She exhibits no tenderness and no crepitus.  Abdominal: Soft. Normal appearance and bowel sounds are normal. She exhibits no  distension. There is no tenderness. There is no rebound and no guarding.  Musculoskeletal: Normal range of motion. She exhibits no edema or tenderness.  Moves all extremities well.   Neurological: She is alert and oriented to person, place, and time. She has normal strength. No cranial nerve deficit.  Skin: Skin is warm, dry and intact. No rash noted. No erythema. No pallor.  Psychiatric: She has a normal mood and affect. Her speech is normal and behavior is normal. Her mood appears not anxious.  Nursing note and vitals reviewed.   ED Course  Procedures   Medications  dexamethasone (DECADRON) injection 10 mg (10 mg Intravenous Not Given 04/14/16 0504)  magnesium sulfate IVPB 2 g 50 mL (2 g Intravenous Not Given 04/14/16 0505)  sodium chloride 0.9 % bolus 1,000 mL (  0 mLs Intravenous Stopped 04/14/16 0415)  metoCLOPramide (REGLAN) injection 10 mg (10 mg Intravenous Given 04/14/16 0329)  diphenhydrAMINE (BENADRYL) injection 25 mg (25 mg Intravenous Given 04/14/16 0329)  ketorolac (TORADOL) 30 MG/ML injection 30 mg (30 mg Intravenous Given 04/14/16 0329)    .DIAGNOSTIC STUDIES: Oxygen Saturation is 95% on RA, normal by my interpretation.  COORDINATION OF CARE: 2:52 AM-Will order migraine cocktail. Discussed treatment plan with pt at bedside and pt agreed to plan.  Recheck 04:15 AM, pt states her headache is better, but still needs more meds. IV magnesium and decadron ordered.  05:15 AM nurse reports patient is feeling better and doesn't want the other medications and is ready to be dischaged.    Labs Review Results for orders placed or performed during the hospital encounter of 04/14/16  CBC  Result Value Ref Range   WBC 17.6 (H) 4.0 - 10.5 K/uL   RBC 4.22 3.87 - 5.11 MIL/uL   Hemoglobin 11.6 (L) 12.0 - 15.0 g/dL   HCT 36.0 36.0 - 46.0 %   MCV 85.3 78.0 - 100.0 fL   MCH 27.5 26.0 - 34.0 pg   MCHC 32.2 30.0 - 36.0 g/dL   RDW 14.9 11.5 - 15.5 %   Platelets 198 150 - 400 K/uL   Comprehensive metabolic panel  Result Value Ref Range   Sodium 138 135 - 145 mmol/L   Potassium 3.6 3.5 - 5.1 mmol/L   Chloride 104 101 - 111 mmol/L   CO2 27 22 - 32 mmol/L   Glucose, Bld 152 (H) 65 - 99 mg/dL   BUN 9 6 - 20 mg/dL   Creatinine, Ser 0.88 0.44 - 1.00 mg/dL   Calcium 8.8 (L) 8.9 - 10.3 mg/dL   Total Protein 7.8 6.5 - 8.1 g/dL   Albumin 3.5 3.5 - 5.0 g/dL   AST 14 (L) 15 - 41 U/L   ALT 19 14 - 54 U/L   Alkaline Phosphatase 104 38 - 126 U/L   Total Bilirubin 0.3 0.3 - 1.2 mg/dL   GFR calc non Af Amer >60 >60 mL/min   GFR calc Af Amer >60 >60 mL/min   Anion gap 7 5 - 15  Urinalysis, Routine w reflex microscopic (not at [facility]  Result Value Ref Range   Color, Urine YELLOW YELLOW   APPearance HAZY (A) CLEAR   Specific Gravity, Urine 1.020 1.005 - 1.030   pH 7.0 5.0 - 8.0   Glucose, UA NEGATIVE NEGATIVE mg/dL   Hgb urine dipstick NEGATIVE NEGATIVE   Bilirubin Urine NEGATIVE NEGATIVE   Ketones, ur TRACE (A) NEGATIVE mg/dL   Protein, ur 100 (A) NEGATIVE mg/dL   Nitrite NEGATIVE NEGATIVE   Leukocytes, UA SMALL (A) NEGATIVE  Urine microscopic-add on  Result Value Ref Range   Squamous Epithelial / LPF TOO NUMEROUS TO COUNT (A) NONE SEEN   WBC, UA TOO NUMEROUS TO COUNT 0 - 5 WBC/hpf   RBC / HPF 0-5 0 - 5 RBC/hpf   Bacteria, UA MANY (A) NONE SEEN  Lithium level  Result Value Ref Range   Lithium Lvl 0.12 (L) 0.60 - 1.20 mmol/L    Laboratory interpretation all normal except contaminated urinalysis, subtherapeutic lithium level, leukocytosis  I have personally reviewed and evaluated these images and lab results as part of my medical decision-making.    MDM   Final diagnoses:  Migraine without aura and without status migrainosus, not intractable   Plan discharge  Rolland Porter, MD, FACEP   I personally performed  the services described in this documentation, which was scribed in my presence. The recorded information has been reviewed and considered.  Vital signs  normal     Rolland Porter, MD 04/14/16 [phone]

## 2016-04-14 NOTE — ED Notes (Signed)
MD at bedside. 

## 2016-04-14 NOTE — Discharge Instructions (Signed)
Go home and rest. Drink plenty of fluids. Recheck if you get worse.     Migraine Headache A migraine headache is an intense, throbbing pain on one or both sides of your head. A migraine can last for 30 minutes to several hours. CAUSES  The exact cause of a migraine headache is not always known. However, a migraine may be caused when nerves in the brain become irritated and release chemicals that cause inflammation. This causes pain. Certain things may also trigger migraines, such as:  Alcohol.  Smoking.  Stress.  Menstruation.  Aged cheeses.  Foods or drinks that contain nitrates, glutamate, aspartame, or tyramine.  Lack of sleep.  Chocolate.  Caffeine.  Hunger.  Physical exertion.  Fatigue.  Medicines used to treat chest pain (nitroglycerine), birth control pills, estrogen, and some blood pressure medicines. SIGNS AND SYMPTOMS  Pain on one or both sides of your head.  Pulsating or throbbing pain.  Severe pain that prevents daily activities.  Pain that is aggravated by any physical activity.  Nausea, vomiting, or both.  Dizziness.  Pain with exposure to bright lights, loud noises, or activity.  General sensitivity to bright lights, loud noises, or smells. Before you get a migraine, you may get warning signs that a migraine is coming (aura). An aura may include:  Seeing flashing lights.  Seeing bright spots, halos, or zigzag lines.  Having tunnel vision or blurred vision.  Having feelings of numbness or tingling.  Having trouble talking.  Having muscle weakness. DIAGNOSIS  A migraine headache is often diagnosed based on:  Symptoms.  Physical exam.  A CT scan or MRI of your head. These imaging tests cannot diagnose migraines, but they can help rule out other causes of headaches. TREATMENT Medicines may be given for pain and nausea. Medicines can also be given to help prevent recurrent migraines.  HOME CARE INSTRUCTIONS  Only take  over-the-counter or prescription medicines for pain or discomfort as directed by your health care provider. The use of long-term narcotics is not recommended.  Lie down in a dark, quiet room when you have a migraine.  Keep a journal to find out what may trigger your migraine headaches. For example, write down:  What you eat and drink.  How much sleep you get.  Any change to your diet or medicines.  Limit alcohol consumption.  Quit smoking if you smoke.  Get 7-9 hours of sleep, or as recommended by your health care provider.  Limit stress.  Keep lights dim if bright lights bother you and make your migraines worse. SEEK IMMEDIATE MEDICAL CARE IF:   Your migraine becomes severe.  You have a fever.  You have a stiff neck.  You have vision loss.  You have muscular weakness or loss of muscle control.  You start losing your balance or have trouble walking.  You feel faint or pass out.  You have severe symptoms that are different from your first symptoms. MAKE SURE YOU:   Understand these instructions.  Will watch your condition.  Will get help right away if you are not doing well or get worse.   This information is not intended to replace advice given to you by your health care provider. Make sure you discuss any questions you have with your health care provider.   Document Released: 10/03/2005 Document Revised: 10/24/2014 Document Reviewed: 06/10/2013 Elsevier Interactive Patient Education [employer].

## 2016-04-24 ENCOUNTER — Other Ambulatory Visit: Payer: Self-pay | Admitting: Obstetrics & Gynecology

## 2016-04-26 ENCOUNTER — Telehealth: Payer: Self-pay | Admitting: *Deleted

## 2016-04-26 NOTE — Telephone Encounter (Signed)
Pharmacist from [facility] called. Dr. Elonda Husky sent prescription over for Xanax BID. Pt has been taking it TID. Pt picked up prescription 04/24/16. I spoke with Dr. Elonda Husky and he advised ok for TID #90 and 5 refills. Pharmacy aware. [place]

## 2016-05-16 ENCOUNTER — Other Ambulatory Visit: Payer: Self-pay | Admitting: Obstetrics & Gynecology

## 2016-05-24 DIAGNOSIS — N61 Mastitis without abscess: Secondary | ICD-10-CM | POA: Insufficient documentation

## 2016-06-10 DIAGNOSIS — Z720 Tobacco use: Secondary | ICD-10-CM | POA: Insufficient documentation

## 2016-06-26 ENCOUNTER — Emergency Department (HOSPITAL_COMMUNITY)
Admission: EM | Admit: 2016-06-26 | Discharge: 2016-06-27 | Disposition: A | Discharge status: Home / Self Care | Payer: Medicaid Other | Attending: Emergency Medicine | Admitting: Emergency Medicine

## 2016-06-26 ENCOUNTER — Encounter (HOSPITAL_COMMUNITY): Payer: Self-pay | Admitting: Emergency Medicine

## 2016-06-26 DIAGNOSIS — R739 Hyperglycemia, unspecified: Secondary | ICD-10-CM

## 2016-06-26 DIAGNOSIS — F17210 Nicotine dependence, cigarettes, uncomplicated: Secondary | ICD-10-CM | POA: Insufficient documentation

## 2016-06-26 DIAGNOSIS — Z79899 Other long term (current) drug therapy: Secondary | ICD-10-CM | POA: Insufficient documentation

## 2016-06-26 DIAGNOSIS — Z791 Long term (current) use of non-steroidal anti-inflammatories (NSAID): Secondary | ICD-10-CM | POA: Insufficient documentation

## 2016-06-26 DIAGNOSIS — G43009 Migraine without aura, not intractable, without status migrainosus: Secondary | ICD-10-CM

## 2016-06-26 DIAGNOSIS — Z7984 Long term (current) use of oral hypoglycemic drugs: Secondary | ICD-10-CM | POA: Insufficient documentation

## 2016-06-26 DIAGNOSIS — G43909 Migraine, unspecified, not intractable, without status migrainosus: Secondary | ICD-10-CM | POA: Insufficient documentation

## 2016-06-26 DIAGNOSIS — E1165 Type 2 diabetes mellitus with hyperglycemia: Secondary | ICD-10-CM | POA: Insufficient documentation

## 2016-06-26 LAB — BASIC METABOLIC PANEL
Anion gap: 11 (ref 5–15)
BUN: 10 mg/dL (ref 6–20)
CALCIUM: 9.4 mg/dL (ref 8.9–10.3)
CHLORIDE: 95 mmol/L — AB (ref 101–111)
CO2: 25 mmol/L (ref 22–32)
CREATININE: 0.99 mg/dL (ref 0.44–1.00)
Glucose, Bld: 425 mg/dL — ABNORMAL HIGH (ref 65–99)
Potassium: 3.5 mmol/L (ref 3.5–5.1)
SODIUM: 131 mmol/L — AB (ref 135–145)

## 2016-06-26 LAB — CBC
HCT: 36.2 % (ref 36.0–46.0)
Hemoglobin: 12.2 g/dL (ref 12.0–15.0)
MCH: 28.1 pg (ref 26.0–34.0)
MCHC: 33.7 g/dL (ref 30.0–36.0)
MCV: 83.4 fL (ref 78.0–100.0)
PLATELETS: 319 10*3/uL (ref 150–400)
RBC: 4.34 MIL/uL (ref 3.87–5.11)
RDW: 13.9 % (ref 11.5–15.5)
WBC: 17.6 10*3/uL — AB (ref 4.0–10.5)

## 2016-06-26 LAB — CBG MONITORING, ED
GLUCOSE-CAPILLARY: 431 mg/dL — AB (ref 65–99)
GLUCOSE-CAPILLARY: 411 mg/dL — AB (ref 65–99)

## 2016-06-26 LAB — URINALYSIS, ROUTINE W REFLEX MICROSCOPIC
Bilirubin Urine: NEGATIVE
KETONES UR: NEGATIVE mg/dL
NITRITE: NEGATIVE
PROTEIN: 30 mg/dL — AB
Specific Gravity, Urine: 1.02 (ref 1.005–1.030)
pH: 6 (ref 5.0–8.0)

## 2016-06-26 LAB — URINE MICROSCOPIC-ADD ON

## 2016-06-26 MED ORDER — KETOROLAC TROMETHAMINE 30 MG/ML IJ SOLN
30.0000 mg | Freq: Once | INTRAMUSCULAR | Status: AC
  Administered 2016-06-26: 30 mg via INTRAVENOUS
  Filled 2016-06-26: qty 1

## 2016-06-26 MED ORDER — INSULIN ASPART 100 UNIT/ML ~~LOC~~ SOLN
10.0000 [IU] | Freq: Once | SUBCUTANEOUS | Status: AC
  Administered 2016-06-26: 10 [IU] via SUBCUTANEOUS
  Filled 2016-06-26: qty 1

## 2016-06-26 MED ORDER — SODIUM CHLORIDE 0.9 % IV BOLUS (SEPSIS)
1000.0000 mL | Freq: Once | INTRAVENOUS | Status: AC
  Administered 2016-06-26: 1000 mL via INTRAVENOUS

## 2016-06-26 MED ORDER — SODIUM CHLORIDE 0.9 % IV BOLUS (SEPSIS)
1000.0000 mL | Freq: Once | INTRAVENOUS | Status: AC
  Administered 2016-06-27: 1000 mL via INTRAVENOUS

## 2016-06-26 MED ORDER — KETOROLAC TROMETHAMINE 30 MG/ML IJ SOLN
INTRAMUSCULAR | Status: AC
  Filled 2016-06-26: qty 1

## 2016-06-26 MED ORDER — METOCLOPRAMIDE HCL 5 MG/ML IJ SOLN
10.0000 mg | Freq: Once | INTRAMUSCULAR | Status: AC
  Administered 2016-06-26: 10 mg via INTRAVENOUS
  Filled 2016-06-26: qty 2

## 2016-06-26 MED ORDER — DIPHENHYDRAMINE HCL 50 MG/ML IJ SOLN
50.0000 mg | Freq: Once | INTRAMUSCULAR | Status: AC
  Administered 2016-06-26: 50 mg via INTRAVENOUS
  Filled 2016-06-26: qty 1

## 2016-06-26 NOTE — ED Notes (Signed)
Pt states having high CBG readings at home for multiple days following her partial mastectomy. Polyuria and polydipsia stated, reports taking Metformin as prescribed.

## 2016-06-26 NOTE — ED Triage Notes (Signed)
Pt reports a blood sugar of over 600. Pt had L partial mastectomy on Monday. Pt also c/o "slight migraine."

## 2016-06-26 NOTE — ED Provider Notes (Signed)
By signing my name below, I, Judy Nelson, attest that this documentation has been prepared under the direction and in the presence of [place], DO. Electronically Signed: Reola Nelson, ED Scribe. 06/26/16. 11:44 PM.  TIME SEEN: 11:19 PM  CHIEF COMPLAINT:  Chief Complaint  Patient presents with  . Hyperglycemia   HPI Comments: Judy Nelson is a 30 y.o. female who is ~6 days s/p partial left mastectomy (performed by Judy Hotter, MD at [facility]), with a PMHx of DM2, who presents to the Emergency Department complaining of hyperglycemia onset s/p surgery. Pt reports that she has since her surgery that she has taken her CBG levels several times and they have all been running between 400-600 which is higher than her baseline. She notes associated polydipsia secondary to this issue. Pt is rx'd 1000mg  Metformin q.d. for her DM, but reports that she has only been taking half a dose daily secondary to side effects. Pt reports that after taking her Metformin today that her CBG initially lowered slightly, but has since elevated again. Denies fever, vomiting, diarrhea, or Purulent drainage from her mastectomy site. States she is changing the dressing and packing regularly. No bleeding.  Pt is additionally c/o a migraine HA. She has a hx of same in the past and has been seen in the ED for this problem. Pt has taken a migraine cocktail previously in the ED with complete relief of her pain. No recent head injury. Denies numbness, tingling, focal weakness. No sudden onset, thunderclap headache.  PCP: Judy Bal, PA-C, [facility]   ROS: See HPI Constitutional: no fever  Eyes: no drainage  ENT: no runny nose   Cardiovascular:  no chest pain  Resp: no SOB  GI: no vomiting GU: no dysuria Integumentary: no rash  Allergy: no hives  Musculoskeletal: no leg swelling  Neurological: no slurred speech ROS otherwise negative  PAST MEDICAL HISTORY/PAST SURGICAL HISTORY:   Past Medical History:  Diagnosis Date  . Anxiety   . Depression   . Heartburn 09/01/2015   no current med.  Marland Kitchen History of anemia 01/2014  . Non-insulin dependent type 2 diabetes mellitus ([place])   . Obesity   . Scar of breast 08/2015   left  . Sinus headache    MEDICATIONS:  Prior to Admission medications   Medication Sig Start Date End Date Taking? Authorizing Provider  acetaminophen (TYLENOL) 325 MG tablet Take 650 mg by mouth every 6 (six) hours as needed.    Historical Provider, MD  ALPRAZolam Judy Nelson) 1 MG tablet TAKE 1 TABLET BY MOUTH TWICE DAILY 04/26/16   Judy Buff, MD  ibuprofen (ADVIL,MOTRIN) 200 MG tablet Take 200 mg by mouth every 6 (six) hours as needed.    Historical Provider, MD  metFORMIN (GLUCOPHAGE-XR) 500 MG 24 hr tablet TK 1 T PO QD 06/08/15   Historical Provider, MD  sulfamethoxazole-trimethoprim (BACTRIM DS,SEPTRA DS) 800-160 MG tablet Take 1 tablet by mouth 2 (two) times daily. 02/23/16   Judy Porter, MD  traMADol (ULTRAM) 50 MG tablet Take 2 tablets (100 mg total) by mouth every 6 (six) hours as needed. 02/23/16   Judy Porter, MD  venlafaxine XR (EFFEXOR-XR) 150 MG 24 hr capsule TAKE 2 CAPSULES BY MOUTH EVERY DAY 06/09/15   Judy Buff, MD  venlafaxine XR (EFFEXOR-XR) 150 MG 24 hr capsule TAKE 2 CAPSULES BY MOUTH EVERY DAY 05/17/16   Judy Buff, MD   ALLERGIES:  No Known Allergies  SOCIAL HISTORY:  Social  History  Substance Use Topics  . Smoking status: Current Every Day Smoker    Packs/day: 0.50    Years: 7.00    Types: Cigarettes  . Smokeless tobacco: Never Used  . Alcohol use Yes     Comment: occasionally   FAMILY HISTORY: Family History  Problem Relation Age of Onset  . Diabetes Father   . Hypertension Father   . Heart disease Daughter     heart murmur  . Cancer Maternal Grandmother     ovarian cancer  . Obesity Paternal Grandmother   . Diabetes Paternal Grandfather    EXAM: BP 146/90   Pulse 102   Temp 98.7 F (37.1 C) (Oral)   Resp 18    Ht 5\' 8"  (1.727 m)   Wt (!) 380 lb (172.4 kg)   SpO2 98%   BMI 57.78 kg/m  CONSTITUTIONAL: Alert and oriented and responds appropriately to questions. Well-appearing; well-nourished; pt is obese   HEAD: Normocephalic EYES: Conjunctivae clear, PERRL ENT: normal nose; no rhinorrhea; moist mucous membranes NECK: Supple, no meningismus, no LAD  CARD: RRR; S1 and S2 appreciated; no murmurs, no clicks, no rubs, no gallops BREAST: Open wound to the left breast from recent partial mastectomy that is packed, with no surrounding erythema, warmth, bleeding, or purulent drainage from the area RESP: Normal chest excursion without splinting or tachypnea; breath sounds clear and equal bilaterally; no wheezes, no rhonchi, no rales, no hypoxia or respiratory distress, speaking full sentences ABD/GI: Normal bowel sounds; non-distended; soft, non-tender, no rebound, no guarding, no peritoneal signs BACK:  The back appears normal and is non-tender to palpation, there is no CVA tenderness EXT: Normal ROM in all joints; non-tender to palpation; no edema; normal capillary refill; no cyanosis, no calf tenderness or swelling    SKIN: Normal color for age and race; warm; no rash NEURO: Moves all extremities equally, sensation to light touch intact diffusely, cranial nerves II through XII intact PSYCH: The patient's mood and manner are appropriate. Grooming and personal hygiene are appropriate.  MEDICAL DECISION MAKING: Patient here with hyperglycemia. She is not in DKA. I suspect this is secondary to her recent surgery and she states she did miss a dose of metformin. She is supposed to be on 1000 mg once a day but is only taking 500 mg once a day. I suspect that she does not get enough medication. I recommended she go to 500 mg twice a day but she would like to discuss this further with her PCP. We'll give her insulin, IV fluids to get her blood sugar controlled. She is also complaining of a migraine headache. Similar to  her prior chronic headaches. No head injury, neurologic deficit, fever. Doubt intracranial hemorrhage, infectious etiology. We'll give Toradol, Reglan, Benadryl and IV fluids and reassess. Do not feel she needs emergent head imaging.  ED PROGRESS: Patient's headache completely gone. Blood sugar has improved to 220. She has a white blood cell count of 17.6 but this appears to be chronic for her. Again no sign of DKA. Bicarbonate is 25. Urine pregnancy test is negative. I recommended she check her blood sugar closely at home, increase her metformin to 500 twice a day or discuss this further with her doctor ASAP. Discussed return precautions. She verbalized understanding and is comfortable with this plan.    At this time, I do not feel there is any life-threatening condition present. I have reviewed and discussed all results (EKG, imaging, lab, urine as appropriate), exam findings with patient/family.  I have reviewed nursing notes and appropriate previous records.  I feel the patient is safe to be discharged home without further emergent workup and can continue workup as an outpatient as needed. Discussed usual and customary return precautions. Patient/family verbalize understanding and are comfortable with this plan.  Outpatient follow-up has been provided. All questions have been answered.   I personally performed the services described in this documentation, which was scribed in my presence. The recorded information has been reviewed and is accurate.      [place], DO 06/27/16 [phone]

## 2016-06-27 LAB — CBG MONITORING, ED
Glucose-Capillary: 220 mg/dL — ABNORMAL HIGH (ref 65–99)
Glucose-Capillary: 352 mg/dL — ABNORMAL HIGH (ref 65–99)
Glucose-Capillary: 386 mg/dL — ABNORMAL HIGH (ref 65–99)

## 2016-06-27 LAB — PREGNANCY, URINE: Preg Test, Ur: NEGATIVE

## 2016-06-27 MED ORDER — INSULIN ASPART 100 UNIT/ML ~~LOC~~ SOLN
10.0000 [IU] | Freq: Once | SUBCUTANEOUS | Status: AC
  Administered 2016-06-27: 10 [IU] via INTRAVENOUS
  Filled 2016-06-27: qty 1

## 2016-06-27 MED ORDER — INSULIN ASPART 100 UNIT/ML ~~LOC~~ SOLN
5.0000 [IU] | Freq: Once | SUBCUTANEOUS | Status: AC
  Administered 2016-06-27: 5 [IU] via INTRAVENOUS
  Filled 2016-06-27: qty 1

## 2016-06-27 MED ORDER — SODIUM CHLORIDE 0.9 % IV BOLUS (SEPSIS)
1000.0000 mL | Freq: Once | INTRAVENOUS | Status: AC
  Administered 2016-06-27: 1000 mL via INTRAVENOUS

## 2016-06-27 NOTE — Discharge Instructions (Signed)
I recommend you increase your metformin to 500 mg twice a day. You may discuss this further with your primary care physician.

## 2016-07-25 ENCOUNTER — Other Ambulatory Visit: Payer: Self-pay | Admitting: Obstetrics & Gynecology

## 2016-10-20 ENCOUNTER — Other Ambulatory Visit: Payer: Self-pay | Admitting: Obstetrics & Gynecology

## 2016-12-02 DIAGNOSIS — Z9010 Acquired absence of unspecified breast and nipple: Secondary | ICD-10-CM | POA: Insufficient documentation

## 2017-02-09 IMAGING — US ULTRASOUND LEFT BREAST LIMITED
1 series · 4 of 4 positions shown · non-contrast
Comparison: Ultrasound left breast 03/15/2016.

CLINICAL DATA: Patient with history of recurrent left breast
abscess with prior treatment including debridement and antibiotics.

EXAM:
ULTRASOUND OF THE LEFT BREAST

[Series 1: ultrasound left breast limited · 0.07mm/px · 4 of 4 slices shown]
[im 1/4]
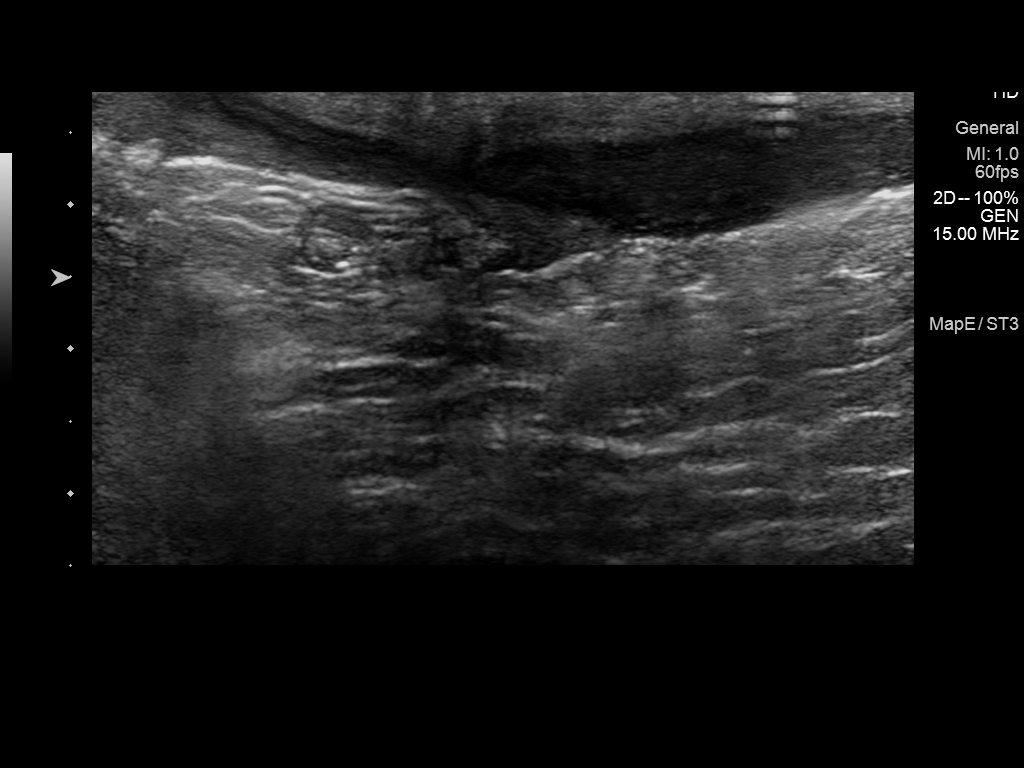
[im 2/4]
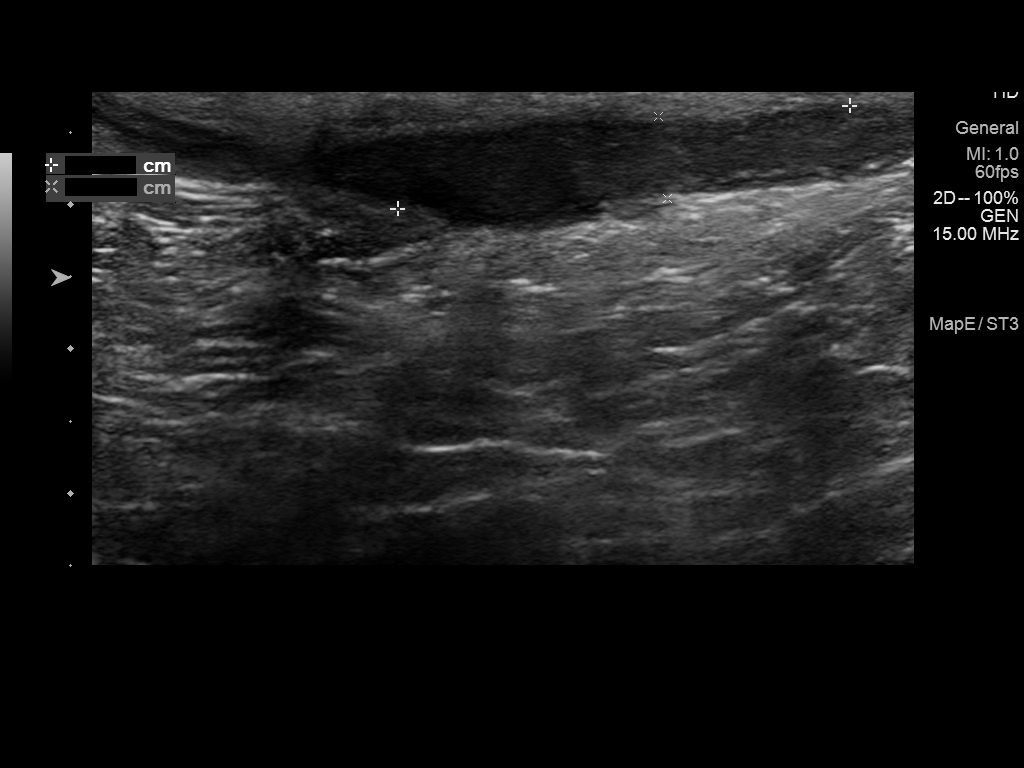
[im 3/4]
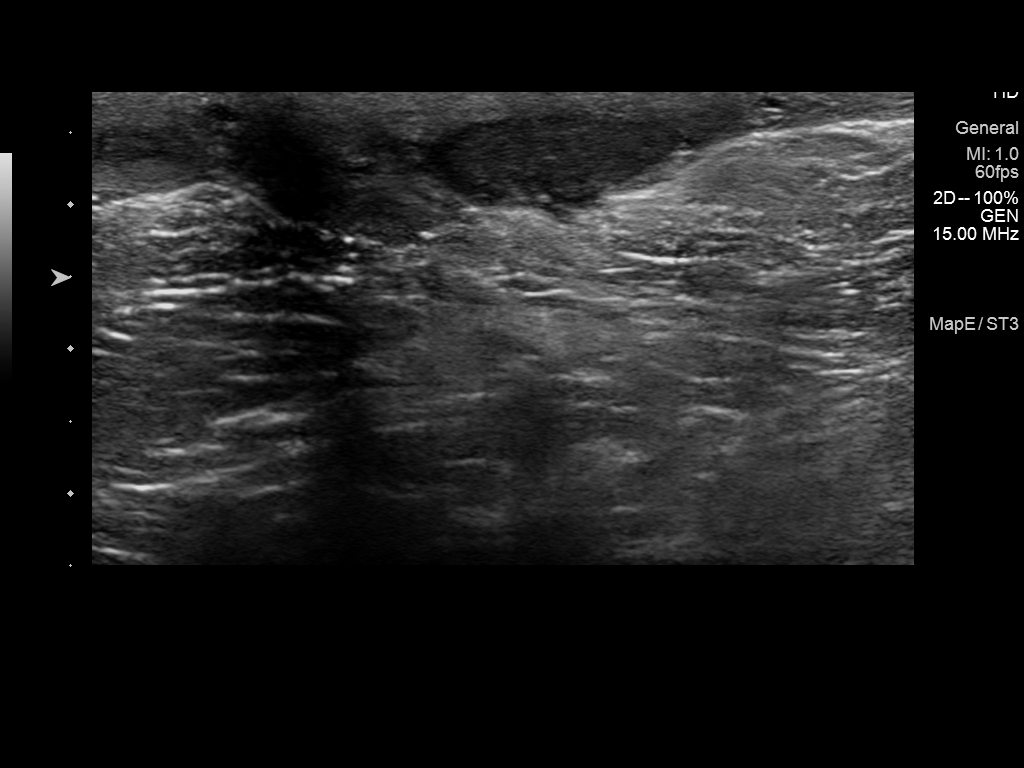
[im 4/4]
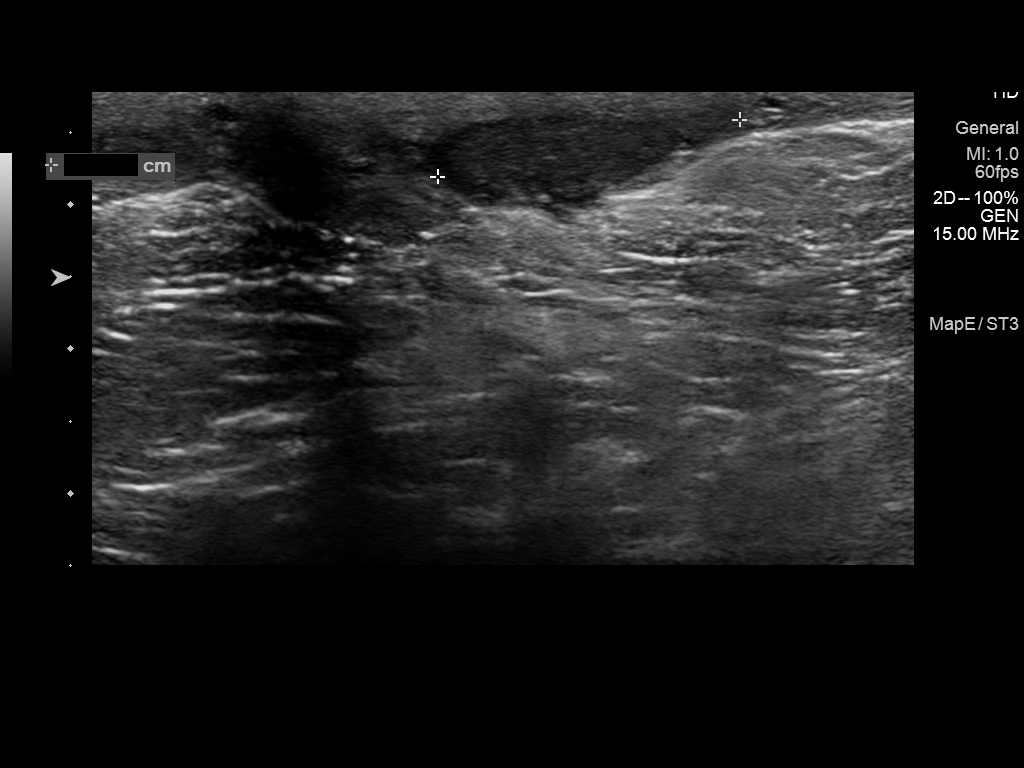

[4 of 4 positions shown; findings below may reference images not displayed]

FINDINGS: On physical exam, there is an area of firmness within the subareolar
left breast anterior depth. There is a scar seen more superiorly at
the 12 o'clock position. Pus is draining from the surgical incision
site at the 12 o'clock position left breast.

Targeted ultrasound is performed, showing a 3.2 x 0.6 x 2.1 cm
hypoechoic complicated fluid collection within the retroareolar left
breast, decreased in size from recent prior examination.
Additionally, it is noted that this fluid collection connects with
the surgical incision site at the 12 o'clock position left breast
through a sinus tract.
IMPRESSION: Interval decrease in size of retroareolar left breast abscess after
antibiotic therapy.

Note the abscess drains through the surgical incision site at the 12
o'clock position left breast through a fistulous connection,
demonstrated sonographically.

RECOMMENDATION:
Continued clinical evaluation for recurrent/ chronic subareolar left
breast abscess. As this abscess appears to be draining spontaneously
through an overlying incision site, no aspiration was performed
today.

Recommend follow-up ultrasound in 6- 8 weeks after continued
clinical management.

I have discussed the findings and recommendations with the patient.
Results were also provided in writing at the conclusion of the
visit. If applicable, a reminder letter will be sent to the patient
regarding the next appointment.

BI-RADS CATEGORY  3: Probably benign.

## 2017-06-05 ENCOUNTER — Other Ambulatory Visit: Payer: Medicaid Other | Admitting: Obstetrics & Gynecology

## 2017-06-12 ENCOUNTER — Ambulatory Visit (INDEPENDENT_AMBULATORY_CARE_PROVIDER_SITE_OTHER): Payer: Medicaid Other | Admitting: Otolaryngology

## 2017-06-12 ENCOUNTER — Other Ambulatory Visit (INDEPENDENT_AMBULATORY_CARE_PROVIDER_SITE_OTHER): Payer: Self-pay | Admitting: Otolaryngology

## 2017-06-12 DIAGNOSIS — J343 Hypertrophy of nasal turbinates: Secondary | ICD-10-CM | POA: Diagnosis not present

## 2017-06-12 DIAGNOSIS — H6122 Impacted cerumen, left ear: Secondary | ICD-10-CM

## 2017-06-12 DIAGNOSIS — H6983 Other specified disorders of Eustachian tube, bilateral: Secondary | ICD-10-CM | POA: Diagnosis not present

## 2017-06-12 DIAGNOSIS — Z029 Encounter for administrative examinations, unspecified: Secondary | ICD-10-CM

## 2017-06-12 DIAGNOSIS — J310 Chronic rhinitis: Secondary | ICD-10-CM

## 2017-06-12 DIAGNOSIS — H7201 Central perforation of tympanic membrane, right ear: Secondary | ICD-10-CM | POA: Diagnosis not present

## 2017-06-15 ENCOUNTER — Ambulatory Visit (INDEPENDENT_AMBULATORY_CARE_PROVIDER_SITE_OTHER): Payer: Medicaid Other | Admitting: Otolaryngology

## 2017-06-15 DIAGNOSIS — G960 Cerebrospinal fluid leak: Secondary | ICD-10-CM

## 2017-06-15 DIAGNOSIS — J343 Hypertrophy of nasal turbinates: Secondary | ICD-10-CM

## 2017-06-15 DIAGNOSIS — J310 Chronic rhinitis: Secondary | ICD-10-CM

## 2017-06-21 ENCOUNTER — Other Ambulatory Visit (INDEPENDENT_AMBULATORY_CARE_PROVIDER_SITE_OTHER): Payer: Self-pay | Admitting: Otolaryngology

## 2017-06-21 DIAGNOSIS — G9600 Cerebrospinal fluid leak, unspecified: Secondary | ICD-10-CM

## 2017-06-23 ENCOUNTER — Ambulatory Visit (HOSPITAL_COMMUNITY)
Admission: RE | Admit: 2017-06-23 | Discharge: 2017-06-23 | Disposition: A | Discharge status: Home / Self Care | Payer: Medicaid Other | Source: Ambulatory Visit | Attending: Otolaryngology | Admitting: Otolaryngology

## 2017-06-23 DIAGNOSIS — G9600 Cerebrospinal fluid leak, unspecified: Secondary | ICD-10-CM

## 2017-06-26 ENCOUNTER — Emergency Department (HOSPITAL_COMMUNITY): Payer: Medicaid Other

## 2017-06-26 ENCOUNTER — Inpatient Hospital Stay (HOSPITAL_COMMUNITY)
Admission: EM | Admit: 2017-06-26 | Discharge: 2017-07-03 | DRG: 026 | Disposition: A | Discharge status: Home Health | Payer: Medicaid Other | Attending: Neurosurgery | Admitting: Neurosurgery

## 2017-06-26 ENCOUNTER — Encounter (HOSPITAL_COMMUNITY): Payer: Self-pay | Admitting: Emergency Medicine

## 2017-06-26 DIAGNOSIS — G9600 Cerebrospinal fluid leak, unspecified: Secondary | ICD-10-CM | POA: Diagnosis present

## 2017-06-26 DIAGNOSIS — Z7984 Long term (current) use of oral hypoglycemic drugs: Secondary | ICD-10-CM | POA: Diagnosis not present

## 2017-06-26 DIAGNOSIS — Z6841 Body Mass Index (BMI) 40.0 and over, adult: Secondary | ICD-10-CM

## 2017-06-26 DIAGNOSIS — F419 Anxiety disorder, unspecified: Secondary | ICD-10-CM | POA: Diagnosis present

## 2017-06-26 DIAGNOSIS — F17210 Nicotine dependence, cigarettes, uncomplicated: Secondary | ICD-10-CM | POA: Diagnosis present

## 2017-06-26 DIAGNOSIS — F329 Major depressive disorder, single episode, unspecified: Secondary | ICD-10-CM | POA: Diagnosis present

## 2017-06-26 DIAGNOSIS — F4310 Post-traumatic stress disorder, unspecified: Secondary | ICD-10-CM | POA: Diagnosis present

## 2017-06-26 DIAGNOSIS — R51 Headache: Secondary | ICD-10-CM | POA: Diagnosis present

## 2017-06-26 DIAGNOSIS — E119 Type 2 diabetes mellitus without complications: Secondary | ICD-10-CM

## 2017-06-26 DIAGNOSIS — M542 Cervicalgia: Secondary | ICD-10-CM | POA: Diagnosis present

## 2017-06-26 DIAGNOSIS — D72829 Elevated white blood cell count, unspecified: Secondary | ICD-10-CM | POA: Diagnosis present

## 2017-06-26 DIAGNOSIS — Q010 Frontal encephalocele: Secondary | ICD-10-CM | POA: Diagnosis not present

## 2017-06-26 DIAGNOSIS — Z792 Long term (current) use of antibiotics: Secondary | ICD-10-CM

## 2017-06-26 DIAGNOSIS — Z79899 Other long term (current) drug therapy: Secondary | ICD-10-CM | POA: Diagnosis not present

## 2017-06-26 HISTORY — DX: Post-traumatic stress disorder, unspecified: F43.10

## 2017-06-26 LAB — URINALYSIS, ROUTINE W REFLEX MICROSCOPIC
Bilirubin Urine: NEGATIVE
GLUCOSE, UA: NEGATIVE mg/dL
Hgb urine dipstick: NEGATIVE
Ketones, ur: NEGATIVE mg/dL
NITRITE: NEGATIVE
PROTEIN: 30 mg/dL — AB
SPECIFIC GRAVITY, URINE: 1.023 (ref 1.005–1.030)
pH: 5 (ref 5.0–8.0)

## 2017-06-26 LAB — CBC WITH DIFFERENTIAL/PLATELET
Basophils Absolute: 0 10*3/uL (ref 0.0–0.1)
Basophils Relative: 0 %
Eosinophils Absolute: 0.3 10*3/uL (ref 0.0–0.7)
Eosinophils Relative: 2 %
HCT: 36.2 % (ref 36.0–46.0)
HEMOGLOBIN: 11.4 g/dL — AB (ref 12.0–15.0)
LYMPHS ABS: 2.8 10*3/uL (ref 0.7–4.0)
LYMPHS PCT: 16 %
MCH: 24.7 pg — ABNORMAL LOW (ref 26.0–34.0)
MCHC: 31.5 g/dL (ref 30.0–36.0)
MCV: 78.4 fL (ref 78.0–100.0)
MONO ABS: 0.7 10*3/uL (ref 0.1–1.0)
MONOS PCT: 4 %
NEUTROS ABS: 13.3 10*3/uL — AB (ref 1.7–7.7)
NEUTROS PCT: 78 %
Platelets: 258 10*3/uL (ref 150–400)
RBC: 4.62 MIL/uL (ref 3.87–5.11)
RDW: 15.3 % (ref 11.5–15.5)
WBC: 17.1 10*3/uL — ABNORMAL HIGH (ref 4.0–10.5)

## 2017-06-26 LAB — I-STAT BETA HCG BLOOD, ED (MC, WL, AP ONLY): I-stat hCG, quantitative: <5 m[IU]/mL (ref <5)

## 2017-06-26 LAB — BASIC METABOLIC PANEL
ANION GAP: 9 (ref 5–15)
BUN: 7 mg/dL (ref 6–20)
CALCIUM: 9.1 mg/dL (ref 8.9–10.3)
CHLORIDE: 101 mmol/L (ref 101–111)
CO2: 24 mmol/L (ref 22–32)
CREATININE: 0.87 mg/dL (ref 0.44–1.00)
GFR calc non Af Amer: >60 mL/min (ref >60)
GLUCOSE: 169 mg/dL — AB (ref 65–99)
Potassium: 3.9 mmol/L (ref 3.5–5.1)
Sodium: 134 mmol/L — ABNORMAL LOW (ref 135–145)

## 2017-06-26 NOTE — ED Notes (Signed)
Pt's sister came into room requesting to speak with MD Shanon Brow.  RN tried to make family aware of update.  Pt rude stating wanting updates.  MD Shanon Brow called and states she would not be able to come down to speak to family.  Family updated and were rude upon information of bed request, transport and the time of waiting for a bed.

## 2017-06-26 NOTE — ED Provider Notes (Signed)
[place] DEPT Provider Note   CSN: [id number] Arrival date & time: 06/26/17  1735     History   Chief Complaint Chief Complaint  Patient presents with  . Neck Pain    HPI Judy Nelson is a 31 y.o. female.  HPI  31year-old female streak of non-insulin-dependent diabetes, known CSF leak scheduled for repair by Dr. Benjamine Mola who presents today with several days of body aches, fever, headache, neck pain. She called Dr. Deeann Saint office and was Told to come to ED for further evaluation. She states she has been having body aches off and on for some extended period of time. 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 She has headache and intermittent sharp neck pains. Patient care discussed with Dr. Benjamine Mola who states that she has had definitive studies that showed that drainage is CSF. He has her scheduled for repair and is awaiting MRI.  Past Medical History:  Diagnosis Date  . Anxiety   . Depression   . Heartburn 09/01/2015   no current med.  Marland Kitchen History of anemia 01/2014  . Non-insulin dependent type 2 diabetes mellitus ([place])   . Obesity   . PTSD (post-traumatic stress disorder)   . Scar of breast 08/2015   left  . Sinus headache     Patient Active Problem List   Diagnosis Date Noted  . Left breast abscess 10/05/2014  . Type 2 diabetes mellitus without complication ([place]) 32/95/1884  . Hypokalemia 10/05/2014  . Abscess of breast 10/05/2014  . Depression 06/19/2014  . Anemia 02/23/2014  . Postpartum depression 12/23/2013  . Preeclampsia 11/01/2013  . Pelvic mass 04/15/2013  . Morbid obesity with BMI of 50.0-59.9, adult ([place]) 04/15/2013    Past Surgical History:  Procedure Laterality Date  . BREAST SURGERY    . INCISION AND DRAINAGE ABSCESS Left 10/07/2014   Procedure: INCISION AND DRAINAGE LEFT BREAST ABSCESS;  Surgeon: Jamesetta So, MD;  Location: [facility];  Service: General;  Laterality: Left;  . LAPAROSCOPIC OVARIAN CYSTECTOMY Right 02/05/2014   Procedure:  LAPAROSCOPIC right retroperitoneal (NOT OVARIAN) CYSTECTOMY;  Surgeon: Florian Buff, MD;  Location: [facility];  Service: Gynecology;  Laterality: Right;  . MASS EXCISION Left 09/07/2015   Procedure: Minor SCAR EXCISION LEFT BREAST, PLASTIC CLOSURE;  Surgeon: Cristine Polio, MD;  Location: [facility];  Service: Plastics;  Laterality: Left;  . TYMPANOSTOMY TUBE PLACEMENT Bilateral 10/2014    OB History    Gravida Para Term Preterm AB Living   1 1 1     1    SAB TAB Ectopic Multiple Live Births           1       Home Medications    Prior to Admission medications   Medication Sig Start Date End Date Taking? Authorizing Provider  acetaminophen (TYLENOL) 325 MG tablet Take 650 mg by mouth every 6 (six) hours as needed.    [provider]  ALPRAZolam Duanne Moron) 1 MG tablet TAKE 1 TABLET BY MOUTH THREE TIMES DAILY 07/26/16   Florian Buff, MD  ibuprofen (ADVIL,MOTRIN) 200 MG tablet Take 200 mg by mouth every 6 (six) hours as needed.    [provider]  metFORMIN (GLUCOPHAGE-XR) 500 MG 24 hr tablet TK 1 T PO QD 06/08/15   [provider]  sulfamethoxazole-trimethoprim (BACTRIM DS,SEPTRA DS) 800-160 MG tablet Take 1 tablet by mouth 2 (two) times daily. 02/23/16   Rolland Porter, MD  traMADol (ULTRAM) 50 MG tablet Take 2 tablets (100  mg total) by mouth every 6 (six) hours as needed. 02/23/16   Rolland Porter, MD  venlafaxine XR (EFFEXOR-XR) 150 MG 24 hr capsule TAKE 2 CAPSULES BY MOUTH EVERY DAY 06/09/15   Florian Buff, MD  venlafaxine XR (EFFEXOR-XR) 150 MG 24 hr capsule TAKE 2 CAPSULES BY MOUTH EVERY DAY 10/21/16   Florian Buff, MD    Family History Family History  Problem Relation Age of Onset  . Diabetes Father   . Hypertension Father   . Heart disease Daughter        heart murmur  . Cancer Maternal Grandmother        ovarian cancer  . Obesity Paternal Grandmother   . Diabetes Paternal Grandfather     Social History Social History  Substance Use Topics    . Smoking status: Current Every Day Smoker    Packs/day: 0.50    Years: 7.00    Types: Cigarettes  . Smokeless tobacco: Never Used  . Alcohol use Yes     Comment: occasionally     Allergies   Patient has no known allergies.   Review of Systems Review of Systems  Constitutional: Positive for chills, fatigue and fever.  HENT: Positive for congestion and rhinorrhea.   Eyes: Negative.   Respiratory: Negative.   Cardiovascular: Negative.   Gastrointestinal: Negative.   Genitourinary: Negative.   Musculoskeletal: Negative.   Neurological: Negative.   Hematological: Negative.   Psychiatric/Behavioral: Negative.   All other systems reviewed and are negative.    Physical Exam Updated Vital Signs BP 117/63 (BP Location: Right Arm)   Pulse 90   Temp 98 F (36.7 C) (Oral)   Resp 18   Ht 1.702 m (5\' 7" )   Wt (!) 165.1 kg (364 lb)   LMP 06/02/2017   SpO2 99%   BMI 57.01 kg/m   Physical Exam  Constitutional: She is oriented to person, place, and time. She appears well-developed and well-nourished.  Morbidly obese female who appears in no acute distress  HENT:  Head: Normocephalic and atraumatic.  Right Ear: External ear normal.  Left Ear: External ear normal.  Nose: Nose normal.  Mouth/Throat: Oropharynx is clear and moist.  Eyes: Pupils are equal, round, and reactive to light. EOM are normal.  Neck: Normal range of motion. Neck supple.  Cardiovascular: Normal rate and regular rhythm.   Pulmonary/Chest: Effort normal and breath sounds normal. No respiratory distress.  Abdominal: Soft. Bowel sounds are normal.  Musculoskeletal: Normal range of motion. She exhibits no edema or deformity.  Neurological: She is alert and oriented to person, place, and time.  Skin: Skin is warm and dry. Capillary refill takes less than 2 seconds.  Psychiatric: She has a normal mood and affect.  Nursing note and vitals reviewed.    ED Treatments / Results  Labs (all labs ordered are  listed, but only abnormal results are displayed) Labs Reviewed  CBC WITH DIFFERENTIAL/PLATELET - Abnormal; Notable for the following:       Result Value   WBC 17.1 (*)    Hemoglobin 11.4 (*)    MCH 24.7 (*)    Neutro Abs 13.3 (*)    All other components within normal limits  BASIC METABOLIC PANEL  I-STAT BETA HCG BLOOD, ED ([facility], [facility], [facility] ONLY)    EKG  EKG Interpretation None       Radiology No results found.  Procedures Procedures (including critical care time)  Medications Ordered in ED Medications - No data to display  Initial Impression / Assessment and Plan / ED Course  I have reviewed the triage vital signs and the nursing notes.  Pertinent labs & imaging results that were available during my care of the patient were reviewed by me and considered in my medical decision making (see chart for details).     Patient is well appearing here with a supple neck. However, she does have a leukocytosis. I have discussed the patient's care with Dr. Benjamine Mola. 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 We are unable to obtain MRI here due to patient's weight. Patient will need LP under fluoroscopy. Discussed lp with Dr. Candise Che at [facility] radiology and they will be able to provide.  He will discuss with on call radiologist to be sure he is aware.   Discussed with Dr. Shanon Brow and requests cxr and ua to rule out other sources of infection.  We discussed abx and she is aware they have not been given at this point and opts to await other studies prior to ordering.   Final Clinical Impressions(s) / ED Diagnoses   Final diagnoses:  Neck pain  CSF leak  Leukocytosis, unspecified type    New Prescriptions New Prescriptions   No medications on file     Pattricia Boss, MD 06/26/17 2038

## 2017-06-26 NOTE — H&P (Signed)
History and Physical    Judy Nelson YPP:[id number] DOB: [birth year] DOA: 06/26/2017  PCP: [facility]  Patient coming from: headache  Chief Complaint: leaking from nose  HPI: Judy Nelson is a 31 y.o. female with medical history significant of DM,obesity with CSF leak from her nose for about 6 weeks now.  Pt sees dr Benjamine Mola.  He did an analysis of the fluid in the last week or so and determined it was a csf.  No records in EPIC.  All history taken from patient with some difficulty as she is not happy about her current situation.  She says an MRI was attempted but she was too big for the machine.  It has been mentioned to her she needs an open MRI.  Repair of the leak has not been set up yet.  She called dr Benjamine Mola office today to report a headache, a week of fevers (today was 102) and neck pain who instructed her to come to ED.  She has been on nasal steroids but none orally.  She says the fluid is filling up in her lungs and makes her sob and coughing a lot.  She reports when she lies down the fluid from the leak "are filling up my lungs".  Pt has no h/o chf.  Dr Benjamine Mola was called who advised pt get an LP (dr ray has arranged this to be done TONIGHT at [facility] by IR as she cannot do her due to her size) tonight and will need an open MRI.  Pt is very upset because things are taking so long to be done.  She is upset because she has to wait for a bed [facility] to be [facility].  Her mother is with her who seems to be understandable.  Pt referred for admission for possible meningitis.   Review of Systems: As per HPI otherwise 10 point review of systems negative.   Past Medical History:  Diagnosis Date  . Anxiety   . Depression   . Heartburn 09/01/2015   no current med.  Marland Kitchen History of anemia 01/2014  . Non-insulin dependent type 2 diabetes mellitus ([place])   . Obesity   . PTSD (post-traumatic stress disorder)   . Scar of breast 08/2015   left  . Sinus headache     Past Surgical History:    Procedure Laterality Date  . BREAST SURGERY    . INCISION AND DRAINAGE ABSCESS Left 10/07/2014   Procedure: INCISION AND DRAINAGE LEFT BREAST ABSCESS;  Surgeon: Jamesetta So, MD;  Location: [facility];  Service: General;  Laterality: Left;  . LAPAROSCOPIC OVARIAN CYSTECTOMY Right 02/05/2014   Procedure: LAPAROSCOPIC right retroperitoneal (NOT OVARIAN) CYSTECTOMY;  Surgeon: Florian Buff, MD;  Location: [facility];  Service: Gynecology;  Laterality: Right;  . MASS EXCISION Left 09/07/2015   Procedure: Minor SCAR EXCISION LEFT BREAST, PLASTIC CLOSURE;  Surgeon: Cristine Polio, MD;  Location: [facility];  Service: Plastics;  Laterality: Left;  . TYMPANOSTOMY TUBE PLACEMENT Bilateral 10/2014     reports that she has been smoking Cigarettes.  She has a 3.50 pack-year smoking history. She has never used smokeless tobacco. She reports that she drinks alcohol. She reports that she does not use drugs.  No Known Allergies  Family History  Problem Relation Age of Onset  . Diabetes Father   . Hypertension Father   . Heart disease Daughter        heart murmur  . Cancer Maternal Grandmother  ovarian cancer  . Obesity Paternal Grandmother   . Diabetes Paternal Grandfather     Prior to Admission medications   Medication Sig Start Date End Date Taking? Authorizing Provider  acetaminophen (TYLENOL) 325 MG tablet Take 650 mg by mouth every 6 (six) hours as needed.    [provider]  ALPRAZolam Duanne Moron) 1 MG tablet TAKE 1 TABLET BY MOUTH THREE TIMES DAILY 07/26/16   Florian Buff, MD  ibuprofen (ADVIL,MOTRIN) 200 MG tablet Take 200 mg by mouth every 6 (six) hours as needed.    [provider]  metFORMIN (GLUCOPHAGE-XR) 500 MG 24 hr tablet TK 1 T PO QD 06/08/15   [provider]  sulfamethoxazole-trimethoprim (BACTRIM DS,SEPTRA DS) 800-160 MG tablet Take 1 tablet by mouth 2 (two) times daily. 02/23/16   Rolland Porter, MD  traMADol (ULTRAM) 50 MG tablet Take 2  tablets (100 mg total) by mouth every 6 (six) hours as needed. 02/23/16   Rolland Porter, MD  venlafaxine XR (EFFEXOR-XR) 150 MG 24 hr capsule TAKE 2 CAPSULES BY MOUTH EVERY DAY 06/09/15   Florian Buff, MD  venlafaxine XR (EFFEXOR-XR) 150 MG 24 hr capsule TAKE 2 CAPSULES BY MOUTH EVERY DAY 10/21/16   Florian Buff, MD    Physical Exam: Vitals:   06/26/17 1752  BP: 117/63  Pulse: 90  Resp: 18  Temp: 98 F (36.7 C)  TempSrc: Oral  SpO2: 99%  Weight: (!) 165.1 kg (364 lb)  Height: 5\' 7"  (1.702 m)    Constitutional: NAD, calm, comfortable, nontoxic with no signs of meningitis on exam Vitals:   06/26/17 1752  BP: 117/63  Pulse: 90  Resp: 18  Temp: 98 F (36.7 C)  TempSrc: Oral  SpO2: 99%  Weight: (!) 165.1 kg (364 lb)  Height: 5\' 7"  (1.702 m)   Eyes: PERRL, lids and conjunctivae normal ENMT: Mucous membranes are moist. Posterior pharynx clear of any exudate or lesions.Normal dentition.  Neck: normal, supple, no masses, no thyromegaly Respiratory: clear to auscultation bilaterally, no wheezing, no crackles. Normal respiratory effort. No accessory muscle use.  Cardiovascular: Regular rate and rhythm, no murmurs / rubs / gallops. No extremity edema. 2+ pedal pulses. No carotid bruits.  Abdomen: no tenderness, no masses palpated. No hepatosplenomegaly. Bowel sounds positive.  Musculoskeletal: no clubbing / cyanosis. No joint deformity upper and lower extremities. Good ROM, no contractures. Normal muscle tone.  Skin: no rashes, lesions, ulcers. No induration Neurologic: CN 2-12 grossly intact. Sensation intact, DTR normal. Strength 5/5 in all 4.  Psychiatric: aggravated, difficult to get history due to her uncooperation, seems to have unrealistic expectations. Alert and oriented x 3.   Labs on Admission: I have personally reviewed following labs and imaging studies  CBC:  Recent Labs Lab 06/26/17 1925  WBC 17.1*  NEUTROABS 13.3*  HGB 11.4*  HCT 36.2  MCV 78.4  PLT 010    Basic Metabolic Panel:  Recent Labs Lab 06/26/17 1925  NA 134*  K 3.9  CL 101  CO2 24  GLUCOSE 169*  BUN 7  CREATININE 0.87  CALCIUM 9.1   GFR: Estimated Creatinine Clearance: 153.7 mL/min (by C-G formula based on SCr of 0.87 mg/dL).  Urine analysis:    Component Value Date/Time   COLORURINE YELLOW 06/26/2016 2149   APPEARANCEUR CLEAR 06/26/2016 2149   LABSPEC 1.020 06/26/2016 2149   PHURINE 6.0 06/26/2016 2149   GLUCOSEU >1000 (A) 06/26/2016 2149   HGBUR TRACE (A) 06/26/2016 2149   BILIRUBINUR NEGATIVE 06/26/2016 2149  [place] NEGATIVE 06/26/2016 2149   PROTEINUR 30 (A) 06/26/2016 2149   UROBILINOGEN 0.2 10/05/2014 1300   NITRITE NEGATIVE 06/26/2016 2149   LEUKOCYTESUR SMALL (A) 06/26/2016 2149    Radiological Exams on Admission: Dg Chest 2 View  Result Date: 06/26/2017 CLINICAL DATA:  Fever, headache and known CSF leak. EXAM: CHEST  2 VIEW COMPARISON:  None. FINDINGS: The cardiac silhouette, mediastinal and hilar contours are within normal limits. The lungs are grossly clear. No pleural effusion. The bony thorax is intact. IMPRESSION: No acute cardiopulmonary findings. Electronically Signed   By: Marijo Sanes M.D.   On: 06/26/2017 21:36     Assessment/Plan 31 yo female with morbid obesity, NIIDM comes in with report of fever and 6 weeks of CSF leak  Principal Problem:   CSF leak- wbc is 18.  Transfer [facility] tonight.  Dr ray has confirmed with me she has spoken to IR and arranged for LP to [facility] TON[facility].  As as that is done, will start abx.  Pt will need open MRI arranged.  Written care order in chart for [facility] at [facility] to notify IR and dr Benjamine Mola of her arrival to floor.   Pt has not signs on physical exam of meninigitis but she is certainly high risk, try to get LP done before starting abx.   Active Problems:   Morbid obesity with BMI of 50.0-59.9, adult ([place])- noted   Type 2 diabetes mellitus without complication ([place])- ssi   Leukocytosis- noted    Neck pain- noted   Anxiety- noted  I have thoroughly explained everything and apologized for her wait here in the Ed.  Pt is very aggravated and clearly is not happy.  She expects to be transferred to [facility] within the hour.  I have explained that this will take time and will take longer than an hour for her to be transported.  This explanation I dont think has been sufficient.  I have notified the house sup at [facility] of her dissatisfaction.  Mom is very understandable.  DVT prophylaxis:  scds Code Status:  full Family Communication:  mom  Disposition Plan:  Per day team Consults called:  ENT, IR Admission status:  admission   DAVID,RACHAL A MD [facility]  If 7PM-7AM, please contact night-coverage [email or web address] Password [facility]  06/26/2017, 9:49 PM

## 2017-06-27 ENCOUNTER — Encounter (HOSPITAL_COMMUNITY): Payer: Self-pay | Admitting: *Deleted

## 2017-06-27 ENCOUNTER — Inpatient Hospital Stay (HOSPITAL_COMMUNITY): Payer: Medicaid Other

## 2017-06-27 DIAGNOSIS — G960 Cerebrospinal fluid leak: Secondary | ICD-10-CM

## 2017-06-27 DIAGNOSIS — D72829 Elevated white blood cell count, unspecified: Secondary | ICD-10-CM

## 2017-06-27 LAB — CBC
HCT: 33.9 % — ABNORMAL LOW (ref 36.0–46.0)
HEMOGLOBIN: 10.8 g/dL — AB (ref 12.0–15.0)
MCH: 24.8 pg — ABNORMAL LOW (ref 26.0–34.0)
MCHC: 31.9 g/dL (ref 30.0–36.0)
MCV: 77.8 fL — AB (ref 78.0–100.0)
PLATELETS: 249 10*3/uL (ref 150–400)
RBC: 4.36 MIL/uL (ref 3.87–5.11)
RDW: 15.3 % (ref 11.5–15.5)
WBC: 16.7 10*3/uL — ABNORMAL HIGH (ref 4.0–10.5)

## 2017-06-27 LAB — BASIC METABOLIC PANEL
Anion gap: 7 (ref 5–15)
BUN: 7 mg/dL (ref 6–20)
CHLORIDE: 101 mmol/L (ref 101–111)
CO2: 26 mmol/L (ref 22–32)
CREATININE: 1.04 mg/dL — AB (ref 0.44–1.00)
Calcium: 9 mg/dL (ref 8.9–10.3)
GFR calc Af Amer: >60 mL/min (ref >60)
GFR calc non Af Amer: >60 mL/min (ref >60)
GLUCOSE: 214 mg/dL — AB (ref 65–99)
Potassium: 3.3 mmol/L — ABNORMAL LOW (ref 3.5–5.1)
Sodium: 134 mmol/L — ABNORMAL LOW (ref 135–145)

## 2017-06-27 LAB — GLUCOSE, CAPILLARY
GLUCOSE-CAPILLARY: 149 mg/dL — AB (ref 65–99)
Glucose-Capillary: 116 mg/dL — ABNORMAL HIGH (ref 65–99)
Glucose-Capillary: 192 mg/dL — ABNORMAL HIGH (ref 65–99)
Glucose-Capillary: 173 mg/dL — ABNORMAL HIGH (ref 65–99)

## 2017-06-27 MED ORDER — NICOTINE 21 MG/24HR TD PT24
21.0000 mg | MEDICATED_PATCH | Freq: Every day | TRANSDERMAL | Status: DC
  Administered 2017-06-27 – 2017-07-02 (×5): 21 mg via TRANSDERMAL
  Filled 2017-06-27 (×6): qty 1

## 2017-06-27 MED ORDER — LORAZEPAM 1 MG PO TABS: 1.0000 mg | ORAL_TABLET | Freq: Three times a day (TID) | ORAL | Status: DC | PRN

## 2017-06-27 MED ORDER — SODIUM CHLORIDE 0.9% FLUSH: 3.0000 mL | INTRAVENOUS | Status: DC | PRN

## 2017-06-27 MED ORDER — SODIUM CHLORIDE 0.9% FLUSH
3.0000 mL | Freq: Two times a day (BID) | INTRAVENOUS | Status: DC
  Administered 2017-06-27 – 2017-06-28 (×4): 3 mL via INTRAVENOUS
  Administered 2017-06-29: 10 mL via INTRAVENOUS
  Administered 2017-06-29 – 2017-07-03 (×8): 3 mL via INTRAVENOUS

## 2017-06-27 MED ORDER — GADOBENATE DIMEGLUMINE 529 MG/ML IV SOLN
20.0000 mL | Freq: Once | INTRAVENOUS | Status: AC | PRN
  Administered 2017-06-27: 20 mL via INTRAVENOUS

## 2017-06-27 MED ORDER — KETOROLAC TROMETHAMINE 30 MG/ML IJ SOLN
30.0000 mg | Freq: Once | INTRAMUSCULAR | Status: AC
  Administered 2017-06-27: 30 mg via INTRAVENOUS
  Filled 2017-06-27: qty 1

## 2017-06-27 MED ORDER — SODIUM CHLORIDE 0.9 % IV SOLN
250.0000 mL | INTRAVENOUS | Status: DC | PRN
  Administered 2017-06-30: 16:00:00 via INTRAVENOUS

## 2017-06-27 MED ORDER — INSULIN ASPART 100 UNIT/ML ~~LOC~~ SOLN
0.0000 [IU] | Freq: Three times a day (TID) | SUBCUTANEOUS | Status: DC
  Administered 2017-06-27 – 2017-06-29 (×6): 2 [IU] via SUBCUTANEOUS
  Administered 2017-06-29: 3 [IU] via SUBCUTANEOUS
  Administered 2017-06-29: 1 [IU] via SUBCUTANEOUS
  Administered 2017-06-30 – 2017-07-01 (×2): 3 [IU] via SUBCUTANEOUS
  Administered 2017-07-01: 2 [IU] via SUBCUTANEOUS
  Administered 2017-07-01 – 2017-07-02 (×3): 3 [IU] via SUBCUTANEOUS
  Administered 2017-07-02 – 2017-07-03 (×2): 2 [IU] via SUBCUTANEOUS

## 2017-06-27 MED ORDER — ALPRAZOLAM 0.5 MG PO TABS
1.0000 mg | ORAL_TABLET | Freq: Three times a day (TID) | ORAL | Status: DC | PRN
  Administered 2017-06-27 – 2017-07-02 (×12): 1 mg via ORAL
  Filled 2017-06-27 (×12): qty 2

## 2017-06-27 MED ORDER — METOCLOPRAMIDE HCL 5 MG/ML IJ SOLN
10.0000 mg | Freq: Once | INTRAMUSCULAR | Status: AC
  Administered 2017-06-27: 10 mg via INTRAVENOUS
  Filled 2017-06-27: qty 2

## 2017-06-27 NOTE — Progress Notes (Signed)
PROGRESS NOTE    Judy Nelson  WCB:[id number] DOB: May 13, 1986 DOA: 06/26/2017 PCP: [facility]   Chief Complaint  Patient presents with  . Neck Pain    Brief Narrative:  HPI On 06/26/17 by Dr. Steward Ros Judy Nelson is a 31 y.o. female with medical history significant of DM,obesity with CSF leak from her nose for about 6 weeks now.  Pt sees dr Benjamine Mola.  He did an analysis of the fluid in the last week or so and determined it was a csf.  No records in EPIC.  All history taken from patient with some difficulty as she is not happy about her current situation.  She says an MRI was attempted but she was too big for the machine.  It has been mentioned to her she needs an open MRI.  Repair of the leak has not been set up yet.  She called dr Benjamine Mola office today to report a headache, a week of fevers (today was 102) and neck pain who instructed her to come to ED.  She has been on nasal steroids but none orally.  She says the fluid is filling up in her lungs and makes her sob and coughing a lot.  She reports when she lies down the fluid from the leak "are filling up my lungs".  Pt has no h/o chf.  Dr Benjamine Mola was called who advised pt get an LP (dr ray has arranged this to be done TONIGHT [facility] by [facility] as she cannot do her due to her size) tonight and will need an open MRI.  Pt is very upset because things are taking so long to be done.  She is upset because she has to wait for a bed [facility] to be [facility].  Her mother is with her who seems to be understandable.  Pt referred for admission for possible meningitis. Assessment & Plan   Nasal CSF leak/Encephalocele -Nasal CSF leak has been ongoing for 6 weeks -MRI brain showed no acute intracranial process. No MR findings of meningitis or intracranial hypotension. 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 Trace sphenoid sinus and ear-fluid levels  clinical for CSF rhinorrhea. -ENT, Dr. Benjamine Mola, consulted and appreciated -Neurosurgery, Dr. Kathyrn Sheriff consulted and appreciated -LP was ordered however patient does not have any clinical signs of meningitis at this time. Denies any neck pain or stiffness. Currently not having any fevers.  Positional headache -Suspect secondary to CSF leak, continue pain control  Diabetes mellitus, type II -Was taking metformin at home, currently held -continue ISS and CBG monitoring   Leukocytosis -likely reactive, do not feel patient is infectious at this time  Morbid obesity -BMI 57 -Patient will discuss lifestyle modifications with her primary care physician upon discharge  Tobacco abuse -Continue nicotine patch -discussed smoking cessation  DVT Prophylaxis  SCDs  Code Status: Full  Family Communication: None at bedside  Disposition Plan: Admitted, pending ENT and neurosurgery consults  Consultants Neurosurgery ENT  Procedures  None  Antibiotics   Anti-infectives    None      Subjective:   Judy Nelson seen and examined today.  Patient denying any neck pain at this time. Does complain of continued headache. Denies chest pain, shortness breath, abdominal pain, nausea vomiting, diarrhea or constipation.  Objective:   Vitals:   06/26/17 2338 06/27/17 0036 06/27/17 0441 06/27/17 1002  BP: (!) 155/94 136/82 (!) 97/56 134/78  Pulse: 97 91 84 88  Resp: 17 18 18 18   Temp:  98.7 F (37.1 C) 98.7 F (37.1 C) 98.1 F (36.7 C)  TempSrc:  Oral Oral Oral  SpO2: 100% 100% 98%   Weight:      Height:  5' 7.5" (1.715 m)      Intake/Output Summary (Last 24 hours) at 06/27/17 1219 Last data filed at 06/27/17 0816  Gross per 24 hour  Intake              240 ml  Output                0 ml  Net              240 ml   Filed Weights   06/26/17 1752  Weight: (!) 165.1 kg (364 lb)    Exam  General: Well developed, well nourished, NAD, appears stated age  HEENT: NCAT,  mucous  membranes moist.   Neck: Supple, no JVD, no masses, no TTP or pain elicited with neck ROM  Cardiovascular: S1 S2 auscultated, no rubs, murmurs or gallops. Regular rate and rhythm.  Respiratory: Clear to auscultation bilaterally with equal chest rise  Abdomen: Soft, obese, nontender, nondistended, + bowel sounds  Extremities: warm dry without cyanosis clubbing or edema  Neuro: AAOx3, nonfocal  Psych: Normal affect and demeanor with intact judgement and insight   Data Reviewed: I have personally reviewed following labs and imaging studies  CBC:  Recent Labs Lab 06/26/17 1925 06/27/17 0916  WBC 17.1* 16.7*  NEUTROABS 13.3*  --   HGB 11.4* 10.8*  HCT 36.2 33.9*  MCV 78.4 77.8*  PLT 258 818   Basic Metabolic Panel:  Recent Labs Lab 06/26/17 1925 06/27/17 0916  NA 134* 134*  K 3.9 3.3*  CL 101 101  CO2 24 26  GLUCOSE 169* 214*  BUN 7 7  CREATININE 0.87 1.04*  CALCIUM 9.1 9.0   GFR: Estimated Creatinine Clearance: 129.5 mL/min (A) (by C-G formula based on SCr of 1.04 mg/dL (H)). Liver Function Tests: No results for input(s): AST, ALT, ALKPHOS, BILITOT, PROT, ALBUMIN in the last 168 hours. No results for input(s): LIPASE, AMYLASE in the last 168 hours. No results for input(s): AMMONIA in the last 168 hours. Coagulation Profile: No results for input(s): INR, PROTIME in the last 168 hours. Cardiac Enzymes: No results for input(s): CKTOTAL, CKMB, CKMBINDEX, TROPONINI in the last 168 hours. BNP (last 3 results) No results for input(s): PROBNP in the last 8760 hours. HbA1C: No results for input(s): HGBA1C in the last 72 hours. CBG:  Recent Labs Lab 06/27/17 0657 06/27/17 1123  GLUCAP 116* 149*   Lipid Profile: No results for input(s): CHOL, HDL, LDLCALC, TRIG, CHOLHDL, LDLDIRECT in the last 72 hours. Thyroid Function Tests: No results for input(s): TSH, T4TOTAL, FREET4, T3FREE, THYROIDAB in the last 72 hours. Anemia Panel: No results for input(s):  VITAMINB12, FOLATE, FERRITIN, TIBC, IRON, RETICCTPCT in the last 72 hours. Urine analysis:    Component Value Date/Time   COLORURINE YELLOW 06/26/2017 2230   APPEARANCEUR CLOUDY (A) 06/26/2017 2230   LABSPEC 1.023 06/26/2017 2230   PHURINE 5.0 06/26/2017 2230   GLUCOSEU NEGATIVE 06/26/2017 2230   HGBUR NEGATIVE 06/26/2017 2230   BILIRUBINUR NEGATIVE 06/26/2017 2230   KETONESUR NEGATIVE 06/26/2017 2230   PROTEINUR 30 (A) 06/26/2017 2230   UROBILINOGEN 0.2 10/05/2014 1300   NITRITE NEGATIVE 06/26/2017 2230   LEUKOCYTESUR LARGE (A) 06/26/2017 2230   Sepsis Labs: @LABRCNTIP (procalcitonin:4,lacticidven:4)  ) Recent Results (from the past 240  hour(s))  Blood culture (routine x 2)     Status: None (Preliminary result)   Collection Time: 06/26/17  8:52 PM  Result Value Ref Range Status   Specimen Description BLOOD RIGHT ARM  Final   Special Requests   Final    BOTTLES DRAWN AEROBIC AND ANAEROBIC Blood Culture adequate volume   Culture NO GROWTH < 12 HOURS  Final   Report Status PENDING  Incomplete  Blood culture (routine x 2)     Status: None (Preliminary result)   Collection Time: 06/26/17  8:55 PM  Result Value Ref Range Status   Specimen Description BLOOD RIGHT ARM  Final   Special Requests   Final    BOTTLES DRAWN AEROBIC AND ANAEROBIC Blood Culture adequate volume   Culture NO GROWTH < 12 HOURS  Final   Report Status PENDING  Incomplete      Radiology Studies: Dg Chest 2 View  Result Date: 06/26/2017 CLINICAL DATA:  Fever, headache and known CSF leak. EXAM: CHEST  2 VIEW COMPARISON:  None. FINDINGS: The cardiac silhouette, mediastinal and hilar contours are within normal limits. The lungs are grossly clear. No pleural effusion. The bony thorax is intact. IMPRESSION: No acute cardiopulmonary findings. Electronically Signed   By: Marijo Sanes M.D.   On: 06/26/2017 21:36   Mr Jeri Cos GE Contrast  Result Date: 06/27/2017 CLINICAL DATA:  CSF leak awaiting repair. Fever.  Assess for meningitis. EXAM: MRI HEAD WITHOUT AND WITH CONTRAST TECHNIQUE: Multiplanar, multiecho pulse sequences of the brain and surrounding structures were obtained without and with intravenous contrast. CONTRAST:  47mL MULTIHANCE GADOBENATE DIMEGLUMINE 529 MG/ML IV SOLN COMPARISON:  Maxillofacial CT June 23, 2017 FINDINGS: INTRACRANIAL CONTENTS: Small RIGHT frontal encephalocele through the cribriform defect as seen on prior CT, extending into the RIGHT ethmoid air cells. No vasogenic edema. No reduced diffusion to suggest acute ischemia or typical infection. No susceptibility artifact to suggest hemorrhage. The ventricles and sulci are normal for patient's age. No suspicious parenchymal signal, masses, mass effect. No abnormal intraparenchymal or extra-axial enhancement. Preserved prepontine cistern. No abnormal extra-axial fluid collections. No extra-axial masses. VASCULAR: Normal major intracranial vascular flow voids present at skull base. SKULL AND UPPER CERVICAL SPINE: Partially empty sella. No suspicious calvarial bone marrow signal. Craniocervical junction maintained. Multiple scalp nodules seen with sebaceous or trichilemmal cysts. SINUSES/ORBITS: LEFT mastoid effusion. Trace sphenoid sinus air-fluid levels. The included ocular globes and orbital contents are non-suspicious. OTHER: None. IMPRESSION: 1. No acute intracranial process. No MR findings of meningitis or intracranial hypotension. 2. 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 Trace sphenoid sinus air-fluid levels equivocal for CSF rhinorrhea. 3. Partially empty sella. Electronically Signed   By: Elon Alas M.D.   On: 06/27/2017 06:50     Scheduled Meds: . insulin aspart  0-9 Units Subcutaneous TID WC  . nicotine  21 mg Transdermal Daily  . sodium chloride flush  3 mL Intravenous Q12H   Continuous Infusions: . sodium chloride        LOS: 1 day   Time Spent in minutes   30 minutes  Judy Nelson D.O. on 06/27/2017 at 12:19 PM  Between 7am to 7pm - Pager - [phone]  After 7pm go to [email or web address] - password [email or web address]  And look for the night coverage person covering for me after hours  [facility] Office  [phone]

## 2017-06-27 NOTE — Progress Notes (Signed)
[facility] paged for patient medication update. Patient continues to c/o oral tingling. Patient states "I feel ike its withdrawal symptoms from not getting my lexapro." Patient has no oral cyanosis. Patient denies SOB and any difficulty breathing. RN will continue to monitor patient

## 2017-06-27 NOTE — Consult Note (Signed)
Chief Complaint   Fluid leak  History of Present Illness  Judy Nelson is a 31 y.o. female presenting to the hospital with persistent thin fluid drainage from her nose. She says the drainage started approximately 8 weeks ago without any inciting event. She describes thin, watery fluid which drains intermittently from her nose, especially if she sits up and leans forward. This was tested in Dr. Deeann Saint office, and is reportedly confirmed to be CSF. She began to have positional headaches as well as reported fever at home. She was therefore instructed to come to the emergency department and was admitted. She has not had any fevers while here, and has minimal positional headaches currently. Of note, she does not report any changes in her vision, difficulty with smell or taste, and no numbness tingling or weakness of the arms or legs.  Past Medical History   Past Medical History:  Diagnosis Date  . Anxiety   . Depression   . Heartburn 09/01/2015   no current med.  Marland Kitchen History of anemia 01/2014  . Non-insulin dependent type 2 diabetes mellitus ([place])   . Obesity   . PTSD (post-traumatic stress disorder)   . Scar of breast 08/2015   left  . Sinus headache     Past Surgical History   Past Surgical History:  Procedure Laterality Date  . BREAST SURGERY    . INCISION AND DRAINAGE ABSCESS Left 10/07/2014   Procedure: INCISION AND DRAINAGE LEFT BREAST ABSCESS;  Surgeon: Jamesetta So, MD;  Location: [facility];  Service: General;  Laterality: Left;  . LAPAROSCOPIC OVARIAN CYSTECTOMY Right 02/05/2014   Procedure: LAPAROSCOPIC right retroperitoneal (NOT OVARIAN) CYSTECTOMY;  Surgeon: Florian Buff, MD;  Location: [facility];  Service: Gynecology;  Laterality: Right;  . MASS EXCISION Left 09/07/2015   Procedure: Minor SCAR EXCISION LEFT BREAST, PLASTIC CLOSURE;  Surgeon: Cristine Polio, MD;  Location: [facility];  Service: Plastics;  Laterality: Left;  . TYMPANOSTOMY TUBE PLACEMENT Bilateral  10/2014    Social History   Social History  Substance Use Topics  . Smoking status: Current Every Day Smoker    Packs/day: 0.50    Years: 7.00    Types: Cigarettes  . Smokeless tobacco: Never Used  . Alcohol use Yes     Comment: occasionally    Medications   Prior to Admission medications   Medication Sig Start Date End Date Taking? Authorizing Provider  albuterol (PROVENTIL HFA;VENTOLIN HFA) 108 (90 Base) MCG/ACT inhaler Inhale 2 puffs into the lungs every 6 (six) hours as needed for wheezing or shortness of breath.   Yes [provider]  clonazePAM (KLONOPIN) 1 MG tablet Take 1 mg by mouth 3 (three) times daily.   Yes [provider]  escitalopram (LEXAPRO) 10 MG tablet Take 10 mg by mouth daily.   Yes [provider]  sitaGLIPtin (JANUVIA) 50 MG tablet Take 50 mg by mouth daily.   Yes [provider]  SUMAtriptan (IMITREX) 50 MG tablet Take 50 mg by mouth every 2 (two) hours as needed for migraine. May repeat in 2 hours if headache persists or recurs.   Yes [provider]  ALPRAZolam Duanne Moron) 1 MG tablet TAKE 1 TABLET BY MOUTH THREE TIMES DAILY Patient not taking: Reported on 06/27/2017 07/26/16   Florian Buff, MD  traMADol (ULTRAM) 50 MG tablet Take 2 tablets (100 mg total) by mouth every 6 (six) hours as needed. Patient not taking: Reported on 06/27/2017 02/23/16   Rolland Porter, MD  venlafaxine XR (EFFEXOR-XR) 150 MG 24 hr capsule TAKE 2 CAPSULES BY MOUTH EVERY DAY Patient not taking: Reported on 06/27/2017 06/09/15   Florian Buff, MD  venlafaxine XR (EFFEXOR-XR) 150 MG 24 hr capsule TAKE 2 CAPSULES BY MOUTH EVERY DAY Patient not taking: Reported on 06/27/2017 10/21/16   Florian Buff, MD    Allergies  No Known Allergies  Review of Systems  ROS  Neurologic Exam  Awake, alert, oriented Memory and concentration grossly intact Speech fluent, appropriate CN grossly intact Motor exam: Upper Extremities Deltoid Bicep Tricep Grip   Right 5/5 5/5 5/5 5/5  Left 5/5 5/5 5/5 5/5   Lower Extremities IP Quad PF DF EHL  Right 5/5 5/5 5/5 5/5 5/5  Left 5/5 5/5 5/5 5/5 5/5   Sensation grossly intact to LT  Imaging  CT scan and MRI of the brain were reviewed. These demonstrate a small defect in the anterior skull base on the right in the region of the cribriform plate, with a small encephalocele.  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  Plan  - Will plan on repairing the anterior skull base encephalocele and dural defect via bifrontal craniotomy and reconstruction of the skull base.  I did review the imaging findings with the patient and her family. I explained to them that the encephalocele is the reason for her CSF leak, and that this will require operative repair. I explained to them the details of the procedure as well as the expected postoperative course. Risks of the surgery were reviewed including the risk of continued CSF leak requiring additional surgeries, loss of taste or smell, stroke, postoperative hematoma, seizure, infection, and hydrocephalus. General risks of anesthesia were also discussed. With an understanding of these risks, the patient and her family would like to proceed with surgical repair. All their questions were answered.

## 2017-06-27 NOTE — Progress Notes (Signed)
Patient states she takes Lexapro 10 mg PO daily and Klonopin 1 mg PO TID PRN for management of her depression and anxiety. Patient reports oral tingling. MD paged. RN will continue to monitor patient.

## 2017-06-27 NOTE — Care Management Note (Signed)
Case Management Note  Patient Details  Name: Kaytlynne Neace MRN: [id number] Date of Birth: May 18, 1986  Subjective/Objective:   Pt admitted with CSF leak. She is from home with her mother.                 Action/Plan: Plan is for her to return home when she is medically stable. CM following for d/c needs, physician orders.  Expected Discharge Date:                  Expected Discharge Plan:  Home/Self Care  In-House Referral:     Discharge planning Services     Post Acute Care Choice:    Choice offered to:     DME Arranged:    DME Agency:     HH Arranged:    HH Agency:     Status of Service:  In process, will continue to follow  If discussed at Long Length of Stay Meetings, dates discussed:    Additional Comments:  Pollie Friar, RN 06/27/2017, 11:56 AM

## 2017-06-27 NOTE — Progress Notes (Signed)
Patient came from [facility] to [facility] to receive lumbar puncture in radiology. Patient had order to contact Radiology and ENT Dr. Benjamine Mola upon arrival.  Dr. Benjamine Mola and radiology paged. Neuro Radiologist, Dr. Dorann Lodge, requested that patient have MRI before lumbar puncture. Orders given for MRI. Still awaiting lumbar puncture.

## 2017-06-27 NOTE — Progress Notes (Signed)
Subjective: Still has clear CSF rhinorrhea. 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  Objective: Vital signs in last 24 hours: Temp:  [98 F (36.7 C)-98.7 F (37.1 C)] 98.1 F (36.7 C) (09/11 1002) Pulse Rate:  [84-97] 88 (09/11 1002) Resp:  [17-18] 18 (09/11 1002) BP: (97-155)/(56-94) 134/78 (09/11 1002) SpO2:  [98 %-100 %] 98 % (09/11 0441) Weight:  [165.1 kg (364 lb)] 165.1 kg (364 lb) (09/10 1752)  General: Communicates without difficulty, morbidly obese, no acute distress.  Head: Normocephalic, no evidence injury, no tenderness, facial buttresses intact without stepoff.  Eyes: PERRL, EOMI. No scleral icterus, conjunctivae clear. Neuro: CN II exam reveals vision grossly intact. No nystagmus at any point of gaze.  Ears: The left TM is intact. The right tube is in place and patent.  Nose: External evaluation reveals normal support and skin without lesions.  Dorsum is intact.  Anterior rhinoscopy reveals congested mucosa over anterior aspect of inferior turbinates and intact septum. Clear fluid noted from right nostril when leaning forward.  Oral:  Oral cavity and oropharynx are intact, symmetric, without erythema or edema.  Mucosa is moist without lesions.  Neck: Full range of motion without pain.  There is no significant lymphadenopathy.  No masses palpable.  Thyroid bed within normal limits to palpation.  Parotid glands and submandibular glands equal bilaterally without mass.  Trachea is midline.  Neuro:  CN 2-12 grossly intact.  The cerebellar examination is unremarkable.    Recent Labs  06/26/17 1925 06/27/17 0916  WBC 17.1* 16.7*  HGB 11.4* 10.8*  HCT 36.2 33.9*  PLT 258 249    Recent Labs  06/26/17 1925 06/27/17 0916  NA 134* 134*  K 3.9 3.3*  CL 101 101  CO2 24 26  GLUCOSE 169* 214*  BUN 7 7  CREATININE 0.87 1.04*  CALCIUM 9.1 9.0    Medications:  I have reviewed the patient's current medications. Scheduled: .  insulin aspart  0-9 Units Subcutaneous TID WC  . nicotine  21 mg Transdermal Daily  . sodium chloride flush  3 mL Intravenous Q12H   Continuous: . sodium chloride      Assessment/Plan: Pt with right frontal encephalocele and CSF leak. No sign of meningitis at this time. Unless neurosurgery can repair the encephalocele and CSF leak from above, will plan to refer the patient to a rhinologist/skull base ENT surgeon at [facility] or [place].   LOS: 1 day   Chayna Surratt W Jesaiah Fabiano 06/27/2017, 12:45 PM

## 2017-06-27 NOTE — Progress Notes (Signed)
Pharmacy Antibiotic Note  Judy Nelson is a 31 y.o. female w/ known CSF leak awaiting repair, admitted on 06/26/2017 with concern for meningitis.  Pharmacy has been consulted for one dose each of Vancocin and Rocephin after LP.  Spoke to RN who states that pt will be going to MRI shortly prior to LP.  Plan: Will give vancomycin 2500mg  IV x1 and Rocephin 2g x1 after LP. Will f/u whether IV ABX to continue.  Height: 5' 7.5" (171.5 cm) Weight: (!) 364 lb (165.1 kg) IBW/kg (Calculated) : 62.75  Temp (24hrs), Avg:98.5 F (36.9 C), Min:98 F (36.7 C), Max:98.7 F (37.1 C)   Recent Labs Lab 06/26/17 1925  WBC 17.1*  CREATININE 0.87    Estimated Creatinine Clearance: 154.8 mL/min (by C-G formula based on SCr of 0.87 mg/dL).    No Known Allergies   Thank you for allowing pharmacy to be a part of this patient's care.  Wynona Neat, PharmD, BCPS  06/27/2017 5:32 AM

## 2017-06-28 ENCOUNTER — Ambulatory Visit (HOSPITAL_COMMUNITY): Payer: Medicaid Other

## 2017-06-28 ENCOUNTER — Other Ambulatory Visit: Payer: Self-pay | Admitting: Neurosurgery

## 2017-06-28 DIAGNOSIS — F419 Anxiety disorder, unspecified: Secondary | ICD-10-CM

## 2017-06-28 DIAGNOSIS — G960 Cerebrospinal fluid leak: Secondary | ICD-10-CM

## 2017-06-28 DIAGNOSIS — Z6841 Body Mass Index (BMI) 40.0 and over, adult: Secondary | ICD-10-CM

## 2017-06-28 DIAGNOSIS — D72829 Elevated white blood cell count, unspecified: Secondary | ICD-10-CM

## 2017-06-28 LAB — CBC
HCT: 32.9 % — ABNORMAL LOW (ref 36.0–46.0)
HEMOGLOBIN: 10.3 g/dL — AB (ref 12.0–15.0)
MCH: 24.2 pg — AB (ref 26.0–34.0)
MCHC: 31.3 g/dL (ref 30.0–36.0)
MCV: 77.4 fL — AB (ref 78.0–100.0)
PLATELETS: 243 10*3/uL (ref 150–400)
RBC: 4.25 MIL/uL (ref 3.87–5.11)
RDW: 15.3 % (ref 11.5–15.5)
WBC: 18 10*3/uL — ABNORMAL HIGH (ref 4.0–10.5)

## 2017-06-28 LAB — BASIC METABOLIC PANEL
Anion gap: 7 (ref 5–15)
BUN: 7 mg/dL (ref 6–20)
CALCIUM: 9.1 mg/dL (ref 8.9–10.3)
CO2: 28 mmol/L (ref 22–32)
Chloride: 101 mmol/L (ref 101–111)
Creatinine, Ser: 0.98 mg/dL (ref 0.44–1.00)
GFR calc Af Amer: >60 mL/min (ref >60)
Glucose, Bld: 154 mg/dL — ABNORMAL HIGH (ref 65–99)
POTASSIUM: 3.5 mmol/L (ref 3.5–5.1)
Sodium: 136 mmol/L (ref 135–145)

## 2017-06-28 LAB — GLUCOSE, CAPILLARY
Glucose-Capillary: 177 mg/dL — ABNORMAL HIGH (ref 65–99)
Glucose-Capillary: 192 mg/dL — ABNORMAL HIGH (ref 65–99)
Glucose-Capillary: 164 mg/dL — ABNORMAL HIGH (ref 65–99)
Glucose-Capillary: 128 mg/dL — ABNORMAL HIGH (ref 65–99)

## 2017-06-28 MED ORDER — CLONAZEPAM 1 MG PO TABS: 1.0000 mg | ORAL_TABLET | Freq: Three times a day (TID) | ORAL | Status: DC

## 2017-06-28 MED ORDER — ACETAMINOPHEN 325 MG PO TABS: 650.0000 mg | ORAL_TABLET | Freq: Four times a day (QID) | ORAL | Status: DC | PRN

## 2017-06-28 MED ORDER — TRAMADOL HCL 50 MG PO TABS
50.0000 mg | ORAL_TABLET | Freq: Four times a day (QID) | ORAL | Status: DC | PRN
  Administered 2017-06-28 – 2017-07-02 (×8): 50 mg via ORAL
  Filled 2017-06-28 (×8): qty 1

## 2017-06-28 NOTE — Progress Notes (Signed)
[facility]                                                                              Patient Demographics  Judy Nelson, is a 31 y.o. female, DOB - September 04, 1986, NOM:[id number]  Admit date - 06/26/2017   Admitting Physician Phillips Grout, MD  Outpatient Primary MD for the patient is Practice, [facility]  Outpatient specialists:   LOS - 2  days   Medical records reviewed and are as summarized below:    Chief Complaint  Patient presents with  . Neck Pain       Brief summary   HPI On 06/26/17 by Dr. Alver Fisher a 31 y.o.femalewith medical history significant of DM,obesity with CSF leak from her nose for about 6 weeks now. Pt sees dr Benjamine Mola. He did an analysis of the fluid in the last week or so and determined it was a csf. No records in EPIC. All history taken from patient with some difficulty as she is not happy about her current situation. She says an MRI was attempted but she was too big for the machine. It has been mentioned to her she needs an open MRI. Repair of the leak has not been set up yet. She called dr Benjamine Mola office today to report a headache, a week of fevers (today was 102) and neck pain who instructed her to come to ED. She has been on nasal steroids but none orally. She says the fluid is filling up in her lungs and makes her sob and coughing a lot. She reports when she lies down the fluid from the leak "are filling up my lungs". Pt has no h/o chf. Dr Benjamine Mola was called who advised pt get an LP.      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 Trace sphenoid sinus and ear fluid levels, clinical   For CSF rhinorrhea - ENT, Dr. Benjamine Mola was consulted, and neurosurgery consulted - No signs of meningitis - Per neurosurgery, Dr. Kathyrn Sheriff, patient will need bifrontal craniotomy and reconstruction of the skull base for repairing the anterior skull base encephalocele and dural defect. Planned surgery for Friday afternoon. - Continue pain control for headache    Morbid obesity with BMI of 50.0-59.9, adult ([place]) - Patient counseled on diet and weight control, BMI 57     Type 2 diabetes mellitus without complication ([place]) - Continue to hold metformin, continue sliding scale insulin, CBGs     Leukocytosis - Likely reactive, no meningitis    Anxiety - Continue Xanax as needed  Tobacco abuse - Continue nicotine patch, counseled on smoking cessation   Code Status: full  DVT Prophylaxis:   SCD's Family Communication: Discussed in detail with the patient, all imaging results, lab results explained to the patient   Disposition Plan:   Time Spent in minutes  25 minutes  Procedures:  None   Consultants:   ENT Neurosurgery   Antimicrobials :      Medications  Scheduled Meds: . insulin aspart  0-9 Units Subcutaneous TID WC  . nicotine  21 mg Transdermal Daily  . sodium chloride flush  3 mL Intravenous Q12H   Continuous Infusions: . sodium chloride     PRN Meds:.sodium chloride, acetaminophen, ALPRAZolam, sodium chloride flush, traMADol   Antibiotics   Anti-infectives    None        Subjective:   Judy Nelson was seen and examined today.Complaining of headache, no neck pain. No fevers or chills.  Patient denies dizziness, chest pain, shortness of breath, abdominal pain, N/V/D/C, new weakness, numbess, tingling. No acute events overnight.    Objective:   Vitals:   06/27/17 2127 06/28/17 0100 06/28/17 0521 06/28/17 0817  BP: 102/60 115/72 121/76 126/85  Pulse: 97 85 74 80  Resp: 18 17 18 16   Temp: 98.6 F (37 C) 98.5 F (36.9 C) 98.5 F (36.9 C) 97.7 F  (36.5 C)  TempSrc: Oral Oral Oral Oral  SpO2: 99% 100% 100% 100%  Weight:   (!) 163.7 kg (361 lb)   Height:        Intake/Output Summary (Last 24 hours) at 06/28/17 1128 Last data filed at 06/28/17 0700  Gross per 24 hour  Intake             1003 ml  Output                0 ml  Net             1003 ml     Wt Readings from Last 3 Encounters:  06/28/17 (!) 163.7 kg (361 lb)  06/26/16 (!) 172.4 kg (380 lb)  04/13/16 (!) 172.4 kg (380 lb)     Exam  General: Alert and oriented x 3, NAD  Eyes: PERRLA, EOMI, Anicteric Sclera,  HEENT:  Atraumatic, normocephalic, normal oropharynx  Cardiovascular: S1 S2 auscultated, no rubs, murmurs or gallops. Regular rate and rhythm.  Respiratory: Clear to auscultation bilaterally, no wheezing, rales or rhonchi  Gastrointestinal: Soft, nontender, nondistended, + bowel sounds  Ext: no pedal edema bilaterally  Neuro: AAOx3, Cr N's II- XII. Strength 5/5 upper and lower extremities bilaterally, speech clear, sensations grossly intact  Musculoskeletal: No digital cyanosis, clubbing  Skin: No rashes  Psych: Normal affect and demeanor, alert and oriented x3    Data Reviewed:  I have personally reviewed following labs and imaging studies  Micro Results Recent Results (from the past 240 hour(s))  Blood culture (routine x 2)     Status: None (Preliminary result)   Collection Time: 06/26/17  8:52 PM  Result Value Ref Range Status   Specimen Description BLOOD RIGHT ARM  Final   Special Requests   Final    BOTTLES DRAWN AEROBIC AND ANAEROBIC Blood Culture adequate volume   Culture NO GROWTH 2 DAYS  Final   Report Status PENDING  Incomplete  Blood culture (routine x 2)     Status: None (Preliminary result)   Collection Time: 06/26/17  8:55 PM  Result Value Ref Range Status   Specimen Description BLOOD RIGHT ARM  Final   Special Requests   Final    BOTTLES DRAWN AEROBIC AND ANAEROBIC Blood Culture adequate volume   Culture NO GROWTH 2  DAYS  Final   Report Status PENDING  Incomplete    Radiology Reports Dg Chest 2 View  Result  Date: 06/26/2017 CLINICAL DATA:  Fever, headache and known CSF leak. EXAM: CHEST  2 VIEW COMPARISON:  None. FINDINGS: The cardiac silhouette, mediastinal and hilar contours are within normal limits. The lungs are grossly clear. No pleural effusion. The bony thorax is intact. IMPRESSION: No acute cardiopulmonary findings. Electronically Signed   By: Marijo Sanes M.D.   On: 06/26/2017 21:36   Mr Jeri Cos HK Contrast  Result Date: 06/27/2017 CLINICAL DATA:  CSF leak awaiting repair. Fever. Assess for meningitis. EXAM: MRI HEAD WITHOUT AND WITH CONTRAST TECHNIQUE: Multiplanar, multiecho pulse sequences of the brain and surrounding structures were obtained without and with intravenous contrast. CONTRAST:  33mL MULTIHANCE GADOBENATE DIMEGLUMINE 529 MG/ML IV SOLN COMPARISON:  Maxillofacial CT June 23, 2017 FINDINGS: INTRACRANIAL CONTENTS: Small RIGHT frontal encephalocele through the cribriform defect as seen on prior CT, extending into the RIGHT ethmoid air cells. No vasogenic edema. No reduced diffusion to suggest acute ischemia or typical infection. No susceptibility artifact to suggest hemorrhage. The ventricles and sulci are normal for patient's age. No suspicious parenchymal signal, masses, mass effect. No abnormal intraparenchymal or extra-axial enhancement. Preserved prepontine cistern. No abnormal extra-axial fluid collections. No extra-axial masses. VASCULAR: Normal major intracranial vascular flow voids present at skull base. SKULL AND UPPER CERVICAL SPINE: Partially empty sella. No suspicious calvarial bone marrow signal. Craniocervical junction maintained. Multiple scalp nodules seen with sebaceous or trichilemmal cysts. SINUSES/ORBITS: LEFT mastoid effusion. Trace sphenoid sinus air-fluid levels. The included ocular globes and orbital contents are non-suspicious. OTHER: None. IMPRESSION: 1. No acute  intracranial process. No MR findings of meningitis or intracranial hypotension. 2. 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 Trace sphenoid sinus air-fluid levels equivocal for CSF rhinorrhea. 3. Partially empty sella. Electronically Signed   By: Elon Alas M.D.   On: 06/27/2017 06:50   Ct Maxillofacial Wo Contrast  Result Date: 06/23/2017 CLINICAL DATA:  Rhinorrhea when tilting the head forward. Nighttime drainage. EXAM: CT MAXILLOFACIAL WITHOUT CONTRAST TECHNIQUE: Multidetector CT imaging of the maxillofacial structures was performed. Multiplanar CT image reconstructions were also generated. COMPARISON:  None. FINDINGS: Frontal sinuses are clear and normal. Maxillary sinuses are clear and normal. Sphenoid sinus is clear and normal. Left ethmoid sinuses are clear and normal. There is abnormality of the posterosuperior ethmoid sinus region with a 2.7 x 2.0 x 1.5 cm region of air cell opacification. There appears to be bone deficiency at the skullbase seen communication with these. There may also be some bone deficiency along the lateral margin of the olfactory recess on the right. Therefore, this could be CSF rhinorrhea. The bony at openings appear diminutive, and I think it is less likely that this is a true encephalocele, but that is not excluded. Consider MRI I to evaluate that. Orbits appear normal. Globes are normal. Optic nerves are normal. Extra-ocular muscles are normal. Other soft tissues of the face are normal. Salivary glands are normal. No pharyngeal lesion is seen. Upper cervical spine is unremarkable. No brain lesion is evident. The sella is prominent. This could go along with increased intracranial pressure. IMPRESSION: Abnormality in the posterosuperior ethmoid region on the right with a 2.7 x 2.0 x 1.5 cm region of sinus opacification. Bone defects at the skullbase and along the lateral margin of  the olfactory groove on the right. Therefore, this could be CSF leak age with CSF rhinorrhea. True encephalocele is doubted but not excluded. Consider MRI. Electronically Signed   By: Nelson Chimes  M.D.   On: 06/23/2017 12:02    Lab Data:  CBC:  Recent Labs Lab 06/26/17 1925 06/27/17 0916 06/28/17 0313  WBC 17.1* 16.7* 18.0*  NEUTROABS 13.3*  --   --   HGB 11.4* 10.8* 10.3*  HCT 36.2 33.9* 32.9*  MCV 78.4 77.8* 77.4*  PLT 258 249 433   Basic Metabolic Panel:  Recent Labs Lab 06/26/17 1925 06/27/17 0916 06/28/17 0313  NA 134* 134* 136  K 3.9 3.3* 3.5  CL 101 101 101  CO2 24 26 28   GLUCOSE 169* 214* 154*  BUN 7 7 7   CREATININE 0.87 1.04* 0.98  CALCIUM 9.1 9.0 9.1   GFR: Estimated Creatinine Clearance: 136.8 mL/min (by C-G formula based on SCr of 0.98 mg/dL). Liver Function Tests: No results for input(s): AST, ALT, ALKPHOS, BILITOT, PROT, ALBUMIN in the last 168 hours. No results for input(s): LIPASE, AMYLASE in the last 168 hours. No results for input(s): AMMONIA in the last 168 hours. Coagulation Profile: No results for input(s): INR, PROTIME in the last 168 hours. Cardiac Enzymes: No results for input(s): CKTOTAL, CKMB, CKMBINDEX, TROPONINI in the last 168 hours. BNP (last 3 results) No results for input(s): PROBNP in the last 8760 hours. HbA1C: No results for input(s): HGBA1C in the last 72 hours. CBG:  Recent Labs Lab 06/27/17 1123 06/27/17 1622 06/27/17 2134 06/28/17 0643 06/28/17 1110  GLUCAP 149* 192* 173* 177* 192*   Lipid Profile: No results for input(s): CHOL, HDL, LDLCALC, TRIG, CHOLHDL, LDLDIRECT in the last 72 hours. Thyroid Function Tests: No results for input(s): TSH, T4TOTAL, FREET4, T3FREE, THYROIDAB in the last 72 hours. Anemia Panel: No results for input(s): VITAMINB12, FOLATE, FERRITIN, TIBC, IRON, RETICCTPCT in the last 72 hours. Urine analysis:    Component Value Date/Time   COLORURINE YELLOW 06/26/2017 2230   APPEARANCEUR  CLOUDY (A) 06/26/2017 2230   LABSPEC 1.023 06/26/2017 2230   PHURINE 5.0 06/26/2017 2230   GLUCOSEU NEGATIVE 06/26/2017 2230   HGBUR NEGATIVE 06/26/2017 2230   BILIRUBINUR NEGATIVE 06/26/2017 2230   KETONESUR NEGATIVE 06/26/2017 2230   PROTEINUR 30 (A) 06/26/2017 2230   UROBILINOGEN 0.2 10/05/2014 1300   NITRITE NEGATIVE 06/26/2017 2230   LEUKOCYTESUR LARGE (A) 06/26/2017 2230     Ripudeep Rai M.D. [facility] 06/28/2017, 11:28 AM  Pager: [phone] Between 7am to 7pm - call Pager - [phone]  After 7pm go to [email or web address] - password [email or web address]  Call night coverage person covering after 7pm

## 2017-06-28 NOTE — Progress Notes (Signed)
Patient c/o 8/10 constant h/a behind right eye. Patient reports large, continuous amount of clear, thin nasal drainage with head movement.TRH paged for medication for pain management. RN will continue to monitor.

## 2017-06-29 DIAGNOSIS — F4310 Post-traumatic stress disorder, unspecified: Secondary | ICD-10-CM

## 2017-06-29 DIAGNOSIS — E119 Type 2 diabetes mellitus without complications: Secondary | ICD-10-CM

## 2017-06-29 DIAGNOSIS — M542 Cervicalgia: Secondary | ICD-10-CM

## 2017-06-29 LAB — GLUCOSE, CAPILLARY
GLUCOSE-CAPILLARY: 210 mg/dL — AB (ref 65–99)
Glucose-Capillary: 137 mg/dL — ABNORMAL HIGH (ref 65–99)
Glucose-Capillary: 169 mg/dL — ABNORMAL HIGH (ref 65–99)
Glucose-Capillary: 213 mg/dL — ABNORMAL HIGH (ref 65–99)

## 2017-06-29 LAB — SURGICAL PCR SCREEN
MRSA, PCR: NEGATIVE
Staphylococcus aureus: NEGATIVE

## 2017-06-29 MED ORDER — CEFAZOLIN SODIUM-DEXTROSE 2-4 GM/100ML-% IV SOLN
2.0000 g | INTRAVENOUS | Status: AC
  Administered 2017-06-30: 3 g via INTRAVENOUS
  Filled 2017-06-29 (×2): qty 100

## 2017-06-29 MED ORDER — CHLORHEXIDINE GLUCONATE CLOTH 2 % EX PADS: 6.0000 | MEDICATED_PAD | Freq: Once | CUTANEOUS | Status: DC

## 2017-06-29 NOTE — Progress Notes (Signed)
No issues overnight. Cont to have positional HA. No other complaints.  EXAM:  BP 132/84 (BP Location: Left Arm)   Pulse 100   Temp 99 F (37.2 C) (Oral)   Resp 20   Ht 5' 7.5" (1.715 m)   Wt (!) 164.7 kg (363 lb 1.6 oz)   LMP 06/02/2017   SpO2 100%   BMI 56.03 kg/m   Awake, alert, oriented  Speech fluent, appropriate  CN grossly intact  5/5 BUE/BLE   IMPRESSION:  31 y.o. female with anterior skull base encephalocele and CSF leak  PLAN: - OR tomorrow, ~12:45 for repair of encephalocele. - NPO p MN  I reviewed again the surgery, expected postop course, and risks of the surgery with the patient. All questions were answered and she is ready to proceed.

## 2017-06-29 NOTE — Progress Notes (Signed)
Patient was very anxious at 2000. Patient verbalized concerns about not knowing the time and date of surgery. Patient informed of scheduled surgery on 06/30/17 at 12:43 pm which provided small amount of anxiety relief. RN will continue to monitor.

## 2017-06-29 NOTE — Progress Notes (Signed)
Patient c/o black line/field cut in her right eye. Patient states now that she thinks about it the spot always comes prior to h/a. MD notified and nursing staff will administer tramadol for h/a. RN will continue to monitor.

## 2017-06-29 NOTE — Progress Notes (Signed)
Consent signed and place in chart.   Lashawndra Lampkins, RN 

## 2017-06-29 NOTE — Progress Notes (Signed)
[facility]                                                                              Patient Demographics  Judy Nelson, is a 31 y.o. female, DOB - 1986-07-20, NIO:[id number]  Admit date - 06/26/2017   Admitting Physician Phillips Grout, MD  Outpatient Primary MD for the patient is Practice, [facility]  Outpatient specialists:   LOS - 3  days   Medical records reviewed and are as summarized below:    Chief Complaint  Patient presents with  . Neck Pain       Brief summary   HPI On 06/26/17 by Dr. Alver Fisher a 31 y.o.femalewith medical history significant of DM,obesity with CSF leak from her nose for about 6 weeks now. Pt sees dr Benjamine Mola. He did an analysis of the fluid in the last week or so and determined it was a csf. No records in EPIC. All history taken from patient with some difficulty as she is not happy about her current situation. She says an MRI was attempted but she was too big for the machine. It has been mentioned to her she needs an open MRI. Repair of the leak has not been set up yet. She called dr Benjamine Mola office today to report a headache, a week of fevers (today was 102) and neck pain who instructed her to come to ED. She has been on nasal steroids but none orally. She says the fluid is filling up in her lungs and makes her sob and coughing a lot. She reports when she lies down the fluid from the leak "are filling up my lungs". Pt has no h/o chf. Dr Benjamine Mola was called who advised pt get an LP.      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 Trace sphenoid sinus and ear fluid levels, clinical  for CSF rhinorrhea - ENT, Dr. Benjamine Mola was consulted, and neurosurgery consulted. No signs of meningitis - Per neurosurgery, Dr. Kathyrn Sheriff, patient will need bifrontal craniotomy and reconstruction of the skull base for repairing the anterior skull base encephalocele and dural defect. Planned surgery on 9/14 at 12:43 PM   - Continue pain control for headache, NPO after midnight    Morbid obesity with BMI of 50.0-59.9, adult ([place]) - Patient counseled on diet and weight control, BMI 57     Type 2 diabetes mellitus without complication ([place]) - Continue to hold metformin, continue sliding scale insulin, CBGs     Leukocytosis - Likely reactive, no meningitis    Anxiety - Continue Xanax as needed  Tobacco abuse - Continue nicotine patch, counseled on smoking cessation   Code Status: full  DVT Prophylaxis:   SCD's Family Communication: Discussed in detail with the patient, all imaging results, lab results explained to the patient   Disposition Plan:  Time Spent in minutes  25 minutes  Procedures:  None   Consultants:   ENT Neurosurgery   Antimicrobials :      Medications  Scheduled Meds: . insulin aspart  0-9 Units Subcutaneous TID WC  . nicotine  21 mg Transdermal Daily  . sodium chloride flush  3 mL Intravenous Q12H   Continuous Infusions: . sodium chloride     PRN Meds:.sodium chloride, acetaminophen, ALPRAZolam, sodium chloride flush, traMADol   Antibiotics   Anti-infectives    None        Subjective:   Philena Obey was seen and examined today.Headache with right eye blurring today am. No fevers or chills.  Patient denies dizziness, chest pain, shortness of breath, abdominal pain, N/V/D/C, new weakness, numbess, tingling.  Objective:   Vitals:   06/29/17 0600 06/29/17 0946 06/29/17 1016 06/29/17 1326  BP: 114/81 112/61 113/64 132/84  Pulse: 78 78 78 100  Resp: 20 20 20 20   Temp: 97.7 F (36.5 C) 98.6 F (37 C) 98.6 F (37 C) 99 F (37.2 C)    TempSrc: Oral Oral Oral Oral  SpO2: 95% 98% 98% 100%  Weight:      Height:        Intake/Output Summary (Last 24 hours) at 06/29/17 1346 Last data filed at 06/29/17 0900  Gross per 24 hour  Intake              243 ml  Output                0 ml  Net              243 ml     Wt Readings from Last 3 Encounters:  06/29/17 (!) 164.7 kg (363 lb 1.6 oz)  06/26/16 (!) 172.4 kg (380 lb)  04/13/16 (!) 172.4 kg (380 lb)     Exam General: Alert and oriented x 3, NAD Eyes:  HEENT:  Atraumatic, normocephalic Cardiovascular: S1 S2 auscultated, no rubs, murmurs or gallops. Regular rate and rhythm. No pedal edema b/l Respiratory: Clear to auscultation bilaterally, no wheezing, rales or rhonchi Gastrointestinal: Soft, nontender, nondistended, + bowel sounds Ext: no pedal edema bilaterally Neuro: no new deficits Musculoskeletal: No digital cyanosis, clubbing Skin: No rashes Psych: Normal affect and demeanor, alert and oriented x3      Data Reviewed:  I have personally reviewed following labs and imaging studies  Micro Results Recent Results (from the past 240 hour(s))  Blood culture (routine x 2)     Status: None (Preliminary result)   Collection Time: 06/26/17  8:52 PM  Result Value Ref Range Status   Specimen Description BLOOD RIGHT ARM  Final   Special Requests   Final    BOTTLES DRAWN AEROBIC AND ANAEROBIC Blood Culture adequate volume   Culture NO GROWTH 3 DAYS  Final   Report Status PENDING  Incomplete  Blood culture (routine x 2)     Status: None (Preliminary result)   Collection Time: 06/26/17  8:55 PM  Result Value Ref Range Status   Specimen Description BLOOD RIGHT ARM  Final   Special Requests   Final    BOTTLES DRAWN AEROBIC AND ANAEROBIC Blood Culture adequate volume   Culture NO GROWTH 3 DAYS  Final   Report Status PENDING  Incomplete    Radiology Reports Dg Chest 2 View  Result Date: 06/26/2017 CLINICAL DATA:  Fever, headache and known CSF leak. EXAM:  CHEST  2 VIEW COMPARISON:  None. FINDINGS: The cardiac silhouette,  mediastinal and hilar contours are within normal limits. The lungs are grossly clear. No pleural effusion. The bony thorax is intact. IMPRESSION: No acute cardiopulmonary findings. Electronically Signed   By: Marijo Sanes M.D.   On: 06/26/2017 21:36   Mr Jeri Cos PJ Contrast  Result Date: 06/27/2017 CLINICAL DATA:  CSF leak awaiting repair. Fever. Assess for meningitis. EXAM: MRI HEAD WITHOUT AND WITH CONTRAST TECHNIQUE: Multiplanar, multiecho pulse sequences of the brain and surrounding structures were obtained without and with intravenous contrast. CONTRAST:  36mL MULTIHANCE GADOBENATE DIMEGLUMINE 529 MG/ML IV SOLN COMPARISON:  Maxillofacial CT June 23, 2017 FINDINGS: INTRACRANIAL CONTENTS: Small RIGHT frontal encephalocele through the cribriform defect as seen on prior CT, extending into the RIGHT ethmoid air cells. No vasogenic edema. No reduced diffusion to suggest acute ischemia or typical infection. No susceptibility artifact to suggest hemorrhage. The ventricles and sulci are normal for patient's age. No suspicious parenchymal signal, masses, mass effect. No abnormal intraparenchymal or extra-axial enhancement. Preserved prepontine cistern. No abnormal extra-axial fluid collections. No extra-axial masses. VASCULAR: Normal major intracranial vascular flow voids present at skull base. SKULL AND UPPER CERVICAL SPINE: Partially empty sella. No suspicious calvarial bone marrow signal. Craniocervical junction maintained. Multiple scalp nodules seen with sebaceous or trichilemmal cysts. SINUSES/ORBITS: LEFT mastoid effusion. Trace sphenoid sinus air-fluid levels. The included ocular globes and orbital contents are non-suspicious. OTHER: None. IMPRESSION: 1. No acute intracranial process. No MR findings of meningitis or intracranial hypotension. 2. 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 Trace sphenoid sinus air-fluid levels equivocal for CSF rhinorrhea. 3. Partially empty sella. Electronically Signed   By: Elon Alas M.D.   On: 06/27/2017 06:50   Ct Maxillofacial Wo Contrast  Result Date: 06/23/2017 CLINICAL DATA:  Rhinorrhea when tilting the head forward. Nighttime drainage. EXAM: CT MAXILLOFACIAL WITHOUT CONTRAST TECHNIQUE: Multidetector CT imaging of the maxillofacial structures was performed. Multiplanar CT image reconstructions were also generated. COMPARISON:  None. FINDINGS: Frontal sinuses are clear and normal. Maxillary sinuses are clear and normal. Sphenoid sinus is clear and normal. Left ethmoid sinuses are clear and normal. There is abnormality of the posterosuperior ethmoid sinus region with a 2.7 x 2.0 x 1.5 cm region of air cell opacification. There appears to be bone deficiency at the skullbase seen communication with these. There may also be some bone deficiency along the lateral margin of the olfactory recess on the right. Therefore, this could be CSF rhinorrhea. The bony at openings appear diminutive, and I think it is less likely that this is a true encephalocele, but that is not excluded. Consider MRI I to evaluate that. Orbits appear normal. Globes are normal. Optic nerves are normal. Extra-ocular muscles are normal. Other soft tissues of the face are normal. Salivary glands are normal. No pharyngeal lesion is seen. Upper cervical spine is unremarkable. No brain lesion is evident. The sella is prominent. This could go along with increased intracranial pressure. IMPRESSION: Abnormality in the posterosuperior ethmoid region on the right with a 2.7 x 2.0 x 1.5 cm region of sinus opacification. Bone defects at the skullbase and along the lateral margin of the olfactory groove on the right. Therefore, this could be CSF leak age with CSF rhinorrhea. True encephalocele is doubted but not excluded. Consider MRI. Electronically  Signed   By: Nelson Chimes M.D.   On: 06/23/2017 12:02    Lab Data:  CBC:  Recent Labs Lab 06/26/17 1925 06/27/17 8250 06/28/17 5397  WBC 17.1* 16.7* 18.0*  NEUTROABS 13.3*  --   --   HGB 11.4* 10.8* 10.3*  HCT 36.2 33.9* 32.9*  MCV 78.4 77.8* 77.4*  PLT 258 249 921   Basic Metabolic Panel:  Recent Labs Lab 06/26/17 1925 06/27/17 0916 06/28/17 0313  NA 134* 134* 136  K 3.9 3.3* 3.5  CL 101 101 101  CO2 24 26 28   GLUCOSE 169* 214* 154*  BUN 7 7 7   CREATININE 0.87 1.04* 0.98  CALCIUM 9.1 9.0 9.1   GFR: Estimated Creatinine Clearance: 137.3 mL/min (by C-G formula based on SCr of 0.98 mg/dL). Liver Function Tests: No results for input(s): AST, ALT, ALKPHOS, BILITOT, PROT, ALBUMIN in the last 168 hours. No results for input(s): LIPASE, AMYLASE in the last 168 hours. No results for input(s): AMMONIA in the last 168 hours. Coagulation Profile: No results for input(s): INR, PROTIME in the last 168 hours. Cardiac Enzymes: No results for input(s): CKTOTAL, CKMB, CKMBINDEX, TROPONINI in the last 168 hours. BNP (last 3 results) No results for input(s): PROBNP in the last 8760 hours. HbA1C: No results for input(s): HGBA1C in the last 72 hours. CBG:  Recent Labs Lab 06/28/17 1110 06/28/17 1609 06/28/17 2220 06/29/17 0705 06/29/17 1123  GLUCAP 192* 164* 128* 137* 169*   Lipid Profile: No results for input(s): CHOL, HDL, LDLCALC, TRIG, CHOLHDL, LDLDIRECT in the last 72 hours. Thyroid Function Tests: No results for input(s): TSH, T4TOTAL, FREET4, T3FREE, THYROIDAB in the last 72 hours. Anemia Panel: No results for input(s): VITAMINB12, FOLATE, FERRITIN, TIBC, IRON, RETICCTPCT in the last 72 hours. Urine analysis:    Component Value Date/Time   COLORURINE YELLOW 06/26/2017 2230   APPEARANCEUR CLOUDY (A) 06/26/2017 2230   LABSPEC 1.023 06/26/2017 2230   PHURINE 5.0 06/26/2017 2230   GLUCOSEU NEGATIVE 06/26/2017 2230   HGBUR NEGATIVE 06/26/2017 2230   BILIRUBINUR  NEGATIVE 06/26/2017 2230   KETONESUR NEGATIVE 06/26/2017 2230   PROTEINUR 30 (A) 06/26/2017 2230   UROBILINOGEN 0.2 10/05/2014 1300   NITRITE NEGATIVE 06/26/2017 2230   LEUKOCYTESUR LARGE (A) 06/26/2017 2230     Ripudeep Rai M.D. [facility] 06/29/2017, 1:46 PM  Pager: [phone] Between 7am to 7pm - call Pager - [phone]  After 7pm go to [email or web address] - password [email or web address]  Call night coverage person covering after 7pm

## 2017-06-30 ENCOUNTER — Inpatient Hospital Stay (HOSPITAL_COMMUNITY): Payer: Medicaid Other | Admitting: Certified Registered"

## 2017-06-30 ENCOUNTER — Encounter (HOSPITAL_COMMUNITY): Payer: Self-pay | Admitting: Anesthesiology

## 2017-06-30 ENCOUNTER — Encounter (HOSPITAL_COMMUNITY)
Admission: EM | Disposition: A | Discharge status: Home Health | Payer: Self-pay | Source: Home / Self Care | Attending: Internal Medicine

## 2017-06-30 HISTORY — PX: CRANIOTOMY: SHX93

## 2017-06-30 LAB — BASIC METABOLIC PANEL
Anion gap: 6 (ref 5–15)
BUN: 9 mg/dL (ref 6–20)
CHLORIDE: 102 mmol/L (ref 101–111)
CO2: 26 mmol/L (ref 22–32)
CREATININE: 0.81 mg/dL (ref 0.44–1.00)
Calcium: 8.8 mg/dL — ABNORMAL LOW (ref 8.9–10.3)
GFR calc Af Amer: >60 mL/min (ref >60)
GFR calc non Af Amer: >60 mL/min (ref >60)
GLUCOSE: 173 mg/dL — AB (ref 65–99)
POTASSIUM: 3.8 mmol/L (ref 3.5–5.1)
Sodium: 134 mmol/L — ABNORMAL LOW (ref 135–145)

## 2017-06-30 LAB — ABO/RH: ABO/RH(D): O POS

## 2017-06-30 LAB — TYPE AND SCREEN
ABO/RH(D): O POS
Antibody Screen: NEGATIVE

## 2017-06-30 LAB — GLUCOSE, CAPILLARY
GLUCOSE-CAPILLARY: 174 mg/dL — AB (ref 65–99)
GLUCOSE-CAPILLARY: 153 mg/dL — AB (ref 65–99)
GLUCOSE-CAPILLARY: 177 mg/dL — AB (ref 65–99)
Glucose-Capillary: 233 mg/dL — ABNORMAL HIGH (ref 65–99)

## 2017-06-30 LAB — CBC
HEMATOCRIT: 34 % — AB (ref 36.0–46.0)
HEMOGLOBIN: 10.6 g/dL — AB (ref 12.0–15.0)
MCH: 24.3 pg — AB (ref 26.0–34.0)
MCHC: 31.2 g/dL (ref 30.0–36.0)
MCV: 78 fL (ref 78.0–100.0)
Platelets: 255 10*3/uL (ref 150–400)
RBC: 4.36 MIL/uL (ref 3.87–5.11)
RDW: 15.1 % (ref 11.5–15.5)
WBC: 13.8 10*3/uL — ABNORMAL HIGH (ref 4.0–10.5)

## 2017-06-30 SURGERY — CRANIOTOMY REPAIR DURAL/CENTRAL SPINAL FLUID LEAK
Anesthesia: General | Site: Head

## 2017-06-30 MED ORDER — HYDROCODONE-ACETAMINOPHEN 5-325 MG PO TABS
1.0000 | ORAL_TABLET | ORAL | Status: DC | PRN
  Administered 2017-06-30 – 2017-07-02 (×5): 1 via ORAL
  Filled 2017-06-30 (×5): qty 1

## 2017-06-30 MED ORDER — ONDANSETRON HCL 4 MG PO TABS: 4.0000 mg | ORAL_TABLET | ORAL | Status: DC | PRN

## 2017-06-30 MED ORDER — ALBUTEROL SULFATE HFA 108 (90 BASE) MCG/ACT IN AERS: 2.0000 | INHALATION_SPRAY | Freq: Four times a day (QID) | RESPIRATORY_TRACT | Status: DC | PRN

## 2017-06-30 MED ORDER — PROMETHAZINE HCL 12.5 MG PO TABS
12.5000 mg | ORAL_TABLET | ORAL | Status: DC | PRN
  Filled 2017-06-30: qty 2

## 2017-06-30 MED ORDER — ESCITALOPRAM OXALATE 10 MG PO TABS
10.0000 mg | ORAL_TABLET | Freq: Every day | ORAL | Status: DC
  Administered 2017-06-30 – 2017-07-03 (×4): 10 mg via ORAL
  Filled 2017-06-30 (×4): qty 1

## 2017-06-30 MED ORDER — FENTANYL CITRATE (PF) 250 MCG/5ML IJ SOLN
INTRAMUSCULAR | Status: AC
  Filled 2017-06-30: qty 5

## 2017-06-30 MED ORDER — BUPIVACAINE HCL (PF) 0.5 % IJ SOLN
INTRAMUSCULAR | Status: AC
  Filled 2017-06-30: qty 30

## 2017-06-30 MED ORDER — HEMOSTATIC AGENTS (NO CHARGE) OPTIME
TOPICAL | Status: DC | PRN
  Administered 2017-06-30: 1 via TOPICAL

## 2017-06-30 MED ORDER — ONDANSETRON HCL 4 MG/2ML IJ SOLN
4.0000 mg | INTRAMUSCULAR | Status: DC | PRN
  Administered 2017-06-30 – 2017-07-02 (×2): 4 mg via INTRAVENOUS
  Filled 2017-06-30 (×2): qty 2

## 2017-06-30 MED ORDER — THROMBIN 20000 UNITS EX SOLR
CUTANEOUS | Status: DC | PRN
  Administered 2017-06-30: 18:00:00 via TOPICAL

## 2017-06-30 MED ORDER — DEXTROSE 5 % IV SOLN
3.0000 g | Freq: Three times a day (TID) | INTRAVENOUS | Status: AC
  Administered 2017-07-01 (×2): 3 g via INTRAVENOUS
  Filled 2017-06-30 (×2): qty 3000

## 2017-06-30 MED ORDER — LIDOCAINE-EPINEPHRINE 1 %-1:100000 IJ SOLN
INTRAMUSCULAR | Status: DC | PRN
  Administered 2017-06-30: 10 mL

## 2017-06-30 MED ORDER — SUMATRIPTAN SUCCINATE 50 MG PO TABS
50.0000 mg | ORAL_TABLET | ORAL | Status: DC | PRN
  Administered 2017-07-02: 50 mg via ORAL
  Filled 2017-06-30 (×2): qty 1

## 2017-06-30 MED ORDER — ROCURONIUM BROMIDE 10 MG/ML (PF) SYRINGE
PREFILLED_SYRINGE | INTRAVENOUS | Status: DC | PRN
  Administered 2017-06-30: 20 mg via INTRAVENOUS
  Administered 2017-06-30: 30 mg via INTRAVENOUS
  Administered 2017-06-30: 50 mg via INTRAVENOUS
  Administered 2017-06-30 (×2): 20 mg via INTRAVENOUS
  Administered 2017-06-30: 50 mg via INTRAVENOUS

## 2017-06-30 MED ORDER — BACITRACIN ZINC 500 UNIT/GM EX OINT
TOPICAL_OINTMENT | CUTANEOUS | Status: DC | PRN
  Administered 2017-06-30: 1 via TOPICAL

## 2017-06-30 MED ORDER — THROMBIN 20000 UNITS EX SOLR
CUTANEOUS | Status: AC
  Filled 2017-06-30: qty 20000

## 2017-06-30 MED ORDER — MORPHINE SULFATE (PF) 4 MG/ML IV SOLN
INTRAVENOUS | Status: AC
  Filled 2017-06-30: qty 1

## 2017-06-30 MED ORDER — SUGAMMADEX SODIUM 200 MG/2ML IV SOLN
INTRAVENOUS | Status: DC | PRN
  Administered 2017-06-30: 500 mg via INTRAVENOUS

## 2017-06-30 MED ORDER — ONDANSETRON HCL 4 MG/2ML IJ SOLN
INTRAMUSCULAR | Status: DC | PRN
  Administered 2017-06-30: 4 mg via INTRAVENOUS

## 2017-06-30 MED ORDER — THROMBIN 5000 UNITS EX SOLR
CUTANEOUS | Status: AC
  Filled 2017-06-30: qty 5000

## 2017-06-30 MED ORDER — HYDROMORPHONE HCL 1 MG/ML IJ SOLN
INTRAMUSCULAR | Status: AC
  Filled 2017-06-30: qty 0.5

## 2017-06-30 MED ORDER — DEXAMETHASONE SODIUM PHOSPHATE 10 MG/ML IJ SOLN
INTRAMUSCULAR | Status: DC | PRN
  Administered 2017-06-30: 5 mg via INTRAVENOUS

## 2017-06-30 MED ORDER — ALBUTEROL SULFATE (2.5 MG/3ML) 0.083% IN NEBU: 2.5000 mg | INHALATION_SOLUTION | Freq: Four times a day (QID) | RESPIRATORY_TRACT | Status: DC | PRN

## 2017-06-30 MED ORDER — MORPHINE SULFATE (PF) 2 MG/ML IV SOLN
2.0000 mg | Freq: Once | INTRAVENOUS | Status: AC
  Administered 2017-06-30: 2 mg via INTRAVENOUS
  Filled 2017-06-30: qty 1

## 2017-06-30 MED ORDER — PHENYLEPHRINE 40 MCG/ML (10ML) SYRINGE FOR IV PUSH (FOR BLOOD PRESSURE SUPPORT)
PREFILLED_SYRINGE | INTRAVENOUS | Status: DC | PRN
  Administered 2017-06-30: 160 ug via INTRAVENOUS
  Administered 2017-06-30 (×2): 80 ug via INTRAVENOUS

## 2017-06-30 MED ORDER — FENTANYL CITRATE (PF) 250 MCG/5ML IJ SOLN
INTRAMUSCULAR | Status: DC | PRN
  Administered 2017-06-30: 50 ug via INTRAVENOUS
  Administered 2017-06-30 (×3): 100 ug via INTRAVENOUS
  Administered 2017-06-30: 150 ug via INTRAVENOUS
  Administered 2017-06-30: 50 ug via INTRAVENOUS

## 2017-06-30 MED ORDER — LIDOCAINE 2% (20 MG/ML) 5 ML SYRINGE
INTRAMUSCULAR | Status: AC
  Filled 2017-06-30: qty 5

## 2017-06-30 MED ORDER — MORPHINE SULFATE (PF) 4 MG/ML IV SOLN
4.0000 mg | INTRAVENOUS | Status: DC | PRN
  Administered 2017-06-30 – 2017-07-03 (×7): 4 mg via INTRAVENOUS
  Filled 2017-06-30 (×6): qty 1

## 2017-06-30 MED ORDER — LABETALOL HCL 5 MG/ML IV SOLN: 10.0000 mg | INTRAVENOUS | Status: DC | PRN

## 2017-06-30 MED ORDER — SODIUM CHLORIDE 0.9 % IV SOLN
1000.0000 mg | INTRAVENOUS | Status: AC
  Administered 2017-06-30: 1000 mg via INTRAVENOUS
  Filled 2017-06-30: qty 10

## 2017-06-30 MED ORDER — PANTOPRAZOLE SODIUM 40 MG IV SOLR
40.0000 mg | Freq: Every day | INTRAVENOUS | Status: DC
  Administered 2017-06-30 – 2017-07-01 (×2): 40 mg via INTRAVENOUS
  Filled 2017-06-30 (×2): qty 40

## 2017-06-30 MED ORDER — HYDROMORPHONE HCL 1 MG/ML IJ SOLN
INTRAMUSCULAR | Status: DC | PRN
  Administered 2017-06-30: 0.5 mg via INTRAVENOUS

## 2017-06-30 MED ORDER — BACITRACIN ZINC 500 UNIT/GM EX OINT
TOPICAL_OINTMENT | CUTANEOUS | Status: AC
  Filled 2017-06-30: qty 28.35

## 2017-06-30 MED ORDER — MIDAZOLAM HCL 2 MG/2ML IJ SOLN
INTRAMUSCULAR | Status: DC | PRN
  Administered 2017-06-30: 2 mg via INTRAVENOUS

## 2017-06-30 MED ORDER — LIDOCAINE-EPINEPHRINE 1 %-1:100000 IJ SOLN
INTRAMUSCULAR | Status: AC
  Filled 2017-06-30: qty 1

## 2017-06-30 MED ORDER — LINAGLIPTIN 5 MG PO TABS
5.0000 mg | ORAL_TABLET | Freq: Every day | ORAL | Status: DC
  Administered 2017-07-01 – 2017-07-03 (×3): 5 mg via ORAL
  Filled 2017-06-30 (×3): qty 1

## 2017-06-30 MED ORDER — MIDAZOLAM HCL 2 MG/2ML IJ SOLN
INTRAMUSCULAR | Status: AC
  Filled 2017-06-30: qty 2

## 2017-06-30 MED ORDER — PROPOFOL 10 MG/ML IV BOLUS
INTRAVENOUS | Status: DC | PRN
  Administered 2017-06-30 (×2): 100 mg via INTRAVENOUS

## 2017-06-30 MED ORDER — SODIUM CHLORIDE 0.9 % IR SOLN
Status: DC | PRN
  Administered 2017-06-30: 18:00:00

## 2017-06-30 MED ORDER — BUPIVACAINE HCL 0.5 % IJ SOLN
INTRAMUSCULAR | Status: DC | PRN
  Administered 2017-06-30: 10 mL

## 2017-06-30 MED ORDER — ALBUMIN HUMAN 5 % IV SOLN
INTRAVENOUS | Status: DC | PRN
  Administered 2017-06-30: 18:00:00 via INTRAVENOUS

## 2017-06-30 MED ORDER — THROMBIN 5000 UNITS EX SOLR
OROMUCOSAL | Status: DC | PRN
  Administered 2017-06-30: 18:00:00 via TOPICAL

## 2017-06-30 MED ORDER — LIDOCAINE 2% (20 MG/ML) 5 ML SYRINGE
INTRAMUSCULAR | Status: DC | PRN
  Administered 2017-06-30: 40 mg via INTRAVENOUS

## 2017-06-30 MED ORDER — 0.9 % SODIUM CHLORIDE (POUR BTL) OPTIME
TOPICAL | Status: DC | PRN
  Administered 2017-06-30 (×3): 1000 mL

## 2017-06-30 MED ORDER — PHENYLEPHRINE HCL 10 MG/ML IJ SOLN
INTRAVENOUS | Status: DC | PRN
  Administered 2017-06-30: 40 ug/min via INTRAVENOUS

## 2017-06-30 MED ORDER — SODIUM CHLORIDE 0.9 % IV SOLN
INTRAVENOUS | Status: DC
  Administered 2017-06-30 – 2017-07-01 (×2): via INTRAVENOUS

## 2017-06-30 MED ORDER — LACTATED RINGERS IV SOLN
INTRAVENOUS | Status: DC | PRN
  Administered 2017-06-30: 16:00:00 via INTRAVENOUS

## 2017-06-30 MED ORDER — PROPOFOL 10 MG/ML IV BOLUS
INTRAVENOUS | Status: AC
  Filled 2017-06-30: qty 40

## 2017-06-30 SURGICAL SUPPLY — 79 items
BANDAGE GAUZE 4  KLING STR (GAUZE/BANDAGES/DRESSINGS) IMPLANT
BATTERY IQ STERILE (MISCELLANEOUS) ×2 IMPLANT
BENZOIN TINCTURE PRP APPL 2/3 (GAUZE/BANDAGES/DRESSINGS) IMPLANT
BLADE CLIPPER SURG (BLADE) ×2 IMPLANT
BNDG GAUZE ELAST 4 BULKY (GAUZE/BANDAGES/DRESSINGS) ×4 IMPLANT
BNDG STRETCH 4X75 NS LF (GAUZE/BANDAGES/DRESSINGS) ×2 IMPLANT
BUR ACORN 6.0 PRECISION (BURR) ×2 IMPLANT
BUR MATCHSTICK NEURO 3.0 LAGG (BURR) ×2 IMPLANT
BUR SPIRAL ROUTER 2.3 (BUR) ×2 IMPLANT
BUR TAPERED ROUTER 3.0 (BURR) ×2 IMPLANT
CANISTER SUCT 3000ML PPV (MISCELLANEOUS) ×2 IMPLANT
CARTRIDGE OIL MAESTRO DRILL (MISCELLANEOUS) ×1 IMPLANT
DIFFUSER DRILL AIR PNEUMATIC (MISCELLANEOUS) ×2 IMPLANT
DRAPE MICROSCOPE LEICA (MISCELLANEOUS) ×2 IMPLANT
DRAPE NEUROLOGICAL W/INCISE (DRAPES) ×2 IMPLANT
DRAPE SURG 17X23 STRL (DRAPES) IMPLANT
DRAPE WARM FLUID 44X44 (DRAPE) ×2 IMPLANT
DRSG TELFA 3X8 NADH (GAUZE/BANDAGES/DRESSINGS) ×2 IMPLANT
DURAMATRIX ONLAY 3X3 (Plate) ×2 IMPLANT
DURAPREP 6ML APPLICATOR 50/CS (WOUND CARE) ×2 IMPLANT
ELECT REM PT RETURN 9FT ADLT (ELECTROSURGICAL) ×2
ELECTRODE REM PT RTRN 9FT ADLT (ELECTROSURGICAL) ×1 IMPLANT
EVACUATOR 1/8 PVC DRAIN (DRAIN) IMPLANT
EVACUATOR SILICONE 100CC (DRAIN) IMPLANT
GAUZE SPONGE 4X4 12PLY STRL (GAUZE/BANDAGES/DRESSINGS) ×2 IMPLANT
GAUZE SPONGE 4X4 16PLY XRAY LF (GAUZE/BANDAGES/DRESSINGS) IMPLANT
GLOVE BIO SURGEON STRL SZ 6.5 (GLOVE) ×2 IMPLANT
GLOVE BIO SURGEON STRL SZ7 (GLOVE) ×2 IMPLANT
GLOVE BIOGEL PI IND STRL 7.0 (GLOVE) ×2 IMPLANT
GLOVE BIOGEL PI IND STRL 7.5 (GLOVE) ×3 IMPLANT
GLOVE BIOGEL PI INDICATOR 7.0 (GLOVE) ×2
GLOVE BIOGEL PI INDICATOR 7.5 (GLOVE) ×3
GLOVE ECLIPSE 7.0 STRL STRAW (GLOVE) ×2 IMPLANT
GLOVE ECLIPSE 9.0 STRL (GLOVE) ×2 IMPLANT
GLOVE SS N UNI LF 6.5 STRL (GLOVE) ×2 IMPLANT
GLOVE SS N UNI LF 7.0 STRL (GLOVE) ×2 IMPLANT
GOWN STRL REUS W/ TWL LRG LVL3 (GOWN DISPOSABLE) ×4 IMPLANT
GOWN STRL REUS W/ TWL XL LVL3 (GOWN DISPOSABLE) ×1 IMPLANT
GOWN STRL REUS W/TWL 2XL LVL3 (GOWN DISPOSABLE) IMPLANT
GOWN STRL REUS W/TWL LRG LVL3 (GOWN DISPOSABLE) ×4
GOWN STRL REUS W/TWL XL LVL3 (GOWN DISPOSABLE) ×1
HEMOSTAT SURGICEL 2X14 (HEMOSTASIS) IMPLANT
HOOK DURA (MISCELLANEOUS) ×2 IMPLANT
KIT BASIN OR (CUSTOM PROCEDURE TRAY) ×2 IMPLANT
KIT CLIP RANEY GUN (KITS) ×4 IMPLANT
KIT ROOM TURNOVER OR (KITS) ×2 IMPLANT
NEEDLE HYPO 25X1 1.5 SAFETY (NEEDLE) ×2 IMPLANT
NS IRRIG 1000ML POUR BTL (IV SOLUTION) ×2 IMPLANT
OIL CARTRIDGE MAESTRO DRILL (MISCELLANEOUS) ×2
PACK CRANIOTOMY (CUSTOM PROCEDURE TRAY) ×2 IMPLANT
PATTIES SURGICAL .5 X.5 (GAUZE/BANDAGES/DRESSINGS) IMPLANT
PATTIES SURGICAL .5 X3 (DISPOSABLE) IMPLANT
PATTIES SURGICAL 1X1 (DISPOSABLE) IMPLANT
PLATE 1.5 4HOLE LONG STRAIGHT (Plate) ×4 IMPLANT
PLATE 1.5/0.5 8HOLE SUB TEMP (Plate) ×2 IMPLANT
RUBBERBAND STERILE (MISCELLANEOUS) ×4 IMPLANT
SCREW SELF DRILL HT 1.5/4MM (Screw) ×18 IMPLANT
SEALANT ADHERUS EXTEND TIP (MISCELLANEOUS) ×2 IMPLANT
SPONGE NEURO XRAY DETECT 1X3 (DISPOSABLE) IMPLANT
SPONGE SURGIFOAM ABS GEL 100 (HEMOSTASIS) ×2 IMPLANT
STAPLER VISISTAT 35W (STAPLE) ×2 IMPLANT
STOCKINETTE 4X48 STRL (DRAPES) ×2 IMPLANT
SUT ETHILON 3 0 FSL (SUTURE) IMPLANT
SUT ETHILON 3 0 PS 1 (SUTURE) IMPLANT
SUT NURALON 4 0 TR CR/8 (SUTURE) ×2 IMPLANT
SUT PROLENE 6 0 BV (SUTURE) IMPLANT
SUT STEEL 0 (SUTURE)
SUT STEEL 0 18XMFL TIE 17 (SUTURE) IMPLANT
SUT VIC AB 0 CT1 18XCR BRD8 (SUTURE) ×2 IMPLANT
SUT VIC AB 0 CT1 8-18 (SUTURE) ×2
SUT VIC AB 2-0 CT2 18 VCP726D (SUTURE) ×2 IMPLANT
SUT VIC AB 3-0 SH 8-18 (SUTURE) ×6 IMPLANT
TAPE CLOTH 1X10 TAN NS (GAUZE/BANDAGES/DRESSINGS) ×2 IMPLANT
TOWEL GREEN STERILE (TOWEL DISPOSABLE) ×2 IMPLANT
TOWEL GREEN STERILE FF (TOWEL DISPOSABLE) ×2 IMPLANT
TRAY FOLEY W/METER SILVER 16FR (SET/KITS/TRAYS/PACK) ×2 IMPLANT
TUBE CONNECTING 12X1/4 (SUCTIONS) ×2 IMPLANT
UNDERPAD 30X30 (UNDERPADS AND DIAPERS) IMPLANT
WATER STERILE IRR 1000ML POUR (IV SOLUTION) ×2 IMPLANT

## 2017-06-30 NOTE — Anesthesia Procedure Notes (Addendum)
Procedure Name: Intubation Date/Time: 06/30/2017 4:08 PM Performed by: Teressa Lower Pre-anesthesia Checklist: Patient identified, Emergency Drugs available, Suction available and Patient being monitored Patient Re-evaluated:Patient Re-evaluated prior to induction Oxygen Delivery Method: Circle system utilized Preoxygenation: Pre-oxygenation with 100% oxygen Induction Type: IV induction Ventilation: Mask ventilation without difficulty and Oral airway inserted - appropriate to patient size Laryngoscope Size: Mac and 3 Grade View: Grade I Tube type: Oral Tube size: 7.5 mm Number of attempts: 1 Airway Equipment and Method: Stylet and Oral airway Placement Confirmation: ETT inserted through vocal cords under direct vision,  positive ETCO2 and breath sounds checked- equal and bilateral Secured at: 23 cm Tube secured with: Tape Dental Injury: Teeth and Oropharynx as per pre-operative assessment

## 2017-06-30 NOTE — Progress Notes (Signed)
[facility]                                                                              Patient Demographics  Judy Nelson, is a 31 y.o. female, DOB - 26-Dec-1985, DEY:[id number]  Admit date - 06/26/2017   Admitting Physician Phillips Grout, MD  Outpatient Primary MD for the patient is Practice, [facility]  Outpatient specialists:   LOS - 4  days   Medical records reviewed and are as summarized below:    Chief Complaint  Patient presents with  . Neck Pain       Brief summary   HPI On 06/26/17 by Dr. Alver Fisher a 31 y.o.femalewith medical history significant of DM,obesity with CSF leak from her nose for about 6 weeks now. Pt sees dr Benjamine Mola. He did an analysis of the fluid in the last week or so and determined it was a csf. No records in EPIC. All history taken from patient with some difficulty as she is not happy about her current situation. She says an MRI was attempted but she was too big for the machine. It has been mentioned to her she needs an open MRI. Repair of the leak has not been set up yet. She called dr Benjamine Mola office today to report a headache, a week of fevers (today was 102) and neck pain who instructed her to come to ED. She has been on nasal steroids but none orally. She says the fluid is filling up in her lungs and makes her sob and coughing a lot. She reports when she lies down the fluid from the leak "are filling up my lungs". Pt has no h/o chf. Dr Benjamine Mola was called who advised pt get an LP.      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 Trace sphenoid sinus and ear fluid levels, clinical  for CSF rhinorrhea - ENT, Dr. Benjamine Mola was consulted, and neurosurgery consulted. No signs of meningitis - Per neurosurgery, Dr. Kathyrn Sheriff, patient will need bifrontal craniotomy and reconstruction of the skull base for repairing the anterior skull base encephalocele and dural defect. - surgery planned today at 1245    Morbid obesity with BMI of 50.0-59.9, adult ([place]) - Patient counseled on diet and weight control, BMI 57     Type 2 diabetes mellitus without complication ([place]) - Continue to hold metformin - Continue sliding scale insulin, no changes for now as patient is nothing by mouth. Postoperatively, may need to add Lantus for better glycemic control    Leukocytosis - Likely reactive, no meningitis    Anxiety - Continue Xanax as needed  Tobacco abuse - Continue nicotine patch, counseled on smoking cessation   Code Status: full  DVT Prophylaxis:   SCD's Family Communication: Discussed in detail with the patient, all imaging results, lab results explained to  the patient and mother   Disposition Plan:   Time Spent in minutes  25 minutes  Procedures:  None   Consultants:   ENT Neurosurgery   Antimicrobials :      Medications  Scheduled Meds: . Chlorhexidine Gluconate Cloth  6 each Topical Once   And  . Chlorhexidine Gluconate Cloth  6 each Topical Once  . insulin aspart  0-9 Units Subcutaneous TID WC  . nicotine  21 mg Transdermal Daily  . sodium chloride flush  3 mL Intravenous Q12H   Continuous Infusions: . sodium chloride    .  ceFAZolin (ANCEF) IV     PRN Meds:.sodium chloride, acetaminophen, ALPRAZolam, sodium chloride flush, traMADol   Antibiotics   Anti-infectives    Start     Dose/Rate Route Frequency Ordered Stop   06/30/17 1230  ceFAZolin (ANCEF) IVPB 2g/100 mL premix     2 g 200 mL/hr over 30 Minutes Intravenous To Short Stay 06/29/17 1541 07/01/17 1230        Subjective:   Judy Nelson was seen and examined today. No complaints this  morning. No fevers or chills. Mother at the bedside.  Patient denies dizziness, chest pain, shortness of breath, abdominal pain, N/V/D/C, new weakness, numbess, tingling.  Objective:   Vitals:   06/29/17 2229 06/30/17 0118 06/30/17 0500 06/30/17 0900  BP: 134/73 (!) 99/56 107/63 116/74  Pulse: (!) 106 92 94 87  Resp: 20 18 18 20   Temp: 98.8 F (37.1 C) 97.9 F (36.6 C) 98.2 F (36.8 C) 98.5 F (36.9 C)  TempSrc: Oral Oral Oral Oral  SpO2: 100% 100% 100% 99%  Weight:      Height:        Intake/Output Summary (Last 24 hours) at 06/30/17 1216 Last data filed at 06/30/17 0601  Gross per 24 hour  Intake             1120 ml  Output                0 ml  Net             1120 ml     Wt Readings from Last 3 Encounters:  06/29/17 (!) 164.7 kg (363 lb 1.6 oz)  06/26/16 (!) 172.4 kg (380 lb)  04/13/16 (!) 172.4 kg (380 lb)     Exam General: Alert and oriented x 3, NAD Eyes:  HEENT:  Atraumatic, normocephalic Cardiovascular: S1 S2 auscultated, no rubs, murmurs or gallops. Regular rate and rhythm. No pedal edema b/l Respiratory: Clear to auscultation bilaterally, no wheezing, rales or rhonchi Gastrointestinal: Soft, nontender, nondistended, + bowel sounds Ext: no pedal edema bilaterally Neuro: no new deficits Musculoskeletal: No digital cyanosis, clubbing Skin: No rashes Psych: Normal affect and demeanor, alert and oriented x3   Data Reviewed:  I have personally reviewed following labs and imaging studies  Micro Results Recent Results (from the past 240 hour(s))  Blood culture (routine x 2)     Status: None (Preliminary result)   Collection Time: 06/26/17  8:52 PM  Result Value Ref Range Status   Specimen Description BLOOD RIGHT ARM  Final   Special Requests   Final    BOTTLES DRAWN AEROBIC AND ANAEROBIC Blood Culture adequate volume   Culture NO GROWTH 4 DAYS  Final   Report Status PENDING  Incomplete  Blood culture (routine x 2)     Status: None (Preliminary result)    Collection Time: 06/26/17  8:55 PM  Result Value Ref Range Status  Specimen Description BLOOD RIGHT ARM  Final   Special Requests   Final    BOTTLES DRAWN AEROBIC AND ANAEROBIC Blood Culture adequate volume   Culture NO GROWTH 4 DAYS  Final   Report Status PENDING  Incomplete  Surgical PCR screen     Status: None   Collection Time: 06/29/17  3:43 PM  Result Value Ref Range Status   MRSA, PCR NEGATIVE NEGATIVE Final   Staphylococcus aureus NEGATIVE NEGATIVE Final    Comment: (NOTE) The Xpert SA Assay (FDA approved for NASAL specimens in patients 51 years of age and older), is one component of a comprehensive surveillance program. It is not intended to diagnose infection nor to guide or monitor treatment.     Radiology Reports Dg Chest 2 View  Result Date: 06/26/2017 CLINICAL DATA:  Fever, headache and known CSF leak. EXAM: CHEST  2 VIEW COMPARISON:  None. FINDINGS: The cardiac silhouette, mediastinal and hilar contours are within normal limits. The lungs are grossly clear. No pleural effusion. The bony thorax is intact. IMPRESSION: No acute cardiopulmonary findings. Electronically Signed   By: Marijo Sanes M.D.   On: 06/26/2017 21:36   Mr Jeri Cos DT Contrast  Result Date: 06/27/2017 CLINICAL DATA:  CSF leak awaiting repair. Fever. Assess for meningitis. EXAM: MRI HEAD WITHOUT AND WITH CONTRAST TECHNIQUE: Multiplanar, multiecho pulse sequences of the brain and surrounding structures were obtained without and with intravenous contrast. CONTRAST:  31mL MULTIHANCE GADOBENATE DIMEGLUMINE 529 MG/ML IV SOLN COMPARISON:  Maxillofacial CT June 23, 2017 FINDINGS: INTRACRANIAL CONTENTS: Small RIGHT frontal encephalocele through the cribriform defect as seen on prior CT, extending into the RIGHT ethmoid air cells. No vasogenic edema. No reduced diffusion to suggest acute ischemia or typical infection. No susceptibility artifact to suggest hemorrhage. The ventricles and sulci are normal for  patient's age. No suspicious parenchymal signal, masses, mass effect. No abnormal intraparenchymal or extra-axial enhancement. Preserved prepontine cistern. No abnormal extra-axial fluid collections. No extra-axial masses. VASCULAR: Normal major intracranial vascular flow voids present at skull base. SKULL AND UPPER CERVICAL SPINE: Partially empty sella. No suspicious calvarial bone marrow signal. Craniocervical junction maintained. Multiple scalp nodules seen with sebaceous or trichilemmal cysts. SINUSES/ORBITS: LEFT mastoid effusion. Trace sphenoid sinus air-fluid levels. The included ocular globes and orbital contents are non-suspicious. OTHER: None. IMPRESSION: 1. No acute intracranial process. No MR findings of meningitis or intracranial hypotension. 2. 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 Trace sphenoid sinus air-fluid levels equivocal for CSF rhinorrhea. 3. Partially empty sella. Electronically Signed   By: Elon Alas M.D.   On: 06/27/2017 06:50   Ct Maxillofacial Wo Contrast  Result Date: 06/23/2017 CLINICAL DATA:  Rhinorrhea when tilting the head forward. Nighttime drainage. EXAM: CT MAXILLOFACIAL WITHOUT CONTRAST TECHNIQUE: Multidetector CT imaging of the maxillofacial structures was performed. Multiplanar CT image reconstructions were also generated. COMPARISON:  None. FINDINGS: Frontal sinuses are clear and normal. Maxillary sinuses are clear and normal. Sphenoid sinus is clear and normal. Left ethmoid sinuses are clear and normal. There is abnormality of the posterosuperior ethmoid sinus region with a 2.7 x 2.0 x 1.5 cm region of air cell opacification. There appears to be bone deficiency at the skullbase seen communication with these. There may also be some bone deficiency along the lateral margin of the olfactory recess on the right. Therefore, this could be CSF rhinorrhea. The bony at openings  appear diminutive, and I think it is less likely that  this is a true encephalocele, but that is not excluded. Consider MRI I to evaluate that. Orbits appear normal. Globes are normal. Optic nerves are normal. Extra-ocular muscles are normal. Other soft tissues of the face are normal. Salivary glands are normal. No pharyngeal lesion is seen. Upper cervical spine is unremarkable. No brain lesion is evident. The sella is prominent. This could go along with increased intracranial pressure. IMPRESSION: Abnormality in the posterosuperior ethmoid region on the right with a 2.7 x 2.0 x 1.5 cm region of sinus opacification. Bone defects at the skullbase and along the lateral margin of the olfactory groove on the right. Therefore, this could be CSF leak age with CSF rhinorrhea. True encephalocele is doubted but not excluded. Consider MRI. Electronically Signed   By: Nelson Chimes M.D.   On: 06/23/2017 12:02    Lab Data:  CBC:  Recent Labs Lab 06/26/17 1925 06/27/17 0916 06/28/17 0313 06/30/17 0515  WBC 17.1* 16.7* 18.0* 13.8*  NEUTROABS 13.3*  --   --   --   HGB 11.4* 10.8* 10.3* 10.6*  HCT 36.2 33.9* 32.9* 34.0*  MCV 78.4 77.8* 77.4* 78.0  PLT 258 249 243 078   Basic Metabolic Panel:  Recent Labs Lab 06/26/17 1925 06/27/17 0916 06/28/17 0313 06/30/17 0515  NA 134* 134* 136 134*  K 3.9 3.3* 3.5 3.8  CL 101 101 101 102  CO2 24 26 28 26   GLUCOSE 169* 214* 154* 173*  BUN 7 7 7 9   CREATININE 0.87 1.04* 0.98 0.81  CALCIUM 9.1 9.0 9.1 8.8*   GFR: Estimated Creatinine Clearance: 166.1 mL/min (by C-G formula based on SCr of 0.81 mg/dL). Liver Function Tests: No results for input(s): AST, ALT, ALKPHOS, BILITOT, PROT, ALBUMIN in the last 168 hours. No results for input(s): LIPASE, AMYLASE in the last 168 hours. No results for input(s): AMMONIA in the last 168 hours. Coagulation Profile: No results for input(s): INR, PROTIME in the last 168 hours. Cardiac Enzymes: No results for input(s):  CKTOTAL, CKMB, CKMBINDEX, TROPONINI in the last 168 hours. BNP (last 3 results) No results for input(s): PROBNP in the last 8760 hours. HbA1C: No results for input(s): HGBA1C in the last 72 hours. CBG:  Recent Labs Lab 06/29/17 1123 06/29/17 1635 06/29/17 2227 06/30/17 0630 06/30/17 1200  GLUCAP 169* 213* 210* 174* 153*   Lipid Profile: No results for input(s): CHOL, HDL, LDLCALC, TRIG, CHOLHDL, LDLDIRECT in the last 72 hours. Thyroid Function Tests: No results for input(s): TSH, T4TOTAL, FREET4, T3FREE, THYROIDAB in the last 72 hours. Anemia Panel: No results for input(s): VITAMINB12, FOLATE, FERRITIN, TIBC, IRON, RETICCTPCT in the last 72 hours. Urine analysis:    Component Value Date/Time   COLORURINE YELLOW 06/26/2017 2230   APPEARANCEUR CLOUDY (A) 06/26/2017 2230   LABSPEC 1.023 06/26/2017 2230   PHURINE 5.0 06/26/2017 2230   GLUCOSEU NEGATIVE 06/26/2017 2230   HGBUR NEGATIVE 06/26/2017 2230   BILIRUBINUR NEGATIVE 06/26/2017 2230   KETONESUR NEGATIVE 06/26/2017 2230   PROTEINUR 30 (A) 06/26/2017 2230   UROBILINOGEN 0.2 10/05/2014 1300   NITRITE NEGATIVE 06/26/2017 2230   LEUKOCYTESUR LARGE (A) 06/26/2017 2230     Eliga Arvie M.D. [facility] 06/30/2017, 12:16 PM  Pager: [phone] Between 7am to 7pm - call Pager - [phone]  After 7pm go to [email or web address] - password [email or web address]  Call night coverage person covering after 7pm

## 2017-06-30 NOTE — Transfer of Care (Signed)
Immediate Anesthesia Transfer of Care Note  Patient: Judy Nelson  Procedure(s) Performed: Procedure(s) with comments: BIFRONTAL CRANIOTOMY (N/A) - BIFRONTAL CRANIOTOMY Repair of Encephalocele  Patient Location: PACU  Anesthesia Type:General  Level of Consciousness: awake, alert  and oriented  Airway & Oxygen Therapy: Patient Spontanous Breathing and Patient connected to face mask oxygen  Post-op Assessment: Report given to RN and Post -op Vital signs reviewed and stable  Post vital signs: Reviewed and stable  Last Vitals:  Vitals:   06/30/17 0500 06/30/17 0900  BP: 107/63 116/74  Pulse: 94 87  Resp: 18 20  Temp: 36.8 C 36.9 C  SpO2: 100% 99%    Last Pain:  Vitals:   06/30/17 0900  TempSrc: Oral  PainSc:       Patients Stated Pain Goal: 0 (48/27/07 8675)  Complications: No apparent anesthesia complications

## 2017-06-30 NOTE — Brief Op Note (Signed)
06/26/2017 - 06/30/2017  7:51 PM  PATIENT:  Judy Nelson  31 y.o. female  PRE-OPERATIVE DIAGNOSIS:  ENCEPHALOCELE LEAK  POST-OPERATIVE DIAGNOSIS:  ENCEPHALOCELE LEAK  PROCEDURE:  Procedure(s) with comments: BIFRONTAL CRANIOTOMY (N/A) - BIFRONTAL CRANIOTOMY Repair of Encephalocele  SURGEON:  Surgeon(s) and Role:    * Consuella Lose, MD - Primary    * Earnie Larsson, MD - Assisting  PHYSICIAN ASSISTANT: None  ASSISTANTS: Dr. Charlie Pitter MD   ANESTHESIA:   general  EBL:  Total I/O In: 1000 [I.V.:1000] Out: 37 [Urine:40]  BLOOD ADMINISTERED:none  DRAINS: none   LOCAL MEDICATIONS USED:  MARCAINE    and XYLOCAINE   SPECIMEN:  Source of Specimen:  Right frontal encephalocele  DISPOSITION OF SPECIMEN:  PATHOLOGY  COUNTS:  YES  TOURNIQUET:  * No tourniquets in log *  DICTATION: .Note written in EPIC  PLAN OF CARE: Admit to inpatient   PATIENT DISPOSITION:  PACU - hemodynamically stable.   Delay start of Pharmacological VTE agent (>24hrs) due to surgical blood loss or risk of bleeding: yes

## 2017-06-30 NOTE — Anesthesia Preprocedure Evaluation (Addendum)
Anesthesia Evaluation  Patient identified by MRN, date of birth, ID band Patient awake    Reviewed: Allergy & Precautions, NPO status , Patient's Chart, lab work & pertinent test results  History of Anesthesia Complications Negative for: history of anesthetic complications  Airway Mallampati: I  TM Distance: >3 FB Neck ROM: Full    Dental  (+) Dental Advisory Given   Pulmonary COPD,  COPD inhaler, Current Smoker,    breath sounds clear to auscultation       Cardiovascular (-) anginanegative cardio ROS   Rhythm:Regular Rate:Normal     Neuro/Psych  Headaches, Anxiety Depression Spontaneous CSF leak    GI/Hepatic Neg liver ROS, GERD  Medicated and Controlled,  Endo/Other  diabetes (glu 153), Oral Hypoglycemic AgentsMorbid obesity  Renal/GU negative Renal ROS     Musculoskeletal   Abdominal (+) + obese,   Peds  Hematology  (+) Blood dyscrasia (Hb 10.6), anemia ,   Anesthesia Other Findings   Reproductive/Obstetrics                            Anesthesia Physical Anesthesia Plan  ASA: III  Anesthesia Plan: General   Post-op Pain Management:    Induction: Intravenous  PONV Risk Score and Plan: 2 and Ondansetron and Dexamethasone  Airway Management Planned: Oral ETT  Additional Equipment: Arterial line  Intra-op Plan:   Post-operative Plan: Extubation in OR  Informed Consent: I have reviewed the patients History and Physical, chart, labs and discussed the procedure including the risks, benefits and alternatives for the proposed anesthesia with the patient or authorized representative who has indicated his/her understanding and acceptance.   Dental advisory given  Plan Discussed with: CRNA and Surgeon  Anesthesia Plan Comments: (Plan routine monitors, A line, GETA)        Anesthesia Quick Evaluation

## 2017-07-01 LAB — GLUCOSE, CAPILLARY
GLUCOSE-CAPILLARY: 230 mg/dL — AB (ref 65–99)
GLUCOSE-CAPILLARY: 223 mg/dL — AB (ref 65–99)
GLUCOSE-CAPILLARY: 222 mg/dL — AB (ref 65–99)
Glucose-Capillary: 177 mg/dL — ABNORMAL HIGH (ref 65–99)

## 2017-07-01 LAB — BASIC METABOLIC PANEL
ANION GAP: 6 (ref 5–15)
BUN: 5 mg/dL — ABNORMAL LOW (ref 6–20)
CALCIUM: 8.7 mg/dL — AB (ref 8.9–10.3)
CO2: 24 mmol/L (ref 22–32)
Chloride: 102 mmol/L (ref 101–111)
Creatinine, Ser: 0.81 mg/dL (ref 0.44–1.00)
GFR calc non Af Amer: >60 mL/min (ref >60)
Glucose, Bld: 230 mg/dL — ABNORMAL HIGH (ref 65–99)
Potassium: 4.2 mmol/L (ref 3.5–5.1)
Sodium: 132 mmol/L — ABNORMAL LOW (ref 135–145)

## 2017-07-01 LAB — CULTURE, BLOOD (ROUTINE X 2)
CULTURE: NO GROWTH
Culture: NO GROWTH
Special Requests: ADEQUATE
Special Requests: ADEQUATE

## 2017-07-01 LAB — CBC
HEMATOCRIT: 31.4 % — AB (ref 36.0–46.0)
HEMOGLOBIN: 9.9 g/dL — AB (ref 12.0–15.0)
MCH: 24.4 pg — ABNORMAL LOW (ref 26.0–34.0)
MCHC: 31.5 g/dL (ref 30.0–36.0)
MCV: 77.3 fL — ABNORMAL LOW (ref 78.0–100.0)
Platelets: 267 10*3/uL (ref 150–400)
RBC: 4.06 MIL/uL (ref 3.87–5.11)
RDW: 15.1 % (ref 11.5–15.5)
WBC: 22 10*3/uL — AB (ref 4.0–10.5)

## 2017-07-01 MED ORDER — INSULIN ASPART 100 UNIT/ML ~~LOC~~ SOLN
3.0000 [IU] | Freq: Three times a day (TID) | SUBCUTANEOUS | Status: DC
  Administered 2017-07-01 – 2017-07-03 (×5): 3 [IU] via SUBCUTANEOUS

## 2017-07-01 MED ORDER — GUAIFENESIN 100 MG/5ML PO SOLN
5.0000 mL | ORAL | Status: DC | PRN
  Administered 2017-07-01 (×3): 100 mg via ORAL
  Filled 2017-07-01 (×3): qty 5

## 2017-07-01 MED ORDER — INSULIN GLARGINE 100 UNIT/ML ~~LOC~~ SOLN
10.0000 [IU] | SUBCUTANEOUS | Status: DC
  Administered 2017-07-01: 10 [IU] via SUBCUTANEOUS
  Filled 2017-07-01 (×2): qty 0.1

## 2017-07-01 NOTE — Progress Notes (Signed)
[facility]                                                                              Patient Demographics  Judy Nelson, is a 31 y.o. female, DOB - 1985-12-24, WJX:[id number]  Admit date - 06/26/2017   Admitting Physician Phillips Grout, MD  Outpatient Primary MD for the patient is Practice, [facility]  Outpatient specialists:   LOS - 5  days   Medical records reviewed and are as summarized below:    Chief Complaint  Patient presents with  . Neck Pain       Brief summary   HPI On 06/26/17 by Dr. Alver Fisher a 31 y.o.femalewith medical history significant of DM,obesity with CSF leak from her nose for about 6 weeks now. Pt sees dr Benjamine Mola. He did an analysis of the fluid in the last week or so and determined it was a csf. No records in EPIC. All history taken from patient with some difficulty as she is not happy about her current situation. She says an MRI was attempted but she was too big for the machine. It has been mentioned to her she needs an open MRI. Repair of the leak has not been set up yet. She called dr Benjamine Mola office today to report a headache, a week of fevers (today was 102) and neck pain who instructed her to come to ED. She has been on nasal steroids but none orally. She says the fluid is filling up in her lungs and makes her sob and coughing a lot. She reports when she lies down the fluid from the leak "are filling up my lungs". Pt has no h/o chf. Dr Benjamine Mola was called who advised pt get an LP.      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 Trace sphenoid sinus and ear fluid levels, clinical  for CSF rhinorrhea - ENT, Dr. Benjamine Mola was consulted, and neurosurgery consulted. No signs of meningitis -  Status post bifrontal craniotomy postop day #1, seen in ICU, management per neurosurgery - continue IV Ancef    Morbid obesity with BMI of 50.0-59.9, adult ([place]) - Patient counseled on diet and weight control, BMI 57     Type 2 diabetes mellitus without complication ([place]) - Continue to hold metformin - CBGs uncontrolled, added Lantus 10 units daily, NovoLog 3 units with meals, continue sliding scale insulin    Leukocytosis - postop day #1,continue IV Ancef, - Blood cultures negative so far    Anxiety - currently stable, continue Xanax as needed. Tobacco abuse - Continue nicotine patch, counseled on smoking cessation   Code Status: full  DVT Prophylaxis:   SCD's Family Communication: Discussed in detail with the patient, all imaging results, lab results explained to the patient and mother   Disposition Plan: when cleared by neurosurgery  Time Spent in minutes  25 minutes  Procedures:  Bifrontal craniotomy, 9/14  Consultants:   ENT Neurosurgery   Antimicrobials :   IV Ancef 9/14 >>   Medications  Scheduled Meds: . escitalopram  10 mg Oral Daily  . insulin aspart  0-9 Units Subcutaneous TID WC  . linagliptin  5 mg Oral Daily  . nicotine  21 mg Transdermal Daily  . pantoprazole (PROTONIX) IV  40 mg Intravenous QHS  . sodium chloride flush  3 mL Intravenous Q12H   Continuous Infusions: . sodium chloride Stopped (06/30/17 1708)  . sodium chloride 10 mL/hr at 07/01/17 0300   PRN Meds:.sodium chloride, acetaminophen, albuterol, ALPRAZolam, guaiFENesin, HYDROcodone-acetaminophen, labetalol, morphine injection, ondansetron **OR** ondansetron (ZOFRAN) IV, promethazine, sodium chloride flush, SUMAtriptan, traMADol   Antibiotics   Anti-infectives    Start     Dose/Rate Route Frequency Ordered Stop   07/01/17 0000  ceFAZolin (ANCEF) 3 g in dextrose 5 % 50 mL IVPB       3 g 130 mL/hr over 30 Minutes Intravenous Every 8 hours 06/30/17 2048 07/01/17 0845   06/30/17 1821  bacitracin 50,000 Units in sodium chloride irrigation 0.9 % 500 mL irrigation  Status:  Discontinued       As needed 06/30/17 1821 06/30/17 1956   06/30/17 1230  ceFAZolin (ANCEF) IVPB 2g/100 mL premix     2 g 200 mL/hr over 30 Minutes Intravenous To Short Stay 06/29/17 1541 06/30/17 1612        Subjective:   Judy Nelson was seen and examined today. Seen in ICU, per mom she has been able to get up and ambulate to the bathroom.  Patient denies dizziness, chest pain, shortness of breath, abdominal pain, N/V/D/C. No fevers.  Objective:   Vitals:   07/01/17 1000 07/01/17 1100 07/01/17 1200 07/01/17 1300  BP: 118/61 123/68 133/69 119/71  Pulse: 85 81 87 80  Resp: (!) 23 (!) 23 (!) 21 17  Temp:      TempSrc:      SpO2: 95% 97% 92% 98%  Weight:      Height:        Intake/Output Summary (Last 24 hours) at 07/01/17 1310 Last data filed at 07/01/17 0900  Gross per 24 hour  Intake          3687.17 ml  Output             3125 ml  Net           562.17 ml     Wt Readings from Last 3 Encounters:  07/01/17 (!) 166.1 kg (366 lb 2.9 oz)  06/26/16 (!) 172.4 kg (380 lb)  04/13/16 (!) 172.4 kg (380 lb)     Exam   General: Alert and oriented x 3, NAD  Eyes:  HEENT:  Dressing intact   Cardiovascular: S1 S2 auscultated, no rubs, murmurs or gallops. Regular rate and rhythm. No pedal edema b/l  Respiratory: Clear to auscultation bilaterally, no wheezing, rales or rhonchi  Gastrointestinal: Soft, nontender, nondistended, + bowel sounds  Ext: no pedal edema bilaterally  Neuro: no new deficits  Musculoskeletal: No digital cyanosis, clubbing  Skin: No rashes  Psych: Normal affect and demeanor, alert and oriented x3    Data Reviewed:  I have personally reviewed following labs and imaging studies  Micro Results Recent Results (from the past 240 hour(s))  Blood culture  (routine x 2)     Status: None   Collection Time: 06/26/17  8:52 PM  Result  Value Ref Range Status   Specimen Description BLOOD RIGHT ARM  Final   Special Requests   Final    BOTTLES DRAWN AEROBIC AND ANAEROBIC Blood Culture adequate volume   Culture NO GROWTH 5 DAYS  Final   Report Status 07/01/2017 FINAL  Final  Blood culture (routine x 2)     Status: None   Collection Time: 06/26/17  8:55 PM  Result Value Ref Range Status   Specimen Description BLOOD RIGHT ARM  Final   Special Requests   Final    BOTTLES DRAWN AEROBIC AND ANAEROBIC Blood Culture adequate volume   Culture NO GROWTH 5 DAYS  Final   Report Status 07/01/2017 FINAL  Final  Surgical PCR screen     Status: None   Collection Time: 06/29/17  3:43 PM  Result Value Ref Range Status   MRSA, PCR NEGATIVE NEGATIVE Final   Staphylococcus aureus NEGATIVE NEGATIVE Final    Comment: (NOTE) The Xpert SA Assay (FDA approved for NASAL specimens in patients 58 years of age and older), is one component of a comprehensive surveillance program. It is not intended to diagnose infection nor to guide or monitor treatment.     Radiology Reports Dg Chest 2 View  Result Date: 06/26/2017 CLINICAL DATA:  Fever, headache and known CSF leak. EXAM: CHEST  2 VIEW COMPARISON:  None. FINDINGS: The cardiac silhouette, mediastinal and hilar contours are within normal limits. The lungs are grossly clear. No pleural effusion. The bony thorax is intact. IMPRESSION: No acute cardiopulmonary findings. Electronically Signed   By: Marijo Sanes M.D.   On: 06/26/2017 21:36   Mr Jeri Cos HA Contrast  Result Date: 06/27/2017 CLINICAL DATA:  CSF leak awaiting repair. Fever. Assess for meningitis. EXAM: MRI HEAD WITHOUT AND WITH CONTRAST TECHNIQUE: Multiplanar, multiecho pulse sequences of the brain and surrounding structures were obtained without and with intravenous contrast. CONTRAST:  99mL MULTIHANCE GADOBENATE DIMEGLUMINE 529 MG/ML IV SOLN COMPARISON:   Maxillofacial CT June 23, 2017 FINDINGS: INTRACRANIAL CONTENTS: Small RIGHT frontal encephalocele through the cribriform defect as seen on prior CT, extending into the RIGHT ethmoid air cells. No vasogenic edema. No reduced diffusion to suggest acute ischemia or typical infection. No susceptibility artifact to suggest hemorrhage. The ventricles and sulci are normal for patient's age. No suspicious parenchymal signal, masses, mass effect. No abnormal intraparenchymal or extra-axial enhancement. Preserved prepontine cistern. No abnormal extra-axial fluid collections. No extra-axial masses. VASCULAR: Normal major intracranial vascular flow voids present at skull base. SKULL AND UPPER CERVICAL SPINE: Partially empty sella. No suspicious calvarial bone marrow signal. Craniocervical junction maintained. Multiple scalp nodules seen with sebaceous or trichilemmal cysts. SINUSES/ORBITS: LEFT mastoid effusion. Trace sphenoid sinus air-fluid levels. The included ocular globes and orbital contents are non-suspicious. OTHER: None. IMPRESSION: 1. No acute intracranial process. No MR findings of meningitis or intracranial hypotension. 2. 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 Trace sphenoid sinus air-fluid levels equivocal for CSF rhinorrhea. 3. Partially empty sella. Electronically Signed   By: Elon Alas M.D.   On: 06/27/2017 06:50   Ct Maxillofacial Wo Contrast  Result Date: 06/23/2017 CLINICAL DATA:  Rhinorrhea when tilting the head forward. Nighttime drainage. EXAM: CT MAXILLOFACIAL WITHOUT CONTRAST TECHNIQUE: Multidetector CT imaging of the maxillofacial structures was performed. Multiplanar CT image reconstructions were also generated. COMPARISON:  None. FINDINGS: Frontal sinuses are clear and normal. Maxillary sinuses are clear and normal. Sphenoid sinus is clear and normal. Left ethmoid sinuses  are clear and  normal. There is abnormality of the posterosuperior ethmoid sinus region with a 2.7 x 2.0 x 1.5 cm region of air cell opacification. There appears to be bone deficiency at the skullbase seen communication with these. There may also be some bone deficiency along the lateral margin of the olfactory recess on the right. Therefore, this could be CSF rhinorrhea. The bony at openings appear diminutive, and I think it is less likely that this is a true encephalocele, but that is not excluded. Consider MRI I to evaluate that. Orbits appear normal. Globes are normal. Optic nerves are normal. Extra-ocular muscles are normal. Other soft tissues of the face are normal. Salivary glands are normal. No pharyngeal lesion is seen. Upper cervical spine is unremarkable. No brain lesion is evident. The sella is prominent. This could go along with increased intracranial pressure. IMPRESSION: Abnormality in the posterosuperior ethmoid region on the right with a 2.7 x 2.0 x 1.5 cm region of sinus opacification. Bone defects at the skullbase and along the lateral margin of the olfactory groove on the right. Therefore, this could be CSF leak age with CSF rhinorrhea. True encephalocele is doubted but not excluded. Consider MRI. Electronically Signed   By: Nelson Chimes M.D.   On: 06/23/2017 12:02    Lab Data:  CBC:  Recent Labs Lab 06/26/17 1925 06/27/17 0916 06/28/17 0313 06/30/17 0515 07/01/17 0546  WBC 17.1* 16.7* 18.0* 13.8* 22.0*  NEUTROABS 13.3*  --   --   --   --   HGB 11.4* 10.8* 10.3* 10.6* 9.9*  HCT 36.2 33.9* 32.9* 34.0* 31.4*  MCV 78.4 77.8* 77.4* 78.0 77.3*  PLT 258 249 243 255 527   Basic Metabolic Panel:  Recent Labs Lab 06/26/17 1925 06/27/17 0916 06/28/17 0313 06/30/17 0515 07/01/17 0546  NA 134* 134* 136 134* 132*  K 3.9 3.3* 3.5 3.8 4.2  CL 101 101 101 102 102  CO2 24 26 28 26 24   GLUCOSE 169* 214* 154* 173* 230*  BUN 7 7 7 9  5*  CREATININE 0.87 1.04* 0.98 0.81 0.81  CALCIUM 9.1 9.0 9.1  8.8* 8.7*   GFR: Estimated Creatinine Clearance: 165.8 mL/min (by C-G formula based on SCr of 0.81 mg/dL). Liver Function Tests: No results for input(s): AST, ALT, ALKPHOS, BILITOT, PROT, ALBUMIN in the last 168 hours. No results for input(s): LIPASE, AMYLASE in the last 168 hours. No results for input(s): AMMONIA in the last 168 hours. Coagulation Profile: No results for input(s): INR, PROTIME in the last 168 hours. Cardiac Enzymes: No results for input(s): CKTOTAL, CKMB, CKMBINDEX, TROPONINI in the last 168 hours. BNP (last 3 results) No results for input(s): PROBNP in the last 8760 hours. HbA1C: No results for input(s): HGBA1C in the last 72 hours. CBG:  Recent Labs Lab 06/30/17 1200 06/30/17 2000 06/30/17 2114 07/01/17 0725 07/01/17 1220  GLUCAP 153* 177* 233* 230* 223*   Lipid Profile: No results for input(s): CHOL, HDL, LDLCALC, TRIG, CHOLHDL, LDLDIRECT in the last 72 hours. Thyroid Function Tests: No results for input(s): TSH, T4TOTAL, FREET4, T3FREE, THYROIDAB in the last 72 hours. Anemia Panel: No results for input(s): VITAMINB12, FOLATE, FERRITIN, TIBC, IRON, RETICCTPCT in the last 72 hours. Urine analysis:    Component Value Date/Time   COLORURINE YELLOW 06/26/2017 2230   APPEARANCEUR CLOUDY (A) 06/26/2017 2230   LABSPEC 1.023 06/26/2017 2230   PHURINE 5.0 06/26/2017 2230   GLUCOSEU NEGATIVE 06/26/2017 2230   HGBUR NEGATIVE 06/26/2017 2230   BILIRUBINUR NEGATIVE 06/26/2017  [place] 06/26/2017 2230   PROTEINUR 30 (A) 06/26/2017 2230   UROBILINOGEN 0.2 10/05/2014 1300   NITRITE NEGATIVE 06/26/2017 2230   LEUKOCYTESUR LARGE (A) 06/26/2017 2230     Christia Coaxum M.D. [facility] 07/01/2017, 1:10 PM  Pager: [phone] Between 7am to 7pm - call Pager - [phone]  After 7pm go to [email or web address] - password [email or web address]  Call night coverage person covering after 7pm

## 2017-07-01 NOTE — Progress Notes (Signed)
Postop day 1. Patient with some headache. She noted some bloody drainage from her nose upon getting out of bed this morning. No obvious CSF leak however.  She is afebrile. Her vital signs are stable. Urine output is good. She is awake and alert. She is oriented and appropriate. Her speech is fluent. Vision is normal bilaterally. Extraocular movements are full. Facial movement normal bilaterally. Motor 5/5 bilaterally. Dressing clean and dry.  Status post bifrontal craniotomy for treatment of encephalocele with associated CSF leak. Continue ICU observation and IV antibiotics. Mobilize.

## 2017-07-02 LAB — GLUCOSE, CAPILLARY
GLUCOSE-CAPILLARY: 204 mg/dL — AB (ref 65–99)
Glucose-Capillary: 185 mg/dL — ABNORMAL HIGH (ref 65–99)
Glucose-Capillary: 218 mg/dL — ABNORMAL HIGH (ref 65–99)
Glucose-Capillary: 177 mg/dL — ABNORMAL HIGH (ref 65–99)

## 2017-07-02 LAB — BASIC METABOLIC PANEL
ANION GAP: 5 (ref 5–15)
BUN: 5 mg/dL — AB (ref 6–20)
CALCIUM: 8.8 mg/dL — AB (ref 8.9–10.3)
CO2: 28 mmol/L (ref 22–32)
CREATININE: 0.77 mg/dL (ref 0.44–1.00)
Chloride: 101 mmol/L (ref 101–111)
GFR calc Af Amer: >60 mL/min (ref >60)
GLUCOSE: 179 mg/dL — AB (ref 65–99)
Potassium: 3.8 mmol/L (ref 3.5–5.1)
Sodium: 134 mmol/L — ABNORMAL LOW (ref 135–145)

## 2017-07-02 LAB — CBC
HCT: 31.6 % — ABNORMAL LOW (ref 36.0–46.0)
Hemoglobin: 9.9 g/dL — ABNORMAL LOW (ref 12.0–15.0)
MCH: 24.6 pg — ABNORMAL LOW (ref 26.0–34.0)
MCHC: 31.3 g/dL (ref 30.0–36.0)
MCV: 78.6 fL (ref 78.0–100.0)
Platelets: 236 10*3/uL (ref 150–400)
RBC: 4.02 MIL/uL (ref 3.87–5.11)
RDW: 15.7 % — AB (ref 11.5–15.5)
WBC: 17.1 10*3/uL — AB (ref 4.0–10.5)

## 2017-07-02 MED ORDER — PANTOPRAZOLE SODIUM 40 MG PO TBEC
40.0000 mg | DELAYED_RELEASE_TABLET | Freq: Every day | ORAL | Status: DC
  Administered 2017-07-02: 40 mg via ORAL
  Filled 2017-07-02: qty 1

## 2017-07-02 MED ORDER — INSULIN GLARGINE 100 UNIT/ML ~~LOC~~ SOLN
15.0000 [IU] | SUBCUTANEOUS | Status: DC
  Administered 2017-07-02: 15 [IU] via SUBCUTANEOUS
  Filled 2017-07-02 (×2): qty 0.15

## 2017-07-02 MED ORDER — HYDROCODONE-ACETAMINOPHEN 5-325 MG PO TABS
1.0000 | ORAL_TABLET | ORAL | Status: DC | PRN
  Administered 2017-07-02 – 2017-07-03 (×5): 2 via ORAL
  Filled 2017-07-02 (×5): qty 2

## 2017-07-02 MED ORDER — SENNOSIDES-DOCUSATE SODIUM 8.6-50 MG PO TABS
1.0000 | ORAL_TABLET | Freq: Two times a day (BID) | ORAL | Status: DC
  Administered 2017-07-02 – 2017-07-03 (×3): 1 via ORAL
  Filled 2017-07-02 (×3): qty 1

## 2017-07-02 MED ORDER — POLYETHYLENE GLYCOL 3350 17 G PO PACK: 17.0000 g | PACK | Freq: Every day | ORAL | Status: DC | PRN

## 2017-07-02 NOTE — Progress Notes (Signed)
Postop day 2. Patient complains of headache. No new nasal drainage. No changes in her vision.  Afebrile. Vitals are stable. She is awake and alert. She is oriented and appropriate. Cranial nerve function is intact bilaterally. Motor and sensory function of her extremities normal. Dressing clean and dry.  Status post bifrontal craniotomy for resection and repair of frontal encephalocele. Mobilize. Transfer to floor.

## 2017-07-02 NOTE — Progress Notes (Signed)
[facility]                                                                              Patient Demographics  Judy Nelson, is a 31 y.o. female, DOB - November 26, 1985, ION:[id number]  Admit date - 06/26/2017   Admitting Physician Phillips Grout, MD  Outpatient Primary MD for the patient is Practice, [facility]  Outpatient specialists:   LOS - 6  days   Medical records reviewed and are as summarized below:    Chief Complaint  Patient presents with  . Neck Pain       Brief summary   HPI On 06/26/17 by Dr. Alver Fisher a 31 y.o.femalewith medical history significant of DM,obesity with CSF leak from her nose for about 6 weeks now. Pt sees dr Benjamine Mola. He did an analysis of the fluid in the last week or so and determined it was a csf. No records in EPIC. All history taken from patient with some difficulty as she is not happy about her current situation. She says an MRI was attempted but she was too big for the machine. It has been mentioned to her she needs an open MRI. Repair of the leak has not been set up yet. She called dr Benjamine Mola office today to report a headache, a week of fevers (today was 102) and neck pain who instructed her to come to ED. She has been on nasal steroids but none orally. She says the fluid is filling up in her lungs and makes her sob and coughing a lot. She reports when she lies down the fluid from the leak "are filling up my lungs". Pt has no h/o chf. Dr Benjamine Mola was called who advised pt get an LP.      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 Trace sphenoid sinus and ear fluid levels, clinical  for CSF rhinorrhea - ENT, Dr. Benjamine Mola was consulted, and neurosurgery consulted. No signs of meningitis -  Status post bifrontal craniotomy postop day #2, seen in ICU, management per neurosurgery - continue IV Ancef - complaining of pain, headache, increased oral narcotics, continue IV morphine as ordered. Discussed with RN, patient has been ambulating without difficulty. Will follow closely, does not need PCA right now - added stool softeners    Morbid obesity with BMI of 50.0-59.9, adult ([place]) - Patient counseled on diet and weight control, BMI 57     Type 2 diabetes mellitus without complication ([place]) - Continue to hold metformin - CPG still elevated, increase Lantus to 15 units daily, continue NovoLog 3 units with meals, sliding scale insulin    Leukocytosis - postop day #2, continue IV Ancef, improving - Blood cultures negative so far    Anxiety - currently stable, continue Xanax as needed.  Tobacco abuse - Continue nicotine patch, counseled on smoking  cessation   Code Status: full  DVT Prophylaxis:   SCD's Family Communication: Discussed in detail with the patient, all imaging results, lab results explained to the patient and mother   Disposition Plan: when cleared by neurosurgery, transfer to floor if okay with neurosurgery  Time Spent in minutes  25 minutes  Procedures:  Bifrontal craniotomy, 9/14  Consultants:   ENT Neurosurgery   Antimicrobials :   IV Ancef 9/14 >>   Medications  Scheduled Meds: . escitalopram  10 mg Oral Daily  . insulin aspart  0-9 Units Subcutaneous TID WC  . insulin aspart  3 Units Subcutaneous TID WC  . insulin glargine  10 Units Subcutaneous Q24H  . linagliptin  5 mg Oral Daily  . nicotine  21 mg Transdermal Daily  . pantoprazole (PROTONIX) IV  40 mg Intravenous QHS  . senna-docusate  1 tablet Oral BID  . sodium chloride flush  3 mL Intravenous Q12H   Continuous Infusions: . sodium chloride Stopped (06/30/17 1708)  . sodium  chloride Stopped (07/01/17 2015)   PRN Meds:.sodium chloride, acetaminophen, albuterol, ALPRAZolam, guaiFENesin, HYDROcodone-acetaminophen, labetalol, morphine injection, ondansetron **OR** ondansetron (ZOFRAN) IV, polyethylene glycol, promethazine, sodium chloride flush, SUMAtriptan, traMADol   Antibiotics   Anti-infectives    Start     Dose/Rate Route Frequency Ordered Stop   07/01/17 0000  ceFAZolin (ANCEF) 3 g in dextrose 5 % 50 mL IVPB     3 g 130 mL/hr over 30 Minutes Intravenous Every 8 hours 06/30/17 2048 07/01/17 0845   06/30/17 1821  bacitracin 50,000 Units in sodium chloride irrigation 0.9 % 500 mL irrigation  Status:  Discontinued       As needed 06/30/17 1821 06/30/17 1956   06/30/17 1230  ceFAZolin (ANCEF) IVPB 2g/100 mL premix     2 g 200 mL/hr over 30 Minutes Intravenous To Short Stay 06/29/17 1541 06/30/17 1612        Subjective:   Judy Nelson was seen and examined today. Seen in ICU, patient complaining of headache and pain, had a rough night. Per RN, she has been ambulating without any assistance in getting up by herself.  Patient denies dizziness, chest pain, shortness of breath, abdominal pain, N/V/D/C. No fevers.  Objective:   Vitals:   07/02/17 0700 07/02/17 0749 07/02/17 0800 07/02/17 0900  BP: 137/75  131/87   Pulse: 76  81 93  Resp: (!) 22  19 18   Temp:  98.4 F (36.9 C)    TempSrc:  Oral    SpO2: 95%  93% 97%  Weight:      Height:        Intake/Output Summary (Last 24 hours) at 07/02/17 1011 Last data filed at 07/02/17 0900  Gross per 24 hour  Intake             1010 ml  Output                0 ml  Net             1010 ml     Wt Readings from Last 3 Encounters:  07/01/17 (!) 166.1 kg (366 lb 2.9 oz)  06/26/16 (!) 172.4 kg (380 lb)  04/13/16 (!) 172.4 kg (380 lb)     Exam  Physical Exam  General: Alert and oriented x 3, NAD  Eyes:   HEENT:  Dressing intact  Cardiovascular: S1 S2 auscultated, no rubs, murmurs or gallops.  Regular rate and rhythm. No pedal edema b/l  Respiratory: Clear  to auscultation bilaterally, no wheezing, rales or rhonchi  Gastrointestinal: obese, Soft, nontender, nondistended, + bowel sounds  Ext: no pedal edema bilaterally  Neuro: AAOx3, Cr N's II- XII. Strength 5/5 upper and lower extremities bilaterally, speech clear, sensations grossly intact  Musculoskeletal: No digital cyanosis, clubbing  Skin: No rashes  Psych: Normal affect and demeanor, alert and oriented x3    Data Reviewed:  I have personally reviewed following labs and imaging studies  Micro Results Recent Results (from the past 240 hour(s))  Blood culture (routine x 2)     Status: None   Collection Time: 06/26/17  8:52 PM  Result Value Ref Range Status   Specimen Description BLOOD RIGHT ARM  Final   Special Requests   Final    BOTTLES DRAWN AEROBIC AND ANAEROBIC Blood Culture adequate volume   Culture NO GROWTH 5 DAYS  Final   Report Status 07/01/2017 FINAL  Final  Blood culture (routine x 2)     Status: None   Collection Time: 06/26/17  8:55 PM  Result Value Ref Range Status   Specimen Description BLOOD RIGHT ARM  Final   Special Requests   Final    BOTTLES DRAWN AEROBIC AND ANAEROBIC Blood Culture adequate volume   Culture NO GROWTH 5 DAYS  Final   Report Status 07/01/2017 FINAL  Final  Surgical PCR screen     Status: None   Collection Time: 06/29/17  3:43 PM  Result Value Ref Range Status   MRSA, PCR NEGATIVE NEGATIVE Final   Staphylococcus aureus NEGATIVE NEGATIVE Final    Comment: (NOTE) The Xpert SA Assay (FDA approved for NASAL specimens in patients 82 years of age and older), is one component of a comprehensive surveillance program. It is not intended to diagnose infection nor to guide or monitor treatment.     Radiology Reports Dg Chest 2 View  Result Date: 06/26/2017 CLINICAL DATA:  Fever, headache and known CSF leak. EXAM: CHEST  2 VIEW COMPARISON:  None. FINDINGS: The cardiac  silhouette, mediastinal and hilar contours are within normal limits. The lungs are grossly clear. No pleural effusion. The bony thorax is intact. IMPRESSION: No acute cardiopulmonary findings. Electronically Signed   By: Marijo Sanes M.D.   On: 06/26/2017 21:36   Mr Jeri Cos JQ Contrast  Result Date: 06/27/2017 CLINICAL DATA:  CSF leak awaiting repair. Fever. Assess for meningitis. EXAM: MRI HEAD WITHOUT AND WITH CONTRAST TECHNIQUE: Multiplanar, multiecho pulse sequences of the brain and surrounding structures were obtained without and with intravenous contrast. CONTRAST:  62mL MULTIHANCE GADOBENATE DIMEGLUMINE 529 MG/ML IV SOLN COMPARISON:  Maxillofacial CT June 23, 2017 FINDINGS: INTRACRANIAL CONTENTS: Small RIGHT frontal encephalocele through the cribriform defect as seen on prior CT, extending into the RIGHT ethmoid air cells. No vasogenic edema. No reduced diffusion to suggest acute ischemia or typical infection. No susceptibility artifact to suggest hemorrhage. The ventricles and sulci are normal for patient's age. No suspicious parenchymal signal, masses, mass effect. No abnormal intraparenchymal or extra-axial enhancement. Preserved prepontine cistern. No abnormal extra-axial fluid collections. No extra-axial masses. VASCULAR: Normal major intracranial vascular flow voids present at skull base. SKULL AND UPPER CERVICAL SPINE: Partially empty sella. No suspicious calvarial bone marrow signal. Craniocervical junction maintained. Multiple scalp nodules seen with sebaceous or trichilemmal cysts. SINUSES/ORBITS: LEFT mastoid effusion. Trace sphenoid sinus air-fluid levels. The included ocular globes and orbital contents are non-suspicious. OTHER: None. IMPRESSION: 1. No acute intracranial process. No MR findings of meningitis or intracranial hypotension. 2. Small RIGHT frontal  encephalocele via cribriform plate defect extending to ethmoid sinus corresponding to CT maxillofacial abnormality. Trace sphenoid  sinus air-fluid levels equivocal for CSF rhinorrhea. 3. Partially empty sella. Electronically Signed   By: Elon Alas M.D.   On: 06/27/2017 06:50   Ct Maxillofacial Wo Contrast  Result Date: 06/23/2017 CLINICAL DATA:  Rhinorrhea when tilting the head forward. Nighttime drainage. EXAM: CT MAXILLOFACIAL WITHOUT CONTRAST TECHNIQUE: Multidetector CT imaging of the maxillofacial structures was performed. Multiplanar CT image reconstructions were also generated. COMPARISON:  None. FINDINGS: Frontal sinuses are clear and normal. Maxillary sinuses are clear and normal. Sphenoid sinus is clear and normal. Left ethmoid sinuses are clear and normal. There is abnormality of the posterosuperior ethmoid sinus region with a 2.7 x 2.0 x 1.5 cm region of air cell opacification. There appears to be bone deficiency at the skullbase seen communication with these. There may also be some bone deficiency along the lateral margin of the olfactory recess on the right. Therefore, this could be CSF rhinorrhea. The bony at openings appear diminutive, and I think it is less likely that this is a true encephalocele, but that is not excluded. Consider MRI I to evaluate that. Orbits appear normal. Globes are normal. Optic nerves are normal. Extra-ocular muscles are normal. Other soft tissues of the face are normal. Salivary glands are normal. No pharyngeal lesion is seen. Upper cervical spine is unremarkable. No brain lesion is evident. The sella is prominent. This could go along with increased intracranial pressure. IMPRESSION: Abnormality in the posterosuperior ethmoid region on the right with a 2.7 x 2.0 x 1.5 cm region of sinus opacification. Bone defects at the skullbase and along the lateral margin of the olfactory groove on the right. Therefore, this could be CSF leak age with CSF rhinorrhea. True encephalocele is doubted but not excluded. Consider MRI. Electronically Signed   By: Nelson Chimes M.D.   On: 06/23/2017 12:02    Lab  Data:  CBC:  Recent Labs Lab 06/26/17 1925 06/27/17 0916 06/28/17 0313 06/30/17 0515 07/01/17 0546 07/02/17 0158  WBC 17.1* 16.7* 18.0* 13.8* 22.0* 17.1*  NEUTROABS 13.3*  --   --   --   --   --   HGB 11.4* 10.8* 10.3* 10.6* 9.9* 9.9*  HCT 36.2 33.9* 32.9* 34.0* 31.4* 31.6*  MCV 78.4 77.8* 77.4* 78.0 77.3* 78.6  PLT 258 249 243 255 267 175   Basic Metabolic Panel:  Recent Labs Lab 06/27/17 0916 06/28/17 0313 06/30/17 0515 07/01/17 0546 07/02/17 0158  NA 134* 136 134* 132* 134*  K 3.3* 3.5 3.8 4.2 3.8  CL 101 101 102 102 101  CO2 26 28 26 24 28   GLUCOSE 214* 154* 173* 230* 179*  BUN 7 7 9  5* 5*  CREATININE 1.04* 0.98 0.81 0.81 0.77  CALCIUM 9.0 9.1 8.8* 8.7* 8.8*   GFR: Estimated Creatinine Clearance: 167.8 mL/min (by C-G formula based on SCr of 0.77 mg/dL). Liver Function Tests: No results for input(s): AST, ALT, ALKPHOS, BILITOT, PROT, ALBUMIN in the last 168 hours. No results for input(s): LIPASE, AMYLASE in the last 168 hours. No results for input(s): AMMONIA in the last 168 hours. Coagulation Profile: No results for input(s): INR, PROTIME in the last 168 hours. Cardiac Enzymes: No results for input(s): CKTOTAL, CKMB, CKMBINDEX, TROPONINI in the last 168 hours. BNP (last 3 results) No results for input(s): PROBNP in the last 8760 hours. HbA1C: No results for input(s): HGBA1C in the last 72 hours. CBG:  Recent Labs Lab 07/01/17  0725 07/01/17 1220 07/01/17 1714 07/01/17 2149 07/02/17 0731  GLUCAP 230* 223* 177* 222* 185*   Lipid Profile: No results for input(s): CHOL, HDL, LDLCALC, TRIG, CHOLHDL, LDLDIRECT in the last 72 hours. Thyroid Function Tests: No results for input(s): TSH, T4TOTAL, FREET4, T3FREE, THYROIDAB in the last 72 hours. Anemia Panel: No results for input(s): VITAMINB12, FOLATE, FERRITIN, TIBC, IRON, RETICCTPCT in the last 72 hours. Urine analysis:    Component Value Date/Time   COLORURINE YELLOW 06/26/2017 2230   APPEARANCEUR  CLOUDY (A) 06/26/2017 2230   LABSPEC 1.023 06/26/2017 2230   PHURINE 5.0 06/26/2017 2230   GLUCOSEU NEGATIVE 06/26/2017 2230   HGBUR NEGATIVE 06/26/2017 2230   BILIRUBINUR NEGATIVE 06/26/2017 2230   KETONESUR NEGATIVE 06/26/2017 2230   PROTEINUR 30 (A) 06/26/2017 2230   UROBILINOGEN 0.2 10/05/2014 1300   NITRITE NEGATIVE 06/26/2017 2230   LEUKOCYTESUR LARGE (A) 06/26/2017 2230     Ripudeep Rai M.D. [facility] 07/02/2017, 10:11 AM  Pager: [phone] Between 7am to 7pm - call Pager - [phone]  After 7pm go to [email or web address] - password [email or web address]  Call night coverage person covering after 7pm

## 2017-07-02 NOTE — Anesthesia Postprocedure Evaluation (Signed)
Anesthesia Post Note  Patient: Kendrah Lovern  Procedure(s) Performed: Procedure(s) (LRB): BIFRONTAL CRANIOTOMY (N/A)     Patient location during evaluation: PACU Anesthesia Type: General Level of consciousness: awake and patient cooperative Pain management: pain level controlled Vital Signs Assessment: post-procedure vital signs reviewed and stable Respiratory status: spontaneous breathing, nonlabored ventilation, respiratory function stable and patient connected to nasal cannula oxygen Cardiovascular status: blood pressure returned to baseline and stable Postop Assessment: no apparent nausea or vomiting Anesthetic complications: no    Last Vitals:  Vitals:   07/02/17 1000 07/02/17 1024  BP:  (!) 138/93  Pulse: 78 84  Resp: 18 (!) 22  Temp:    SpO2: 90% 96%    Last Pain:  Vitals:   07/02/17 0930  TempSrc:   PainSc: 3                  Oliver Heitzenrater

## 2017-07-02 NOTE — Progress Notes (Signed)
The Pt. Has arrived to 14m01. Patient was medicated. Pt. Was informed to call for help when needing to get up for any reason.

## 2017-07-02 NOTE — Progress Notes (Signed)
Report called to 75M, spoke with Philis Nettle RN. Patient is going to room 01.all belongings are accounted for.

## 2017-07-03 ENCOUNTER — Encounter (HOSPITAL_COMMUNITY): Payer: Self-pay | Admitting: Neurosurgery

## 2017-07-03 LAB — BASIC METABOLIC PANEL
ANION GAP: 7 (ref 5–15)
BUN: 5 mg/dL — ABNORMAL LOW (ref 6–20)
CALCIUM: 8.9 mg/dL (ref 8.9–10.3)
CHLORIDE: 99 mmol/L — AB (ref 101–111)
CO2: 27 mmol/L (ref 22–32)
Creatinine, Ser: 0.83 mg/dL (ref 0.44–1.00)
GFR calc Af Amer: >60 mL/min (ref >60)
GFR calc non Af Amer: >60 mL/min (ref >60)
GLUCOSE: 181 mg/dL — AB (ref 65–99)
Potassium: 3.8 mmol/L (ref 3.5–5.1)
Sodium: 133 mmol/L — ABNORMAL LOW (ref 135–145)

## 2017-07-03 LAB — CBC
HEMATOCRIT: 32.7 % — AB (ref 36.0–46.0)
HEMOGLOBIN: 10 g/dL — AB (ref 12.0–15.0)
MCH: 24 pg — AB (ref 26.0–34.0)
MCHC: 30.6 g/dL (ref 30.0–36.0)
MCV: 78.6 fL (ref 78.0–100.0)
Platelets: 254 10*3/uL (ref 150–400)
RBC: 4.16 MIL/uL (ref 3.87–5.11)
RDW: 15.7 % — ABNORMAL HIGH (ref 11.5–15.5)
WBC: 17.4 10*3/uL — ABNORMAL HIGH (ref 4.0–10.5)

## 2017-07-03 LAB — GLUCOSE, CAPILLARY: Glucose-Capillary: 180 mg/dL — ABNORMAL HIGH (ref 65–99)

## 2017-07-03 MED ORDER — LEVETIRACETAM 500 MG PO TABS: 500.0000 mg | ORAL_TABLET | Freq: Two times a day (BID) | ORAL | 0 refills | Status: DC

## 2017-07-03 MED ORDER — DOCUSATE SODIUM 100 MG PO CAPS
100.0000 mg | ORAL_CAPSULE | Freq: Two times a day (BID) | ORAL | Status: DC
  Administered 2017-07-03: 100 mg via ORAL
  Filled 2017-07-03: qty 1

## 2017-07-03 NOTE — Op Note (Addendum)
PREOP DIAGNOSIS:  1. CSF Rhinorrhea 2. Right frontal encephalocele   POSTOP DIAGNOSIS: Same  PROCEDURE: 1. Bifrontal craniotomy for repair of CSF leak and anterior skull base defect using vascularized pericranial flap 2. Amputation of right frontal encephalocele  SURGEON: Dr. Consuella Lose, MD  ASSISTANT: Dr. Earnie Larsson, MD  ANESTHESIA: General Endotracheal  EBL: 100cc  SPECIMENS: Right frontal encephalocele  DRAINS: None  COMPLICATIONS: None immediate  CONDITION: Stable to PACU  HISTORY: Judy Nelson is a 31 y.o. female initially admitted to the hospitalist service with several weeks of CSF rhinorrhea.  Workup included CT scan and MRI which demonstrated right anterior skull base defect with a frontal encephalocele.  Surgical repair was therefore indicated.  The risks and benefits of the surgery were explained in detail to the patient and her family.  After all questions were answered informed consent was obtained.  PROCEDURE IN DETAIL: After informed consent was obtained and witnessed, the patient was brought to the operating room. After induction of general anesthesia, the patient was positioned on the operative table in the [place] head holder in the supine position. All pressure points were meticulously padded. Skin incision was then marked out and prepped and draped in the usual sterile fashion.   after time-out was conducted, by coronal skin incision was then infiltrated with local anesthetic with epinephrine.  Incision was then made sharply and carried down through subcutaneous tissue and the galea.  Raney clips are applied for hemostasis.  Temporalis fascia was then incised as was the deep temporal fat pads   Bilaterally.  Skin flap was then reflected anteriorly, with care taken to preserve the pericranium over the frontal bone.  Pericranium was then incised posteriorly and along the superior temporal lines, and the pericranium was elevated and preserved in antibiotic  soaked saline.    At this point, small incisions were made in the temporal fascia and temporalis muscle overlying the keyholes bilaterally.  Bur holes were then created in the keyhole bilaterally, as well as a bur hole over the midline in the frontal bone.  Craniotome was then used to elevate bifrontal craniotomy.  Dura was noted to be significantly adherent to the bone, and small incidental durotomy as were made especially on the left side.  Hemostasis was secured on the epidural surface with bipolar electrocautery and morselized Gelfoam and thrombin.    The posterior table of the frontal sinuses was then removed with high-speed drill, and the mucosa was removed in order to cranialize the sinuses.     At this point the microscope was draped sterilely and brought into the field, and the remainder of the case was done under the microscope using microdissection.  The dura in the midline was dissected away from the Crista galli, and the Crista was removed with rongeur.  Frontal lobe was then elevated on the right side, and a small approximately 0.5 cm defect was identified in the cribriform plate, with herniated orbital frontal gyrus within the ethmoid sinus.  The base of the encephalocele was amputated with bipolar electrocautery, and the herniated portion of frontal lobe was removed from the ethmoid sinus with rongeurs.  This was sent for permanent pathology.    Attention was then turned to repair of the anterior skull base defect.  The hole in the cribriform plate was plugged with a piece of Gel-Foam.  A piece of dura matrix dural graft was then placed over the Gelfoam packed defect.  The  Pericranial flap was then placed over the defect between  the frontal lobe, and the defect.  The region was then sprayed with a layer of Adherus dural sealant.  At this point the wound is irrigated with copious amounts of normal saline irrigation. Good hemostasis was confirmed on the brain and epidural surfaces.  The dura  was then covered with the remainder of the dura matrix dural graft, and the interface was covered with Adherus. The bone flap was then drilled down to remove any sharp bony surfaces, and mucosa was removed from the portion of the frontal sinus within the craniotomy flap.  Bone flap was then replaced and secured with standard titanium plates and screws.  The wound was then again irrigated with antibiotic saline.  The galea was closed with interrupted 0 and 3 0 Vicryl stitches, and the skin was closed with standard surgical skin staples. Bacitracin ointment, sterile dressing, and head wrap was applied after the [place] head holder was removed.  The patient was then extubated, transferred to the stretcher, and taken to the postanesthesia care unit in stable hemodynamic condition.  At the end of the case all sponge, needle, instrument, and cottonoid counts were correct.

## 2017-07-03 NOTE — Evaluation (Signed)
Occupational Therapy Evaluation Patient Details Name: Judy Nelson MRN: [id number] DOB: 26-Oct-1985 Today's Date: 07/03/2017    History of Present Illness 31 y.o. 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  Pt sees dr Benjamine Mola.  He did an analysis of the fluid in the last week or so and determined it was a CSF. Underwent bifrontal craniotomy on 06/30/17. PMH including depression, PTSD, DM types 2, obesity incision and drainage abscess, laparoscopic ovarian cystectomy, and  tympanostomy.    Clinical Impression   PTA, pt was living with her parents, husband, and 4 year old daughter. Pt has PCA who comes in to assist with ADLs and IADLs as needed. Pt currently requiring Max A for LB ADLs and supervision-Min Guard for UB ADLs and functional mobility. Pt reports fatigues and head aches during activity. Pt would benefit from further OT to education on AE and EC and to increase safety and independence with ADLs. Recommend dc home once medically stable per physician.     Follow Up Recommendations  No OT follow up;Supervision/Assistance - 24 hour    Equipment Recommendations  Tub/shower bench    Recommendations for Other Services PT consult     Precautions / Restrictions Precautions Precautions: Other (comment) Precaution Comments: Pt states MD adviced she does not lean forward due to CSF leaking from nose Restrictions Weight Bearing Restrictions: No      Mobility Bed Mobility Overal bed mobility: Modified Independent             General bed mobility comments: HOB elevated and increased time  Transfers Overall transfer level: Needs assistance Equipment used: None Transfers: Sit to/from Stand Sit to Stand: Supervision         General transfer comment: supervision for safety    Balance Overall balance assessment: No apparent balance deficits (not formally assessed)                                         ADL either performed or assessed with  clinical judgement   ADL Overall ADL's : Needs assistance/impaired Eating/Feeding: Set up;Sitting   Grooming: Oral care;Wash/dry hands;Min guard;Standing   Upper Body Bathing: Minimal assistance;Sitting   Lower Body Bathing: Maximal assistance;Sit to/from stand Lower Body Bathing Details (indicate cue type and reason): MD has instructed pt to not lean downward Upper Body Dressing : Minimal assistance;Standing Upper Body Dressing Details (indicate cue type and reason): Donned gown like jacket Lower Body Dressing: Maximal assistance;Sit to/from stand Lower Body Dressing Details (indicate cue type and reason): MD has instructed pt to not lean downward. Pt would benefit from education on AE Toilet Transfer: Ambulation;Regular Toilet;Supervision/safety   Toileting- Clothing Manipulation and Hygiene: Supervision/safety;Sit to/from stand   Tub/ Shower Transfer: Supervision/safety;Ambulation;Tub bench Tub/Shower Transfer Details (indicate cue type and reason): Pt able to step over tub without difficulty.  Functional mobility during ADLs: Supervision/safety General ADL Comments: Pt reports that she fatigues quickly and gets headaches if she does to much at once. Would benefit fro menergy conseravtion handout and education.      Vision Baseline Vision/History: Wears glasses Wears Glasses: At all times Patient Visual Report: No change from baseline Additional Comments: Pt reports having blind spots at times. "Its like when you look at the sun and see black spots":     Perception     Praxis      Pertinent Vitals/Pain Pain Assessment: 0-10 Pain  Score: 6  Pain Location: Head Pain Descriptors / Indicators: Constant;Dull;Grimacing Pain Intervention(s): Monitored during session;Premedicated before session     Hand Dominance Right   Extremity/Trunk Assessment Upper Extremity Assessment Upper Extremity Assessment: Overall WFL for tasks assessed   Lower Extremity Assessment Lower  Extremity Assessment: Defer to PT evaluation   Cervical / Trunk Assessment Cervical / Trunk Assessment: Other exceptions Cervical / Trunk Exceptions: Increased body habitus   Communication Communication Communication: No difficulties   Cognition Arousal/Alertness: Awake/alert Behavior During Therapy: WFL for tasks assessed/performed Overall Cognitive Status: Within Functional Limits for tasks assessed                                     General Comments  Mother present for session    Exercises     Shoulder Instructions      Home Living Family/patient expects to be discharged to:: Private residence Living Arrangements: Parent;Children;Spouse/significant other Available Help at Discharge: Family;Available 24 hours/day Type of Home: House Home Access: Stairs to enter [employer] of Steps: 3 Entrance Stairs-Rails: Can reach both Home Layout: One level     Bathroom Shower/Tub: Corporate investment banker: Standard     Home Equipment: None   Additional Comments: Has prescription for DME but havent had time to get DME      Prior Functioning/Environment Level of Independence: Needs assistance  Gait / Transfers Assistance Needed: No DME ADL's / Homemaking Assistance Needed: Has a PCA who assist with ADLs and IADLs. Has not been working for past 4 years due to illness.    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."        OT Problem List: Decreased range of motion;Decreased activity tolerance;Impaired balance (sitting and/or standing);Decreased knowledge of use of DME or AE;Pain;Obesity      OT Treatment/Interventions: Self-care/ADL training;Therapeutic exercise;Energy conservation;DME and/or AE instruction;Therapeutic activities;Patient/family education    OT Goals(Current goals can be found in the care plan section) Acute Rehab OT Goals Patient Stated Goal: Go home OT Goal Formulation: With patient Time For Goal  Achievement: 07/17/17 Potential to Achieve Goals: Good ADL Goals Pt Will Perform Lower Body Bathing: with adaptive equipment;sit to/from stand;with modified independence Pt Will Perform Lower Body Dressing: with modified independence;with adaptive equipment;sit to/from stand Additional ADL Goal #1: Pt will independently verbalize 3 energy conseravtion techniques  OT Frequency: Min 2X/week   Barriers to D/C:            Co-evaluation              AM-PAC PT "6 Clicks" Daily Activity     Outcome Measure Help from another person eating meals?: None Help from another person taking care of personal grooming?: A Little Help from another person toileting, which includes using toliet, bedpan, or urinal?: A Little Help from another person bathing (including washing, rinsing, drying)?: A Lot Help from another person to put on and taking off regular upper body clothing?: A Little Help from another person to put on and taking off regular lower body clothing?: A Lot 6 Click Score: 17   End of Session Nurse Communication: Mobility status  Activity Tolerance: Patient tolerated treatment well Patient left:  (with PT)  OT Visit Diagnosis: Unsteadiness on feet (R26.81);Pain Pain - Right/Left:  (Head) Pain - part of body:  (Head)  Time: 5947-0761 OT Time Calculation (min): 23 min Charges:  OT General Charges $OT Visit: 1 Visit OT Evaluation $OT Eval Moderate Complexity: 1 Mod OT Treatments $Self Care/Home Management : 8-22 mins G-Codes:     Adeena Bernabe MSOT, OTR/L Acute Rehab Pager: [phone] Office: [place] 07/03/2017, 8:56 AM

## 2017-07-03 NOTE — Progress Notes (Deleted)
Psychiatric Initial Adult Assessment   Patient Identification: Judy Nelson MRN:  [id number] Date of Evaluation:  07/03/2017 Referral Source: *** Chief Complaint:   Visit Diagnosis: No diagnosis found.  History of Present Illness:   Judy Nelson is a 31 year old female with anxiety, PTSD per chart, frontal encephalocele s/p craniotomy for repair, who is referred for   Associated Signs/Symptoms: Depression Symptoms:  {DEPRESSION SYMPTOMS:20000} (Hypo) Manic Symptoms:  {BHH MANIC SYMPTOMS:22872} Anxiety Symptoms:  {BHH ANXIETY SYMPTOMS:22873} Psychotic Symptoms:  {BHH PSYCHOTIC SYMPTOMS:22874} PTSD Symptoms: {BHH PTSD FMBWGYKZ:99357}  Past Psychiatric History:  .Outpatient:  Psychiatry admission:  Previous suicide attempt:  Past trials of medication:  History of violence:   Previous Psychotropic Medications: {YES/NO:21197}  Substance Abuse History in the last 12 months:  {yes no:314532}  Consequences of Substance Abuse: {BHH CONSEQUENCES OF SUBSTANCE ABUSE:22880}  Past Medical History:  Past Medical History:  Diagnosis Date  . Anxiety   . Depression   . Heartburn 09/01/2015   no current med.  Marland Kitchen History of anemia 01/2014  . Non-insulin dependent type 2 diabetes mellitus ([place])   . Obesity   . PTSD (post-traumatic stress disorder)   . Scar of breast 08/2015   left  . Sinus headache     Past Surgical History:  Procedure Laterality Date  . BREAST SURGERY    . INCISION AND DRAINAGE ABSCESS Left 10/07/2014   Procedure: INCISION AND DRAINAGE LEFT BREAST ABSCESS;  Surgeon: Jamesetta So, MD;  Location: [facility];  Service: General;  Laterality: Left;  . LAPAROSCOPIC OVARIAN CYSTECTOMY Right 02/05/2014   Procedure: LAPAROSCOPIC right retroperitoneal (NOT OVARIAN) CYSTECTOMY;  Surgeon: Florian Buff, MD;  Location: [facility];  Service: Gynecology;  Laterality: Right;  . MASS EXCISION Left 09/07/2015   Procedure: Minor SCAR EXCISION LEFT BREAST, PLASTIC CLOSURE;  Surgeon:  Cristine Polio, MD;  Location: [facility];  Service: Plastics;  Laterality: Left;  . TYMPANOSTOMY TUBE PLACEMENT Bilateral 10/2014    Family Psychiatric History: ***  Family History:  Family History  Problem Relation Age of Onset  . Diabetes Father   . Hypertension Father   . Heart disease Daughter        heart murmur  . Cancer Maternal Grandmother        ovarian cancer  . Obesity Paternal Grandmother   . Diabetes Paternal Grandfather     Social History:   Social History   Social History  . Marital status: Single    Spouse name: N/A  . Number of children: N/A  . Years of education: N/A   Social History Main Topics  . Smoking status: Current Every Day Smoker    Packs/day: 0.50    Years: 7.00    Types: Cigarettes  . Smokeless tobacco: Never Used  . Alcohol use Yes     Comment: occasionally  . Drug use: No  . Sexual activity: Yes    Birth control/ protection: Implant   Other Topics Concern  . Not on file   Social History Narrative  . No narrative on file    Additional Social History: ***  Allergies:  No Known Allergies  Metabolic Disorder Labs: Lab Results  Component Value Date   HGBA1C 6.8 (H) 07/20/2015   MPG 180 (H) 10/05/2014   No results found for: PROLACTIN Lab Results  Component Value Date   CHOL 190 07/20/2015   TRIG 99 07/20/2015   HDL 43 07/20/2015   CHOLHDL 4.4 07/20/2015   LDLCALC 127 (H) 07/20/2015  Current Medications: No current facility-administered medications for this visit.    Current Outpatient Prescriptions  Medication Sig Dispense Refill  . levETIRAcetam (KEPPRA) 500 MG tablet Take 1 tablet (500 mg total) by mouth 2 (two) times daily. 60 tablet 0   Facility-Administered Medications Ordered in Other Visits  Medication Dose Route Frequency Provider Last Rate Last Dose  . 0.9 %  sodium chloride infusion  250 mL Intravenous PRN Phillips Grout, MD   Stopped at 06/30/17 1708  . 0.9 %  sodium chloride  infusion   Intravenous Continuous Annye Asa, MD   Stopped at 07/01/17 2015  . acetaminophen (TYLENOL) tablet 650 mg  650 mg Oral Q6H PRN Rai, Ripudeep K, MD      . albuterol (PROVENTIL) (2.5 MG/3ML) 0.083% nebulizer solution 2.5 mg  2.5 mg Nebulization Q6H PRN Rai, Ripudeep K, MD      . ALPRAZolam Duanne Moron) tablet 1 mg  1 mg Oral TID PRN Opyd, Ilene Qua, MD   1 mg at 07/02/17 2111  . docusate sodium (COLACE) capsule 100 mg  100 mg Oral BID Consuella Lose, MD   100 mg at 07/03/17 0919  . escitalopram (LEXAPRO) tablet 10 mg  10 mg Oral Daily Consuella Lose, MD   10 mg at 07/03/17 0919  . guaiFENesin (ROBITUSSIN) 100 MG/5ML solution 100 mg  5 mL Oral Q4H PRN Consuella Lose, MD   100 mg at 07/01/17 2007  . HYDROcodone-acetaminophen (NORCO/VICODIN) 5-325 MG per tablet 1-2 tablet  1-2 tablet Oral Q4H PRN Rai, Vernelle Emerald, MD   2 tablet at 07/03/17 0818  . insulin aspart (novoLOG) injection 0-9 Units  0-9 Units Subcutaneous TID WC Phillips Grout, MD   2 Units at 07/03/17 [phone]  . insulin aspart (novoLOG) injection 3 Units  3 Units Subcutaneous TID WC Rai, Ripudeep K, MD   3 Units at 07/03/17 0659  . insulin glargine (LANTUS) injection 15 Units  15 Units Subcutaneous Q24H Rai, Ripudeep K, MD   15 Units at 07/02/17 1411  . linagliptin (TRADJENTA) tablet 5 mg  5 mg Oral Daily Consuella Lose, MD   5 mg at 07/03/17 0919  . morphine 4 MG/ML injection 4 mg  4 mg Intravenous Q2H PRN Consuella Lose, MD   4 mg at 07/03/17 0424  . nicotine (NICODERM CQ - dosed in mg/24 hours) patch 21 mg  21 mg Transdermal Daily Mikhail, Maryann, DO   21 mg at 07/02/17 0900  . ondansetron (ZOFRAN) tablet 4 mg  4 mg Oral Q4H PRN Consuella Lose, MD       Or  . ondansetron (ZOFRAN) injection 4 mg  4 mg Intravenous Q4H PRN Consuella Lose, MD   4 mg at 07/02/17 0519  . pantoprazole (PROTONIX) EC tablet 40 mg  40 mg Oral QHS Rai, Ripudeep K, MD   40 mg at 07/02/17 2109  . polyethylene glycol (MIRALAX /  GLYCOLAX) packet 17 g  17 g Oral Daily PRN Rai, Ripudeep K, MD      . promethazine (PHENERGAN) tablet 12.5-25 mg  12.5-25 mg Oral Q4H PRN Consuella Lose, MD      . senna-docusate (Senokot-S) tablet 1 tablet  1 tablet Oral BID Rai, Ripudeep K, MD   1 tablet at 07/03/17 0920  . sodium chloride flush (NS) 0.9 % injection 3 mL  3 mL Intravenous Q12H Derrill Kay A, MD   3 mL at 07/03/17 0920  . sodium chloride flush (NS) 0.9 % injection 3 mL  3 mL Intravenous  PRN Phillips Grout, MD      . SUMAtriptan (IMITREX) tablet 50 mg  50 mg Oral Q2H PRN Consuella Lose, MD   50 mg at 07/02/17 0758  . traMADol (ULTRAM) tablet 50 mg  50 mg Oral Q6H PRN Rai, Ripudeep K, MD   50 mg at 07/02/17 0413    Neurologic: Headache: No Seizure: No Paresthesias:No  Musculoskeletal: Strength & Muscle Tone: within normal limits Gait & Station: normal Patient leans: N/A  Psychiatric Specialty Exam: ROS  unknown if currently breastfeeding.There is no height or weight on file to calculate BMI.  General Appearance: Fairly Groomed  Eye Contact:  Good  Speech:  Clear and Coherent  Volume:  Normal  Mood:  {BHH MOOD:22306}  Affect:  {Affect (PAA):22687}  Thought Process:  Coherent and Goal Directed  Orientation:  Full (Time, Place, and Person)  Thought Content:  Logical  Suicidal Thoughts:  {ST/HT (PAA):22692}  Homicidal Thoughts:  {ST/HT (PAA):22692}  Memory:  Immediate;   Good Recent;   Good Remote;   Good  Judgement:  {Judgement (PAA):22694}  Insight:  {Insight (PAA):22695}  Psychomotor Activity:  Normal  Concentration:  Concentration: Good and Attention Span: Good  Recall:  Good  Fund of Knowledge:Good  Language: Good  Akathisia:  No  Handed:  Right  AIMS (if indicated):  N/A  Assets:  Communication Skills Desire for Improvement  ADL's:  Intact  Cognition: WNL  Sleep:  ***   Assessment  Plan  The patient demonstrates the following risk factors for suicide: Chronic risk factors for  suicide include: {Chronic Risk Factors for LJQGBEE:10071219}. Acute risk factors for suicide include: {Acute Risk Factors for XJOITGP:49826415}. Protective factors for this patient include: {Protective Factors for Suicide AXEN:40768088}. Considering these factors, the overall suicide risk at this point appears to be {Desc; low/moderate/high:110033}. Patient {ACTION; IS/IS PJS:31594585} appropriate for outpatient follow up.   Treatment Plan Summary: {CHL AMB [facility] MD [place] FYTW:4462863817}   Norman Clay, MD 9/17/201811:23 AM

## 2017-07-03 NOTE — Progress Notes (Signed)
No issues overnight. Reports no fluid drainage from nose. Has HA, not postural and different from prior to surgery.  EXAM:  BP 127/78 (BP Location: Left Wrist)   Pulse 94   Temp 98.4 F (36.9 C) (Oral)   Resp 18   Ht 5\' 7"  (1.702 m)   Wt (!) 166.1 kg (366 lb 2.9 oz)   SpO2 97%   BMI 57.35 kg/m   Awake, alert, oriented  Speech fluent, appropriate  CN grossly intact  5/5 BUE/BLE  Wound c/d/i  IMPRESSION:  31 y.o. female s/p bifrontal crani for CSF leak with encephalocele, doing well. No CSF leak postop.  PLAN: - D/C Home today - Will d/c on Keppra. She's on percocet at home on the single prescriber program, asked her to f/u with PCP if/when she needs pain medication refill. - F/U with me in 2 weeks for staples

## 2017-07-03 NOTE — Progress Notes (Addendum)
IV removed. Discharge instructions given, questions answered.  Patient awaiting ride home at this time.   1215 pt d/c'd home via car at this time.

## 2017-07-03 NOTE — Plan of Care (Signed)
Patient seen this morning, stable from neuro surgery standpoint, ambulating in the hallway without any assistance. Discharged by neurosurgery, Dr Kathyrn Sheriff this am, DC summary done. Appreciate neurosurgery management.    Estill Cotta M.D. [facility] 07/03/2017, 10:06 AM  Pager: [phone]

## 2017-07-03 NOTE — Discharge Summary (Signed)
Physician Discharge Summary  Patient ID: Judy Nelson MRN: [id number] DOB/AGE: Jun 24, 1986 31 y.o.  Admit date: 06/26/2017 Discharge date: 07/03/2017  Admission Diagnoses:  CSF Leak  Discharge Diagnoses:  Right frontal encephalocele Principal Problem:   CSF leak Active Problems:   Morbid obesity with BMI of 50.0-59.9, adult ([place])   Type 2 diabetes mellitus without complication ([facility])   Leukocytosis   Neck pain   Anxiety   Discharged Condition: Stable  Hospital Course:  Judy Nelson is a 31 y.o. female admitted with 6 weeks of CSF leak. MRI demonstrated a frontal encephalocele. She underwent craniotomy for repair without complications. She reported no further CSF leak postop. She was observed in the ICU, transferred to floor, and subsequently discharged in stable condition. She was ambulating well, tolerating diet, voiding normally.  Treatments: Surgery - bifrontal crani, repair of CSF leak.  Discharge Exam: Blood pressure 127/78, pulse 94, temperature 98.4 F (36.9 C), temperature source Oral, resp. rate 18, height 5\' 7"  (1.702 m), weight (!) 166.1 kg (366 lb 2.9 oz), SpO2 97 %, unknown if currently breastfeeding. Awake, alert, oriented Speech fluent, appropriate CN grossly intact 5/5 BUE/BLE Wound c/d/i  Disposition: 01-Home or Self Care  Discharge Instructions    Call MD for:  redness, tenderness, or signs of infection (pain, swelling, redness, odor or green/yellow discharge around incision site)    Complete by:  As directed    Call MD for:  temperature >100.4    Complete by:  As directed    Diet - low sodium heart healthy    Complete by:  As directed    Discharge instructions    Complete by:  As directed    Walk at home as much as possible, at least 4 times / day   Increase activity slowly    Complete by:  As directed    Lifting restrictions    Complete by:  As directed    No lifting > 10 lbs   May shower / Bathe    Complete by:  As directed    48 hours  after surgery   May walk up steps    Complete by:  As directed    No dressing needed    Complete by:  As directed    Other Restrictions    Complete by:  As directed    No bending/twisting at waist     Allergies as of 07/03/2017   No Known Allergies     Medication List    TAKE these medications   albuterol 108 (90 Base) MCG/ACT inhaler Commonly known as:  PROVENTIL HFA;VENTOLIN HFA Inhale 2 puffs into the lungs every 6 (six) hours as needed for wheezing or shortness of breath.   ALPRAZolam 1 MG tablet Commonly known as:  XANAX TAKE 1 TABLET BY MOUTH THREE TIMES DAILY   clonazePAM 1 MG tablet Commonly known as:  KLONOPIN Take 1 mg by mouth 3 (three) times daily.   escitalopram 10 MG tablet Commonly known as:  LEXAPRO Take 10 mg by mouth daily.   levETIRAcetam 500 MG tablet Commonly known as:  KEPPRA Take 1 tablet (500 mg total) by mouth 2 (two) times daily.   sitaGLIPtin 50 MG tablet Commonly known as:  JANUVIA Take 50 mg by mouth daily.   SUMAtriptan 50 MG tablet Commonly known as:  IMITREX Take 50 mg by mouth every 2 (two) hours as needed for migraine. May repeat in 2 hours if headache persists or recurs.   traMADol 50 MG tablet Commonly  known as:  ULTRAM Take 2 tablets (100 mg total) by mouth every 6 (six) hours as needed.   venlafaxine XR 150 MG 24 hr capsule Commonly known as:  EFFEXOR-XR TAKE 2 CAPSULES BY MOUTH EVERY DAY   venlafaxine XR 150 MG 24 hr capsule Commonly known as:  EFFEXOR-XR TAKE 2 CAPSULES BY MOUTH EVERY DAY            Discharge Care Instructions        Start     Ordered   07/03/17 0000  Discharge instructions    Comments:  Walk at home as much as possible, at least 4 times / day   07/03/17 0928   07/03/17 0000  Increase activity slowly     07/03/17 0928   07/03/17 0000  May walk up steps     07/03/17 0928   07/03/17 0000  May shower / Bathe    Comments:  48 hours after surgery   07/03/17 0928   07/03/17 0000  Lifting  restrictions    Comments:  No lifting > 10 lbs   07/03/17 0928   07/03/17 0000  Other Restrictions    Comments:  No bending/twisting at waist   07/03/17 0928   07/03/17 0000  Diet - low sodium heart healthy     07/03/17 0928   07/03/17 0000  No dressing needed     07/03/17 0928   07/03/17 0000  Call MD for:  temperature >100.4     07/03/17 0928   07/03/17 0000  Call MD for:  redness, tenderness, or signs of infection (pain, swelling, redness, odor or green/yellow discharge around incision site)     07/03/17 0928   07/03/17 0000  levETIRAcetam (KEPPRA) 500 MG tablet  2 times daily     07/03/17 4503     Follow-up Information    Consuella Lose, MD. Schedule an appointment as soon as possible for a visit in 2 week(s).   Specialty:  Neurosurgery Contact information: [address] [place] [address] [place] [address] [phone]           Signed: Consuella Lose, Loletha Grayer 07/03/2017, 9:29 AM

## 2017-07-03 NOTE — Progress Notes (Signed)
Patient having some pain issues overnight, gave incentive spirometer and ambulated in hall this AM she walked un assisted 100 feet and tolerated it well.

## 2017-07-03 NOTE — Care Management Note (Signed)
Case Management Note  Patient Details  Name: Judy Nelson MRN: [id number] Date of Birth: 12/08/1985  Subjective/Objective:                    Action/Plan: Pt discharging home with self care. OT recommended a tub bench. Patients mother states she has a prescription for a tub bench that she has not gotten filled yet.  No f/u per PT/OT. Pt has transportation home today.  Expected Discharge Date:  07/03/17               Expected Discharge Plan:  Home/Self Care  In-House Referral:     Discharge planning Services  CM Consult  Post Acute Care Choice:    Choice offered to:     DME Arranged:    DME Agency:     HH Arranged:    HH Agency:     Status of Service:  Completed, signed off  If discussed at [employer] of Stay Meetings, dates discussed:    Additional Comments:  Pollie Friar, RN 07/03/2017, 11:20 AM

## 2017-07-03 NOTE — Evaluation (Signed)
Physical Therapy Evaluation Patient Details Name: Judy Nelson MRN: [id number] DOB: 06/09/1986 Today's Date: 07/03/2017   History of Present Illness  30 y.o. 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  Pt sees dr Benjamine Mola.  He did an analysis of the fluid in the last week or so and determined it was a CSF. Underwent bifrontal craniotomy on 06/30/17. PMH including depression, PTSD, DM types 2, obesity incision and drainage abscess, laparoscopic ovarian cystectomy, and  tympanostomy.  Clinical Impression  Orders received for PT evaluation. Patient demonstrates good overall functional mobility. Educated patient on precautions and discussed effects of over stimulation during recovery. Patient receptive. At this time, anticipate patient will be safe for d/c home. No further acute needs, will sign off.     Follow Up Recommendations No PT follow up    Equipment Recommendations  None recommended by PT    Recommendations for Other Services       Precautions / Restrictions Precautions Precautions: Other (comment) Precaution Comments: Pt states MD adviced she does not lean forward due to CSF leaking from nose Restrictions Weight Bearing Restrictions: No      Mobility  Bed Mobility Overal bed mobility: Modified Independent             General bed mobility comments: HOB elevated and increased time  Transfers Overall transfer level: Needs assistance Equipment used: None Transfers: Sit to/from Stand Sit to Stand: Supervision         General transfer comment: supervision for safety  Ambulation/Gait Ambulation/Gait assistance: Supervision Ambulation Distance (Feet): 210 Feet Assistive device: None Gait Pattern/deviations: WFL(Within Functional Limits) Gait velocity: decreased   General Gait Details: steady with ambulation supervision for safety  Stairs Stairs: Yes Stairs assistance: Supervision Stair Management: One rail Right Number of Stairs: 5 General  stair comments: no difficulty, supervision for safety  Wheelchair Mobility    Modified Rankin (Stroke Patients Only)       Balance Overall balance assessment: No apparent balance deficits (not formally assessed)                                           Pertinent Vitals/Pain Pain Assessment: 0-10 Pain Score: 6  Pain Location: Head Pain Descriptors / Indicators: Constant;Dull;Grimacing Pain Intervention(s): Monitored during session;Premedicated before session    Home Living Family/patient expects to be discharged to:: Private residence Living Arrangements: Parent;Children;Spouse/significant other Available Help at Discharge: Family;Available 24 hours/day Type of Home: House Home Access: Stairs to enter Entrance Stairs-Rails: Can reach both Entrance Stairs-Number of Steps: 3 Home Layout: One level Home Equipment: None Additional Comments: Has prescription for DME but havent had time to get DME    Prior Function Level of Independence: Needs assistance   Gait / Transfers Assistance Needed: No DME  ADL's / Homemaking Assistance Needed: Has a PCA who assist with ADLs and IADLs. Has not been working for past 4 years due to illness.   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."     Hand Dominance   Dominant Hand: Right    Extremity/Trunk Assessment   Upper Extremity Assessment Upper Extremity Assessment: Overall WFL for tasks assessed    Lower Extremity Assessment Lower Extremity Assessment: Overall WFL for tasks assessed    Cervical / Trunk Assessment Cervical / Trunk Assessment: Other exceptions Cervical / Trunk Exceptions: Increased body habitus  Communication  Communication: No difficulties  Cognition Arousal/Alertness: Awake/alert Behavior During Therapy: WFL for tasks assessed/performed Overall Cognitive Status: Within Functional Limits for tasks assessed                                        General  Comments General comments (skin integrity, edema, etc.): Mother present for session    Exercises     Assessment/Plan    PT Assessment Patent does not need any further PT services  PT Problem List         PT Treatment Interventions      PT Goals (Current goals can be found in the Care Plan section)  Acute Rehab PT Goals Patient Stated Goal: Go home PT Goal Formulation: All assessment and education complete, DC therapy    Frequency     Barriers to discharge        Co-evaluation               AM-PAC PT "6 Clicks" Daily Activity  Outcome Measure Difficulty turning over in bed (including adjusting bedclothes, sheets and blankets)?: A Little Difficulty moving from lying on back to sitting on the side of the bed? : A Little Difficulty sitting down on and standing up from a chair with arms (e.g., wheelchair, bedside commode, etc,.)?: A Little Help needed moving to and from a bed to chair (including a wheelchair)?: None Help needed walking in hospital room?: None Help needed climbing 3-5 steps with a railing? : None 6 Click Score: 21    End of Session   Activity Tolerance: Patient tolerated treatment well Patient left: in bed;with call bell/phone within reach;with family/visitor present Nurse Communication: Mobility status (okay to mobilize without assist) PT Visit Diagnosis: Other symptoms and signs involving the nervous system (R29.898)    Time: 9242-6834 PT Time Calculation (min) (ACUTE ONLY): 15 min   Charges:   PT Evaluation $PT Eval Low Complexity: 1 Low     PT G Codes:        Alben Deeds, PT DPT  Board Certified Neurologic Specialist [phone]   Duncan Dull 07/03/2017, 8:57 AM

## 2017-07-05 ENCOUNTER — Ambulatory Visit (HOSPITAL_COMMUNITY): Payer: Self-pay | Admitting: Psychiatry

## 2017-07-24 ENCOUNTER — Ambulatory Visit (INDEPENDENT_AMBULATORY_CARE_PROVIDER_SITE_OTHER): Payer: Self-pay | Admitting: Otolaryngology

## 2017-08-28 ENCOUNTER — Other Ambulatory Visit: Payer: Self-pay | Admitting: Physician Assistant

## 2017-08-28 ENCOUNTER — Ambulatory Visit
Admission: RE | Admit: 2017-08-28 | Discharge: 2017-08-28 | Disposition: A | Discharge status: Home / Self Care | Payer: Medicaid Other | Source: Ambulatory Visit | Attending: Physician Assistant | Admitting: Physician Assistant

## 2017-08-28 DIAGNOSIS — Q019 Encephalocele, unspecified: Secondary | ICD-10-CM

## 2017-08-28 MED ORDER — GADOBENATE DIMEGLUMINE 529 MG/ML IV SOLN
20.0000 mL | Freq: Once | INTRAVENOUS | Status: AC | PRN
  Administered 2017-08-28: 20 mL via INTRAVENOUS

## 2018-02-28 ENCOUNTER — Other Ambulatory Visit: Payer: Self-pay

## 2018-02-28 ENCOUNTER — Emergency Department (HOSPITAL_COMMUNITY)
Admission: EM | Admit: 2018-02-28 | Discharge: 2018-02-28 | Disposition: A | Discharge status: Home / Self Care | Payer: Medicaid Other | Attending: Emergency Medicine | Admitting: Emergency Medicine

## 2018-02-28 ENCOUNTER — Emergency Department (HOSPITAL_COMMUNITY): Payer: Medicaid Other

## 2018-02-28 ENCOUNTER — Encounter (HOSPITAL_COMMUNITY): Payer: Self-pay | Admitting: *Deleted

## 2018-02-28 DIAGNOSIS — Y92009 Unspecified place in unspecified non-institutional (private) residence as the place of occurrence of the external cause: Secondary | ICD-10-CM | POA: Diagnosis not present

## 2018-02-28 DIAGNOSIS — Z7984 Long term (current) use of oral hypoglycemic drugs: Secondary | ICD-10-CM | POA: Diagnosis not present

## 2018-02-28 DIAGNOSIS — W010XXA Fall on same level from slipping, tripping and stumbling without subsequent striking against object, initial encounter: Secondary | ICD-10-CM | POA: Insufficient documentation

## 2018-02-28 DIAGNOSIS — M25551 Pain in right hip: Secondary | ICD-10-CM | POA: Insufficient documentation

## 2018-02-28 DIAGNOSIS — E119 Type 2 diabetes mellitus without complications: Secondary | ICD-10-CM | POA: Insufficient documentation

## 2018-02-28 DIAGNOSIS — Z79899 Other long term (current) drug therapy: Secondary | ICD-10-CM | POA: Insufficient documentation

## 2018-02-28 DIAGNOSIS — M25561 Pain in right knee: Secondary | ICD-10-CM | POA: Diagnosis present

## 2018-02-28 DIAGNOSIS — F17210 Nicotine dependence, cigarettes, uncomplicated: Secondary | ICD-10-CM | POA: Insufficient documentation

## 2018-02-28 DIAGNOSIS — W19XXXA Unspecified fall, initial encounter: Secondary | ICD-10-CM

## 2018-02-28 DIAGNOSIS — Y998 Other external cause status: Secondary | ICD-10-CM | POA: Insufficient documentation

## 2018-02-28 DIAGNOSIS — Y939 Activity, unspecified: Secondary | ICD-10-CM | POA: Insufficient documentation

## 2018-02-28 LAB — POC URINE PREG, ED: Preg Test, Ur: NEGATIVE

## 2018-02-28 MED ORDER — IBUPROFEN 800 MG PO TABS: 800.0000 mg | ORAL_TABLET | Freq: Three times a day (TID) | ORAL | 0 refills | Status: DC

## 2018-02-28 MED ORDER — IBUPROFEN 800 MG PO TABS
800.0000 mg | ORAL_TABLET | Freq: Once | ORAL | Status: AC
  Administered 2018-02-28: 800 mg via ORAL
  Filled 2018-02-28: qty 1

## 2018-02-28 NOTE — ED Notes (Signed)
T/C to Xray, advised urine preg NEGATIVE

## 2018-02-28 NOTE — ED Notes (Signed)
Ace wrap applied to R knee

## 2018-02-28 NOTE — Discharge Instructions (Signed)
As discussed, your x-rays did not show any acute fractures or dislocation.  There is evidence of arthritis in your hips bilaterally.  Take ibuprofen every 8 hours as needed and follow-up with your primary care provider if symptoms persist.

## 2018-02-28 NOTE — ED Provider Notes (Signed)
[facility] EMERGENCY DEPARTMENT Provider Note   CSN: [id number] Arrival date & time: 02/28/18  1900     History   Chief Complaint Chief Complaint  Patient presents with  . Fall    HPI Judy Nelson is a 32 y.o. female with past medical history significant for anxiety/depression, type 2 diabetes, morbid obesity, presenting with right knee and right hip pain after a slip and fall at 6 AM this morning.  She explains that she was getting up to go the bathroom when she slipped and fell and her leg was trapped under herself and she thought that she had broken something.  She reports getting herself back up and refusing to go to the hospital initially because she thought she would improve.  Has not taken anything for her symptoms today and states that her symptoms have worsened throughout the day.  No numbness or tingling, head trauma or loss of consciousness.  She has been ambulating all day.  Her pain is aggravated by weightbearing and she states that the worst of her pain is located at the medial aspect of her right knee.  HPI  Past Medical History:  Diagnosis Date  . Anxiety   . Depression   . Heartburn 09/01/2015   no current med.  Marland Kitchen History of anemia 01/2014  . Non-insulin dependent type 2 diabetes mellitus ([place])   . Obesity   . PTSD (post-traumatic stress disorder)   . Scar of breast 08/2015   left  . Sinus headache     Patient Active Problem List   Diagnosis Date Noted  . 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 Leukocytosis   . Neck pain   . PTSD (post-traumatic stress disorder)   . Anxiety   . Left breast abscess 10/05/2014  . Type 2 diabetes mellitus without complication ([place]) 86/76/1950  . Hypokalemia 10/05/2014  . Abscess of breast 10/05/2014  . Depression 06/19/2014  . Anemia 02/23/2014  . Postpartum depression 12/23/2013  . Preeclampsia 11/01/2013  . Pelvic mass 04/15/2013  . Morbid obesity with BMI of 50.0-59.9, adult ([place]) 04/15/2013    Past Surgical History:    Procedure Laterality Date  . BREAST SURGERY    . CRANIOTOMY N/A 06/30/2017   Procedure: BIFRONTAL CRANIOTOMY;  Surgeon: Consuella Lose, MD;  Location: [place];  Service: Neurosurgery;  Laterality: N/A;  BIFRONTAL CRANIOTOMY Repair of Encephalocele  . INCISION AND DRAINAGE ABSCESS Left 10/07/2014   Procedure: INCISION AND DRAINAGE LEFT BREAST ABSCESS;  Surgeon: Jamesetta So, MD;  Location: [facility];  Service: General;  Laterality: Left;  . LAPAROSCOPIC OVARIAN CYSTECTOMY Right 02/05/2014   Procedure: LAPAROSCOPIC right retroperitoneal (NOT OVARIAN) CYSTECTOMY;  Surgeon: Florian Buff, MD;  Location: [facility];  Service: Gynecology;  Laterality: Right;  . MASS EXCISION Left 09/07/2015   Procedure: Minor SCAR EXCISION LEFT BREAST, PLASTIC CLOSURE;  Surgeon: Cristine Polio, MD;  Location: [place];  Service: Plastics;  Laterality: Left;  . TYMPANOSTOMY TUBE PLACEMENT Bilateral 10/2014     OB History    Gravida  1   Para  1   Term  1   Preterm      AB      Living  1     SAB      TAB      Ectopic      Multiple      Live Births  1            Home Medications    Prior to Admission medications  Medication Sig Start Date End Date Taking? Authorizing Provider  albuterol (PROVENTIL HFA;VENTOLIN HFA) 108 (90 Base) MCG/ACT inhaler Inhale 2 puffs into the lungs every 6 (six) hours as needed for wheezing or shortness of breath.    [provider]  ALPRAZolam Duanne Moron) 1 MG tablet TAKE 1 TABLET BY MOUTH THREE TIMES DAILY Patient not taking: Reported on 06/27/2017 07/26/16   Florian Buff, MD  clonazePAM (KLONOPIN) 1 MG tablet Take 1 mg by mouth 3 (three) times daily.    [provider]  escitalopram (LEXAPRO) 10 MG tablet Take 10 mg by mouth daily.    [provider]  ibuprofen (ADVIL,MOTRIN) 800 MG tablet Take 1 tablet (800 mg total) by mouth 3 (three) times daily. 02/28/18   Emeline General, PA-C  levETIRAcetam (KEPPRA) 500 MG tablet  Take 1 tablet (500 mg total) by mouth 2 (two) times daily. 07/03/17   Consuella Lose, MD  sitaGLIPtin (JANUVIA) 50 MG tablet Take 50 mg by mouth daily.    [provider]  SUMAtriptan (IMITREX) 50 MG tablet Take 50 mg by mouth every 2 (two) hours as needed for migraine. May repeat in 2 hours if headache persists or recurs.    [provider]  traMADol (ULTRAM) 50 MG tablet Take 2 tablets (100 mg total) by mouth every 6 (six) hours as needed. Patient not taking: Reported on 06/27/2017 02/23/16   Rolland Porter, MD  venlafaxine XR (EFFEXOR-XR) 150 MG 24 hr capsule TAKE 2 CAPSULES BY MOUTH EVERY DAY Patient not taking: Reported on 06/27/2017 06/09/15   Florian Buff, MD  venlafaxine XR (EFFEXOR-XR) 150 MG 24 hr capsule TAKE 2 CAPSULES BY MOUTH EVERY DAY Patient not taking: Reported on 06/27/2017 10/21/16   Florian Buff, MD    Family History Family History  Problem Relation Age of Onset  . Diabetes Father   . Hypertension Father   . Heart disease Daughter        heart murmur  . Cancer Maternal Grandmother        ovarian cancer  . Obesity Paternal Grandmother   . Diabetes Paternal Grandfather     Social History Social History   Tobacco Use  . Smoking status: Current Every Day Smoker    Packs/day: 0.50    Years: 7.00    Pack years: 3.50    Types: Cigarettes  . Smokeless tobacco: Never Used  Substance Use Topics  . Alcohol use: Yes    Comment: occasionally  . Drug use: No     Allergies   Patient has no known allergies.   Review of Systems Review of Systems  Constitutional: Negative for chills and fever.  HENT: Negative for facial swelling.   Respiratory: Negative for cough, choking, chest tightness, shortness of breath, wheezing and stridor.   Cardiovascular: Negative for chest pain and palpitations.  Gastrointestinal: Negative for abdominal distention, abdominal pain, nausea and vomiting.  Genitourinary: Negative for difficulty urinating.  Musculoskeletal:  Positive for arthralgias and joint swelling. Negative for back pain, myalgias, neck pain and neck stiffness.  Skin: Negative for color change, pallor, rash and wound.  Neurological: Negative for dizziness, syncope, weakness, light-headedness, numbness and headaches.     Physical Exam Updated Vital Signs BP (!) 143/86 (BP Location: Right Arm)   Pulse 95   Temp 98.3 F (36.8 C) (Oral)   Ht 5\' 7"  (1.702 m)   Wt (!) 163.3 kg (360 lb)   SpO2 97%   BMI 56.38 kg/m   Physical Exam  Constitutional: She appears well-developed and well-nourished. No distress.  Afebrile, nontoxic-appearing, sitting comfortably in bed no acute distress.  HENT:  Head: Normocephalic and atraumatic.  Eyes: Conjunctivae are normal.  Neck: Neck supple.  Cardiovascular: Normal rate, regular rhythm, normal heart sounds and intact distal pulses.  No murmur heard. Pulmonary/Chest: Effort normal and breath sounds normal. No stridor. No respiratory distress. She has no wheezes. She has no rales.  Musculoskeletal: Normal range of motion. She exhibits tenderness. She exhibits no edema.  Patient has full range of motion of the knee and hip.  Tender to palpation of the patella and medial knee.  Mild edema.  Neurological: She is alert. No sensory deficit. She exhibits normal muscle tone.  5/5 strength in lower extremities bilaterally  Skin: Skin is warm and dry. Capillary refill takes less than 2 seconds. No rash noted. She is not diaphoretic. No erythema. No pallor.  Psychiatric: She has a normal mood and affect.  Nursing note and vitals reviewed.    ED Treatments / Results  Labs (all labs ordered are listed, but only abnormal results are displayed) Labs Reviewed  POC URINE PREG, ED    EKG None  Radiology Dg Knee Complete 4 Views Right  Result Date: 02/28/2018 CLINICAL DATA:  Initial evaluation for acute trauma, fall. EXAM: RIGHT KNEE - COMPLETE 4+ VIEW COMPARISON:  None. FINDINGS: No evidence of fracture,  dislocation, or joint effusion. No evidence of arthropathy or other focal bone abnormality. Soft tissues are unremarkable. IMPRESSION: No acute osseous abnormality about the right knee. Electronically Signed   By: Jeannine Boga M.D.   On: 02/28/2018 21:23   Dg Hip Unilat W Or Wo Pelvis 2-3 Views Right  Result Date: 02/28/2018 CLINICAL DATA:  Initial evaluation for acute pain status post fall. EXAM: DG HIP (WITH OR WITHOUT PELVIS) 2-3V RIGHT COMPARISON:  None. FINDINGS: No acute fracture or dislocation. Femoral heads in normal alignment within the acetabula. Femoral head heights maintained. Bony pelvis intact. Mild osteoarthritic changes about the hips bilaterally. No acute soft tissue abnormality.  IUD overlies the pelvic inlet. IMPRESSION: 1. No acute osseous abnormality about the right hip. 2. 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 Electronically Signed   By: Jeannine Boga M.D.   On: 02/28/2018 21:25    Procedures Procedures (including critical care time)  Medications Ordered in ED Medications  ibuprofen (ADVIL,MOTRIN) tablet 800 mg (800 mg Oral Given 02/28/18 2007)     Initial Impression / Assessment and Plan / ED Course  I have reviewed the triage vital signs and the nursing notes.  Pertinent labs & imaging results that were available during my care of the patient were reviewed by me and considered in my medical decision making (see chart for details).    Patient presents after mechanical slip and fall over 12 hours ago with right hip and right knee pain.  She is neurovascularly intact and has been ambulating all day.  Plain films are negative for acute abnormalities Patient was placed in a Ace wrap while in the emergency department, pain was managed.  Patient was ambulating without assistance to the bathroom. [place] protocol indicated and discussed with patient.  Discharge home with symptomatic relief and close follow-up with PCP and Ortho as  needed.  Discussed strict return precautions and advised to return to the emergency department if experiencing any new or worsening symptoms. Instructions were understood and patient agreed with discharge plan.  Final Clinical Impressions(s) / ED Diagnoses   Final diagnoses:  Fall, initial  encounter  Acute pain of right knee  Right hip pain    ED Discharge Orders        Ordered    ibuprofen (ADVIL,MOTRIN) 800 MG tablet  3 times daily     02/28/18 2158       Dossie Der 02/28/18 2204    Hayden Rasmussen, MD 03/02/18 1101

## 2018-02-28 NOTE — ED Triage Notes (Signed)
Pt states that she slipped in the bathroom this am,  C/o right hip, right knee pain that has continued through the day, denies any LOC, denies hitting her head.

## 2018-03-12 ENCOUNTER — Encounter (HOSPITAL_COMMUNITY): Payer: Self-pay | Admitting: Emergency Medicine

## 2018-03-12 ENCOUNTER — Other Ambulatory Visit: Payer: Self-pay

## 2018-03-12 ENCOUNTER — Emergency Department (HOSPITAL_COMMUNITY)
Admission: EM | Admit: 2018-03-12 | Discharge: 2018-03-12 | Disposition: A | Discharge status: Home / Self Care | Payer: Medicaid Other | Attending: Emergency Medicine | Admitting: Emergency Medicine

## 2018-03-12 DIAGNOSIS — M542 Cervicalgia: Secondary | ICD-10-CM | POA: Diagnosis present

## 2018-03-12 DIAGNOSIS — F17210 Nicotine dependence, cigarettes, uncomplicated: Secondary | ICD-10-CM | POA: Diagnosis not present

## 2018-03-12 DIAGNOSIS — E119 Type 2 diabetes mellitus without complications: Secondary | ICD-10-CM | POA: Insufficient documentation

## 2018-03-12 DIAGNOSIS — S51812A Laceration without foreign body of left forearm, initial encounter: Secondary | ICD-10-CM

## 2018-03-12 DIAGNOSIS — Y939 Activity, unspecified: Secondary | ICD-10-CM | POA: Diagnosis not present

## 2018-03-12 DIAGNOSIS — Z79899 Other long term (current) drug therapy: Secondary | ICD-10-CM | POA: Diagnosis not present

## 2018-03-12 DIAGNOSIS — Y999 Unspecified external cause status: Secondary | ICD-10-CM | POA: Diagnosis not present

## 2018-03-12 DIAGNOSIS — S51822A Laceration with foreign body of left forearm, initial encounter: Secondary | ICD-10-CM | POA: Diagnosis not present

## 2018-03-12 DIAGNOSIS — Y929 Unspecified place or not applicable: Secondary | ICD-10-CM | POA: Insufficient documentation

## 2018-03-12 LAB — BASIC METABOLIC PANEL
Anion gap: 11 (ref 5–15)
BUN: 7 mg/dL (ref 6–20)
CO2: 25 mmol/L (ref 22–32)
Calcium: 9 mg/dL (ref 8.9–10.3)
Chloride: 100 mmol/L — ABNORMAL LOW (ref 101–111)
Creatinine, Ser: 0.85 mg/dL (ref 0.44–1.00)
GFR calc Af Amer: >60 mL/min (ref >60)
GFR calc non Af Amer: >60 mL/min (ref >60)
Glucose, Bld: 283 mg/dL — ABNORMAL HIGH (ref 65–99)
Potassium: 3.3 mmol/L — ABNORMAL LOW (ref 3.5–5.1)
Sodium: 136 mmol/L (ref 135–145)

## 2018-03-12 LAB — CBC
HCT: 38 % (ref 36.0–46.0)
HEMOGLOBIN: 11.7 g/dL — AB (ref 12.0–15.0)
MCH: 23.4 pg — ABNORMAL LOW (ref 26.0–34.0)
MCHC: 30.8 g/dL (ref 30.0–36.0)
MCV: 76.2 fL — AB (ref 78.0–100.0)
PLATELETS: 301 10*3/uL (ref 150–400)
RBC: 4.99 MIL/uL (ref 3.87–5.11)
RDW: 17.3 % — ABNORMAL HIGH (ref 11.5–15.5)
WBC: 16.3 10*3/uL — AB (ref 4.0–10.5)

## 2018-03-12 MED ORDER — LIDOCAINE-EPINEPHRINE (PF) 2 %-1:200000 IJ SOLN
20.0000 mL | Freq: Once | INTRAMUSCULAR | Status: DC
  Filled 2018-03-12: qty 20

## 2018-03-12 MED ORDER — LIDOCAINE-EPINEPHRINE 2 %-1:100000 IJ SOLN: 20.0000 mL | Freq: Once | INTRAMUSCULAR | Status: DC
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
## 2018-08-27 ENCOUNTER — Ambulatory Visit: Payer: Medicaid Other | Admitting: Obstetrics & Gynecology

## 2018-08-27 ENCOUNTER — Encounter (INDEPENDENT_AMBULATORY_CARE_PROVIDER_SITE_OTHER): Payer: Self-pay

## 2018-08-27 ENCOUNTER — Encounter: Payer: Self-pay | Admitting: Obstetrics & Gynecology

## 2018-08-27 ENCOUNTER — Other Ambulatory Visit (HOSPITAL_COMMUNITY)
Admission: RE | Admit: 2018-08-27 | Discharge: 2018-08-27 | Disposition: A | Discharge status: Home / Self Care | Payer: Medicaid Other | Source: Ambulatory Visit | Attending: Obstetrics & Gynecology | Admitting: Obstetrics & Gynecology

## 2018-08-27 VITALS — BP 143/93 | HR 94 | Ht 67.2 in | Wt 354.2 lb

## 2018-08-27 DIAGNOSIS — Z01419 Encounter for gynecological examination (general) (routine) without abnormal findings: Secondary | ICD-10-CM

## 2018-08-27 DIAGNOSIS — Z124 Encounter for screening for malignant neoplasm of cervix: Secondary | ICD-10-CM | POA: Insufficient documentation

## 2018-08-27 DIAGNOSIS — Z0000 Encounter for general adult medical examination without abnormal findings: Secondary | ICD-10-CM | POA: Diagnosis not present

## 2018-08-27 MED ORDER — FLUCONAZOLE 150 MG PO TABS: 150.0000 mg | ORAL_TABLET | Freq: Once | ORAL | 0 refills | Status: AC

## 2018-08-27 NOTE — Progress Notes (Signed)
Subjective:     Judy Nelson is a 32 y.o. female here for a routine exam.  Patient's last menstrual period was 08/05/2018. G1P1001 Birth Control Method:  Mirena Menstrual Calendar(currently): regular monthly  Current complaints: none.   Current acute medical issues:     Recent Gynecologic History Patient's last menstrual period was 08/05/2018. Last Pap: 2016,  normal Last mammogram: n/a,    Past Medical History:  Diagnosis Date  . Anxiety   . Depression   . Heartburn 09/01/2015   no current med.  Marland Kitchen History of anemia 01/2014  . Non-insulin dependent type 2 diabetes mellitus ([place])   . Obesity   . PTSD (post-traumatic stress disorder)   . Scar of breast 08/2015   left  . Sinus headache     Past Surgical History:  Procedure Laterality Date  . BREAST SURGERY    . CRANIOTOMY N/A 06/30/2017   Procedure: BIFRONTAL CRANIOTOMY;  Surgeon: Consuella Lose, MD;  Location: [facility];  Service: Neurosurgery;  Laterality: N/A;  BIFRONTAL CRANIOTOMY Repair of Encephalocele  . INCISION AND DRAINAGE ABSCESS Left 10/07/2014   Procedure: INCISION AND DRAINAGE LEFT BREAST ABSCESS;  Surgeon: Jamesetta So, MD;  Location: [facility];  Service: General;  Laterality: Left;  . LAPAROSCOPIC OVARIAN CYSTECTOMY Right 02/05/2014   Procedure: LAPAROSCOPIC right retroperitoneal (NOT OVARIAN) CYSTECTOMY;  Surgeon: Florian Buff, MD;  Location: [facility];  Service: Gynecology;  Laterality: Right;  . MASS EXCISION Left 09/07/2015   Procedure: Minor SCAR EXCISION LEFT BREAST, PLASTIC CLOSURE;  Surgeon: Cristine Polio, MD;  Location: [facility];  Service: Plastics;  Laterality: Left;  . TYMPANOSTOMY TUBE PLACEMENT Bilateral 10/2014    OB History    Gravida  1   Para  1   Term  1   Preterm      AB      Living  1     SAB      TAB      Ectopic      Multiple      Live Births  1           Social History   Socioeconomic History  . Marital status: Single    Spouse name: Not on  file  . Number of children: Not on file  . Years of education: Not on file  . Highest education level: Not on file  Occupational History  . Not on file  Social Needs  . Financial resource strain: Not on file  . Food insecurity:    Worry: Not on file    Inability: Not on file  . Transportation needs:    Medical: Not on file    Non-medical: Not on file  Tobacco Use  . Smoking status: Current Every Day Smoker    Packs/day: 0.50    Years: 7.00    Pack years: 3.50    Types: Cigarettes  . Smokeless tobacco: Never Used  Substance and Sexual Activity  . Alcohol use: Yes    Comment: occasionally  . Drug use: No  . Sexual activity: Yes    Birth control/protection: IUD  Lifestyle  . Physical activity:    Days per week: Not on file    Minutes per session: Not on file  . Stress: Not on file  Relationships  . Social connections:    Talks on phone: Not on file    Gets together: Not on file    Attends religious service: Not on file    Active member of  club or organization: Not on file    Attends meetings of clubs or organizations: Not on file    Relationship status: Not on file  Other Topics Concern  . Not on file  Social History Narrative  . Not on file    Family History  Problem Relation Age of Onset  . Diabetes Father   . Hypertension Father   . Heart disease Daughter        heart murmur  . Cancer Maternal Grandmother        ovarian cancer  . Obesity Paternal Grandmother   . Diabetes Paternal Grandfather      Current Outpatient Medications:  .  ALPRAZolam (XANAX) 1 MG tablet, TAKE 1 TABLET BY MOUTH THREE TIMES DAILY, Disp: 90 tablet, Rfl: 5 .  sitaGLIPtin (JANUVIA) 50 MG tablet, Take 50 mg by mouth daily., Disp: , Rfl:  .  albuterol (PROVENTIL HFA;VENTOLIN HFA) 108 (90 Base) MCG/ACT inhaler, Inhale 2 puffs into the lungs every 6 (six) hours as needed for wheezing or shortness of breath., Disp: , Rfl:  .  clonazePAM (KLONOPIN) 1 MG tablet, Take 1 mg by mouth 3 (three)  times daily., Disp: , Rfl:  .  escitalopram (LEXAPRO) 10 MG tablet, Take 10 mg by mouth daily., Disp: , Rfl:  .  fluconazole (DIFLUCAN) 150 MG tablet, Take 1 tablet (150 mg total) by mouth once for 1 dose. Take the second tablet 3 days after the first one., Disp: 2 tablet, Rfl: 0 .  ibuprofen (ADVIL,MOTRIN) 800 MG tablet, Take 1 tablet (800 mg total) by mouth 3 (three) times daily. (Patient not taking: Reported on 08/27/2018), Disp: 21 tablet, Rfl: 0 .  levETIRAcetam (KEPPRA) 500 MG tablet, Take 1 tablet (500 mg total) by mouth 2 (two) times daily. (Patient not taking: Reported on 08/27/2018), Disp: 60 tablet, Rfl: 0 .  SUMAtriptan (IMITREX) 50 MG tablet, Take 50 mg by mouth every 2 (two) hours as needed for migraine. May repeat in 2 hours if headache persists or recurs., Disp: , Rfl:  .  traMADol (ULTRAM) 50 MG tablet, Take 2 tablets (100 mg total) by mouth every 6 (six) hours as needed. (Patient not taking: Reported on 06/27/2017), Disp: 32 tablet, Rfl: 0 .  venlafaxine XR (EFFEXOR-XR) 150 MG 24 hr capsule, TAKE 2 CAPSULES BY MOUTH EVERY DAY (Patient not taking: Reported on 06/27/2017), Disp: 60 capsule, Rfl: 3 .  venlafaxine XR (EFFEXOR-XR) 150 MG 24 hr capsule, TAKE 2 CAPSULES BY MOUTH EVERY DAY (Patient not taking: Reported on 06/27/2017), Disp: 60 capsule, Rfl: 5  Review of Systems  Review of Systems  Constitutional: Negative for fever, chills, weight loss, malaise/fatigue and diaphoresis.  HENT: Negative for hearing loss, ear pain, nosebleeds, congestion, sore throat, neck pain, tinnitus and ear discharge.   Eyes: Negative for blurred vision, double vision, photophobia, pain, discharge and redness.  Respiratory: Negative for cough, hemoptysis, sputum production, shortness of breath, wheezing and stridor.   Cardiovascular: Negative for chest pain, palpitations, orthopnea, claudication, leg swelling and PND.  Gastrointestinal: negative for abdominal pain. Negative for heartburn, nausea, vomiting,  diarrhea, constipation, blood in stool and melena.  Genitourinary: Negative for dysuria, urgency, frequency, hematuria and flank pain.  Musculoskeletal: Negative for myalgias, back pain, joint pain and falls.  Skin: Negative for itching and rash.  Neurological: Negative for dizziness, tingling, tremors, sensory change, speech change, focal weakness, seizures, loss of consciousness, weakness and headaches.  Endo/Heme/Allergies: Negative for environmental allergies and polydipsia. Does not bruise/bleed easily.  Psychiatric/Behavioral: Negative for  depression, suicidal ideas, hallucinations, memory loss and substance abuse. The patient is not nervous/anxious and does not have insomnia.        Objective:  Blood pressure (!) 143/93, pulse 94, height 5' 7.2" (1.707 m), weight (!) 354 lb 3.2 oz (160.7 kg), last menstrual period 08/05/2018, unknown if currently breastfeeding.   Physical Exam  Vitals reviewed. Constitutional: She is oriented to person, place, and time. She appears well-developed and well-nourished.  HENT:  Head: Normocephalic and atraumatic.        Right Ear: External ear normal.  Left Ear: External ear normal.  Nose: Nose normal.  Mouth/Throat: Oropharynx is clear and moist.  Eyes: Conjunctivae and EOM are normal. Pupils are equal, round, and reactive to light. Right eye exhibits no discharge. Left eye exhibits no discharge. No scleral icterus.  Neck: Normal range of motion. Neck supple. No tracheal deviation present. No thyromegaly present.  Cardiovascular: Normal rate, regular rhythm, normal heart sounds and intact distal pulses.  Exam reveals no gallop and no friction rub.   No murmur heard. Respiratory: Effort normal and breath sounds normal. No respiratory distress. She has no wheezes. She has no rales. She exhibits no tenderness.  GI: Soft. Bowel sounds are normal. She exhibits no distension and no mass. There is no tenderness. There is no rebound and no guarding.   Genitourinary:  Breasts no masses skin changes or nipple changes bilaterally      Vulva is normal without lesions Vagina is pink moist without discharge Cervix normal in appearance and pap is done Uterus is normal size shape and contour Adnexa is negative with normal sized ovaries   Musculoskeletal: Normal range of motion. She exhibits no edema and no tenderness.  Neurological: She is alert and oriented to person, place, and time. She has normal reflexes. She displays normal reflexes. No cranial nerve deficit. She exhibits normal muscle tone. Coordination normal.  Skin: Skin is warm and dry. No rash noted. No erythema. No pallor.  Psychiatric: She has a normal mood and affect. Her behavior is normal. Judgment and thought content normal.       Medications Ordered at today's visit: Meds ordered this encounter  Medications  . fluconazole (DIFLUCAN) 150 MG tablet    Sig: Take 1 tablet (150 mg total) by mouth once for 1 dose. Take the second tablet 3 days after the first one.    Dispense:  2 tablet    Refill:  0    Other orders placed at today's visit: No orders of the defined types were placed in this encounter.     Assessment:    Healthy female exam.    Plan:    Contraception: IUD. Follow up in: 4 months.    IUD placement   Return in about 2 years (around 08/27/2020) for yearly, with Dr Elonda Husky.

## 2018-08-29 LAB — CYTOLOGY - PAP
Diagnosis: NEGATIVE
HPV: NOT DETECTED

## 2018-10-04 ENCOUNTER — Ambulatory Visit: Payer: Medicaid Other | Admitting: Obstetrics & Gynecology

## 2018-10-04 ENCOUNTER — Encounter: Payer: Self-pay | Admitting: Obstetrics & Gynecology

## 2018-10-04 ENCOUNTER — Encounter (INDEPENDENT_AMBULATORY_CARE_PROVIDER_SITE_OTHER): Payer: Self-pay

## 2018-10-04 ENCOUNTER — Other Ambulatory Visit: Payer: Self-pay

## 2018-10-04 VITALS — BP 128/86 | HR 90 | Ht 67.5 in | Wt 353.0 lb

## 2018-10-04 DIAGNOSIS — N921 Excessive and frequent menstruation with irregular cycle: Secondary | ICD-10-CM

## 2018-10-04 MED ORDER — PROGESTERONE MICRONIZED 200 MG PO CAPS: 200.0000 mg | ORAL_CAPSULE | Freq: Every day | ORAL | 11 refills | Status: DC

## 2018-10-04 MED ORDER — NAPROXEN SODIUM 550 MG PO TABS: 550.0000 mg | ORAL_TABLET | Freq: Two times a day (BID) | ORAL | 2 refills | Status: DC

## 2018-10-04 NOTE — Progress Notes (Signed)
Chief Complaint  Patient presents with  . Metrorrhagia      32 y.o. G1P1001 No LMP recorded. (Menstrual status: IUD). The current method of family planning is IUD.  Outpatient Encounter Medications as of 10/04/2018  Medication Sig  . albuterol (PROVENTIL HFA;VENTOLIN HFA) 108 (90 Base) MCG/ACT inhaler Inhale 2 puffs into the lungs every 6 (six) hours as needed for wheezing or shortness of breath.  . ALPRAZolam (XANAX) 1 MG tablet TAKE 1 TABLET BY MOUTH THREE TIMES DAILY  . escitalopram (LEXAPRO) 10 MG tablet Take 10 mg by mouth daily.  Marland Kitchen ibuprofen (ADVIL,MOTRIN) 800 MG tablet Take 1 tablet (800 mg total) by mouth 3 (three) times daily.  . sitaGLIPtin (JANUVIA) 50 MG tablet Take 50 mg by mouth daily.  . SUMAtriptan (IMITREX) 50 MG tablet Take 50 mg by mouth every 2 (two) hours as needed for migraine. May repeat in 2 hours if headache persists or recurs.  . [DISCONTINUED] clonazePAM (KLONOPIN) 1 MG tablet Take 1 mg by mouth 3 (three) times daily.  . naproxen sodium (ANAPROX DS) 550 MG tablet Take 1 tablet (550 mg total) by mouth 2 (two) times daily with a meal.  . progesterone (PROMETRIUM) 200 MG capsule Take 1 capsule (200 mg total) by mouth daily.  . [DISCONTINUED] levETIRAcetam (KEPPRA) 500 MG tablet Take 1 tablet (500 mg total) by mouth 2 (two) times daily. (Patient not taking: Reported on 08/27/2018)  . [DISCONTINUED] traMADol (ULTRAM) 50 MG tablet Take 2 tablets (100 mg total) by mouth every 6 (six) hours as needed. (Patient not taking: Reported on 06/27/2017)  . [DISCONTINUED] venlafaxine XR (EFFEXOR-XR) 150 MG 24 hr capsule TAKE 2 CAPSULES BY MOUTH EVERY DAY (Patient not taking: Reported on 06/27/2017)  . [DISCONTINUED] venlafaxine XR (EFFEXOR-XR) 150 MG 24 hr capsule TAKE 2 CAPSULES BY MOUTH EVERY DAY (Patient not taking: Reported on 06/27/2017)   No facility-administered encounter medications on file as of 10/04/2018.     Subjective Pt has been bleeding episodically for 1  month pink red brown Not heavy but present Also with cramping Past Medical History:  Diagnosis Date  . Anxiety   . Depression   . Heartburn 09/01/2015   no current med.  Marland Kitchen History of anemia 01/2014  . Non-insulin dependent type 2 diabetes mellitus ([place])   . Obesity   . PTSD (post-traumatic stress disorder)   . Scar of breast 08/2015   left  . Sinus headache     Past Surgical History:  Procedure Laterality Date  . BREAST SURGERY    . CRANIOTOMY N/A 06/30/2017   Procedure: BIFRONTAL CRANIOTOMY;  Surgeon: Consuella Lose, MD;  Location: [facility];  Service: Neurosurgery;  Laterality: N/A;  BIFRONTAL CRANIOTOMY Repair of Encephalocele  . INCISION AND DRAINAGE ABSCESS Left 10/07/2014   Procedure: INCISION AND DRAINAGE LEFT BREAST ABSCESS;  Surgeon: Jamesetta So, MD;  Location: [facility];  Service: General;  Laterality: Left;  . LAPAROSCOPIC OVARIAN CYSTECTOMY Right 02/05/2014   Procedure: LAPAROSCOPIC right retroperitoneal (NOT OVARIAN) CYSTECTOMY;  Surgeon: Florian Buff, MD;  Location: [facility];  Service: Gynecology;  Laterality: Right;  . MASS EXCISION Left 09/07/2015   Procedure: Minor SCAR EXCISION LEFT BREAST, PLASTIC CLOSURE;  Surgeon: Cristine Polio, MD;  Location: [facility];  Service: Plastics;  Laterality: Left;  . TYMPANOSTOMY TUBE PLACEMENT Bilateral 10/2014    OB History    Gravida  1   Para  1   Term  1   Preterm  AB      Living  1     SAB      TAB      Ectopic      Multiple      Live Births  1           No Known Allergies  Social History   Socioeconomic History  . Marital status: Single    Spouse name: Not on file  . Number of children: Not on file  . Years of education: Not on file  . Highest education level: Not on file  Occupational History  . Not on file  Social Needs  . Financial resource strain: Not on file  . Food insecurity:    Worry: Not on file    Inability: Not on file  . Transportation needs:     Medical: Not on file    Non-medical: Not on file  Tobacco Use  . Smoking status: Current Every Day Smoker    Packs/day: 0.50    Years: 7.00    Pack years: 3.50    Types: Cigarettes  . Smokeless tobacco: Never Used  Substance and Sexual Activity  . Alcohol use: Yes    Comment: occasionally  . Drug use: No  . Sexual activity: Yes    Birth control/protection: I.U.D.  Lifestyle  . Physical activity:    Days per week: Not on file    Minutes per session: Not on file  . Stress: Not on file  Relationships  . Social connections:    Talks on phone: Not on file    Gets together: Not on file    Attends religious service: Not on file    Active member of club or organization: Not on file    Attends meetings of clubs or organizations: Not on file    Relationship status: Not on file  Other Topics Concern  . Not on file  Social History Narrative  . Not on file    Family History  Problem Relation Age of Onset  . Diabetes Father   . Hypertension Father   . Heart disease Daughter        heart murmur  . Cancer Maternal Grandmother        ovarian cancer  . Obesity Paternal Grandmother   . Diabetes Paternal Grandfather     Medications:       Current Outpatient Medications:  .  albuterol (PROVENTIL HFA;VENTOLIN HFA) 108 (90 Base) MCG/ACT inhaler, Inhale 2 puffs into the lungs every 6 (six) hours as needed for wheezing or shortness of breath., Disp: , Rfl:  .  ALPRAZolam (XANAX) 1 MG tablet, TAKE 1 TABLET BY MOUTH THREE TIMES DAILY, Disp: 90 tablet, Rfl: 5 .  escitalopram (LEXAPRO) 10 MG tablet, Take 10 mg by mouth daily., Disp: , Rfl:  .  ibuprofen (ADVIL,MOTRIN) 800 MG tablet, Take 1 tablet (800 mg total) by mouth 3 (three) times daily., Disp: 21 tablet, Rfl: 0 .  sitaGLIPtin (JANUVIA) 50 MG tablet, Take 50 mg by mouth daily., Disp: , Rfl:  .  SUMAtriptan (IMITREX) 50 MG tablet, Take 50 mg by mouth every 2 (two) hours as needed for migraine. May repeat in 2 hours if headache persists or  recurs., Disp: , Rfl:  .  naproxen sodium (ANAPROX DS) 550 MG tablet, Take 1 tablet (550 mg total) by mouth 2 (two) times daily with a meal., Disp: 60 tablet, Rfl: 2 .  progesterone (PROMETRIUM) 200 MG capsule, Take 1 capsule (200 mg total) by mouth  daily., Disp: 30 capsule, Rfl: 11  Objective Blood pressure 128/86, pulse 90, height 5' 7.5" (1.715 m), weight (!) 353 lb (160.1 kg), unknown if currently breastfeeding.  GEM WDWN NAD  Pertinent ROS No burning with urination, frequency or urgency No nausea, vomiting or diarrhea Nor fever chills or other constitutional symptoms   Labs or studies     Impression Diagnoses this Encounter::   ICD-10-CM   1. Menometrorrhagia N92.1     Established relevant diagnosis(es):   Plan/Recommendations: Meds ordered this encounter  Medications  . progesterone (PROMETRIUM) 200 MG capsule    Sig: Take 1 capsule (200 mg total) by mouth daily.    Dispense:  30 capsule    Refill:  11  . naproxen sodium (ANAPROX DS) 550 MG tablet    Sig: Take 1 tablet (550 mg total) by mouth 2 (two) times daily with a meal.    Dispense:  60 tablet    Refill:  2    Labs or Scans Ordered: No orders of the defined types were placed in this encounter.   Management:: >bleeding most likely due to Mirena close to removal date in a ptaitn with BMI 54  Follow up Return for keep scheduled.        Face to face time:  10 minutes  Greater than 50% of the visit time was spent in counseling and coordination of care with the patient.  The summary and outline of the counseling and care coordination is summarized in the note above.   All questions were answered.

## 2018-11-09 ENCOUNTER — Telehealth: Payer: Self-pay | Admitting: *Deleted

## 2018-11-09 ENCOUNTER — Encounter: Payer: Self-pay | Admitting: Adult Health

## 2018-11-09 ENCOUNTER — Ambulatory Visit: Payer: Medicaid Other | Admitting: Adult Health

## 2018-11-09 VITALS — BP 148/94 | HR 114 | Ht 67.5 in | Wt 353.0 lb

## 2018-11-09 DIAGNOSIS — Z30432 Encounter for removal of intrauterine contraceptive device: Secondary | ICD-10-CM

## 2018-11-09 DIAGNOSIS — R102 Pelvic and perineal pain: Secondary | ICD-10-CM

## 2018-11-09 DIAGNOSIS — O3680X0 Pregnancy with inconclusive fetal viability, not applicable or unspecified: Secondary | ICD-10-CM

## 2018-11-09 DIAGNOSIS — Z3201 Encounter for pregnancy test, result positive: Secondary | ICD-10-CM

## 2018-11-09 LAB — POCT URINALYSIS DIPSTICK: Protein, UA: POSITIVE — AB

## 2018-11-09 LAB — POCT URINE PREGNANCY: PREG TEST UR: POSITIVE — AB

## 2018-11-09 NOTE — Patient Instructions (Signed)
First Trimester of Pregnancy  The first trimester of pregnancy is from week 1 until the end of week 13 (months 1 through 3). A week after a sperm fertilizes an egg, the egg will implant on the wall of the uterus. This embryo will begin to develop into a baby. Genes from you and your partner will form the baby. The female genes will determine whether the baby will be a boy or a girl. At 6-8 weeks, the eyes and face will be formed, and the heartbeat can be seen on ultrasound. At the end of 12 weeks, all the baby's organs will be formed.  Now that you are pregnant, you will want to do everything you can to have a healthy baby. Two of the most important things are to get good prenatal care and to follow your health care provider's instructions. Prenatal care is all the medical care you receive before the baby's birth. This care will help prevent, find, and treat any problems during the pregnancy and childbirth.  Body changes during your first trimester  Your body goes through many changes during pregnancy. The changes vary from woman to woman.   You may gain or lose a couple of pounds at first.   You may feel sick to your stomach (nauseous) and you may throw up (vomit). If the vomiting is uncontrollable, call your health care provider.   You may tire easily.   You may develop headaches that can be relieved by medicines. All medicines should be approved by your health care provider.   You may urinate more often. Painful urination may mean you have a bladder infection.   You may develop heartburn as a result of your pregnancy.   You may develop constipation because certain hormones are causing the muscles that push stool through your intestines to slow down.   You may develop hemorrhoids or swollen veins (varicose veins).   Your breasts may begin to grow larger and become tender. Your nipples may stick out more, and the tissue that surrounds them ([place]) may become darker.   Your gums may bleed and may be  sensitive to brushing and flossing.   Dark spots or blotches (chloasma, mask of pregnancy) may develop on your face. This will likely fade after the baby is born.   Your menstrual periods will stop.   You may have a loss of appetite.   You may develop cravings for certain kinds of food.   You may have changes in your emotions from day to day, such as being excited to be pregnant or being concerned that something may go wrong with the pregnancy and baby.   You may have more vivid and strange dreams.   You may have changes in your hair. These can include thickening of your hair, rapid growth, and changes in texture. Some women also have hair loss during or after pregnancy, or hair that feels dry or thin. Your hair will most likely return to normal after your baby is born.  What to expect at prenatal visits  During a routine prenatal visit:   You will be weighed to make sure you and the baby are growing normally.   Your blood pressure will be taken.   Your abdomen will be measured to track your baby's growth.   The fetal heartbeat will be listened to between weeks 10 and 14 of your pregnancy.   Test results from any previous visits will be discussed.  Your health care provider may ask you:     How you are feeling.   If you are feeling the baby move.   If you have had any abnormal symptoms, such as leaking fluid, bleeding, severe headaches, or abdominal cramping.   If you are using any tobacco products, including cigarettes, chewing tobacco, and electronic cigarettes.   If you have any questions.  Other tests that may be performed during your first trimester include:   Blood tests to find your blood type and to check for the presence of any previous infections. The tests will also be used to check for low iron levels (anemia) and protein on red blood cells (Rh antibodies). Depending on your risk factors, or if you previously had diabetes during pregnancy, you may have tests to check for high blood sugar  that affects pregnant women (gestational diabetes).   Urine tests to check for infections, diabetes, or protein in the urine.   An ultrasound to confirm the proper growth and development of the baby.   Fetal screens for spinal cord problems (spina bifida) and Down syndrome.   HIV (human immunodeficiency virus) testing. Routine prenatal testing includes screening for HIV, unless you choose not to have this test.   You may need other tests to make sure you and the baby are doing well.  Follow these instructions at home:  Medicines   Follow your health care provider's instructions regarding medicine use. Specific medicines may be either safe or unsafe to take during pregnancy.   Take a prenatal vitamin that contains at least 600 micrograms (mcg) of folic acid.   If you develop constipation, try taking a stool softener if your health care provider approves.  Eating and drinking     Eat a balanced diet that includes fresh fruits and vegetables, whole grains, good sources of protein such as meat, eggs, or tofu, and low-fat dairy. Your health care provider will help you determine the amount of weight gain that is right for you.   Avoid raw meat and uncooked cheese. These carry germs that can cause birth defects in the baby.   Eating four or five small meals rather than three large meals a day may help relieve nausea and vomiting. If you start to feel nauseous, eating a few soda crackers can be helpful. Drinking liquids between meals, instead of during meals, also seems to help ease nausea and vomiting.   Limit foods that are high in fat and processed sugars, such as fried and sweet foods.   To prevent constipation:  ? Eat foods that are high in fiber, such as fresh fruits and vegetables, whole grains, and beans.  ? Drink enough fluid to keep your urine clear or pale yellow.  Activity   Exercise only as directed by your health care provider. Most women can continue their usual exercise routine during  pregnancy. Try to exercise for 30 minutes at least 5 days a week. Exercising will help you:  ? Control your weight.  ? Stay in shape.  ? Be prepared for labor and delivery.   Experiencing pain or cramping in the lower abdomen or lower back is a good sign that you should stop exercising. Check with your health care provider before continuing with normal exercises.   Try to avoid standing for long periods of time. Move your legs often if you must stand in one place for a long time.   Avoid heavy lifting.   Wear low-heeled shoes and practice good posture.   You may continue to have sex unless your health care   provider tells you not to.  Relieving pain and discomfort   Wear a good support bra to relieve breast tenderness.   Take warm sitz baths to soothe any pain or discomfort caused by hemorrhoids. Use hemorrhoid cream if your health care provider approves.   Rest with your legs elevated if you have leg cramps or low back pain.   If you develop varicose veins in your legs, wear support hose. Elevate your feet for 15 minutes, 3-4 times a day. Limit salt in your diet.  Prenatal care   Schedule your prenatal visits by the twelfth week of pregnancy. They are usually scheduled monthly at first, then more often in the last 2 months before delivery.   Write down your questions. Take them to your prenatal visits.   Keep all your prenatal visits as told by your health care provider. This is important.  Safety   Wear your seat belt at all times when driving.   Make a list of emergency phone numbers, including numbers for family, friends, the hospital, and police and fire departments.  General instructions   Ask your health care provider for a referral to a local prenatal education class. Begin classes no later than the beginning of month 6 of your pregnancy.   Ask for help if you have counseling or nutritional needs during pregnancy. Your health care provider can offer advice or refer you to specialists for help  with various needs.   Do not use hot tubs, steam rooms, or saunas.   Do not douche or use tampons or scented sanitary pads.   Do not cross your legs for long periods of time.   Avoid cat litter boxes and soil used by cats. These carry germs that can cause birth defects in the baby and possibly loss of the fetus by miscarriage or stillbirth.   Avoid all smoking, herbs, alcohol, and medicines not prescribed by your health care provider. Chemicals in these products affect the formation and growth of the baby.   Do not use any products that contain nicotine or tobacco, such as cigarettes and e-cigarettes. If you need help quitting, ask your health care provider. You may receive counseling support and other resources to help you quit.   Schedule a dentist appointment. At home, brush your teeth with a soft toothbrush and be gentle when you floss.  Contact a health care provider if:   You have dizziness.   You have mild pelvic cramps, pelvic pressure, or nagging pain in the abdominal area.   You have persistent nausea, vomiting, or diarrhea.   You have a bad smelling vaginal discharge.   You have pain when you urinate.   You notice increased swelling in your face, hands, legs, or ankles.   You are exposed to fifth disease or chickenpox.   You are exposed to German measles (rubella) and have never had it.  Get help right away if:   You have a fever.   You are leaking fluid from your vagina.   You have spotting or bleeding from your vagina.   You have severe abdominal cramping or pain.   You have rapid weight gain or loss.   You vomit blood or material that looks like coffee grounds.   You develop a severe headache.   You have shortness of breath.   You have any kind of trauma, such as from a fall or a car accident.  Summary   The first trimester of pregnancy is from week 1 until   the end of week 13 (months 1 through 3).   Your body goes through many changes during pregnancy. The changes vary from  woman to woman.   You will have routine prenatal visits. During those visits, your health care provider will examine you, discuss any test results you may have, and talk with you about how you are feeling.  This information is not intended to replace advice given to you by your health care provider. Make sure you discuss any questions you have with your health care provider.  Document Released: 09/27/2001 Document Revised: 09/14/2016 Document Reviewed: 09/14/2016  Elsevier Interactive Patient Education  2019 [employer]

## 2018-11-09 NOTE — Telephone Encounter (Signed)
Patient called stating that she feels like her iud is out of place. It has been poking her and she feels like she can hardly walk now the pain is so severe. Patient wants a sooner appointment and just to have it taken it out. She doesn't care if we replace it or not. Pt to come now per jennifer.

## 2018-11-09 NOTE — Progress Notes (Addendum)
Patient ID: Judy Nelson, female   DOB: [birth year], 33 y.o.   MRN: [id number] History of Present Illness: Judy Nelson is a 33 year old black female worked in, for pain and feels like IUD is poking her, hurts to sit. PCP is [facility].   Current Medications, Allergies, Past Medical History, Past Surgical History, Family History and Social History were reviewed in [employer] record.     Review of Systems:  Has pelvic pain and feels like IUD poking her, hurts to sit, for about 2 days Has RLQ pain for about 2-3 days, has history of ovary/tube abscess    Physical Exam:BP (!) 148/94 (BP Location: Right Arm, Patient Position: Standing, Cuff Size: Large)   Pulse (!) 114   Ht 5' 7.5" (1.715 m)   Wt (!) 353 lb (160.1 kg)   BMI 54.47 kg/m  +bleeding since pap smear in November, took prometrium.  Urine dipstick 1+ protein, UPT + Consent signed for IUD removal  General:  Well developed, well nourished, no acute distress Skin:  Warm and dry Pelvic:  External genitalia is normal in appearance, no lesions.  The vagina is normal in appearance. Urethra has no lesions or masses. The cervix is bulbous. Pt coughed, IUD strings grasped and IUD easily removed. Uterus is felt to be normal size, shape, and contour.  No adnexal masses, RLQ tenderness noted, no rebound noted..Bladder is non tender, no masses felt.Discussed with Dr Elonda Husky.  Psych:  No mood changes, alert and cooperative,seems happy PHQ 2 score 0. Examination chaperoned by Estill Bamberg Rash LPN.  Will check [facility] today and get [place] scheduled.   Impression: 1. Pelvic pain   2. Encounter for IUD removal   3. Pregnancy test positive   4. Encounter to determine fetal viability of pregnancy, single or unspecified fetus       Plan: Check QHQCG Return in 2 weeks for dating [place] Review handout on First trimester and by [facility]  If increase in pain or bleeding go to ER

## 2018-11-09 NOTE — Addendum Note (Signed)
Addended by: Derrek Monaco A on: 11/09/2018 01:33 PM   Modules accepted: Orders

## 2018-11-10 LAB — BETA HCG QUANT (REF LAB): hCG Quant: 32 m[IU]/mL

## 2018-11-12 ENCOUNTER — Telehealth: Payer: Self-pay | Admitting: *Deleted

## 2018-11-12 ENCOUNTER — Telehealth: Payer: Self-pay | Admitting: Adult Health

## 2018-11-12 DIAGNOSIS — Z3201 Encounter for pregnancy test, result positive: Secondary | ICD-10-CM

## 2018-11-12 NOTE — Telephone Encounter (Signed)
Pt aware that [facility] 32, recheck in am,

## 2018-11-12 NOTE — Telephone Encounter (Signed)
Called patient back and discussed her concerns with her. She is worried that she needs an ultrasound. Advised patient that her hcg level is low at this point and we need to see if it is rising or falling. From there providers can make a plan for what to do next. Advised if she has severe worsening pain to go to ER. Patient agreeable to plan and will come for labs tomorrow.

## 2018-11-12 NOTE — Telephone Encounter (Signed)
For test results/ hurting

## 2018-11-14 ENCOUNTER — Telehealth: Payer: Self-pay | Admitting: Adult Health

## 2018-11-14 DIAGNOSIS — R102 Pelvic and perineal pain: Secondary | ICD-10-CM

## 2018-11-14 DIAGNOSIS — Z3201 Encounter for pregnancy test, result positive: Secondary | ICD-10-CM

## 2018-11-14 LAB — BETA HCG QUANT (REF LAB): hCG Quant: 20 m[IU]/mL

## 2018-11-14 NOTE — Telephone Encounter (Signed)
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

## 2018-11-22 ENCOUNTER — Ambulatory Visit: Payer: Medicaid Other | Admitting: Women's Health

## 2018-11-23 ENCOUNTER — Ambulatory Visit (INDEPENDENT_AMBULATORY_CARE_PROVIDER_SITE_OTHER): Payer: Medicaid Other

## 2018-11-23 DIAGNOSIS — O3680X0 Pregnancy with inconclusive fetal viability, not applicable or unspecified: Secondary | ICD-10-CM

## 2018-11-23 DIAGNOSIS — R102 Pelvic and perineal pain: Secondary | ICD-10-CM | POA: Diagnosis not present

## 2018-11-23 NOTE — Progress Notes (Signed)
PELVIC US TA/TV:homogeneous anteverted uterus,wnl,EEC 2.3 mm,4.8 x 2.5 x 4 cm complex right adnexal mass w/color flow,complex tube like structure adjacent to right adnexal mass ?? Pyosalpinx,3.5 x 3.3 x 3.5 cm hemorrhagic left ovarian cyst,ovaries appear mobile,right adnexal pain during ultrasound

## 2018-11-24 LAB — BETA HCG QUANT (REF LAB): hCG Quant: 11 m[IU]/mL

## 2018-11-26 ENCOUNTER — Telehealth: Payer: Self-pay | Admitting: Adult Health

## 2018-11-26 NOTE — Telephone Encounter (Signed)
Spoke with pt giving her the quant results. Advised to recheck on 2/17. Pt had questions about her [place]. Can you call pt regarding [place] results? Thanks!! Stuart

## 2018-11-26 NOTE — Telephone Encounter (Signed)
Had US done here in office on Friday was waiting on a call from [place] with the results also had labs done

## 2018-11-27 NOTE — Telephone Encounter (Signed)
Returned call and answered all questions

## 2018-12-03 ENCOUNTER — Other Ambulatory Visit: Payer: Medicaid Other

## 2018-12-03 ENCOUNTER — Telehealth: Payer: Self-pay | Admitting: *Deleted

## 2018-12-03 DIAGNOSIS — O039 Complete or unspecified spontaneous abortion without complication: Secondary | ICD-10-CM

## 2018-12-03 NOTE — Telephone Encounter (Signed)
Patient states she wants to quit smoking and is requesting nicotine patches.  Wanted to see if they could be prescribed and if not she will call her PCP. Please advise.

## 2018-12-04 ENCOUNTER — Telehealth: Payer: Self-pay | Admitting: *Deleted

## 2018-12-04 LAB — BETA HCG QUANT (REF LAB): hCG Quant: 11 m[IU]/mL

## 2018-12-04 NOTE — Telephone Encounter (Signed)
HCG still 11. Patient asking what is next?

## 2018-12-04 NOTE — Telephone Encounter (Signed)
LMOVM that per Dr Elonda Husky "repeat HCG 1 more time in 1-2 weeks, probably overkill but just to make sure, then repeat sonogram in 6-12 weeks to check status of the small cysts and fluid in the tube to make sure is stable and or possibly resolved in part, fluid in the tube will probably stay". If her HCG is still that low next week OK to put in her mirena IUD if she desires.  Advised to call back to get scheduled or if she has further questions.

## 2018-12-04 NOTE — Telephone Encounter (Signed)
Judy Nelson was going to call her but it is very low so liklihood of chronic ectopic is very low, some people have a low positive HCG  Repeat HCG 1 more time in 1-2 weeks, probably overkill but just to make sure, then repeat sonogram in 6-12 weeks to check status of the small cysts and fluid in the tube to make sure is stable and or possibly resolved in part, fluid in the tube will probably stay  If her HCG is still that low next week OK to put in her mirena IUD if she desires

## 2018-12-31 ENCOUNTER — Ambulatory Visit: Payer: Medicaid Other | Admitting: Women's Health

## 2018-12-31 ENCOUNTER — Other Ambulatory Visit: Payer: Self-pay | Admitting: Obstetrics & Gynecology

## 2019-05-08 ENCOUNTER — Encounter (HOSPITAL_COMMUNITY): Payer: Self-pay | Admitting: Emergency Medicine

## 2019-05-08 ENCOUNTER — Emergency Department (HOSPITAL_COMMUNITY)
Admission: EM | Admit: 2019-05-08 | Discharge: 2019-05-08 | Disposition: A | Discharge status: Home / Self Care | Payer: Medicaid Other | Attending: Emergency Medicine | Admitting: Emergency Medicine

## 2019-05-08 ENCOUNTER — Other Ambulatory Visit: Payer: Self-pay

## 2019-05-08 DIAGNOSIS — Z79899 Other long term (current) drug therapy: Secondary | ICD-10-CM | POA: Diagnosis not present

## 2019-05-08 DIAGNOSIS — F17210 Nicotine dependence, cigarettes, uncomplicated: Secondary | ICD-10-CM | POA: Diagnosis not present

## 2019-05-08 DIAGNOSIS — E119 Type 2 diabetes mellitus without complications: Secondary | ICD-10-CM | POA: Insufficient documentation

## 2019-05-08 DIAGNOSIS — R2231 Localized swelling, mass and lump, right upper limb: Secondary | ICD-10-CM | POA: Diagnosis present

## 2019-05-08 DIAGNOSIS — L02411 Cutaneous abscess of right axilla: Secondary | ICD-10-CM | POA: Insufficient documentation

## 2019-05-08 DIAGNOSIS — Z7984 Long term (current) use of oral hypoglycemic drugs: Secondary | ICD-10-CM | POA: Insufficient documentation

## 2019-05-08 MED ORDER — DOCUSATE SODIUM 250 MG PO CAPS: 250.0000 mg | ORAL_CAPSULE | Freq: Every day | ORAL | 0 refills | Status: DC

## 2019-05-08 MED ORDER — LIDOCAINE-EPINEPHRINE (PF) 2 %-1:200000 IJ SOLN
10.0000 mL | Freq: Once | INTRAMUSCULAR | Status: AC
  Administered 2019-05-08: 10 mL
  Filled 2019-05-08: qty 10

## 2019-05-08 MED ORDER — PROBIOTIC 250 MG PO CAPS: 1.0000 | ORAL_CAPSULE | Freq: Every day | ORAL | 0 refills | Status: DC

## 2019-05-08 MED ORDER — CLINDAMYCIN HCL 150 MG PO CAPS: 450.0000 mg | ORAL_CAPSULE | Freq: Three times a day (TID) | ORAL | 0 refills | Status: AC

## 2019-05-08 NOTE — ED Triage Notes (Signed)
Patient reports history of multiple breast surgeries, cystic nodes, and abscesses. Has had a bilat partial mastectomy. Patient states about 3 weeks ago she started having some discomfort in her R axilla. Has developed a large area of redness and tenderness. She states the pain has now spread into her R breast, which she normally has no feeling in.

## 2019-05-08 NOTE — Discharge Instructions (Addendum)
Take clindamycin until completed.  Take probiotic or eat a yogurt daily.  Keep dressing applied until later this evening.  You can shower as normally, but careful not to pull out the packing.  Redress daily.  Please return or see your doctor in 2 days for wound check and packing removal.  Please return sooner if you develop increasing pain, redness, swelling, fevers over 100.4.  You can continue taking your home Percocet and ibuprofen as needed for pain.  Take Colace once daily for stool softener.

## 2019-05-08 NOTE — ED Provider Notes (Signed)
[facility] EMERGENCY DEPARTMENT Provider Note   CSN: [id number] Arrival date & time: 05/08/19  1012     History   Chief Complaint Chief Complaint  Patient presents with  . Abscess    HPI Judy Nelson is a 33 y.o. female with history of obesity, diabetes, recurrent cyst and abscess who presents with 3-week history of worsening pain and swelling in her right axilla.  Patient denies any other symptoms including fever, chest pain, shortness of breath, abdominal pain, nausea, vomiting.  She has been taking Percocet and ibuprofen at home without relief.  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     HPI  Past Medical History:  Diagnosis Date  . Anxiety   . Depression   . Heartburn 09/01/2015   no current med.  Marland Kitchen History of anemia 01/2014  . Non-insulin dependent type 2 diabetes mellitus ([place])   . Obesity   . PTSD (post-traumatic stress disorder)   . Scar of breast 08/2015   left  . Sinus headache     Patient Active Problem List   Diagnosis Date Noted  . Pregnancy test positive 11/09/2018  . Encounter for IUD removal 11/09/2018  . Pelvic pain 11/09/2018  . Encounter to determine fetal viability of pregnancy 11/09/2018  . 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 Leukocytosis   . Neck pain   . PTSD (post-traumatic stress disorder)   . Anxiety   . Left breast abscess 10/05/2014  . Type 2 diabetes mellitus without complication ([place]) 55/97/4163  . Hypokalemia 10/05/2014  . Abscess of breast 10/05/2014  . Depression 06/19/2014  . Anemia 02/23/2014  . Postpartum depression 12/23/2013  . Preeclampsia 11/01/2013  . Pelvic mass 04/15/2013  . Morbid obesity with BMI of 50.0-59.9, adult ([place]) 04/15/2013    Past Surgical History:  Procedure Laterality Date  . BREAST SURGERY    . CRANIOTOMY N/A 06/30/2017   Procedure: BIFRONTAL CRANIOTOMY;  Surgeon: Consuella Lose, MD;  Location: [place];  Service: Neurosurgery;  Laterality: N/A;  BIFRONTAL CRANIOTOMY  Repair of Encephalocele  . INCISION AND DRAINAGE ABSCESS Left 10/07/2014   Procedure: INCISION AND DRAINAGE LEFT BREAST ABSCESS;  Surgeon: Jamesetta So, MD;  Location: [facility];  Service: General;  Laterality: Left;  . LAPAROSCOPIC OVARIAN CYSTECTOMY Right 02/05/2014   Procedure: LAPAROSCOPIC right retroperitoneal (NOT OVARIAN) CYSTECTOMY;  Surgeon: Florian Buff, MD;  Location: [facility];  Service: Gynecology;  Laterality: Right;  . MASS EXCISION Left 09/07/2015   Procedure: Minor SCAR EXCISION LEFT BREAST, PLASTIC CLOSURE;  Surgeon: Cristine Polio, MD;  Location: [place];  Service: Plastics;  Laterality: Left;  . TYMPANOSTOMY TUBE PLACEMENT Bilateral 10/2014     OB History    Gravida  1   Para  1   Term  1   Preterm      AB      Living  1     SAB      TAB      Ectopic      Multiple      Live Births  1            Home Medications    Prior to Admission medications   Medication Sig Start Date End Date Taking? Authorizing Provider  albuterol (PROVENTIL HFA;VENTOLIN HFA) 108 (90 Base) MCG/ACT inhaler Inhale 2 puffs into the lungs every 6 (six) hours as needed for wheezing or shortness of breath.    [provider]  ALPRAZolam Duanne Moron) 1 MG tablet  TAKE 1 TABLET BY MOUTH THREE TIMES DAILY 07/26/16   Florian Buff, MD  clindamycin (CLEOCIN) 150 MG capsule Take 3 capsules (450 mg total) by mouth 3 (three) times daily for 7 days. 05/08/19 05/15/19  Frederica Kuster, PA-C  docusate sodium (COLACE) 250 MG capsule Take 1 capsule (250 mg total) by mouth daily. 05/08/19   Vieno Tarrant, Bea Graff, PA-C  escitalopram (LEXAPRO) 10 MG tablet Take 10 mg by mouth daily.    [provider]  ibuprofen (ADVIL,MOTRIN) 800 MG tablet Take 1 tablet (800 mg total) by mouth 3 (three) times daily. 02/28/18   Avie Echevaria B, PA-C  naproxen sodium (ANAPROX) 550 MG tablet TAKE 1 TABLET(550 MG) BY MOUTH TWICE DAILY WITH A MEAL 01/01/19   Florian Buff, MD  progesterone  (PROMETRIUM) 200 MG capsule Take 1 capsule (200 mg total) by mouth daily. 10/04/18   Florian Buff, MD  Saccharomyces boulardii (PROBIOTIC) 250 MG CAPS Take 1 capsule by mouth daily. 05/08/19   Beryle Bagsby, Bea Graff, PA-C  sitaGLIPtin (JANUVIA) 50 MG tablet Take 50 mg by mouth daily.    [provider]  SUMAtriptan (IMITREX) 50 MG tablet Take 50 mg by mouth every 2 (two) hours as needed for migraine. May repeat in 2 hours if headache persists or recurs.    [provider]    Family History Family History  Problem Relation Age of Onset  . Diabetes Father   . Hypertension Father   . Heart disease Daughter        heart murmur  . Cancer Maternal Grandmother        ovarian cancer  . Obesity Paternal Grandmother   . Diabetes Paternal Grandfather     Social History Social History   Tobacco Use  . Smoking status: Current Every Day Smoker    Packs/day: 0.50    Years: 7.00    Pack years: 3.50    Types: Cigarettes  . Smokeless tobacco: Never Used  Substance Use Topics  . Alcohol use: Yes    Comment: occasionally  . Drug use: No     Allergies   Patient has no known allergies.   Review of Systems Review of Systems  Constitutional: Negative for chills and fever.  HENT: Negative for facial swelling and sore throat.   Respiratory: Negative for shortness of breath.   Cardiovascular: Negative for chest pain.  Gastrointestinal: Negative for abdominal pain, nausea and vomiting.  Genitourinary: Negative for dysuria.  Musculoskeletal: Negative for back pain.  Skin: Positive for wound (abscess). Negative for rash.  Neurological: Negative for headaches.  Psychiatric/Behavioral: The patient is not nervous/anxious.      Physical Exam Updated Vital Signs BP (!) 150/98 (BP Location: Right Wrist)   Pulse 97   Temp 98.6 F (37 C) (Oral)   Resp 16   LMP 04/13/2019 (Exact Date)   SpO2 100%   Physical Exam Vitals signs and nursing note reviewed.  Constitutional:       General: She is not in acute distress.    Appearance: She is well-developed. She is not diaphoretic.  HENT:     Head: Normocephalic and atraumatic.     Mouth/Throat:     Pharynx: No oropharyngeal exudate.  Eyes:     General: No scleral icterus.       Right eye: No discharge.        Left eye: No discharge.     Conjunctiva/sclera: Conjunctivae normal.     Pupils: Pupils are equal, round, and  reactive to light.  Neck:     Musculoskeletal: Normal range of motion and neck supple.     Thyroid: No thyromegaly.  Cardiovascular:     Rate and Rhythm: Normal rate and regular rhythm.     Heart sounds: Normal heart sounds. No murmur. No friction rub. No gallop.   Pulmonary:     Effort: Pulmonary effort is normal. No respiratory distress.     Breath sounds: Normal breath sounds. No stridor. No wheezing or rales.  Abdominal:     General: Bowel sounds are normal. There is no distension.     Palpations: Abdomen is soft.     Tenderness: There is no abdominal tenderness. There is no guarding or rebound.  Lymphadenopathy:     Cervical: No cervical adenopathy.  Skin:    General: Skin is warm and dry.     Coloration: Skin is not pale.     Findings: No rash.     Comments: Large, 6-7 cm area of induration with 3 cm area of fluctuance in the center under the R axilla  Neurological:     Mental Status: She is alert.     Coordination: Coordination normal.      ED Treatments / Results  Labs (all labs ordered are listed, but only abnormal results are displayed) Labs Reviewed - No data to display  EKG None  Radiology No results found.  Procedures Ultrasound ED Soft Tissue  Date/Time: 05/08/2019 3:28 PM Performed by: Frederica Kuster, PA-C Authorized by: Frederica Kuster, PA-C   Procedure details:    Indications: localization of abscess and evaluate for cellulitis     Transverse view:  Visualized   Longitudinal view:  Visualized   Images: archived     Limitations:  Body habitus  Location:    Location: axilla     Side:  Right Findings:     abscess present    cellulitis present    no foreign body present .Marland KitchenIncision and Drainage  Date/Time: 05/08/2019 3:29 PM Performed by: Frederica Kuster, PA-C Authorized by: Frederica Kuster, PA-C   Consent:    Consent obtained:  Verbal   Consent given by:  Patient   Risks discussed:  Incomplete drainage, infection, bleeding and pain   Alternatives discussed:  No treatment Location:    Type:  Abscess   Size:  3x3cm   Location:  Upper extremity   Upper extremity location: axilla. Pre-procedure details:    Skin preparation:  Chloraprep Anesthesia (see MAR for exact dosages):    Anesthesia method:  Local infiltration   Local anesthetic:  Lidocaine 2% WITH epi Procedure type:    Complexity:  Complex Procedure details:    Needle aspiration: no     Incision types:  Single straight   Incision depth:  Dermal   Scalpel blade:  11   Wound management:  Probed and deloculated, irrigated with saline and extensive cleaning   Drainage:  Purulent   Drainage amount:  Copious   Wound treatment:  Wound left open   Packing materials:  1/4 in gauze   Amount 1/4":  20-30 cm   (including critical care time)  Medications Ordered in ED Medications  lidocaine-EPINEPHrine (XYLOCAINE W/EPI) 2 %-1:200000 (PF) injection 10 mL (10 mLs Infiltration Given by Other 05/08/19 1358)     Initial Impression / Assessment and Plan / ED Course  I have reviewed the triage vital signs and the nursing notes.  Pertinent labs & imaging results that were available during my care of  the patient were reviewed by me and considered in my medical decision making (see chart for details).        Patient with skin abscess. Incision and drainage performed in the ED today.  Abscess large and warranted packing.  Wound recheck and packing removal in 2 days. Supportive care and return precautions discussed.  Pt sent home with clindamycin, probiotic.  Patient  takes Percocet for chronic pain and does not have a stool softener right now, will give Colace.  Patient understands and agrees with plan.  Patient vitals stable her ED course and discharged in satisfactory condition.    Final Clinical Impressions(s) / ED Diagnoses   Final diagnoses:  Abscess of axilla, right    ED Discharge Orders         Ordered    clindamycin (CLEOCIN) 150 MG capsule  3 times daily     05/08/19 1340    Saccharomyces boulardii (PROBIOTIC) 250 MG CAPS  Daily     05/08/19 1340    docusate sodium (COLACE) 250 MG capsule  Daily     05/08/19 [address], [place] 05/08/19 1531    Milton Ferguson, MD 05/08/19 [phone]

## 2019-07-19 ENCOUNTER — Other Ambulatory Visit: Payer: Self-pay | Admitting: Obstetrics & Gynecology

## 2019-07-19 DIAGNOSIS — N83202 Unspecified ovarian cyst, left side: Secondary | ICD-10-CM

## 2019-07-19 DIAGNOSIS — N7011 Chronic salpingitis: Secondary | ICD-10-CM

## 2019-07-22 ENCOUNTER — Other Ambulatory Visit: Payer: Self-pay

## 2019-07-22 ENCOUNTER — Ambulatory Visit (INDEPENDENT_AMBULATORY_CARE_PROVIDER_SITE_OTHER): Payer: Medicaid Other

## 2019-07-22 DIAGNOSIS — N7011 Chronic salpingitis: Secondary | ICD-10-CM | POA: Diagnosis not present

## 2019-07-22 DIAGNOSIS — N83202 Unspecified ovarian cyst, left side: Secondary | ICD-10-CM

## 2019-07-22 NOTE — Progress Notes (Signed)
PELVIC US TA/TV:homogeneous axial positioned uterus,wnl,EEC 6.8 mm,normal ovaries bilat,no free fluid,no pain during ultrasound,ovaries appear mobile

## 2019-12-12 LAB — HEMOGLOBIN A1C: Hemoglobin A1C: 13.3

## 2020-01-21 ENCOUNTER — Ambulatory Visit (INDEPENDENT_AMBULATORY_CARE_PROVIDER_SITE_OTHER): Payer: Medicaid Other | Admitting: "Endocrinology

## 2020-01-21 ENCOUNTER — Encounter: Payer: Self-pay | Admitting: "Endocrinology

## 2020-01-21 ENCOUNTER — Other Ambulatory Visit: Payer: Self-pay

## 2020-01-21 ENCOUNTER — Ambulatory Visit: Payer: Medicaid Other | Admitting: "Endocrinology

## 2020-01-21 VITALS — BP 126/85 | HR 99 | Ht 67.5 in | Wt 367.4 lb

## 2020-01-21 DIAGNOSIS — E1165 Type 2 diabetes mellitus with hyperglycemia: Secondary | ICD-10-CM | POA: Diagnosis not present

## 2020-01-21 DIAGNOSIS — E782 Mixed hyperlipidemia: Secondary | ICD-10-CM | POA: Insufficient documentation

## 2020-01-21 DIAGNOSIS — I10 Essential (primary) hypertension: Secondary | ICD-10-CM | POA: Insufficient documentation

## 2020-01-21 NOTE — Progress Notes (Signed)
Endocrinology Consult Note       01/21/2020, 10:23 PM   Subjective:    Patient ID: Judy Nelson, female    DOB: May 28, 1986.  Judy Nelson is being seen in consultation for management of currently uncontrolled symptomatic diabetes requested by  [facility].   Past Medical History:  Diagnosis Date  . Anxiety   . Depression   . Heartburn 09/01/2015   no current med.  Marland Kitchen History of anemia 01/2014  . Non-insulin dependent type 2 diabetes mellitus ([place])   . Obesity   . PTSD (post-traumatic stress disorder)   . Scar of breast 08/2015   left  . Sinus headache     Past Surgical History:  Procedure Laterality Date  . BREAST SURGERY    . CRANIOTOMY N/A 06/30/2017   Procedure: BIFRONTAL CRANIOTOMY;  Surgeon: Consuella Lose, MD;  Location: [place];  Service: Neurosurgery;  Laterality: N/A;  BIFRONTAL CRANIOTOMY Repair of Encephalocele  . INCISION AND DRAINAGE ABSCESS Left 10/07/2014   Procedure: INCISION AND DRAINAGE LEFT BREAST ABSCESS;  Surgeon: Jamesetta So, MD;  Location: [facility];  Service: General;  Laterality: Left;  . LAPAROSCOPIC OVARIAN CYSTECTOMY Right 02/05/2014   Procedure: LAPAROSCOPIC right retroperitoneal (NOT OVARIAN) CYSTECTOMY;  Surgeon: Florian Buff, MD;  Location: [facility];  Service: Gynecology;  Laterality: Right;  . MASS EXCISION Left 09/07/2015   Procedure: Minor SCAR EXCISION LEFT BREAST, PLASTIC CLOSURE;  Surgeon: Cristine Polio, MD;  Location: [place];  Service: Plastics;  Laterality: Left;  . TYMPANOSTOMY TUBE PLACEMENT Bilateral 10/2014    Social History   Socioeconomic History  . Marital status: Single    Spouse name: Not on file  . Number of children: Not on file  . Years of education: Not on file  . Highest education level: Not on file  Occupational History  . Not on file  Tobacco Use  . Smoking status: Current Every Day Smoker     Packs/day: 0.50    Years: 7.00    Pack years: 3.50    Types: Cigarettes  . Smokeless tobacco: Never Used  Substance and Sexual Activity  . Alcohol use: Yes    Comment: occasionally  . Drug use: No  . Sexual activity: Yes    Birth control/protection: None  Other Topics Concern  . Not on file  Social History Narrative  . Not on file   Social Determinants of Health   Financial Resource Strain:   . Difficulty of Paying Living Expenses:   Food Insecurity:   . Worried About Charity fundraiser in the Last Year:   . Arboriculturist in the Last Year:   Transportation Needs:   . Film/video editor (Medical):   Marland Kitchen Lack of Transportation (Non-Medical):   Physical Activity:   . Days of Exercise per Week:   . Minutes of Exercise per Session:   Stress:   . Feeling of Stress :   Social Connections:   . Frequency of Communication with Friends and Family:   . Frequency of Social Gatherings with Friends and Family:   . Attends Religious Services:   .  Active Member of Clubs or Organizations:   . Attends Archivist Meetings:   Marland Kitchen Marital Status:     Family History  Problem Relation Age of Onset  . Diabetes Father   . Hypertension Father   . Heart disease Daughter        heart murmur  . Cancer Maternal Grandmother        ovarian cancer  . Obesity Paternal Grandmother   . Diabetes Paternal Grandfather     Outpatient Encounter Medications as of 01/21/2020  Medication Sig  . amphetamine-dextroamphetamine (ADDERALL XR) 30 MG 24 hr capsule Take 30 mg by mouth 2 (two) times daily.  Marland Kitchen buPROPion (WELLBUTRIN XL) 300 MG 24 hr tablet Take 300 mg by mouth daily.  . DULoxetine (CYMBALTA) 60 MG capsule Take 60 mg by mouth daily.  Marland Kitchen gabapentin (NEURONTIN) 600 MG tablet Take 600 mg by mouth at bedtime.  Marland Kitchen glipiZIDE (GLUCOTROL XL) 10 MG 24 hr tablet Take 10 mg by mouth daily with breakfast.  . HYDROXYZINE HCL PO Take 50 mg by mouth at bedtime.  . insulin glargine (LANTUS) 100 UNIT/ML  injection Inject 80 Units into the skin at bedtime.  Marland Kitchen oxyCODONE-acetaminophen (PERCOCET) 10-325 MG tablet Take 1 tablet by mouth every 4 (four) hours as needed for pain.  Marland Kitchen albuterol (PROVENTIL HFA;VENTOLIN HFA) 108 (90 Base) MCG/ACT inhaler Inhale 2 puffs into the lungs every 6 (six) hours as needed for wheezing or shortness of breath.  . ALPRAZolam (XANAX) 1 MG tablet TAKE 1 TABLET BY MOUTH THREE TIMES DAILY  . sitaGLIPtin (JANUVIA) 50 MG tablet Take 50 mg by mouth daily.  . [DISCONTINUED] docusate sodium (COLACE) 250 MG capsule Take 1 capsule (250 mg total) by mouth daily.  . [DISCONTINUED] escitalopram (LEXAPRO) 10 MG tablet Take 10 mg by mouth daily.  . [DISCONTINUED] ibuprofen (ADVIL,MOTRIN) 800 MG tablet Take 1 tablet (800 mg total) by mouth 3 (three) times daily.  . [DISCONTINUED] naproxen sodium (ANAPROX) 550 MG tablet TAKE 1 TABLET(550 MG) BY MOUTH TWICE DAILY WITH A MEAL  . [DISCONTINUED] progesterone (PROMETRIUM) 200 MG capsule Take 1 capsule (200 mg total) by mouth daily.  . [DISCONTINUED] Saccharomyces boulardii (PROBIOTIC) 250 MG CAPS Take 1 capsule by mouth daily.  . [DISCONTINUED] SUMAtriptan (IMITREX) 50 MG tablet Take 50 mg by mouth every 2 (two) hours as needed for migraine. May repeat in 2 hours if headache persists or recurs.   No facility-administered encounter medications on file as of 01/21/2020.    ALLERGIES: No Known Allergies  VACCINATION STATUS: Immunization History  Administered Date(s) Administered  . Influenza,inj,Quad PF,6+ Mos 07/16/2013  . Tdap 11/04/2013    Diabetes She presents for her initial diabetic visit. She has type 2 diabetes mellitus. Onset time: She was diagnosed at approximate age of 56 years. Her disease course has been worsening. There are no hypoglycemic associated symptoms. Pertinent negatives for hypoglycemia include no confusion, headaches, pallor or seizures. Associated symptoms include blurred vision, fatigue, polydipsia and polyuria.  Pertinent negatives for diabetes include no chest pain and no polyphagia. There are no hypoglycemic complications. Symptoms are worsening. Diabetic complications include peripheral neuropathy. Risk factors for coronary artery disease include dyslipidemia, diabetes mellitus, obesity, sedentary lifestyle, tobacco exposure and family history. Current diabetic treatment includes insulin injections. Her weight is increasing steadily. She is following a generally unhealthy diet. When asked about meal planning, she reported none. She has not had a previous visit with a dietitian. She never participates in exercise. Her home blood glucose trend  is increasing steadily. (She did not bring any logs nor meter to review.  Her recent A1c was 13.3%.) An ACE inhibitor/angiotensin II receptor blocker is not being taken.  Hyperlipidemia This is a chronic problem. The current episode started more than 1 year ago. The problem is uncontrolled. Exacerbating diseases include diabetes. Factors aggravating her hyperlipidemia include smoking. Pertinent negatives include no chest pain, myalgias or shortness of breath. She is currently on no antihyperlipidemic treatment. Risk factors for coronary artery disease include diabetes mellitus, dyslipidemia, family history, obesity and a sedentary lifestyle.  Hypertension This is a chronic problem. The problem is controlled. Associated symptoms include blurred vision. Pertinent negatives include no chest pain, headaches, palpitations or shortness of breath. Risk factors for coronary artery disease include dyslipidemia, diabetes mellitus, smoking/tobacco exposure, sedentary lifestyle and family history. Past treatments include calcium channel blockers.     Review of Systems  Constitutional: Positive for fatigue. Negative for chills, fever and unexpected weight change.  HENT: Negative for trouble swallowing and voice change.   Eyes: Positive for blurred vision. Negative for visual  disturbance.  Respiratory: Negative for cough, shortness of breath and wheezing.   Cardiovascular: Negative for chest pain, palpitations and leg swelling.  Gastrointestinal: Negative for diarrhea, nausea and vomiting.  Endocrine: Positive for polydipsia and polyuria. Negative for cold intolerance, heat intolerance and polyphagia.  Musculoskeletal: Negative for arthralgias and myalgias.  Skin: Negative for color change, pallor, rash and wound.  Neurological: Negative for seizures and headaches.  Psychiatric/Behavioral: Negative for confusion and suicidal ideas.    Objective:    Vitals with BMI 01/21/2020 05/08/2019 05/08/2019  Height 5' 7.5" - -  Weight 367 lbs 6 oz - -  BMI 0000000 - -  Systolic [id number] [id number] [id number]  Diastolic 85 98 93  Pulse 99 97 92    BP 126/85   Pulse 99   Ht 5' 7.5" (1.715 m)   Wt (!) 367 lb 6.4 oz (166.7 kg)   BMI 56.69 kg/m   Wt Readings from Last 3 Encounters:  01/21/20 (!) 367 lb 6.4 oz (166.7 kg)  11/09/18 (!) 353 lb (160.1 kg)  10/04/18 (!) 353 lb (160.1 kg)     Physical Exam Constitutional:      Appearance: She is well-developed.  HENT:     Head: Normocephalic and atraumatic.  Neck:     Thyroid: No thyromegaly.     Trachea: No tracheal deviation.  Cardiovascular:     Rate and Rhythm: Normal rate and regular rhythm.  Pulmonary:     Effort: Pulmonary effort is normal.  Abdominal:     Tenderness: There is no abdominal tenderness. There is no guarding.  Musculoskeletal:        General: Normal range of motion.     Cervical back: Normal range of motion and neck supple.  Skin:    General: Skin is warm and dry.     Coloration: Skin is not pale.     Findings: No erythema or rash.  Neurological:     Mental Status: She is alert and oriented to person, place, and time.     Cranial Nerves: No cranial nerve deficit.     Coordination: Coordination normal.     Deep Tendon Reflexes: Reflexes are normal and symmetric.  Psychiatric:     Comments: Reluctant  affect.     CMP ( most recent) CMP     Component Value Date/Time   NA 136 03/12/2018 1315   NA 138 07/20/2015 1119   K 3.3 (  L) 03/12/2018 1315   CL 100 (L) 03/12/2018 1315   CO2 25 03/12/2018 1315   GLUCOSE 283 (H) 03/12/2018 1315   BUN 7 03/12/2018 1315   BUN 6 07/20/2015 1119   CREATININE 0.85 03/12/2018 1315   CREATININE 0.44 (L) 10/22/2013 1247   CALCIUM 9.0 03/12/2018 1315   PROT 7.8 04/14/2016 0038   PROT 7.3 07/20/2015 1119   ALBUMIN 3.5 04/14/2016 0038   ALBUMIN 3.8 07/20/2015 1119   AST 14 (L) 04/14/2016 0038   ALT 19 04/14/2016 0038   ALKPHOS 104 04/14/2016 0038   BILITOT 0.3 04/14/2016 0038   BILITOT 0.3 07/20/2015 1119   GFRNONAA >60 03/12/2018 1315   GFRAA >60 03/12/2018 1315     Diabetic Labs (most recent): Lab Results  Component Value Date   HGBA1C 13.3 12/12/2019   HGBA1C 6.8 (H) 07/20/2015   HGBA1C 7.9 (H) 10/05/2014     Lipid Panel ( most recent) Lipid Panel     Component Value Date/Time   CHOL 190 07/20/2015 1119   TRIG 99 07/20/2015 1119   HDL 43 07/20/2015 1119   CHOLHDL 4.4 07/20/2015 1119   LDLCALC 127 (H) 07/20/2015 1119   LABVLDL 20 07/20/2015 1119     Lab Results  Component Value Date   TSH 1.580 07/20/2015   TSH 1.250 10/05/2014      Assessment & Plan:   1. Uncontrolled type 2 diabetes mellitus with hyperglycemia ([place])  - Milli Desmet has currently uncontrolled symptomatic type 2 DM since  34 years of age,  with most recent A1c of 13.3 %. Recent labs reviewed. - I had a long discussion with her about the progressive nature of diabetes and the pathology behind its complications. -her diabetes is complicated by peripheral neuropathy, obesity/sedentary life, smoking and she remains at a high risk for more acute and chronic complications which include CAD, CVA, CKD, retinopathy, and neuropathy. These are all discussed in detail with her.  - I have counseled her on diet  and weight management  by adopting a carbohydrate  restricted/protein rich diet. Patient is encouraged to switch to  unprocessed or minimally processed     complex starch and increased protein intake (animal or plant source), fruits, and vegetables. -  she is advised to stick to a routine mealtimes to eat 3 meals  a day and avoid unnecessary snacks ( to snack only to correct hypoglycemia).   - she admits that there is a room for improvement in her food and drink choices. - Suggestion is made for her to avoid simple carbohydrates  from her diet including Cakes, Sweet Desserts, Ice Cream, Soda (diet and regular), Sweet Tea, Candies, Chips, Cookies, Store Bought Juices, Alcohol in Excess of  1-2 drinks a day, Artificial Sweeteners,  Coffee Creamer, and "Sugar-free" Products. This will help patient to have more stable blood glucose profile and potentially avoid unintended weight gain.  - she will be scheduled with Jearld Fenton, RDN, CDE for diabetes education.  - I have approached her with the following individualized plan to manage  her diabetes and patient agrees:   -Based on  her chronic glycemic burden, she will likely require intensive treatment with basal/bolus insulin in order for her to achieve control of diabetes to target. -for that to happen she will have to engage for proper monitoring of blood glucose for safe use of insulin. -She reluctantly accepts to monitor blood glucose 4 times a day-before meals and at bedtime and return in 10 days for reevaluation. -In  the meantime, she is advised to increase her Lantus to 80 units nightly.    - she is warned not to take insulin without proper monitoring per orders.  - she is encouraged to call clinic for blood glucose levels less than 70 or above 300 mg /dl. - she is advised to continue glipizide 10 mg XL p.o. daily at breakfast, Januvia 50 mg p.o. daily at breakfast.  She does not tolerate Metformin.     - she will be to high risk to give  GLP1 RA for smoking.    - Specific targets for   A1c;  LDL, HDL,  and Triglycerides were discussed with the patient.  2) Blood Pressure /Hypertension:  her blood pressure is  controlled to target.   she is advised to continue her current medications including potassium 6120 mg p.o. daily.  3) Lipids/Hyperlipidemia:   Review of her recent lipid panel showed  controlled  LDL at 127 .  she  Is not on statins.  He is hesitant to start medications at this time.    4)  Weight/Diet:  Body mass index is 56.69 kg/m.  -   clearly complicating her diabetes care.   she is  a candidate for weight loss. I discussed with her the fact that loss of 5 - 10% of her  current body weight will have the most impact on her diabetes management.  Exercise, and detailed carbohydrates information provided  -  detailed on discharge instructions.  5) Chronic Care/Health Maintenance:  -she  Is not on ACEI/ARB and Statin medications and  is encouraged to initiate and continue to follow up with Ophthalmology, Dentist,  Podiatrist at least yearly or according to recommendations, and advised to  quit smoking. I have recommended yearly flu vaccine and pneumonia vaccine at least every 5 years; moderate intensity exercise for up to 150 minutes weekly; and  sleep for at least 7 hours a day.  - she is  advised to maintain close follow up with [facility] for primary care needs, as well as her other providers for optimal and coordinated care.   - Time spent in this patient care: 60 min, of which > 50% was spent in  counseling  her about her chronically uncontrolled type 2 diabetes, obesity/sedentary life, hyperlipidemia and the rest reviewing her blood glucose logs , discussing her hypoglycemia and hyperglycemia episodes, reviewing her current and  previous labs / studies  ( including abstraction from other facilities) and medications  doses and developing a  long term treatment plan based on the latest standards of care/ guidelines; and documenting her care.    Please  refer to Patient Instructions for Blood Glucose Monitoring and Insulin/Medications Dosing Guide"  in media tab for additional information. Please  also refer to " Patient Self Inventory" in the Media  tab for reviewed elements of pertinent patient history.  Judy Nelson participated in the discussions, expressed understanding, and voiced agreement with the above plans.  All questions were answered to her satisfaction. she is encouraged to contact clinic should she have any questions or concerns prior to her return visit.   Follow up plan: - Return in about 10 days (around 01/31/2020), or office, for Follow up with Meter and Logs Only - no Labs.  Glade Lloyd, MD [facility] [address] [place], [place] [address] Phone: [phone]  Fax: [phone]    01/21/2020, 10:23 PM  This note was partially dictated with voice recognition software.  Similar sounding words can be transcribed inadequately or may not  be corrected upon review.

## 2020-01-21 NOTE — Patient Instructions (Signed)

## 2020-02-03 ENCOUNTER — Ambulatory Visit (INDEPENDENT_AMBULATORY_CARE_PROVIDER_SITE_OTHER): Payer: Medicaid Other | Admitting: "Endocrinology

## 2020-02-03 ENCOUNTER — Other Ambulatory Visit: Payer: Self-pay

## 2020-02-03 ENCOUNTER — Encounter: Payer: Self-pay | Admitting: "Endocrinology

## 2020-02-03 VITALS — BP 130/83 | HR 114 | Ht 67.5 in | Wt 361.4 lb

## 2020-02-03 DIAGNOSIS — I10 Essential (primary) hypertension: Secondary | ICD-10-CM

## 2020-02-03 DIAGNOSIS — E1165 Type 2 diabetes mellitus with hyperglycemia: Secondary | ICD-10-CM | POA: Diagnosis not present

## 2020-02-03 DIAGNOSIS — E782 Mixed hyperlipidemia: Secondary | ICD-10-CM

## 2020-02-03 MED ORDER — NOVOLOG FLEXPEN 100 UNIT/ML ~~LOC~~ SOPN: 10.0000 [IU] | PEN_INJECTOR | Freq: Three times a day (TID) | SUBCUTANEOUS | 2 refills | Status: DC

## 2020-02-03 NOTE — Progress Notes (Signed)
02/03/2020, 5:09 PM  Endocrinology follow-up note   Subjective:    Patient ID: Judy Nelson, female    DOB: Jan 17, 1986.  Judy Nelson is being seen in follow-up after she was seen in consultation for management of currently uncontrolled symptomatic diabetes requested by  [facility].   Past Medical History:  Diagnosis Date  . Anxiety   . Depression   . Heartburn 09/01/2015   no current med.  Marland Kitchen History of anemia 01/2014  . Non-insulin dependent type 2 diabetes mellitus ([place])   . Obesity   . PTSD (post-traumatic stress disorder)   . Scar of breast 08/2015   left  . Sinus headache     Past Surgical History:  Procedure Laterality Date  . BREAST SURGERY    . CRANIOTOMY N/A 06/30/2017   Procedure: BIFRONTAL CRANIOTOMY;  Surgeon: Consuella Lose, MD;  Location: [facility];  Service: Neurosurgery;  Laterality: N/A;  BIFRONTAL CRANIOTOMY Repair of Encephalocele  . INCISION AND DRAINAGE ABSCESS Left 10/07/2014   Procedure: INCISION AND DRAINAGE LEFT BREAST ABSCESS;  Surgeon: Jamesetta So, MD;  Location: [facility];  Service: General;  Laterality: Left;  . LAPAROSCOPIC OVARIAN CYSTECTOMY Right 02/05/2014   Procedure: LAPAROSCOPIC right retroperitoneal (NOT OVARIAN) CYSTECTOMY;  Surgeon: Florian Buff, MD;  Location: [facility];  Service: Gynecology;  Laterality: Right;  . MASS EXCISION Left 09/07/2015   Procedure: Minor SCAR EXCISION LEFT BREAST, PLASTIC CLOSURE;  Surgeon: Cristine Polio, MD;  Location: [facility];  Service: Plastics;  Laterality: Left;  . TYMPANOSTOMY TUBE PLACEMENT Bilateral 10/2014    Social History   Socioeconomic History  . Marital status: Single    Spouse name: Not on file  . Number of children: Not on file  . Years of education: Not on file  . Highest education level: Not on file  Occupational History  . Not on file  Tobacco Use  . Smoking status: Current Every  Day Smoker    Packs/day: 0.50    Years: 7.00    Pack years: 3.50    Types: Cigarettes  . Smokeless tobacco: Never Used  Substance and Sexual Activity  . Alcohol use: Yes    Comment: occasionally  . Drug use: No  . Sexual activity: Yes    Birth control/protection: None  Other Topics Concern  . Not on file  Social History Narrative  . Not on file   Social Determinants of Health   Financial Resource Strain:   . Difficulty of Paying Living Expenses:   Food Insecurity:   . Worried About Charity fundraiser in the Last Year:   . Arboriculturist in the Last Year:   Transportation Needs:   . Film/video editor (Medical):   Marland Kitchen Lack of Transportation (Non-Medical):   Physical Activity:   . Days of Exercise per Week:   . Minutes of Exercise per Session:   Stress:   . Feeling of Stress :   Social Connections:   . Frequency of Communication with Friends and Family:   . Frequency of Social Gatherings with Friends and Family:   . Attends Religious Services:   . Active Member of Clubs or Organizations:   . Attends Archivist Meetings:   .  Marital Status:     Family History  Problem Relation Age of Onset  . Diabetes Father   . Hypertension Father   . Heart disease Daughter        heart murmur  . Cancer Maternal Grandmother        ovarian cancer  . Obesity Paternal Grandmother   . Diabetes Paternal Grandfather     Outpatient Encounter Medications as of 02/03/2020  Medication Sig  . albuterol (PROVENTIL HFA;VENTOLIN HFA) 108 (90 Base) MCG/ACT inhaler Inhale 2 puffs into the lungs every 6 (six) hours as needed for wheezing or shortness of breath.  . ALPRAZolam (XANAX) 1 MG tablet TAKE 1 TABLET BY MOUTH THREE TIMES DAILY  . amphetamine-dextroamphetamine (ADDERALL XR) 30 MG 24 hr capsule Take 30 mg by mouth 2 (two) times daily.  Marland Kitchen buPROPion (WELLBUTRIN XL) 300 MG 24 hr tablet Take 300 mg by mouth daily.  . DULoxetine (CYMBALTA) 60 MG capsule Take 60 mg by mouth daily.   Marland Kitchen gabapentin (NEURONTIN) 600 MG tablet Take 600 mg by mouth at bedtime.  Marland Kitchen glipiZIDE (GLUCOTROL XL) 10 MG 24 hr tablet Take 10 mg by mouth daily with breakfast.  . HYDROXYZINE HCL PO Take 50 mg by mouth at bedtime.  . insulin aspart (NOVOLOG FLEXPEN) 100 UNIT/ML FlexPen Inject 10-16 Units into the skin 3 (three) times daily with meals.  . insulin glargine (LANTUS) 100 UNIT/ML injection Inject 80 Units into the skin at bedtime.  Marland Kitchen oxyCODONE-acetaminophen (PERCOCET) 10-325 MG tablet Take 1 tablet by mouth every 4 (four) hours as needed for pain.  . sitaGLIPtin (JANUVIA) 50 MG tablet Take 50 mg by mouth daily.   No facility-administered encounter medications on file as of 02/03/2020.    ALLERGIES: No Known Allergies  VACCINATION STATUS: Immunization History  Administered Date(s) Administered  . Influenza,inj,Quad PF,6+ Mos 07/16/2013  . Tdap 11/04/2013    Diabetes She presents for her follow-up diabetic visit. She has type 2 diabetes mellitus. Onset time: She was diagnosed at approximate age of 34 years. Her disease course has been worsening. There are no hypoglycemic associated symptoms. Pertinent negatives for hypoglycemia include no confusion, headaches, pallor or seizures. Associated symptoms include blurred vision, fatigue, polydipsia and polyuria. Pertinent negatives for diabetes include no chest pain and no polyphagia. There are no hypoglycemic complications. Symptoms are improving. Diabetic complications include peripheral neuropathy. Risk factors for coronary artery disease include dyslipidemia, diabetes mellitus, obesity, sedentary lifestyle, tobacco exposure and family history. Current diabetic treatment includes insulin injections. Her weight is decreasing steadily. She is following a generally unhealthy diet. When asked about meal planning, she reported none. She has not had a previous visit with a dietitian. She never participates in exercise. Her home blood glucose trend is  increasing steadily. Her breakfast blood glucose range is generally >200 mg/dl. Her lunch blood glucose range is generally >200 mg/dl. Her dinner blood glucose range is generally >200 mg/dl. Her bedtime blood glucose range is generally >200 mg/dl. Her overall blood glucose range is >200 mg/dl. (She brought in her log showing persistently above 250 mg/dl glycemic profile both fasting and postprandial.    Her recent A1c was 13.3%.) An ACE inhibitor/angiotensin II receptor blocker is not being taken.  Hyperlipidemia This is a chronic problem. The current episode started more than 1 year ago. The problem is uncontrolled. Exacerbating diseases include diabetes. Factors aggravating her hyperlipidemia include smoking. Pertinent negatives include no chest pain, myalgias or shortness of breath. She is currently on no antihyperlipidemic treatment. Risk factors  for coronary artery disease include diabetes mellitus, dyslipidemia, family history, obesity and a sedentary lifestyle.  Hypertension This is a chronic problem. The problem is controlled. Associated symptoms include blurred vision. Pertinent negatives include no chest pain, headaches, palpitations or shortness of breath. Risk factors for coronary artery disease include dyslipidemia, diabetes mellitus, smoking/tobacco exposure, sedentary lifestyle and family history. Past treatments include calcium channel blockers.     Review of Systems  Constitutional: Positive for fatigue. Negative for chills, fever and unexpected weight change.  HENT: Negative for trouble swallowing and voice change.   Eyes: Positive for blurred vision. Negative for visual disturbance.  Respiratory: Negative for cough, shortness of breath and wheezing.   Cardiovascular: Negative for chest pain, palpitations and leg swelling.  Gastrointestinal: Negative for diarrhea, nausea and vomiting.  Endocrine: Positive for polydipsia and polyuria. Negative for cold intolerance, heat intolerance  and polyphagia.  Musculoskeletal: Negative for arthralgias and myalgias.  Skin: Negative for color change, pallor, rash and wound.  Neurological: Negative for seizures and headaches.  Psychiatric/Behavioral: Negative for confusion and suicidal ideas.    Objective:    Vitals with BMI 02/03/2020 01/21/2020 05/08/2019  Height 5' 7.5" 5' 7.5" -  Weight 361 lbs 6 oz 367 lbs 6 oz -  BMI 0000000 0000000 -  Systolic [id number]  Diastolic 83 85 98  Pulse [id number] 99 97    BP 130/83   Pulse (!) 114   Ht 5' 7.5" (1.715 m)   Wt (!) 361 lb 6.4 oz (163.9 kg)   BMI 55.77 kg/m   Wt Readings from Last 3 Encounters:  02/03/20 (!) 361 lb 6.4 oz (163.9 kg)  01/21/20 (!) 367 lb 6.4 oz (166.7 kg)  11/09/18 (!) 353 lb (160.1 kg)     Physical Exam Constitutional:      Appearance: She is well-developed.  HENT:     Head: Normocephalic and atraumatic.  Neck:     Thyroid: No thyromegaly.     Trachea: No tracheal deviation.  Cardiovascular:     Rate and Rhythm: Normal rate and regular rhythm.  Pulmonary:     Effort: Pulmonary effort is normal.  Abdominal:     Tenderness: There is no abdominal tenderness. There is no guarding.  Musculoskeletal:        General: Normal range of motion.     Cervical back: Normal range of motion and neck supple.  Skin:    General: Skin is warm and dry.     Coloration: Skin is not pale.     Findings: No erythema or rash.  Neurological:     Mental Status: She is alert and oriented to person, place, and time.     Cranial Nerves: No cranial nerve deficit.     Coordination: Coordination normal.     Deep Tendon Reflexes: Reflexes are normal and symmetric.  Psychiatric:     Comments: Reluctant affect.     CMP ( most recent) CMP     Component Value Date/Time   NA 136 03/12/2018 1315   NA 138 07/20/2015 1119   K 3.3 (L) 03/12/2018 1315   CL 100 (L) 03/12/2018 1315   CO2 25 03/12/2018 1315   GLUCOSE 283 (H) 03/12/2018 1315   BUN 7 03/12/2018 1315   BUN 6 07/20/2015  1119   CREATININE 0.85 03/12/2018 1315   CREATININE 0.44 (L) 10/22/2013 1247   CALCIUM 9.0 03/12/2018 1315   PROT 7.8 04/14/2016 0038   PROT 7.3 07/20/2015 1119   ALBUMIN 3.5 04/14/2016 0038  ALBUMIN 3.8 07/20/2015 1119   AST 14 (L) 04/14/2016 0038   ALT 19 04/14/2016 0038   ALKPHOS 104 04/14/2016 0038   BILITOT 0.3 04/14/2016 0038   BILITOT 0.3 07/20/2015 1119   GFRNONAA >60 03/12/2018 1315   GFRAA >60 03/12/2018 1315     Diabetic Labs (most recent): Lab Results  Component Value Date   HGBA1C 13.3 12/12/2019   HGBA1C 6.8 (H) 07/20/2015   HGBA1C 7.9 (H) 10/05/2014     Lipid Panel ( most recent) Lipid Panel     Component Value Date/Time   CHOL 190 07/20/2015 1119   TRIG 99 07/20/2015 1119   HDL 43 07/20/2015 1119   CHOLHDL 4.4 07/20/2015 1119   LDLCALC 127 (H) 07/20/2015 1119   LABVLDL 20 07/20/2015 1119     Lab Results  Component Value Date   TSH 1.580 07/20/2015   TSH 1.250 10/05/2014      Assessment & Plan:   1. Uncontrolled type 2 diabetes mellitus with hyperglycemia ([place])  - Karimah Foxworth has currently uncontrolled symptomatic type 2 DM since  34 years of age,  with most recent A1c of 13.3 %. Recent labs reviewed. -She presents with persistent hyperglycemia. -her diabetes is complicated by peripheral neuropathy, obesity/sedentary life, smoking and she remains at a high risk for more acute and chronic complications which include CAD, CVA, CKD, retinopathy, and neuropathy. These are all discussed in detail with her.  - I have counseled her on diet  and weight management  by adopting a carbohydrate restricted/protein rich diet. Patient is encouraged to switch to  unprocessed or minimally processed     complex starch and increased protein intake (animal or plant source), fruits, and vegetables. -  she is advised to stick to a routine mealtimes to eat 3 meals  a day and avoid unnecessary snacks ( to snack only to correct hypoglycemia).   - she  admits there is  a room for improvement in her diet and drink choices. -  Suggestion is made for her to avoid simple carbohydrates  from her diet including Cakes, Sweet Desserts / Pastries, Ice Cream, Soda (diet and regular), Sweet Tea, Candies, Chips, Cookies, Sweet Pastries,  Store Bought Juices, Alcohol in Excess of  1-2 drinks a day, Artificial Sweeteners, Coffee Creamer, and "Sugar-free" Products. This will help patient to have stable blood glucose profile and potentially avoid unintended weight gain.   - she will be scheduled with Jearld Fenton, RDN, CDE for diabetes education.  - I have approached her with the following individualized plan to manage  her diabetes and patient agrees:   -Based on  her chronic glycemic burden, she will  require intensive treatment with basal/bolus insulin in order for her to achieve control of diabetes to target. -Accordingly, she is advised to continue monitoring blood glucose 4 times a day  -before meals and at bedtime. -She is advised to continue Lantus 80 units nightly, discussed and initiated NovoLog 10-16 units 3 times daily AC for Premeal blood glucose readings above 90 mg per DL.  - she is warned not to take insulin without proper monitoring per orders.  - she is encouraged to call clinic for blood glucose levels less than 70 or above 300 mg /dl. - she is advised to continue glipizide 10 mg XL p.o. daily at breakfast, Januvia 50 mg p.o. daily at breakfast.  She does not tolerate Metformin.     - she will be to high risk to give  GLP1 RA for smoking.    -  Specific targets for  A1c;  LDL, HDL,  and Triglycerides were discussed with the patient.  2) Blood Pressure /Hypertension:  Her blood pressure is controlled to target.   3) Lipids/Hyperlipidemia:   Review of her recent lipid panel showed  controlled  LDL at 127 .  she  Is not on statins.  She is hesitant to start medications at this time.  She will be considered for fasting lipid panel on subsequent  visit.   4)  Weight/Diet:  Body mass index is 55.77 kg/m.  -   clearly complicating her diabetes care.   she is  a candidate for weight loss. I discussed with her the fact that loss of 5 - 10% of her  current body weight will have the most impact on her diabetes management.  Exercise, and detailed carbohydrates information provided  -  detailed on discharge instructions.  5) Chronic Care/Health Maintenance:  -she  Is not on ACEI/ARB and Statin medications and  is encouraged to initiate and continue to follow up with Ophthalmology, Dentist,  Podiatrist at least yearly or according to recommendations, and advised to  quit smoking. I have recommended yearly flu vaccine and pneumonia vaccine at least every 5 years; moderate intensity exercise for up to 150 minutes weekly; and  sleep for at least 7 hours a day.  - she is  advised to maintain close follow up with [facility] for primary care needs, as well as her other providers for optimal and coordinated care.   - Time spent on this patient care encounter:  35 min, of which > 50% was spent in  counseling and the rest reviewing her blood glucose logs , discussing her hypoglycemia and hyperglycemia episodes, reviewing her current and  previous labs / studies  ( including abstraction from other facilities) and medications  doses and developing a  long term treatment plan and documenting her care.   Please refer to Patient Instructions for Blood Glucose Monitoring and Insulin/Medications Dosing Guide"  in media tab for additional information. Please  also refer to " Patient Self Inventory" in the Media  tab for reviewed elements of pertinent patient history.  Benjaman Pott participated in the discussions, expressed understanding, and voiced agreement with the above plans.  All questions were answered to her satisfaction. she is encouraged to contact clinic should she have any questions or concerns prior to her return visit.   Follow up  plan: - Return in about 5 weeks (around 03/09/2020) for Bring Meter and Logs- A1c in Office.  Glade Lloyd, MD [facility] [address] [place], [place] [address] Phone: [phone]  Fax: [phone]    02/03/2020, 5:09 PM  This note was partially dictated with voice recognition software. Similar sounding words can be transcribed inadequately or may not  be corrected upon review.

## 2020-02-03 NOTE — Patient Instructions (Signed)

## 2020-03-10 ENCOUNTER — Ambulatory Visit: Payer: Medicaid Other | Admitting: "Endocrinology

## 2020-04-25 ENCOUNTER — Other Ambulatory Visit: Payer: Self-pay | Admitting: "Endocrinology

## 2020-05-06 ENCOUNTER — Ambulatory Visit: Payer: Medicaid Other | Admitting: Nutrition

## 2020-05-06 DIAGNOSIS — F319 Bipolar disorder, unspecified: Secondary | ICD-10-CM | POA: Insufficient documentation

## 2020-05-25 ENCOUNTER — Telehealth: Payer: Self-pay | Admitting: "Endocrinology

## 2020-05-25 NOTE — Telephone Encounter (Signed)
Patient said that her plastic surgery was canceled due to her A1C being 12.6 and it needs to be below 8. Patient is requesting what she needs to do for this? Does she need an appt?

## 2020-05-25 NOTE — Telephone Encounter (Signed)
Tried to reach pt. She needs to get back on the schedule.

## 2020-05-25 NOTE — Telephone Encounter (Signed)
Yes, She needs to stay on plan given and return for a follow up visit. She missed her appt. in May.

## 2020-05-28 ENCOUNTER — Other Ambulatory Visit: Payer: Self-pay | Admitting: "Endocrinology

## 2020-06-09 ENCOUNTER — Encounter: Payer: Self-pay | Admitting: Obstetrics & Gynecology

## 2020-06-09 ENCOUNTER — Ambulatory Visit (INDEPENDENT_AMBULATORY_CARE_PROVIDER_SITE_OTHER): Payer: Medicaid Other | Admitting: Obstetrics & Gynecology

## 2020-06-09 VITALS — BP 145/95 | HR 121 | Ht 67.5 in | Wt 365.0 lb

## 2020-06-09 DIAGNOSIS — R102 Pelvic and perineal pain: Secondary | ICD-10-CM

## 2020-06-09 DIAGNOSIS — N83209 Unspecified ovarian cyst, unspecified side: Secondary | ICD-10-CM | POA: Diagnosis not present

## 2020-06-09 DIAGNOSIS — N912 Amenorrhea, unspecified: Secondary | ICD-10-CM

## 2020-06-09 LAB — POCT URINE PREGNANCY: Preg Test, Ur: NEGATIVE

## 2020-06-09 MED ORDER — MEDROXYPROGESTERONE ACETATE 10 MG PO TABS: 10.0000 mg | ORAL_TABLET | Freq: Every day | ORAL | 0 refills | Status: DC

## 2020-06-09 NOTE — Progress Notes (Signed)
Chief Complaint  Patient presents with  . Pelvic Pain    pt thinks she has an ovarian cyst      34 y.o. G1P1001 Patient's last menstrual period was 03/29/2020. The current method of family planning is none.  Outpatient Encounter Medications as of 06/09/2020  Medication Sig  . albuterol (PROVENTIL HFA;VENTOLIN HFA) 108 (90 Base) MCG/ACT inhaler Inhale 2 puffs into the lungs every 6 (six) hours as needed for wheezing or shortness of breath.  . ALPRAZolam (XANAX) 1 MG tablet TAKE 1 TABLET BY MOUTH THREE TIMES DAILY  . amphetamine-dextroamphetamine (ADDERALL XR) 30 MG 24 hr capsule Take 30 mg by mouth 2 (two) times daily.  Marland Kitchen buPROPion (WELLBUTRIN XL) 300 MG 24 hr tablet Take 300 mg by mouth daily.  . DULoxetine (CYMBALTA) 60 MG capsule Take 60 mg by mouth daily.  Marland Kitchen gabapentin (NEURONTIN) 600 MG tablet Take 600 mg by mouth at bedtime.  Marland Kitchen glipiZIDE (GLUCOTROL XL) 10 MG 24 hr tablet Take 10 mg by mouth daily with breakfast.  . HYDROXYZINE HCL PO Take 50 mg by mouth at bedtime.  . insulin glargine (LANTUS) 100 UNIT/ML injection Inject 80 Units into the skin at bedtime.  Marland Kitchen oxyCODONE-acetaminophen (PERCOCET) 10-325 MG tablet Take 1 tablet by mouth every 4 (four) hours as needed for pain.  . sitaGLIPtin (JANUVIA) 50 MG tablet Take 50 mg by mouth daily.  . [DISCONTINUED] NOVOLOG FLEXPEN 100 UNIT/ML FlexPen ADMINISTER 10 TO 16 UNITS UNDER THE SKIN THREE TIMES DAILY WITH MEALS  . [DISCONTINUED] medroxyPROGESTERone (PROVERA) 10 MG tablet Take 1 tablet (10 mg total) by mouth daily.   No facility-administered encounter medications on file as of 06/09/2020.    Subjective Patient has history of hydrosalpinx Was at some point a pyosalpinx but resolved on antibiotics Chronic now hydrosalpinx Also amenorrheic which happens from time to time No birth control method Some right lower quadrant pain not as severe in the past No abnormal discharge fevers or chills or diarrhea Past Medical History:   Diagnosis Date  . Anxiety   . Depression   . Heartburn 09/01/2015   no current med.  Marland Kitchen History of anemia 01/2014  . Non-insulin dependent type 2 diabetes mellitus ([place])   . Obesity   . PTSD (post-traumatic stress disorder)   . Scar of breast 08/2015   left  . Sinus headache     Past Surgical History:  Procedure Laterality Date  . BREAST SURGERY    . CRANIOTOMY N/A 06/30/2017   Procedure: BIFRONTAL CRANIOTOMY;  Surgeon: Consuella Lose, MD;  Location: [facility];  Service: Neurosurgery;  Laterality: N/A;  BIFRONTAL CRANIOTOMY Repair of Encephalocele  . INCISION AND DRAINAGE ABSCESS Left 10/07/2014   Procedure: INCISION AND DRAINAGE LEFT BREAST ABSCESS;  Surgeon: Jamesetta So, MD;  Location: [facility];  Service: General;  Laterality: Left;  . LAPAROSCOPIC OVARIAN CYSTECTOMY Right 02/05/2014   Procedure: LAPAROSCOPIC right retroperitoneal (NOT OVARIAN) CYSTECTOMY;  Surgeon: Florian Buff, MD;  Location[facility];  Service: Gynecology;  Laterality: Right;  . MASS EXCISION Left 09/07/2015   Procedure: Minor SCAR EXCISION LEFT BREAST, PLASTIC CLOSURE;  Surgeon: Cristine Polio, MD;  Location: [facility];  Service: Plastics;  Laterality: Left;  . TYMPANOSTOMY TUBE PLACEMENT Bilateral 10/2014    OB History    Gravida  1   Para  1   Term  1   Preterm      AB      Living  1  SAB      IAB      Ectopic      Multiple      Live Births  1           No Known Allergies  Social History   Socioeconomic History  . Marital status: Single    Spouse name: Not on file  . Number of children: Not on file  . Years of education: Not on file  . Highest education level: Not on file  Occupational History  . Not on file  Tobacco Use  . Smoking status: Current Every Day Smoker    Packs/day: 0.50    Years: 7.00    Pack years: 3.50    Types: Cigarettes  . Smokeless tobacco: Never Used  Vaping Use  . Vaping Use: Never used  Substance and Sexual Activity  .  Alcohol use: Yes    Comment: occasionally  . Drug use: No  . Sexual activity: Yes    Birth control/protection: None  Other Topics Concern  . Not on file  Social History Narrative  . Not on file   Social Determinants of Health   Financial Resource Strain: Not on file  Food Insecurity: Not on file  Transportation Needs: Not on file  Physical Activity: Not on file  Stress: Not on file  Social Connections: Not on file    Family History  Problem Relation Age of Onset  . Diabetes Father   . Hypertension Father   . Heart disease Daughter        heart murmur  . Cancer Maternal Grandmother        ovarian cancer  . Obesity Paternal Grandmother   . Diabetes Paternal Grandfather     Medications:       Current Outpatient Medications:  .  albuterol (PROVENTIL HFA;VENTOLIN HFA) 108 (90 Base) MCG/ACT inhaler, Inhale 2 puffs into the lungs every 6 (six) hours as needed for wheezing or shortness of breath., Disp: , Rfl:  .  ALPRAZolam (XANAX) 1 MG tablet, TAKE 1 TABLET BY MOUTH THREE TIMES DAILY, Disp: 90 tablet, Rfl: 5 .  amphetamine-dextroamphetamine (ADDERALL XR) 30 MG 24 hr capsule, Take 30 mg by mouth 2 (two) times daily., Disp: , Rfl:  .  buPROPion (WELLBUTRIN XL) 300 MG 24 hr tablet, Take 300 mg by mouth daily., Disp: , Rfl:  .  DULoxetine (CYMBALTA) 60 MG capsule, Take 60 mg by mouth daily., Disp: , Rfl:  .  gabapentin (NEURONTIN) 600 MG tablet, Take 600 mg by mouth at bedtime., Disp: , Rfl:  .  glipiZIDE (GLUCOTROL XL) 10 MG 24 hr tablet, Take 10 mg by mouth daily with breakfast., Disp: , Rfl:  .  HYDROXYZINE HCL PO, Take 50 mg by mouth at bedtime., Disp: , Rfl:  .  insulin glargine (LANTUS) 100 UNIT/ML injection, Inject 80 Units into the skin at bedtime., Disp: , Rfl:  .  oxyCODONE-acetaminophen (PERCOCET) 10-325 MG tablet, Take 1 tablet by mouth every 4 (four) hours as needed for pain., Disp: , Rfl:  .  sitaGLIPtin (JANUVIA) 50 MG tablet, Take 50 mg by mouth daily., Disp: , Rfl:   .  medroxyPROGESTERone (PROVERA) 10 MG tablet, TAKE 1 TABLET(10 MG) BY MOUTH DAILY, Disp: 30 tablet, Rfl: 3 .  NOVOLOG FLEXPEN 100 UNIT/ML FlexPen, ADMINISTER 10 TO 16 UNITS UNDER THE SKIN THREE TIMES DAILY WITH MEALS, Disp: 15 mL, Rfl: 0  Objective Blood pressure (!) 145/95, pulse (!) 121, height 5' 7.5" (1.715 m), weight (!) 365  lb (165.6 kg), last menstrual period 03/29/2020, not currently breastfeeding.  General well-developed well-nourished female no acute distress 166 kg   Pertinent ROS Per HPI  Labs or studies No new studies    Impression Diagnoses this Encounter::   ICD-10-CM   1. Amenorrhea  N91.2   2. Pelvic pain  R10.2 POCT urine pregnancy  3. Cyst of ovary, unspecified laterality  N83.209     Established relevant diagnosis(es):    Plan/Recommendations: Meds ordered this encounter  Medications  . DISCONTD: medroxyPROGESTERone (PROVERA) 10 MG tablet    Sig: Take 1 tablet (10 mg total) by mouth daily.    Dispense:  30 tablet    Refill:  0    Labs or Scans Ordered: Orders Placed This Encounter  Procedures  . POCT urine pregnancy    Management:: Progesterone challenge test  Follow up Return if symptoms worsen or fail to improve.        Face to face time:  15 minutes  Greater than 50% of the visit time was spent in counseling and coordination of care with the patient.  The summary and outline of the counseling and care coordination is summarized in the note above.   All questions were answered.

## 2020-07-12 ENCOUNTER — Other Ambulatory Visit: Payer: Self-pay | Admitting: Obstetrics & Gynecology

## 2020-07-13 ENCOUNTER — Telehealth: Payer: Self-pay | Admitting: "Endocrinology

## 2020-07-14 ENCOUNTER — Other Ambulatory Visit: Payer: Self-pay

## 2020-07-14 DIAGNOSIS — E1165 Type 2 diabetes mellitus with hyperglycemia: Secondary | ICD-10-CM

## 2020-07-14 MED ORDER — NOVOLOG FLEXPEN 100 UNIT/ML ~~LOC~~ SOPN: PEN_INJECTOR | SUBCUTANEOUS | 0 refills | Status: DC

## 2020-07-14 NOTE — Telephone Encounter (Signed)
Sent in a script to pharmacy and left voicemail for pt advising that it was sent in.

## 2020-07-14 NOTE — Telephone Encounter (Signed)
Beverlee Nims, can you see if we have a sample and call the pt?

## 2020-07-14 NOTE — Telephone Encounter (Signed)
She can get a sample pen or replacement until her next visit.

## 2020-07-14 NOTE — Telephone Encounter (Signed)
Patient requesting refill on her Novolog. She said that she know she needed an appt so she made one for next week. Please Advise.

## 2020-07-15 ENCOUNTER — Other Ambulatory Visit: Payer: Self-pay | Admitting: "Endocrinology

## 2020-07-15 DIAGNOSIS — E1165 Type 2 diabetes mellitus with hyperglycemia: Secondary | ICD-10-CM

## 2020-07-20 ENCOUNTER — Ambulatory Visit: Payer: Medicaid Other | Admitting: "Endocrinology

## 2021-05-17 ENCOUNTER — Other Ambulatory Visit (HOSPITAL_COMMUNITY): Payer: Self-pay | Admitting: Family Medicine

## 2021-05-17 DIAGNOSIS — N644 Mastodynia: Secondary | ICD-10-CM

## 2021-05-17 DIAGNOSIS — N611 Abscess of the breast and nipple: Secondary | ICD-10-CM

## 2021-05-18 ENCOUNTER — Other Ambulatory Visit (HOSPITAL_COMMUNITY)
Admission: RE | Admit: 2021-05-18 | Discharge: 2021-05-18 | Disposition: A | Discharge status: Home / Self Care | Payer: Medicaid Other | Source: Ambulatory Visit | Attending: Obstetrics & Gynecology | Admitting: Obstetrics & Gynecology

## 2021-05-18 ENCOUNTER — Ambulatory Visit (INDEPENDENT_AMBULATORY_CARE_PROVIDER_SITE_OTHER): Payer: Medicaid Other | Admitting: Obstetrics & Gynecology

## 2021-05-18 ENCOUNTER — Encounter: Payer: Self-pay | Admitting: Obstetrics & Gynecology

## 2021-05-18 ENCOUNTER — Other Ambulatory Visit: Payer: Self-pay

## 2021-05-18 VITALS — BP 140/93 | HR 107 | Ht 67.5 in | Wt 350.0 lb

## 2021-05-18 DIAGNOSIS — Z124 Encounter for screening for malignant neoplasm of cervix: Secondary | ICD-10-CM

## 2021-05-18 DIAGNOSIS — Z8742 Personal history of other diseases of the female genital tract: Secondary | ICD-10-CM | POA: Diagnosis not present

## 2021-05-18 DIAGNOSIS — N3281 Overactive bladder: Secondary | ICD-10-CM

## 2021-05-18 DIAGNOSIS — N3941 Urge incontinence: Secondary | ICD-10-CM | POA: Diagnosis not present

## 2021-05-18 MED ORDER — SILVER SULFADIAZINE 1 % EX CREA: TOPICAL_CREAM | CUTANEOUS | 11 refills | Status: DC

## 2021-05-18 MED ORDER — SOLIFENACIN SUCCINATE 10 MG PO TABS: 10.0000 mg | ORAL_TABLET | Freq: Every day | ORAL | 3 refills | Status: DC

## 2021-05-18 NOTE — Progress Notes (Addendum)
Chief Complaint  Patient presents with   has a history of ovarian cysts    Wonders if bladder has dropped      35 y.o. [id number] Patient's last menstrual period was 04/25/2021 (approximate). The current method of family planning is none.  Outpatient Encounter Medications as of 05/18/2021  Medication Sig   albuterol (PROVENTIL HFA;VENTOLIN HFA) 108 (90 Base) MCG/ACT inhaler Inhale 2 puffs into the lungs every 6 (six) hours as needed for wheezing or shortness of breath.   ALPRAZolam (XANAX) 1 MG tablet TAKE 1 TABLET BY MOUTH THREE TIMES DAILY   amphetamine-dextroamphetamine (ADDERALL XR) 30 MG 24 hr capsule Take 30 mg by mouth 2 (two) times daily.   buPROPion (WELLBUTRIN XL) 300 MG 24 hr tablet Take 300 mg by mouth as needed.   DULoxetine (CYMBALTA) 60 MG capsule Take 60 mg by mouth daily.   gabapentin (NEURONTIN) 600 MG tablet Take 600 mg by mouth at bedtime.   glipiZIDE (GLUCOTROL XL) 10 MG 24 hr tablet Take 10 mg by mouth daily with breakfast.   HYDROXYZINE HCL PO Take 50 mg by mouth at bedtime.   insulin glargine (LANTUS) 100 UNIT/ML injection Inject 80 Units into the skin at bedtime.   NOVOLOG FLEXPEN 100 UNIT/ML FlexPen ADMINISTER 10 TO 16 UNITS UNDER THE SKIN THREE TIMES DAILY WITH MEALS   oxyCODONE-acetaminophen (PERCOCET) 10-325 MG tablet Take 1 tablet by mouth every 4 (four) hours as needed for pain.   silver sulfADIAZINE (SILVADENE) 1 % cream Use to area 2-3 times daily   sitaGLIPtin (JANUVIA) 50 MG tablet Take 50 mg by mouth daily.   solifenacin (VESICARE) 10 MG tablet Take 1 tablet (10 mg total) by mouth daily.   [DISCONTINUED] medroxyPROGESTERone (PROVERA) 10 MG tablet TAKE 1 TABLET(10 MG) BY MOUTH DAILY   No facility-administered encounter medications on file as of 05/18/2021.    Subjective Pt with a 1 year history of worsening urinary frequency, urge related urinary incontinence, uses the bathroom all the time Has to know where the bathroom is all the time grocery  stores etc Generally loses urine coughing or sneezing infrequently, <20% of the time Past Medical History:  Diagnosis Date   Anxiety    Depression    Heartburn 09/01/2015   no current med.   History of anemia 01/2014   Non-insulin dependent type 2 diabetes mellitus ([place])    Obesity    PTSD (post-traumatic stress disorder)    Scar of breast 08/2015   left   Sinus headache     Past Surgical History:  Procedure Laterality Date   BREAST SURGERY     CRANIOTOMY N/A 06/30/2017   Procedure: BIFRONTAL CRANIOTOMY;  Surgeon: Consuella Lose, MD;  Location: [facility];  Service: Neurosurgery;  Laterality: N/A;  BIFRONTAL CRANIOTOMY Repair of Encephalocele   INCISION AND DRAINAGE ABSCESS Left 10/07/2014   Procedure: INCISION AND DRAINAGE LEFT BREAST ABSCESS;  Surgeon: Jamesetta So, MD;  Location: [facility];  Service: General;  Laterality: Left;   LAPAROSCOPIC OVARIAN CYSTECTOMY Right 02/05/2014   Procedure: LAPAROSCOPIC right retroperitoneal (NOT OVARIAN) CYSTECTOMY;  Surgeon: Florian Buff, MD;  Location: [facility];  Service: Gynecology;  Laterality: Right;   MASS EXCISION Left 09/07/2015   Procedure: Minor SCAR EXCISION LEFT BREAST, PLASTIC CLOSURE;  Surgeon: Cristine Polio, MD;  Location: [facility];  Service: Plastics;  Laterality: Left;   TYMPANOSTOMY TUBE PLACEMENT Bilateral 10/2014    OB History     Gravida  1  Para  1   Term  1   Preterm      AB      Living  1      SAB      IAB      Ectopic      Multiple      Live Births  1           No Known Allergies  Social History   Socioeconomic History   Marital status: Single    Spouse name: Not on file   Number of children: Not on file   Years of education: Not on file   Highest education level: Not on file  Occupational History   Not on file  Tobacco Use   Smoking status: Every Day    Packs/day: 0.50    Years: 7.00    Pack years: 3.50    Types: Cigarettes   Smokeless tobacco: Never  Vaping  Use   Vaping Use: Never used  Substance and Sexual Activity   Alcohol use: Yes    Comment: occasionally   Drug use: No   Sexual activity: Yes    Birth control/protection: None  Other Topics Concern   Not on file  Social History Narrative   Not on file   Social Determinants of Health   Financial Resource Strain: Not on file  Food Insecurity: Not on file  Transportation Needs: Not on file  Physical Activity: Not on file  Stress: Not on file  Social Connections: Not on file    Family History  Problem Relation Age of Onset   Diabetes Father    Hypertension Father    Heart disease Daughter        heart murmur   Cancer Maternal Grandmother        ovarian cancer   Obesity Paternal Grandmother    Diabetes Paternal Grandfather     Medications:       Current Outpatient Medications:    albuterol (PROVENTIL HFA;VENTOLIN HFA) 108 (90 Base) MCG/ACT inhaler, Inhale 2 puffs into the lungs every 6 (six) hours as needed for wheezing or shortness of breath., Disp: , Rfl:    ALPRAZolam (XANAX) 1 MG tablet, TAKE 1 TABLET BY MOUTH THREE TIMES DAILY, Disp: 90 tablet, Rfl: 5   amphetamine-dextroamphetamine (ADDERALL XR) 30 MG 24 hr capsule, Take 30 mg by mouth 2 (two) times daily., Disp: , Rfl:    buPROPion (WELLBUTRIN XL) 300 MG 24 hr tablet, Take 300 mg by mouth as needed., Disp: , Rfl:    DULoxetine (CYMBALTA) 60 MG capsule, Take 60 mg by mouth daily., Disp: , Rfl:    gabapentin (NEURONTIN) 600 MG tablet, Take 600 mg by mouth at bedtime., Disp: , Rfl:    glipiZIDE (GLUCOTROL XL) 10 MG 24 hr tablet, Take 10 mg by mouth daily with breakfast., Disp: , Rfl:    HYDROXYZINE HCL PO, Take 50 mg by mouth at bedtime., Disp: , Rfl:    insulin glargine (LANTUS) 100 UNIT/ML injection, Inject 80 Units into the skin at bedtime., Disp: , Rfl:    NOVOLOG FLEXPEN 100 UNIT/ML FlexPen, ADMINISTER 10 TO 16 UNITS UNDER THE SKIN THREE TIMES DAILY WITH MEALS, Disp: 15 mL, Rfl: 0   oxyCODONE-acetaminophen  (PERCOCET) 10-325 MG tablet, Take 1 tablet by mouth every 4 (four) hours as needed for pain., Disp: , Rfl:    silver sulfADIAZINE (SILVADENE) 1 % cream, Use to area 2-3 times daily, Disp: 50 g, Rfl: 11   sitaGLIPtin (JANUVIA) 50 MG  tablet, Take 50 mg by mouth daily., Disp: , Rfl:    solifenacin (VESICARE) 10 MG tablet, Take 1 tablet (10 mg total) by mouth daily., Disp: 90 tablet, Rfl: 3  Objective Blood pressure (!) 140/93, pulse (!) 107, height 5' 7.5" (1.715 m), weight (!) 350 lb (158.8 kg), last menstrual period 04/25/2021.  General [facility] female NAD Vulva:  normal appearing vulva with no masses, tenderness or lesions Vagina:  normal mucosa, no discharge, no bladder descent Cervix:  Normal no lesions Uterus:  normal size, contour, position, consistency, mobility, non-tender Adnexa: ovaries:present,  normal adnexa in size, nontender and no masses   Pertinent ROS No burning with urination, frequency or urgency No nausea, vomiting or diarrhea Nor fever chills or other constitutional symptoms   Labs or studies     Impression Diagnoses this Encounter::   ICD-10-CM   1. OAB (overactive bladder)  N32.81     2. Urge incontinence  N39.41     3. Routine cervical smear  Z12.4 Cytology - PAP( [place])    4. History of ovarian cyst  Z87.42 US PELVIS (TRANSABDOMINAL ONLY)    US PELVIS TRANSVAGINAL NON-OB (TV ONLY)      Established relevant diagnosis(es):   Plan/Recommendations: Meds ordered this encounter  Medications   solifenacin (VESICARE) 10 MG tablet    Sig: Take 1 tablet (10 mg total) by mouth daily.    Dispense:  90 tablet    Refill:  3   silver sulfADIAZINE (SILVADENE) 1 % cream    Sig: Use to area 2-3 times daily    Dispense:  50 g    Refill:  11    Labs or Scans Ordered: Orders Placed This Encounter  Procedures   US PELVIS (TRANSABDOMINAL ONLY)   US PELVIS TRANSVAGINAL NON-OB (TV ONLY)    Management:: Trial of vesicare, sent [facility]  Follow up Return in about 3 months (around 08/18/2021) for Follow up, with Dr Elonda Husky, GYN sono.    All questions were answered.

## 2021-05-20 LAB — CYTOLOGY - PAP
Chlamydia: NEGATIVE
Comment: NEGATIVE
Comment: NEGATIVE
Comment: NORMAL
Diagnosis: NEGATIVE
High risk HPV: NEGATIVE
Neisseria Gonorrhea: NEGATIVE

## 2021-06-08 ENCOUNTER — Ambulatory Visit (HOSPITAL_COMMUNITY)
Admission: RE | Admit: 2021-06-08 | Discharge: 2021-06-08 | Disposition: A | Discharge status: Home / Self Care | Payer: Medicaid Other | Source: Ambulatory Visit | Attending: Family Medicine | Admitting: Family Medicine

## 2021-06-08 ENCOUNTER — Encounter (HOSPITAL_COMMUNITY): Payer: Self-pay

## 2021-06-08 ENCOUNTER — Other Ambulatory Visit: Payer: Self-pay

## 2021-06-08 DIAGNOSIS — N611 Abscess of the breast and nipple: Secondary | ICD-10-CM

## 2021-06-08 DIAGNOSIS — N644 Mastodynia: Secondary | ICD-10-CM | POA: Diagnosis present

## 2021-11-16 ENCOUNTER — Encounter (HOSPITAL_COMMUNITY): Payer: Self-pay | Admitting: Emergency Medicine

## 2021-11-16 ENCOUNTER — Emergency Department (HOSPITAL_COMMUNITY): Payer: Medicaid Other

## 2021-11-16 ENCOUNTER — Other Ambulatory Visit: Payer: Self-pay

## 2021-11-16 ENCOUNTER — Emergency Department (HOSPITAL_COMMUNITY)
Admission: EM | Admit: 2021-11-16 | Discharge: 2021-11-16 | Disposition: A | Discharge status: Home / Self Care | Payer: Medicaid Other | Attending: Emergency Medicine | Admitting: Emergency Medicine

## 2021-11-16 DIAGNOSIS — S01511A Laceration without foreign body of lip, initial encounter: Secondary | ICD-10-CM | POA: Diagnosis not present

## 2021-11-16 DIAGNOSIS — Z7951 Long term (current) use of inhaled steroids: Secondary | ICD-10-CM | POA: Diagnosis not present

## 2021-11-16 DIAGNOSIS — Z23 Encounter for immunization: Secondary | ICD-10-CM | POA: Insufficient documentation

## 2021-11-16 DIAGNOSIS — Z7984 Long term (current) use of oral hypoglycemic drugs: Secondary | ICD-10-CM | POA: Diagnosis not present

## 2021-11-16 DIAGNOSIS — E119 Type 2 diabetes mellitus without complications: Secondary | ICD-10-CM | POA: Diagnosis not present

## 2021-11-16 DIAGNOSIS — S0502XA Injury of conjunctiva and corneal abrasion without foreign body, left eye, initial encounter: Secondary | ICD-10-CM | POA: Insufficient documentation

## 2021-11-16 DIAGNOSIS — W19XXXA Unspecified fall, initial encounter: Secondary | ICD-10-CM | POA: Diagnosis not present

## 2021-11-16 DIAGNOSIS — Z794 Long term (current) use of insulin: Secondary | ICD-10-CM | POA: Diagnosis not present

## 2021-11-16 DIAGNOSIS — R42 Dizziness and giddiness: Secondary | ICD-10-CM | POA: Diagnosis not present

## 2021-11-16 DIAGNOSIS — S00501A Unspecified superficial injury of lip, initial encounter: Secondary | ICD-10-CM | POA: Diagnosis present

## 2021-11-16 DIAGNOSIS — Y9389 Activity, other specified: Secondary | ICD-10-CM | POA: Insufficient documentation

## 2021-11-16 HISTORY — DX: Cerebrospinal fluid leak, unspecified: G96.00

## 2021-11-16 LAB — BASIC METABOLIC PANEL
Anion gap: 8 (ref 5–15)
BUN: 8 mg/dL (ref 6–20)
CO2: 26 mmol/L (ref 22–32)
Calcium: 8.6 mg/dL — ABNORMAL LOW (ref 8.9–10.3)
Chloride: 98 mmol/L (ref 98–111)
Creatinine, Ser: 0.67 mg/dL (ref 0.44–1.00)
GFR, Estimated: >60 mL/min (ref >60)
Glucose, Bld: 323 mg/dL — ABNORMAL HIGH (ref 70–99)
Potassium: 3.7 mmol/L (ref 3.5–5.1)
Sodium: 132 mmol/L — ABNORMAL LOW (ref 135–145)

## 2021-11-16 LAB — URINALYSIS, ROUTINE W REFLEX MICROSCOPIC
Bilirubin Urine: NEGATIVE
Glucose, UA: >=500 mg/dL — AB
Ketones, ur: 15 mg/dL — AB
Leukocytes,Ua: NEGATIVE
Nitrite: NEGATIVE
Protein, ur: 100 mg/dL — AB
Specific Gravity, Urine: >1.03 — ABNORMAL HIGH (ref 1.005–1.030)
pH: 6 (ref 5.0–8.0)

## 2021-11-16 LAB — CBC
HCT: 42 % (ref 36.0–46.0)
Hemoglobin: 13.6 g/dL (ref 12.0–15.0)
MCH: 27.2 pg (ref 26.0–34.0)
MCHC: 32.4 g/dL (ref 30.0–36.0)
MCV: 84 fL (ref 80.0–100.0)
Platelets: 256 10*3/uL (ref 150–400)
RBC: 5 MIL/uL (ref 3.87–5.11)
RDW: 12 % (ref 11.5–15.5)
WBC: 13.1 10*3/uL — ABNORMAL HIGH (ref 4.0–10.5)
nRBC: 0 % (ref 0.0–0.2)

## 2021-11-16 LAB — URINALYSIS, MICROSCOPIC (REFLEX)

## 2021-11-16 LAB — CBG MONITORING, ED: Glucose-Capillary: 338 mg/dL — ABNORMAL HIGH (ref 70–99)

## 2021-11-16 MED ORDER — OXYCODONE-ACETAMINOPHEN 5-325 MG PO TABS
1.0000 | ORAL_TABLET | Freq: Once | ORAL | Status: AC
  Administered 2021-11-16: 1 via ORAL
  Filled 2021-11-16: qty 1

## 2021-11-16 MED ORDER — DOXYCYCLINE HYCLATE 100 MG PO CAPS: 100.0000 mg | ORAL_CAPSULE | Freq: Two times a day (BID) | ORAL | 0 refills | Status: AC

## 2021-11-16 MED ORDER — LIDOCAINE HCL (PF) 1 % IJ SOLN
5.0000 mL | Freq: Once | INTRAMUSCULAR | Status: AC
Start: 2021-11-16 — End: 2021-11-16
  Administered 2021-11-16: 5 mL
  Filled 2021-11-16: qty 30

## 2021-11-16 MED ORDER — TETANUS-DIPHTH-ACELL PERTUSSIS 5-2.5-18.5 LF-MCG/0.5 IM SUSY
0.5000 mL | PREFILLED_SYRINGE | Freq: Once | INTRAMUSCULAR | Status: AC
Start: 2021-11-16 — End: 2021-11-16
  Administered 2021-11-16: 0.5 mL via INTRAMUSCULAR
  Filled 2021-11-16: qty 0.5

## 2021-11-16 MED ORDER — DOXYCYCLINE HYCLATE 100 MG PO CAPS: 100.0000 mg | ORAL_CAPSULE | Freq: Two times a day (BID) | ORAL | 0 refills | Status: DC

## 2021-11-16 NOTE — ED Triage Notes (Signed)
Pt states has recently had upper respiratory infection, has inner ear issues, felt dizzy and fell this morning sustaining laceration to her lower lip, bleeding controlled on arrival.

## 2021-11-16 NOTE — Discharge Instructions (Signed)
You received 3 sutures in your lip, of starting on antibiotics please take as prescribed, for pain you may use over-the-counter pain medication as needed, the sutures will dissolve on their own, if they fall out do not worry.   Please follow-up with PCP as needed.  Come back to the emergency department if you develop chest pain, shortness of breath, severe abdominal pain, uncontrolled nausea, vomiting, diarrhea.

## 2021-11-16 NOTE — ED Provider Notes (Signed)
[facility] EMERGENCY DEPARTMENT Provider Note   CSN: [id number] Arrival date & time: 11/16/21  0818     History  Chief Complaint  Patient presents with   Lip Laceration    fall    Judy Nelson is a 36 y.o. female.  HPI  Patient with medical history including diabetes, frontal encephalocele status post craniotomy 2018 as well as 2019 presents with complaints of a fall.  Patient states that last night she was watching TV went up to get some food and as she was walking back she felt dizzy and blacked out.  She states that she landed on the floor and hit her face, she did admit to losing consciousness only for few seconds, she is able get herself up off the floor she noted she had profuse bleeding from her lip was able to stop the bleeding with direct pressure.  She states other than having bleeding lip, no other complaints, she denies having a headache, change in vision, paresthesias or weakness upper lower extremities, she denies prior to falling  have any chest pain shortness of breath, she states that this happens to her often,  if she moves too quickly she will get dizzy and passed out.  Patient not had anything for pain at this time, she is not immunocompromise, does not really last time she had tetanus shot.  After reviewing patient's chart had craniotomy performed back in 2018 by Dr. Kathyrn Sheriff unfortunately issues did not fully resolve and went back for additional surgery where she received a graft in 2019 she is not seen ENT or neurosurgery since then.  Home Medications Prior to Admission medications   Medication Sig Start Date End Date Taking? Authorizing Provider  albuterol (PROVENTIL HFA;VENTOLIN HFA) 108 (90 Base) MCG/ACT inhaler Inhale 2 puffs into the lungs every 6 (six) hours as needed for wheezing or shortness of breath.    [provider]  ALPRAZolam Duanne Moron) 1 MG tablet TAKE 1 TABLET BY MOUTH THREE TIMES DAILY 07/26/16   Florian Buff, MD   amphetamine-dextroamphetamine (ADDERALL XR) 30 MG 24 hr capsule Take 30 mg by mouth 2 (two) times daily.    [provider]  buPROPion (WELLBUTRIN XL) 300 MG 24 hr tablet Take 300 mg by mouth as needed.    [provider]  doxycycline (VIBRAMYCIN) 100 MG capsule Take 1 capsule (100 mg total) by mouth 2 (two) times daily for 7 days. 11/16/21 11/23/21  Marcello Fennel, PA-C  DULoxetine (CYMBALTA) 60 MG capsule Take 60 mg by mouth daily.    [provider]  gabapentin (NEURONTIN) 600 MG tablet Take 600 mg by mouth at bedtime.    [provider]  glipiZIDE (GLUCOTROL XL) 10 MG 24 hr tablet Take 10 mg by mouth daily with breakfast.    [provider]  HYDROXYZINE HCL PO Take 50 mg by mouth at bedtime.    [provider]  insulin glargine (LANTUS) 100 UNIT/ML injection Inject 80 Units into the skin at bedtime.    [provider]  NOVOLOG FLEXPEN 100 UNIT/ML FlexPen ADMINISTER 10 TO 16 UNITS UNDER THE SKIN THREE TIMES DAILY WITH MEALS 07/15/20   Nida, Marella Chimes, MD  oxyCODONE-acetaminophen (PERCOCET) 10-325 MG tablet Take 1 tablet by mouth every 4 (four) hours as needed for pain.    [provider]  silver sulfADIAZINE (SILVADENE) 1 % cream Use to area 2-3 times daily 05/18/21   Florian Buff, MD  sitaGLIPtin (JANUVIA) 50 MG tablet Take 50 mg  by mouth daily.    [provider]  solifenacin (VESICARE) 10 MG tablet Take 1 tablet (10 mg total) by mouth daily. 05/18/21   Florian Buff, MD      Allergies    Penicillin g    Review of Systems   Review of Systems  Constitutional:  Negative for chills and fever.  HENT:  Positive for facial swelling.   Respiratory:  Negative for shortness of breath.   Cardiovascular:  Negative for chest pain.  Gastrointestinal:  Negative for abdominal pain.  Neurological:  Negative for dizziness, weakness, numbness and headaches.   Physical Exam Updated Vital Signs BP (!) 143/87 (BP  Location: Left Arm)    Pulse (!) 106    Temp 98.1 F (36.7 C) (Oral)    Resp 20    Ht 5\' 7"  (1.702 m)    Wt (!) 165.6 kg    LMP 11/16/2021    SpO2 99%    BMI 57.17 kg/m  Physical Exam Vitals and nursing note reviewed.  Constitutional:      General: She is not in acute distress.    Appearance: She is not ill-appearing.  HENT:     Head: Normocephalic and atraumatic.     Comments: Has a noted abrasion below her left eye, hemodynamically stable, she also has a laceration on left aspect of her bottom lip, does not cross the vermilion border, laceration is vertical nature on the external part of the lip and the internal aspect of the lip there is a V-shaped laceration, that is gaping, that appears the wound goes through and through please see picture for full detail    Nose: No congestion.     Mouth/Throat:     Mouth: Mucous membranes are moist.     Pharynx: Oropharynx is clear. No oropharyngeal exudate or posterior oropharyngeal erythema.     Comments: Other than the lip laceration mentioned above no other oral trauma present Eyes:     Extraocular Movements: Extraocular movements intact.     Conjunctiva/sclera: Conjunctivae normal.     Pupils: Pupils are equal, round, and reactive to light.  Cardiovascular:     Rate and Rhythm: Normal rate and regular rhythm.     Pulses: Normal pulses.     Heart sounds: No murmur heard.   No friction rub. No gallop.  Pulmonary:     Effort: No respiratory distress.     Breath sounds: No wheezing, rhonchi or rales.  Chest:     Chest wall: No tenderness.  Abdominal:     Palpations: Abdomen is soft.     Tenderness: There is no abdominal tenderness. There is no right CVA tenderness or left CVA tenderness.  Musculoskeletal:     Cervical back: No tenderness.     Right lower leg: No edema.     Left lower leg: No edema.  Skin:    General: Skin is warm and dry.  Neurological:     Mental Status: She is alert.     Comments: Cranial nerves II through XII  grossly intact no difficult word finding no slurring of her words able follow two-step commands no unilateral weakness present.  Psychiatric:        Mood and Affect: Mood normal.         ED Results / Procedures / Treatments   Labs (all labs ordered are listed, but only abnormal results are displayed) Labs Reviewed  BASIC METABOLIC PANEL - Abnormal; Notable for the following components:  Result Value   Sodium 132 (*)    Glucose, Bld 323 (*)    Calcium 8.6 (*)    All other components within normal limits  CBC - Abnormal; Notable for the following components:   WBC 13.1 (*)    All other components within normal limits  CBG MONITORING, ED - Abnormal; Notable for the following components:   Glucose-Capillary 338 (*)    All other components within normal limits  URINALYSIS, ROUTINE W REFLEX MICROSCOPIC  CBG MONITORING, ED  POC URINE PREG, ED    EKG None  Radiology CT Head Wo Contrast  Result Date: 11/16/2021 CLINICAL DATA:  36 year old female status post syncope, falling forward. Lip laceration. History of repaired frontal encephalocele. EXAM: CT HEAD WITHOUT CONTRAST TECHNIQUE: Contiguous axial images were obtained from the base of the skull through the vertex without intravenous contrast. RADIATION DOSE REDUCTION: This exam was performed according to the departmental dose-optimization program which includes automated exposure control, adjustment of the mA and/or kV according to patient size and/or use of iterative reconstruction technique. COMPARISON:  Face CT reported separately.  Brain MRI 08/28/2017. FINDINGS: Brain: Stable cerebral volume since 2018. No midline shift, ventriculomegaly, mass effect, evidence of mass lesion, intracranial hemorrhage or evidence of cortically based acute infarction. Chronic inferior frontal gyrus encephalomalacia greater on the right. Elsewhere gray-white matter differentiation is within normal limits throughout the brain. Vascular: No suspicious  intracranial vascular hyperdensity. Skull: Previous bilateral frontal bone, frontal sinus surgery with cranioplasty. Hyperostosis of the calvarium. No acute osseous abnormality identified. Previous feel chronic partially empty sella. Sinuses/Orbits: Previous right side paranasal sinus surgery. Previous bilateral frontal sinus surgery. Other paranasal sinuses are well aerated. Tympanic cavities are clear. Chronic left mastoid effusion appears stable since 2018. Other: Chronic postoperative changes to the anterior scalp. No acute orbit or scalp soft tissue injury identified. IMPRESSION: 1. No acute intracranial abnormality. No acute traumatic injury identified. 2. Postoperative changes to the anterior skull and chronic inferior frontal gyrus encephalomalacia, corresponding to previous frontal encephalocele repair. 3. Chronic left mastoid effusion. Electronically Signed   By: Genevie Ann M.D.   On: 11/16/2021 11:47   CT Maxillofacial Wo Contrast  Result Date: 11/16/2021 CLINICAL DATA:  36 year old female status post syncope, falling forward. Lip laceration. History of repaired frontal encephalocele. EXAM: CT MAXILLOFACIAL WITHOUT CONTRAST TECHNIQUE: Multidetector CT imaging of the maxillofacial structures was performed. Multiplanar CT image reconstructions were also generated. RADIATION DOSE REDUCTION: This exam was performed according to the departmental dose-optimization program which includes automated exposure control, adjustment of the mA and/or kV according to patient size and/or use of iterative reconstruction technique. COMPARISON:  Head CT today.  Preoperative face CT 06/23/2017. FINDINGS: Osseous: Mandible intact and normally located. Left mandible bicuspid dental extraction since 2018. Right maxillary bicuspid extraction also. No acute dental finding identified. Previous bilateral frontal craniotomy/cranioplasty. No acute osseous abnormality identified. Orbits: Intact orbital walls. Orbits soft tissues  appears symmetric and normal. Sinuses: Postoperative changes to the right paranasal and frontal sinuses with evidence of resolved right ethmoidal encephalocele and relatively well aerated paranasal sinuses now. Mild mucosal thickening or small retention cyst in the right maxillary alveolar recess. Tympanic cavities remain clear. Mild left mastoid effusion is stable since 2018. Soft tissues: Superficial soft tissue swelling at the lower lip. No definite soft tissue gas. No other superficial soft tissue injury identified. Negative visible noncontrast deep soft tissue spaces of the face and neck. Limited intracranial: Reported separately today. IMPRESSION: 1. Superficial soft tissue swelling at the lower  lip. No other acute traumatic injury identified in the Face. 2. Postoperative changes from repaired right frontoethmoidal encephalocele. Well aerated paranasal sinuses now. Electronically Signed   By: Genevie Ann M.D.   On: 11/16/2021 11:52    Procedures .Marland KitchenLaceration Repair  Date/Time: 11/16/2021 11:36 AM Performed by: Marcello Fennel, PA-C Authorized by: Marcello Fennel, PA-C   Consent:    Consent obtained:  Verbal   Consent given by:  Patient   Risks discussed:  Infection, pain, retained foreign body, need for additional repair, poor cosmetic result, tendon damage, vascular damage, poor wound healing and nerve damage   Alternatives discussed:  No treatment, delayed treatment, observation and referral Universal protocol:    Patient identity confirmed:  Verbally with patient Anesthesia:    Anesthesia method:  Local infiltration   Local anesthetic:  Lidocaine 1% w/o epi Laceration details:    Location:  Lip   Lip location:  Lower exterior lip and lower interior lip   Length (cm):  4   Depth (mm):  3 Pre-procedure details:    Preparation:  Patient was prepped and draped in usual sterile fashion and imaging obtained to evaluate for foreign bodies Exploration:    Limited defect created (wound  extended): no     Hemostasis achieved with:  Direct pressure   Imaging outcome: foreign body not noted     Wound exploration: wound explored through full range of motion and entire depth of wound visualized     Contaminated: no   Treatment:    Area cleansed with:  Saline   Amount of cleaning:  Extensive   Irrigation solution:  Sterile saline   Visualized foreign bodies/material removed: no     Debridement:  None Skin repair:    Repair method:  Sutures   Suture size:  5-0   Suture material:  Fast-absorbing gut   Suture technique:  Simple interrupted   Number of sutures:  3 Approximation:    Approximation:  Loose Repair type:    Repair type:  Intermediate Post-procedure details:    Dressing:  Open (no dressing)   Procedure completion:  Tolerated well, no immediate complications    Medications Ordered in ED Medications  lidocaine (PF) (XYLOCAINE) 1 % injection 5 mL (5 mLs Infiltration Given by Other 11/16/21 0958)  Tdap (BOOSTRIX) injection 0.5 mL (0.5 mLs Intramuscular Given 11/16/21 0959)  oxyCODONE-acetaminophen (PERCOCET/ROXICET) 5-325 MG per tablet 1 tablet (1 tablet Oral Given 11/16/21 4097)    ED Course/ Medical Decision Making/ A&P                           Medical Decision Making Amount and/or Complexity of Data Reviewed Labs: ordered. Radiology: ordered.  Risk Prescription drug management.   This patient presents to the ED for concern of syncopal episode, this involves an extensive number of treatment options, and is a complaint that carries with it a high risk of complications and morbidity.  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  The pertinent results include: CBC shows slight leukocytosis of 13.1, BMP shows sodium 132 glucose 323 calcium 8.6   Imaging Studies  ordered:  I ordered imaging studies including CT head and maxillofacial I independently visualized and interpreted imaging which negative for acute findings showed I agree with the radiologist interpretation   Cardiac Monitoring:  The patient was maintained on a cardiac monitor.  I personally viewed and interpreted the cardiac monitored which showed an underlying rhythm of: EKG sinus tach without signs of ischemia   Medicines ordered and prescription drug management:  I ordered medication including oxycodone, Tdap for pain and update on tetanus shot I have reviewed the patients home medicines and have made adjustments as needed   Reevaluation:  Patient a noted laceration on her lip, went through and through, trauma did happen 10 hours ago but since the wound is gaping would recommend closer for improved wound healing risk events were discussed patient agreed in this plan.  Tolerated procedure well we will continue to monitor  Rule out low suspicion for CVA or intracranial head bleed as patient denies change in vision, paresthesias or weakness to upper lower extremities, no neuro deficits noted on exam, CT head did not reveal any acute findings.  Low suspicion for ACS or arrhythmias as patient denies chest pain, shortness of breath, no hypoperfusion or fluid overload on exam, EKG sinus without signs of ischemia.  Low suspicion for DKA or HSS as she does not appear dry on my exam, glucose is not significant elevated, CO2 is on anion gap within normal limits.  Low suspicion for seizure as she denies tongue biting or become incontinent, has no history of this, no postictal state presentation atypical etiology. low suspicion for systemic infection as patient is nontoxic-appearing, vital  signs reassuring, no obvious source infection noted on exam.       Dispostion and problem list  After consideration of the diagnostic results and the patients response to treatment, I feel that the patent would benefit from antibiotics, follow-up with PCP as needed.  Lip laceration-patient received 3 sutures, will start on antibiotics since it was a puncture wound recommend basic wound care follow-up PCP as needed Syncopal episode-unclear etiology but consistent with vasovagal follow-up with PCP as needed.  Strict return precautions.            Final Clinical Impression(s) / ED Diagnoses Final diagnoses:  Fall, initial encounter  Lip laceration, initial encounter    Rx / DC Orders ED Discharge Orders          Ordered    doxycycline (VIBRAMYCIN) 100 MG capsule  2 times daily,   Status:  Discontinued        11/16/21 1225    doxycycline (VIBRAMYCIN) 100 MG capsule  2 times daily        11/16/21 1225              Aron Baba 11/16/21 1227    Godfrey Pick, MD 11/17/21 [phone]

## 2021-11-16 NOTE — ED Notes (Signed)
Patient ambulatory to restroom.  Advised patient we needed urine specimen. Patient came back to room and stated "no urine cups and I had to go to bathroom bad". I checked and all restrooms had urine cups.  Advised patient that we still needed urine specimen.

## 2021-11-16 NOTE — ED Triage Notes (Signed)
Pt states she became dizzy this am and fell. Stated she did pass out. Bleeding controlled to left side of lower lip. A/o. Ambulated to room with steady gait prior to finding out pt had syncopal episode. Hx of CSF leaks with recent surgery.

## 2022-02-08 ENCOUNTER — Encounter: Payer: Self-pay | Admitting: Obstetrics & Gynecology

## 2022-02-09 LAB — COMPREHENSIVE METABOLIC PANEL
Albumin: 3.9 (ref 3.5–5.0)
Calcium: 9.7 (ref 8.7–10.7)
Globulin: 3.6

## 2022-02-09 LAB — BASIC METABOLIC PANEL
BUN: 8 (ref 4–21)
CO2: 22 (ref 13–22)
Chloride: 96 — AB (ref 99–108)
Creatinine: 0.8 (ref 0.5–1.1)
Glucose: 322
Potassium: 4.6 mEq/L (ref 3.5–5.1)
Sodium: 135 — AB (ref 137–147)

## 2022-02-09 LAB — TSH: TSH: 1.41 (ref 0.41–5.90)

## 2022-02-09 LAB — HEMOGLOBIN A1C: Hemoglobin A1C: 11

## 2022-02-09 LAB — LIPID PANEL
Cholesterol: 217 — AB (ref 0–200)
HDL: 39 (ref 35–70)
LDL Cholesterol: 112
Triglycerides: 385 — AB (ref 40–160)

## 2022-02-09 LAB — HEPATIC FUNCTION PANEL
ALT: 14 U/L (ref 7–35)
AST: 15 (ref 13–35)
Alkaline Phosphatase: 148 — AB (ref 25–125)
Bilirubin, Total: 0.2

## 2022-02-10 ENCOUNTER — Telehealth: Payer: Self-pay | Admitting: "Endocrinology

## 2022-02-10 ENCOUNTER — Ambulatory Visit: Payer: Medicaid Other | Admitting: Cardiology

## 2022-02-10 NOTE — Telephone Encounter (Signed)
Pt is calling and requesting to set up a follow up. She has not been seen since 2021. Does she need anything before sch an appt. She said she recently had a a1c done at [facility].  ?

## 2022-02-11 ENCOUNTER — Encounter: Payer: Self-pay | Admitting: Cardiology

## 2022-02-18 ENCOUNTER — Other Ambulatory Visit: Payer: Self-pay | Admitting: Obstetrics & Gynecology

## 2022-02-18 DIAGNOSIS — R102 Pelvic and perineal pain: Secondary | ICD-10-CM

## 2022-02-18 DIAGNOSIS — N83201 Unspecified ovarian cyst, right side: Secondary | ICD-10-CM

## 2022-02-21 ENCOUNTER — Ambulatory Visit (INDEPENDENT_AMBULATORY_CARE_PROVIDER_SITE_OTHER): Payer: Medicaid Other

## 2022-02-21 ENCOUNTER — Ambulatory Visit: Payer: Medicaid Other | Admitting: Obstetrics & Gynecology

## 2022-02-21 ENCOUNTER — Encounter: Payer: Self-pay | Admitting: Obstetrics & Gynecology

## 2022-02-21 VITALS — BP 145/106 | HR 117 | Wt 340.0 lb

## 2022-02-21 DIAGNOSIS — R1032 Left lower quadrant pain: Secondary | ICD-10-CM

## 2022-02-21 DIAGNOSIS — R102 Pelvic and perineal pain: Secondary | ICD-10-CM | POA: Diagnosis not present

## 2022-02-21 NOTE — Progress Notes (Signed)
PELVIS TA/TV: homogeneous axial positioned uterus,WNL,EEC 5.7 mm,elongated ovaries with multiple small peripheral follicles,left paraovarian simple cyst 4.1 x 3.4 x 3.6 cm,ovaries appear mobile,no free fluid,no pain during ultrasound  ? ?Chaperone Peggy  ?

## 2022-02-21 NOTE — Progress Notes (Signed)
Follow up appointment for results: ?GYN sonogram ? ?Chief Complaint  ?Patient presents with  ? GYN ultrasound  ? ? ?Blood pressure (!) 145/106, pulse (!) 117, weight (!) 340 lb (154.2 kg), last menstrual period 01/22/2022. ? ?US PELVIC COMPLETE WITH TRANSVAGINAL ? ?Result Date: 02/21/2022 ?Images from the original result were not included.  ..an [employer] [facility] [employer] Diplomatic Services operational officer) accredited practice Center for [facility] @ [address] [place] [address] Ordering Provider: Florian Buff, MD                                                                                                             GYNECOLOGIC SONOGRAM Judy Nelson is a 36 y.o. G1P1001 LMP 01/22/2022,She is here for a pelvic sonogram for pelvic pain. Uterus                      5 x 4.6 x 4.7 cm, Total uterine volume 57 cc,: homogeneous axial positioned uterus,WNL Endometrium          5.7 mm, symmetrical, WNL Right ovary             4.4 x 1.7 x 1.9 cm, elongated ovary with multiple small peripheral follicles Left ovary                4.9 x 1.5 x 1.9 cm, elongated ovary with multiple small peripheral follicles,left paraovarian simple cyst 4.1 x 3.4 x 3.6 cm No free fluid Technician Comments: PELVIS TA/TV: homogeneous axial positioned uterus,WNL,EEC 5.7 mm,elongated ovaries with multiple small peripheral follicles,left paraovarian simple cyst 4.1 x 3.4 x 3.6 cm,ovaries appear mobile,no free fluid,no pain during ultrasound Chaperone [address] Heide Guile 02/21/2022 3:56 PM Clinical Impression and recommendations: I have reviewed the sonogram results above, combined with the patient's current clinical course, below are my impressions and any appropriate recommendations for management based on the sonographic findings. Uterus normal size shape and contour Endometrium 5.4 mm Ovaries: normal size shape and morphology, tends toward PCOS but has regular periods, 4 cm left paraovarian cyst, simple Florian Buff 02/21/2022  4:28 PM   ? ?Pain has resolved, possibly a ruptured hemorrhagic corpus luteum, pain lasted 4 days, not associted issues ? ?MEDS ordered this encounter: ?No orders of the defined types were placed in this encounter. ? ? ?Orders for this encounter: ?No orders of the defined types were placed in this encounter. ? ? ?Impression + Management Plan ?  ICD-10-CM   ?1. LLQ pain, resolved, normal sonogram 4 cm simple paraovarian cyst  R10.32   ?  ? ? ?Follow Up: ?Return if symptoms worsen or fail to improve. ? ? ? ? All questions were answered. ? ?Past Medical History:  ?Diagnosis Date  ? Anxiety   ? CSF leak   ? Depression   ? Heartburn 09/01/2015  ? no current med.  ? History of anemia 01/2014  ? Non-insulin dependent type 2 diabetes mellitus ([place])   ? Obesity   ? PTSD (post-traumatic stress disorder)   ?  Scar of breast 08/2015  ? left  ? Sinus headache   ? ? ?Past Surgical History:  ?Procedure Laterality Date  ? BREAST SURGERY Bilateral   ? bilateral breast surgery to remove cyst at [facility] 2017  ? CRANIOTOMY N/A 06/30/2017  ? Procedure: BIFRONTAL CRANIOTOMY;  Surgeon: Consuella Lose, MD;  Location: [place];  Service: Neurosurgery;  Laterality: N/A;  BIFRONTAL CRANIOTOMY Repair of Encephalocele  ? INCISION AND DRAINAGE ABSCESS Left 10/07/2014  ? Procedure: INCISION AND DRAINAGE LEFT BREAST ABSCESS;  Surgeon: Jamesetta So, MD;  Location: [facility];  Service: General;  Laterality: Left;  ? LAPAROSCOPIC OVARIAN CYSTECTOMY Right 02/05/2014  ? Procedure: LAPAROSCOPIC right retroperitoneal (NOT OVARIAN) CYSTECTOMY;  Surgeon: Florian Buff, MD;  Location: [facility];  Service: Gynecology;  Laterality: Right;  ? MASS EXCISION Left 09/07/2015  ? Procedure: Minor SCAR EXCISION LEFT BREAST, PLASTIC CLOSURE;  Surgeon: Cristine Polio, MD;  Location: [place];  Service: Plastics;  Laterality: Left;  ? TYMPANOSTOMY TUBE PLACEMENT Bilateral 10/17/2014  ? ? ?OB History   ? ? Gravida  ?1  ? Para  ?1  ? Term  ?1  ? Preterm  ?    ? AB  ?   ? Living  ?1  ?  ? ? SAB  ?   ? IAB  ?   ? Ectopic  ?   ? Multiple  ?   ? Live Births  ?1  ?   ?  ?  ? ? ?Allergies  ?Allergen Reactions  ? Penicillin G   ? ? ?Social History  ? ?Socioeconomic History  ? Marital status: Single  ?  Spouse name: Not on file  ? Number of children: Not on file  ? Years of education: Not on file  ? Highest education level: Not on file  ?Occupational History  ? Not on file  ?Tobacco Use  ? Smoking status: Every Day  ?  Packs/day: 0.50  ?  Years: 7.00  ?  Pack years: 3.50  ?  Types: Cigarettes  ? Smokeless tobacco: Never  ?Vaping Use  ? Vaping Use: Never used  ?Substance and Sexual Activity  ? Alcohol use: Yes  ?  Comment: occasionally  ? Drug use: No  ? Sexual activity: Yes  ?  Birth control/protection: None  ?Other Topics Concern  ? Not on file  ?Social History Narrative  ? Not on file  ? ?Social Determinants of Health  ? ?Financial Resource Strain: Not on file  ?Food Insecurity: Not on file  ?Transportation Needs: Not on file  ?Physical Activity: Not on file  ?Stress: Not on file  ?Social Connections: Not on file  ? ? ?Family History  ?Problem Relation Age of Onset  ? Diabetes Father   ? Hypertension Father   ? Heart disease Daughter   ?     heart murmur  ? Cancer Maternal Grandmother   ?     ovarian cancer  ? Obesity Paternal Grandmother   ? Diabetes Paternal Grandfather   ? ? ?

## 2022-03-01 ENCOUNTER — Ambulatory Visit (INDEPENDENT_AMBULATORY_CARE_PROVIDER_SITE_OTHER): Payer: Medicaid Other | Admitting: "Endocrinology

## 2022-03-01 ENCOUNTER — Encounter: Payer: Self-pay | Admitting: "Endocrinology

## 2022-03-01 VITALS — BP 136/88 | HR 88 | Ht 67.5 in | Wt 348.0 lb

## 2022-03-01 DIAGNOSIS — E782 Mixed hyperlipidemia: Secondary | ICD-10-CM | POA: Diagnosis not present

## 2022-03-01 DIAGNOSIS — Z91199 Patient's noncompliance with other medical treatment and regimen due to unspecified reason: Secondary | ICD-10-CM | POA: Insufficient documentation

## 2022-03-01 DIAGNOSIS — E1165 Type 2 diabetes mellitus with hyperglycemia: Secondary | ICD-10-CM | POA: Diagnosis not present

## 2022-03-01 MED ORDER — NOVOLOG FLEXPEN 100 UNIT/ML ~~LOC~~ SOPN: 10.0000 [IU] | PEN_INJECTOR | Freq: Three times a day (TID) | SUBCUTANEOUS | 2 refills | Status: DC

## 2022-03-01 NOTE — Patient Instructions (Signed)

## 2022-03-01 NOTE — Progress Notes (Signed)
? ?                                         ?     03/01/2022, 3:03 PM ? ?Endocrinology follow-up note ? ? ?Subjective:  ? ? Patient ID: Judy Nelson, female    DOB: 1985/12/03.  ?Judy Nelson is being seen in follow-up after she was seen in consultation for management of currently uncontrolled symptomatic diabetes requested by  [facility]. ? ?Patient was seen in April 2021 in consult for type 2 diabetes.  She did not return for follow-up. ? ?Past Medical History:  ?Diagnosis Date  ? Anxiety   ? CSF leak   ? Depression   ? Heartburn 09/01/2015  ? no current med.  ? History of anemia 01/2014  ? Non-insulin dependent type 2 diabetes mellitus ([place])   ? Obesity   ? PTSD (post-traumatic stress disorder)   ? Scar of breast 08/2015  ? left  ? Sinus headache   ? ? ?Past Surgical History:  ?Procedure Laterality Date  ? BREAST SURGERY Bilateral   ? bilateral breast surgery to remove cyst at [facility] 2017  ? CRANIOTOMY N/A 06/30/2017  ? Procedure: BIFRONTAL CRANIOTOMY;  Surgeon: Consuella Lose, MD;  Location: [facility];  Service: Neurosurgery;  Laterality: N/A;  BIFRONTAL CRANIOTOMY Repair of Encephalocele  ? INCISION AND DRAINAGE ABSCESS Left 10/07/2014  ? Procedure: INCISION AND DRAINAGE LEFT BREAST ABSCESS;  Surgeon: Jamesetta So, MD;  Location: [facility];  Service: General;  Laterality: Left;  ? LAPAROSCOPIC OVARIAN CYSTECTOMY Right 02/05/2014  ? Procedure: LAPAROSCOPIC right retroperitoneal (NOT OVARIAN) CYSTECTOMY;  Surgeon: Florian Buff, MD;  Location: [facility];  Service: Gynecology;  Laterality: Right;  ? MASS EXCISION Left 09/07/2015  ? Procedure: Minor SCAR EXCISION LEFT BREAST, PLASTIC CLOSURE;  Surgeon: Cristine Polio, MD;  Location: [place];  Service: Plastics;  Laterality: Left;  ? TYMPANOSTOMY TUBE PLACEMENT Bilateral 10/17/2014  ? ? ?Social History  ? ?Socioeconomic History  ? Marital status: Single  ?  Spouse name: Not on file  ? Number of children: Not on  file  ? Years of education: Not on file  ? Highest education level: Not on file  ?Occupational History  ? Not on file  ?Tobacco Use  ? Smoking status: Every Day  ?  Packs/day: 0.50  ?  Years: 7.00  ?  Pack years: 3.50  ?  Types: Cigarettes  ? Smokeless tobacco: Never  ?Vaping Use  ? Vaping Use: Never used  ?Substance and Sexual Activity  ? Alcohol use: Yes  ?  Comment: occasionally  ? Drug use: No  ? Sexual activity: Yes  ?  Birth control/protection: None  ?Other Topics Concern  ? Not on file  ?Social History Narrative  ? Not on file  ? ?Social Determinants of Health  ? ?Financial Resource Strain: Not on file  ?Food Insecurity: Not on file  ?Transportation Needs: Not on file  ?Physical Activity: Not on file  ?Stress: Not on file  ?Social Connections: Not on file  ? ? ?Family History  ?Problem Relation Age of Onset  ? Diabetes Father   ? Hypertension Father   ? Heart disease Daughter   ?     heart murmur  ? Cancer Maternal Grandmother   ?     ovarian cancer  ? Obesity Paternal Grandmother   ? Diabetes Paternal  Grandfather   ? ? ?Outpatient Encounter Medications as of 03/01/2022  ?Medication Sig  ? albuterol (PROVENTIL HFA;VENTOLIN HFA) 108 (90 Base) MCG/ACT inhaler Inhale 2 puffs into the lungs every 6 (six) hours as needed for wheezing or shortness of breath.  ? ALPRAZolam (XANAX) 1 MG tablet TAKE 1 TABLET BY MOUTH THREE TIMES DAILY  ? amphetamine-dextroamphetamine (ADDERALL XR) 30 MG 24 hr capsule Take 30 mg by mouth 2 (two) times daily as needed.  ? DULoxetine (CYMBALTA) 60 MG capsule Take 60 mg by mouth daily.  ? gabapentin (NEURONTIN) 600 MG tablet Take 600 mg by mouth at bedtime.  ? glipiZIDE (GLUCOTROL XL) 10 MG 24 hr tablet Take 10 mg by mouth daily with breakfast.  ? HYDROXYZINE HCL PO Take 50 mg by mouth at bedtime.  ? insulin aspart (NOVOLOG FLEXPEN) 100 UNIT/ML FlexPen Inject 10-16 Units into the skin 3 (three) times daily before meals.  ? insulin glargine (LANTUS) 100 UNIT/ML injection Inject 80 Units  into the skin at bedtime.  ? oxyCODONE-acetaminophen (PERCOCET) 10-325 MG tablet Take 1 tablet by mouth every 4 (four) hours as needed for pain.  ? silver sulfADIAZINE (SILVADENE) 1 % cream Use to area 2-3 times daily  ? sitaGLIPtin (JANUVIA) 50 MG tablet Take 50 mg by mouth daily.  ? solifenacin (VESICARE) 10 MG tablet Take 1 tablet (10 mg total) by mouth daily.  ? [DISCONTINUED] buPROPion (WELLBUTRIN XL) 300 MG 24 hr tablet Take 300 mg by mouth as needed.  ? [DISCONTINUED] NOVOLOG FLEXPEN 100 UNIT/ML FlexPen ADMINISTER 10 TO 16 UNITS UNDER THE SKIN THREE TIMES DAILY WITH MEALS  ? ?No facility-administered encounter medications on file as of 03/01/2022.  ? ? ?ALLERGIES: ?Allergies  ?Allergen Reactions  ? Penicillin G   ? ? ?VACCINATION STATUS: ?Immunization History  ?Administered Date(s) Administered  ? Influenza,inj,Quad PF,6+ Mos 07/16/2013  ? Tdap 11/04/2013, 11/16/2021  ? ? ?Diabetes ?She presents for her follow-up diabetic visit. She has type 2 diabetes mellitus. Onset time: She was diagnosed at approximate age of 48 years. Her disease course has been worsening. There are no hypoglycemic associated symptoms. Pertinent negatives for hypoglycemia include no confusion, headaches, pallor or seizures. Associated symptoms include blurred vision, fatigue, polydipsia and polyuria. Pertinent negatives for diabetes include no chest pain and no polyphagia. There are no hypoglycemic complications. Symptoms are improving. Diabetic complications include peripheral neuropathy. Risk factors for coronary artery disease include dyslipidemia, diabetes mellitus, obesity, sedentary lifestyle, tobacco exposure and family history. Current diabetic treatment includes insulin injections. Her weight is decreasing steadily. She is following a generally unhealthy diet. When asked about meal planning, she reported none. She has not had a previous visit with a dietitian. She never participates in exercise. Her home blood glucose trend is  increasing steadily. Her overall blood glucose range is >200 mg/dl. (During her last visit, she was advised to stay on basal/bolus insulin due to high A1c of 13.3%.  She presents with piece of paper showing her most recent glucose profile measuring twice a day her readings being persistently above 250 mg/day.   ?Her recent A1c was 11%.) An ACE inhibitor/angiotensin II receptor blocker is not being taken.  ?Hyperlipidemia ?This is a chronic problem. The current episode started more than 1 year ago. The problem is uncontrolled. Exacerbating diseases include diabetes. Factors aggravating her hyperlipidemia include smoking. Pertinent negatives include no chest pain, myalgias or shortness of breath. She is currently on no antihyperlipidemic treatment. Risk factors for coronary artery disease include diabetes mellitus, dyslipidemia, family  history, obesity and a sedentary lifestyle.  ?Hypertension ?This is a chronic problem. The problem is controlled. Associated symptoms include blurred vision. Pertinent negatives include no chest pain, headaches, palpitations or shortness of breath. Risk factors for coronary artery disease include dyslipidemia, diabetes mellitus, smoking/tobacco exposure, sedentary lifestyle and family history. Past treatments include calcium channel blockers.  ? ? ?Review of Systems  ?Constitutional:  Positive for fatigue. Negative for chills, fever and unexpected weight change.  ?HENT:  Negative for trouble swallowing and voice change.   ?Eyes:  Positive for blurred vision. Negative for visual disturbance.  ?Respiratory:  Negative for cough, shortness of breath and wheezing.   ?Cardiovascular:  Negative for chest pain, palpitations and leg swelling.  ?Gastrointestinal:  Negative for diarrhea, nausea and vomiting.  ?Endocrine: Positive for polydipsia and polyuria. Negative for cold intolerance, heat intolerance and polyphagia.  ?Musculoskeletal:  Negative for arthralgias and myalgias.  ?Skin:  Negative  for color change, pallor, rash and wound.  ?Neurological:  Negative for seizures and headaches.  ?Psychiatric/Behavioral:  Negative for confusion and suicidal ideas.   ? ?Objective:  ?  ? ?  03/01/2022  ?

## 2022-03-11 ENCOUNTER — Ambulatory Visit (INDEPENDENT_AMBULATORY_CARE_PROVIDER_SITE_OTHER): Payer: Medicaid Other | Admitting: "Endocrinology

## 2022-03-11 ENCOUNTER — Encounter: Payer: Self-pay | Admitting: "Endocrinology

## 2022-03-11 VITALS — BP 116/68 | HR 100 | Ht 67.5 in | Wt 343.4 lb

## 2022-03-11 DIAGNOSIS — E782 Mixed hyperlipidemia: Secondary | ICD-10-CM | POA: Diagnosis not present

## 2022-03-11 DIAGNOSIS — E1165 Type 2 diabetes mellitus with hyperglycemia: Secondary | ICD-10-CM

## 2022-03-11 MED ORDER — FREESTYLE LIBRE 2 READER DEVI: 0 refills | Status: DC

## 2022-03-11 MED ORDER — NOVOLOG FLEXPEN 100 UNIT/ML ~~LOC~~ SOPN: 20.0000 [IU] | PEN_INJECTOR | Freq: Three times a day (TID) | SUBCUTANEOUS | 2 refills | Status: DC

## 2022-03-11 MED ORDER — FREESTYLE LIBRE 2 SENSOR MISC: 1.0000 | 3 refills | Status: DC

## 2022-03-11 NOTE — Progress Notes (Signed)
03/11/2022, 10:46 AM  Endocrinology follow-up note   Subjective:    Patient ID: Judy Nelson, female    DOB: [birth year].  Judy Nelson is being seen in follow-up after she was seen in consultation for management of currently uncontrolled symptomatic diabetes requested by  [facility].  Patient was seen in April 2021 in consult for type 2 diabetes.  She did not return for follow-up.  Past Medical History:  Diagnosis Date   Anxiety    CSF leak    Depression    Heartburn 09/01/2015   no current med.   History of anemia 01/2014   Non-insulin dependent type 2 diabetes mellitus ([place])    Obesity    PTSD (post-traumatic stress disorder)    Scar of breast 08/2015   left   Sinus headache     Past Surgical History:  Procedure Laterality Date   BREAST SURGERY Bilateral    bilateral breast surgery to remove cyst at [facility] 2017   CRANIOTOMY N/A 06/30/2017   Procedure: BIFRONTAL CRANIOTOMY;  Surgeon: Consuella Lose, MD;  Location: [facility];  Service: Neurosurgery;  Laterality: N/A;  BIFRONTAL CRANIOTOMY Repair of Encephalocele   INCISION AND DRAINAGE ABSCESS Left 10/07/2014   Procedure: INCISION AND DRAINAGE LEFT BREAST ABSCESS;  Surgeon: Jamesetta So, MD;  Location: [facility];  Service: General;  Laterality: Left;   LAPAROSCOPIC OVARIAN CYSTECTOMY Right 02/05/2014   Procedure: LAPAROSCOPIC right retroperitoneal (NOT OVARIAN) CYSTECTOMY;  Surgeon: Florian Buff, MD;  Location: [facility];  Service: Gynecology;  Laterality: Right;   MASS EXCISION Left 09/07/2015   Procedure: Minor SCAR EXCISION LEFT BREAST, PLASTIC CLOSURE;  Surgeon: Cristine Polio, MD;  Location: [facility];  Service: Plastics;  Laterality: Left;   TYMPANOSTOMY TUBE PLACEMENT Bilateral 10/17/2014    Social History   Socioeconomic History   Marital status: Single    Spouse name: Not on file   Number of children: Not on  file   Years of education: Not on file   Highest education level: Not on file  Occupational History   Not on file  Tobacco Use   Smoking status: Every Day    Packs/day: 0.50    Years: 7.00    Pack years: 3.50    Types: Cigarettes   Smokeless tobacco: Never  Vaping Use   Vaping Use: Never used  Substance and Sexual Activity   Alcohol use: Yes    Comment: occasionally   Drug use: No   Sexual activity: Yes    Birth control/protection: None  Other Topics Concern   Not on file  Social History Narrative   Not on file   Social Determinants of Health   Financial Resource Strain: Not on file  Food Insecurity: Not on file  Transportation Needs: Not on file  Physical Activity: Not on file  Stress: Not on file  Social Connections: Not on file    Family History  Problem Relation Age of Onset   Diabetes Father    Hypertension Father    Heart disease Daughter        heart murmur   Cancer Maternal Grandmother        ovarian cancer   Obesity Paternal Grandmother    Diabetes Paternal  Grandfather     Outpatient Encounter Medications as of 03/11/2022  Medication Sig   Continuous Blood Gluc Receiver (FREESTYLE LIBRE 2 READER) DEVI As directed   Continuous Blood Gluc Sensor (FREESTYLE LIBRE 2 SENSOR) MISC 1 Piece by Does not apply route every 14 (fourteen) days.   albuterol (PROVENTIL HFA;VENTOLIN HFA) 108 (90 Base) MCG/ACT inhaler Inhale 2 puffs into the lungs every 6 (six) hours as needed for wheezing or shortness of breath.   ALPRAZolam (XANAX) 1 MG tablet TAKE 1 TABLET BY MOUTH THREE TIMES DAILY   amphetamine-dextroamphetamine (ADDERALL XR) 30 MG 24 hr capsule Take 30 mg by mouth 2 (two) times daily as needed.   DULoxetine (CYMBALTA) 60 MG capsule Take 60 mg by mouth daily.   gabapentin (NEURONTIN) 600 MG tablet Take 600 mg by mouth at bedtime.   glipiZIDE (GLUCOTROL XL) 10 MG 24 hr tablet Take 10 mg by mouth daily with breakfast.   HYDROXYZINE HCL PO Take 50 mg by mouth at  bedtime.   insulin aspart (NOVOLOG FLEXPEN) 100 UNIT/ML FlexPen Inject 20-26 Units into the skin 3 (three) times daily before meals.   insulin glargine (LANTUS) 100 UNIT/ML injection Inject 80 Units into the skin at bedtime.   oxyCODONE-acetaminophen (PERCOCET) 10-325 MG tablet Take 1 tablet by mouth every 4 (four) hours as needed for pain.   silver sulfADIAZINE (SILVADENE) 1 % cream Use to area 2-3 times daily   sitaGLIPtin (JANUVIA) 50 MG tablet Take 50 mg by mouth daily.   solifenacin (VESICARE) 10 MG tablet Take 1 tablet (10 mg total) by mouth daily.   [DISCONTINUED] insulin aspart (NOVOLOG FLEXPEN) 100 UNIT/ML FlexPen Inject 10-16 Units into the skin 3 (three) times daily before meals.   No facility-administered encounter medications on file as of 03/11/2022.    ALLERGIES: Allergies  Allergen Reactions   Penicillin G     VACCINATION STATUS: Immunization History  Administered Date(s) Administered   Influenza,inj,Quad PF,6+ Mos 07/16/2013   Tdap 11/04/2013, 11/16/2021    Diabetes She presents for her follow-up diabetic visit. She has type 2 diabetes mellitus. Onset time: She was diagnosed at approximate age of 89 years. Her disease course has been worsening. There are no hypoglycemic associated symptoms. Pertinent negatives for hypoglycemia include no confusion, headaches, pallor or seizures. Associated symptoms include blurred vision, fatigue, polydipsia and polyuria. Pertinent negatives for diabetes include no chest pain and no polyphagia. There are no hypoglycemic complications. Symptoms are worsening. Diabetic complications include peripheral neuropathy. Risk factors for coronary artery disease include dyslipidemia, diabetes mellitus, obesity, sedentary lifestyle, tobacco exposure and family history. Current diabetic treatment includes insulin injections. Her weight is decreasing steadily. She is following a generally unhealthy diet. When asked about meal planning, she reported none.  She has not had a previous visit with a dietitian. She never participates in exercise. Her home blood glucose trend is increasing steadily. Her breakfast blood glucose range is generally >200 mg/dl. Her lunch blood glucose range is generally >200 mg/dl. Her dinner blood glucose range is generally >200 mg/dl. Her bedtime blood glucose range is generally >200 mg/dl. Her overall blood glucose range is >200 mg/dl. (Ms. Noble presents with continued, severe hyperglycemic burden.  She did not document hypoglycemia.  Her recent A1c was 11%.   ) An ACE inhibitor/angiotensin II receptor blocker is not being taken.  Hyperlipidemia This is a chronic problem. The current episode started more than 1 year ago. The problem is uncontrolled. Exacerbating diseases include diabetes. Factors aggravating her hyperlipidemia include smoking. Pertinent  negatives include no chest pain, myalgias or shortness of breath. She is currently on no antihyperlipidemic treatment. Risk factors for coronary artery disease include diabetes mellitus, dyslipidemia, family history, obesity and a sedentary lifestyle.  Hypertension This is a chronic problem. The problem is controlled. Associated symptoms include blurred vision. Pertinent negatives include no chest pain, headaches, palpitations or shortness of breath. Risk factors for coronary artery disease include dyslipidemia, diabetes mellitus, smoking/tobacco exposure, sedentary lifestyle and family history. Past treatments include calcium channel blockers.    Review of Systems  Constitutional:  Positive for fatigue. Negative for chills, fever and unexpected weight change.  HENT:  Negative for trouble swallowing and voice change.   Eyes:  Positive for blurred vision. Negative for visual disturbance.  Respiratory:  Negative for cough, shortness of breath and wheezing.   Cardiovascular:  Negative for chest pain, palpitations and leg swelling.  Gastrointestinal:  Negative for diarrhea, nausea  and vomiting.  Endocrine: Positive for polydipsia and polyuria. Negative for cold intolerance, heat intolerance and polyphagia.  Musculoskeletal:  Negative for arthralgias and myalgias.  Skin:  Negative for color change, pallor, rash and wound.  Neurological:  Negative for seizures and headaches.  Psychiatric/Behavioral:  Negative for confusion and suicidal ideas.    Objective:       03/11/2022    8:47 AM 03/01/2022    8:46 AM 02/21/2022    4:05 PM  Vitals with BMI  Height 5' 7.5" 5' 7.5"   Weight 343 lbs 6 oz 348 lbs 340 lbs  BMI 85.46 27.03   Systolic 500 938 182  Diastolic 68 88 993  Pulse 100 88 117    BP 116/68   Pulse 100   Ht 5' 7.5" (1.715 m)   Wt (!) 343 lb 6.4 oz (155.8 kg)   BMI 52.99 kg/m   Wt Readings from Last 3 Encounters:  03/11/22 (!) 343 lb 6.4 oz (155.8 kg)  03/01/22 (!) 348 lb (157.9 kg)  02/21/22 (!) 340 lb (154.2 kg)       CMP ( most recent) CMP     Component Value Date/Time   NA 135 (A) 02/09/2022 0000   K 4.6 02/09/2022 0000   CL 96 (A) 02/09/2022 0000   CO2 22 02/09/2022 0000   GLUCOSE 323 (H) 11/16/2021 0902   BUN 8 02/09/2022 0000   CREATININE 0.8 02/09/2022 0000   CREATININE 0.67 11/16/2021 0902   CREATININE 0.44 (L) 10/22/2013 1247   CALCIUM 9.7 02/09/2022 0000   PROT 7.8 04/14/2016 0038   PROT 7.3 07/20/2015 1119   ALBUMIN 3.9 02/09/2022 0000   ALBUMIN 3.8 07/20/2015 1119   AST 15 02/09/2022 0000   ALT 14 02/09/2022 0000   ALKPHOS 148 (A) 02/09/2022 0000   BILITOT 0.3 04/14/2016 0038   BILITOT 0.3 07/20/2015 1119   GFRNONAA >60 11/16/2021 0902   GFRAA >60 03/12/2018 1315     Diabetic Labs (most recent): Lab Results  Component Value Date   HGBA1C 11.0 02/09/2022   HGBA1C 13.3 12/12/2019   HGBA1C 6.8 (H) 07/20/2015     Lipid Panel ( most recent) Lipid Panel     Component Value Date/Time   CHOL 217 (A) 02/09/2022 0000   CHOL 190 07/20/2015 1119   TRIG 385 (A) 02/09/2022 0000   HDL 39 02/09/2022 0000   HDL 43  07/20/2015 1119   CHOLHDL 4.4 07/20/2015 1119   LDLCALC 112 02/09/2022 0000   LDLCALC 127 (H) 07/20/2015 1119   LABVLDL 20 07/20/2015 1119  Lab Results  Component Value Date   TSH 1.41 02/09/2022   TSH 1.580 07/20/2015   TSH 1.250 10/05/2014      Assessment & Plan:   1. Uncontrolled type 2 diabetes mellitus with hyperglycemia ([place])  - Lafawn Lenoir has currently uncontrolled symptomatic type 2 DM since  36 years of age.  Ms. Brummitt presents with continued, severe hyperglycemic burden.  She did not document hypoglycemia.  Her recent A1c was 11%.   Recent labs reviewed. -She presents with persistent hyperglycemia. -her diabetes is complicated by peripheral neuropathy, obesity/sedentary life, smoking and she remains at a high risk for more acute and chronic complications which include CAD, CVA, CKD, retinopathy, and neuropathy. These are all discussed in detail with her.  In light of her comorbid conditions of obesity, type 2 diabetes, hyperlipidemia, hypertension, she is a candidate for lifestyle medicine.  - she acknowledges that there is a room for improvement in her food and drink choices. - Suggestion is made for her to avoid simple carbohydrates  from her diet including Cakes, Sweet Desserts, Ice Cream, Soda (diet and regular), Sweet Tea, Candies, Chips, Cookies, Store Bought Juices, Alcohol , Artificial Sweeteners,  Coffee Creamer, and "Sugar-free" Products, Lemonade. This will help patient to have more stable blood glucose profile and potentially avoid unintended weight gain.  The following Lifestyle Medicine recommendations according to [facility]) were discussed and and offered to patient and she  agrees to start the journey:  A. Whole Foods, Plant-Based Nutrition comprising of fruits and vegetables, plant-based proteins, whole-grain carbohydrates was discussed in detail with the patient.   A list for source of those nutrients were also  provided to the patient.  Patient will use only water or unsweetened tea for hydration. B.  The need to stay away from risky substances including alcohol, smoking; obtaining 7 to 9 hours of restorative sleep, at least 150 minutes of moderate intensity exercise weekly, the importance of healthy social connections,  and stress management techniques were discussed. C.  A full color page of  Calorie density of various food groups per pound showing examples of each food groups was provided to the patient.   - she will be scheduled with Jearld Fenton, RDN, CDE for diabetes education.  - I have approached her with the following individualized plan to manage  her diabetes and patient agrees:   -Even while she is working on her lifestyle, based on  her chronic glycemic burden, she will continue to need intensive treatment with basal/bolus insulin in order for her to achieve control of diabetes with target.   -Accordingly, she is advised to continue Lantus 80 units nightly, increase NovoLog to 20-26 units 3 times daily AC for Premeal blood glucose readings above 90 mg per DL.  - she is warned not to take insulin without proper monitoring per orders.  - she is encouraged to call clinic for blood glucose levels less than 70 or above 300 mg /dl. - she is advised to continue glipizide 10 mg XL p.o. daily at breakfast, Januvia 50 mg p.o. daily at breakfast.  She does not tolerate Metformin.   She would benefit from a CGM.  I discussed and prescribed the freestyle libre device for her.     - she will be too high risk to give  GLP1 RA for smoking, and significant hypertriglyceridemia putting her at risk for pancreatitis.  - Specific targets for  A1c;  LDL, HDL,  and Triglycerides were discussed  with the patient.  2) Blood Pressure /Hypertension:  Her blood pressure is controlled to target.    3) Lipids/Hyperlipidemia:   Review of her recent lipid panel showed  uncontrolled  LDL at  112.  she  Is not on  statins.  She is hesitant to start medications at this time.  The above described WF PB diet will address dyslipidemia as well.   She will be considered for fasting lipid panel on subsequent visits.    4)  Weight/Diet:  Body mass index is 52.99 kg/m.  -   clearly complicating her diabetes care.   she is  a candidate for modest weight loss. I discussed with her the fact that loss of 5 - 10% of her  current body weight will have the most impact on her diabetes management.  Exercise, and detailed carbohydrates information provided  -  detailed on discharge instructions.  5) Chronic Care/Health Maintenance:  -she  Is not on ACEI/ARB and Statin medications and  is encouraged to initiate and continue to follow up with Ophthalmology, Dentist,  Podiatrist at least yearly or according to recommendations, and advised to  quit smoking. I have recommended yearly flu vaccine and pneumonia vaccine at least every 5 years; moderate intensity exercise for up to 150 minutes weekly; and  sleep for at least 7 hours a day.  - she is  advised to maintain close follow up with [facility] for primary care needs, as well as her other providers for optimal and coordinated care.    I spent 34 minutes in the care of the patient today including review of labs from [facility], Lipids, Thyroid Function, Hematology (current and previous including abstractions from other facilities); face-to-face time discussing  her blood glucose readings/logs, discussing hypoglycemia and hyperglycemia episodes and symptoms, medications doses, her options of short and long term treatment based on the latest standards of care / guidelines;  discussion about incorporating lifestyle medicine;  and documenting the encounter.    Please refer to Patient Instructions for Blood Glucose Monitoring and Insulin/Medications Dosing Guide"  in media tab for additional information. Please  also refer to " Patient Self Inventory" in the Media  tab for  reviewed elements of pertinent patient history.  Benjaman Pott participated in the discussions, expressed understanding, and voiced agreement with the above plans.  All questions were answered to her satisfaction. she is encouraged to contact clinic should she have any questions or concerns prior to her return visit.    Follow up plan: - Return in about 9 weeks (around 05/13/2022) for Bring Meter/CGM Device/Logs- A1c in Office.  Glade Lloyd, MD [facility] [address] [place], [place] [address] Phone: [phone]  Fax: [phone]    03/11/2022, 10:46 AM  This note was partially dictated with voice recognition software. Similar sounding words can be transcribed inadequately or may not  be corrected upon review.

## 2022-03-11 NOTE — Patient Instructions (Signed)

## 2022-03-18 ENCOUNTER — Ambulatory Visit: Payer: Medicaid Other | Admitting: "Endocrinology

## 2022-04-13 ENCOUNTER — Encounter: Payer: Self-pay | Admitting: Nutrition

## 2022-04-13 ENCOUNTER — Encounter: Payer: Medicaid Other | Attending: "Endocrinology | Admitting: Nutrition

## 2022-04-13 DIAGNOSIS — E782 Mixed hyperlipidemia: Secondary | ICD-10-CM

## 2022-04-13 DIAGNOSIS — E1165 Type 2 diabetes mellitus with hyperglycemia: Secondary | ICD-10-CM | POA: Insufficient documentation

## 2022-04-13 DIAGNOSIS — Z91199 Patient's noncompliance with other medical treatment and regimen due to unspecified reason: Secondary | ICD-10-CM | POA: Diagnosis present

## 2022-04-13 DIAGNOSIS — I10 Essential (primary) hypertension: Secondary | ICD-10-CM | POA: Insufficient documentation

## 2022-04-13 NOTE — Progress Notes (Signed)
Medical Nutrition Therapy  Appointment Start time:  8182  Appointment End time:  51  Primary concerns today: Dm and Morbid Obesity  Referral diagnosis: e11.8, E66.01 Preferred learning style: No preference   Learning readiness: Ready    NUTRITION ASSESSMENT  36 yr old bfemale her for DM Type 2 and Obesity. Wants to lose weight. She notes she doesn't eat a lot and mostly drinks liquids of tea, juices or soda. Doesn't know why she hasn't been losing weight. Sees Dr. Dorris Fetch, Endocrinology PMH: Hyperlipidemia, HTN, A1C was 11% 02/2022. IS on Lantus 80 units at night  and Novolog 20 units plus sliding scale with meals. She admits she sometimes forgets to take her insulin with meals.  She is willing to work with lifestyle medicine to improve her diabetes and provide needed weight loss.   Anthropometrics  Wt Readings from Last 3 Encounters:  08/11/22 (!) 346 lb 9.6 oz (157.2 kg)  05/12/22 (!) 346 lb 12.8 oz (157.3 kg)  03/11/22 (!) 343 lb 6.4 oz (155.8 kg)   Ht Readings from Last 3 Encounters:  08/11/22 5' 7.5" (1.715 m)  05/12/22 5' 7.5" (1.715 m)  03/11/22 5' 7.5" (1.715 m)   There is no height or weight on file to calculate BMI. '@BMIFA'$ @ Facility age limit for growth %iles is 20 years. Facility age limit for growth %iles is 20 years.    Clinical Medical Hx: see chart Medications: Lantus and Novolog Labs: A1C 11%  Notable Signs/Symptoms: Fatigue, tired, no energy  Lifestyle & Dietary Hx  03/11/2022  Weight /BMI   Weight 343 lb 6.4 oz (H)   Height 5' 7.5" (1.715 m)   BMI 52.99 kg/m2       Estimated daily fluid intake: 80 oz Supplements:  Sleep: poor Stress / self-care: yes Current average weekly physical activity: ADL  24-Hr Dietary Recall First Meal:  8 am Frappe carmel, pancakes or egg sausage mcmuffin Snack:  Second Meal: baked potato and chicken, gatorade Snack:  Third Meal: spaghetti and gatorade, Snack:  Beverages: Gatorade, starbucks, sodas, sweet tea,  water  Estimated Energy Needs Calories: 1500 Carbohydrate: 170g Protein: 112g Fat: 42g   NUTRITION DIAGNOSIS  NB-1.1 Food and nutrition-related knowledge deficit As related to Diabetes Type 2 and Obesity.  As evidenced by A1C 11% and BMI 52.Marland Kitchen   NUTRITION INTERVENTION  Nutrition education (E-1) on the following topics:  Nutrition and Diabetes education provided on My Plate, CHO counting, meal planning, portion sizes, timing of meals, avoiding snacks between meals unless having a low blood sugar, target ranges for A1C and blood sugars, signs/symptoms and treatment of hyper/hypoglycemia, monitoring blood sugars, taking medications as prescribed, benefits of exercising 30 minutes per day and prevention of complications of DM.  Lifestyle Medicine  - Whole Food, Plant Predominant Nutrition is highly recommended: Eat Plenty of vegetables, Mushrooms, fruits, Legumes, Whole Grains, Nuts, seeds in lieu of processed meats, processed snacks/pastries red meat, poultry, eggs.    -It is better to avoid simple carbohydrates including: Cakes, Sweet Desserts, Ice Cream, Soda (diet and regular), Sweet Tea, Candies, Chips, Cookies, Store Bought Juices, Alcohol in Excess of  1-2 drinks a day, Lemonade,  Artificial Sweeteners, Doughnuts, Coffee Creamers, "Sugar-free" Products, etc, etc.  This is not a complete list.....  Exercise: If you are able: 30 -60 minutes a day ,4 days a week, or 150 minutes a week.  The longer the better.  Combine stretch, strength, and aerobic activities.  If you were told in the past that you have  high risk for cardiovascular diseases, you may seek evaluation by your heart doctor prior to initiating moderate to intense exercise programs.   Handouts Provided Include  lifestyle Medicine Meal Plan Card  Learning Style & Readiness for Change Teaching method utilized: Visual & Auditory  Demonstrated degree of understanding via: Teach Back  Barriers to learning/adherence to lifestyle  change: emotional eating  Goals Established by Pt Goals  Drink more water 3 bottles of water per day Work on timing of meals;  Don't past 7 pm. Reduce frappe to only 2 times per week- Track in food in myfitness pal   MONITORING & EVALUATION Dietary intake, weekly physical activity, BS"s and weight in 1 month.  Next Steps  Patient is to work on meal planning .

## 2022-04-13 NOTE — Patient Instructions (Signed)
Goals  Drink more water 3 bottles of water per day Work on timing of meals;  Don't past 7 pm. Reduce frappe to only 2 times per week- Track in food in [employer]

## 2022-04-25 ENCOUNTER — Telehealth: Payer: Self-pay | Admitting: "Endocrinology

## 2022-04-25 DIAGNOSIS — E1165 Type 2 diabetes mellitus with hyperglycemia: Secondary | ICD-10-CM

## 2022-04-25 MED ORDER — FREESTYLE LIBRE 2 SENSOR MISC: 1.0000 | 2 refills | Status: DC

## 2022-04-25 NOTE — Telephone Encounter (Signed)
Rx refill sent.

## 2022-04-25 NOTE — Telephone Encounter (Signed)
Pt requesting a refill  Continuous Blood Gluc Sensor (FREESTYLE LIBRE 2 SENSOR) MISC   [id number] - [place] - [address] AT [place] Phone:  [phone]  Fax:  [phone]

## 2022-05-12 ENCOUNTER — Encounter: Payer: Medicaid Other | Attending: "Endocrinology | Admitting: Nutrition

## 2022-05-12 ENCOUNTER — Ambulatory Visit (INDEPENDENT_AMBULATORY_CARE_PROVIDER_SITE_OTHER): Payer: Medicaid Other | Admitting: "Endocrinology

## 2022-05-12 ENCOUNTER — Encounter: Payer: Self-pay | Admitting: Nutrition

## 2022-05-12 ENCOUNTER — Encounter: Payer: Self-pay | Admitting: "Endocrinology

## 2022-05-12 VITALS — BP 132/86 | HR 108 | Ht 67.5 in | Wt 346.8 lb

## 2022-05-12 DIAGNOSIS — I10 Essential (primary) hypertension: Secondary | ICD-10-CM | POA: Insufficient documentation

## 2022-05-12 DIAGNOSIS — Z91199 Patient's noncompliance with other medical treatment and regimen due to unspecified reason: Secondary | ICD-10-CM | POA: Diagnosis present

## 2022-05-12 DIAGNOSIS — E782 Mixed hyperlipidemia: Secondary | ICD-10-CM

## 2022-05-12 DIAGNOSIS — E1165 Type 2 diabetes mellitus with hyperglycemia: Secondary | ICD-10-CM | POA: Insufficient documentation

## 2022-05-12 LAB — POCT GLYCOSYLATED HEMOGLOBIN (HGB A1C): HbA1c, POC (controlled diabetic range): 10.7 % — AB (ref 0.0–7.0)

## 2022-05-12 MED ORDER — NOVOLOG FLEXPEN 100 UNIT/ML ~~LOC~~ SOPN: 24.0000 [IU] | PEN_INJECTOR | Freq: Three times a day (TID) | SUBCUTANEOUS | 2 refills | Status: DC

## 2022-05-12 MED ORDER — TIRZEPATIDE 2.5 MG/0.5ML ~~LOC~~ SOAJ: 2.5000 mg | SUBCUTANEOUS | 2 refills | Status: DC

## 2022-05-12 NOTE — Progress Notes (Signed)
Medical Nutrition Therapy  Appointment Start time:  [phone] Appointment End time:  97  Primary concerns today: Dm and Morbid Obesity  Referral diagnosis: e11.8, E66.01 Preferred learning style: No preference   Learning readiness: Ready    NUTRITION ASSESSMENT Follow up DM Type 2 and Severe obesity  Sees Dr. Dorris Fetch today. Has a libre .  She has lost her cousin to colon cancer and that has had off schedule and emotionally drained. She realized she needs to take care of herself so she doesn't die. Has a history of drinking calories from sodas, juices and tea. Diet is high in processed foods. Willing to get back on track with eating meals on time and working on meal prepping. A1c 10.7% Lantus 80 units, Novolog 24 units with meals plus sliding scale.  Anthropometrics  Wt Readings from Last 3 Encounters:  05/12/22 (!) 346 lb 12.8 oz (157.3 kg)  03/11/22 (!) 343 lb 6.4 oz (155.8 kg)  03/01/22 (!) 348 lb (157.9 kg)   Ht Readings from Last 3 Encounters:  05/12/22 5' 7.5" (1.715 m)  03/11/22 5' 7.5" (1.715 m)  03/01/22 5' 7.5" (1.715 m)   There is no height or weight on file to calculate BMI. '@BMIFA'$ @ Facility age limit for growth %iles is 20 years. Facility age limit for growth %iles is 20 years.    Clinical Medical Hx:  Dm Type 2, Obesity, Hyperlipidemia,  See chart Medications: Lantus 80 units, 24 units of Novolog with meals plus sliding scale. Labs:  Lab Results  Component Value Date   HGBA1C 10.7 (A) 05/12/2022      Latest Ref Rng & Units 02/09/2022   12:00 AM 11/16/2021    9:02 AM 03/12/2018    1:15 PM  CMP  Glucose 70 - 99 mg/dL  323  283   BUN 4 - '21 8     8  7   '$ Creatinine 0.5 - 1.1 0.8     0.67  0.85   Sodium 137 - 147 135     132  136   Potassium 3.5 - 5.1 mEq/L 4.6     3.7  3.3   Chloride 99 - 108 96     98  100   CO2 13 - '22 22     26  25   '$ Calcium 8.7 - 10.7 9.7     8.6  9.0   Alkaline Phos 25 - 125 148        AST 13 - 35 15        ALT 7 - 35 U/L 14            This result is from an external source.   Lipid Panel     Component Value Date/Time   CHOL 217 (A) 02/09/2022 0000   CHOL 190 07/20/2015 1119   TRIG 385 (A) 02/09/2022 0000   HDL 39 02/09/2022 0000   HDL 43 07/20/2015 1119   CHOLHDL 4.4 07/20/2015 1119   LDLCALC 112 02/09/2022 0000   LDLCALC 127 (H) 07/20/2015 1119   LABVLDL 20 07/20/2015 1119    Notable Signs/Symptoms: increased thirst, fatigue, blurry vision,  Lifestyle & Dietary Hx   Estimated daily fluid intake: 20 oz Supplements:  Sleep: varies Stress / self-care: losing family member and her health Current average weekly physical activity: ADL  24-Hr Dietary Recall First Meal:  8 am Sausage egg and cheese mcmuffin;  frappe'  Snack:  Second Meal: baked potato and chicken, gatorade Snack:  Third Meal: spaghetti  and gatorade, Snack:  Beverages: water, frappe,   Estimated Energy Needs Calories: 1500 Carbohydrate: 170g Protein: 112g Fat: 42g   NUTRITION DIAGNOSIS  NB-1.1 Food and nutrition-related knowledge deficit As related to Diabetes Type 2 and Severe obesity.  As evidenced by A1C 10.7% AND BMI > 50.   NUTRITION INTERVENTION  Nutrition education (E-1) on the following topics:  Nutrition and Diabetes education provided on My Plate, CHO counting, meal planning, portion sizes, timing of meals, avoiding snacks between meals unless having a low blood sugar, target ranges for A1C and blood sugars, signs/symptoms and treatment of hyper/hypoglycemia, monitoring blood sugars, taking medications as prescribed, benefits of exercising 30 minutes per day and prevention of complications of DM. Lifestyle Medicine  - Whole Food, Plant Predominant Nutrition is highly recommended: Eat Plenty of vegetables, Mushrooms, fruits, Legumes, Whole Grains, Nuts, seeds in lieu of processed meats, processed snacks/pastries red meat, poultry, eggs.    -It is better to avoid simple carbohydrates including: Cakes, Sweet Desserts, Ice  Cream, Soda (diet and regular), Sweet Tea, Candies, Chips, Cookies, Store Bought Juices, Alcohol in Excess of  1-2 drinks a day, Lemonade,  Artificial Sweeteners, Doughnuts, Coffee Creamers, "Sugar-free" Products, etc, etc.  This is not a complete list.....  Exercise: If you are able: 30 -60 minutes a day ,4 days a week, or 150 minutes a week.  The longer the better.  Combine stretch, strength, and aerobic activities.  If you were told in the past that you have high risk for cardiovascular diseases, you may seek evaluation by your heart doctor prior to initiating moderate to intense exercise programs.   Handouts Provided Include  Lifestyle Medicine handouts  Learning Style & Readiness for Change Teaching method utilized: Visual & Auditory  Demonstrated degree of understanding via: Teach Back  Barriers to learning/adherence to lifestyle change: her size for activity and mobility  Goals Established by Pt Goals  Drink only almond milk Avoid ramen noodles. Increase fresh fruits and vegetables. Try overnight oats and try chia seeds Lose 1 lb per week Take 80 units of Lantus, Novolog 24 units with meals plus sliding scale. Take insulin before meals    MONITORING & EVALUATION Dietary intake, weekly physical activity, and weight and BS  in 1 month.  Next Steps  Patient is to work on eating better balanced meals.Marland Kitchen

## 2022-05-12 NOTE — Progress Notes (Signed)
05/12/2022, 3:06 PM  Endocrinology follow-up note   Subjective:    Patient ID: Judy Nelson, female    DOB: 02-20-1986.  Judy Nelson is being seen in follow-up after she was seen in consultation for management of currently uncontrolled symptomatic diabetes requested by  [facility].  Patient was seen in April 2021 in consult for type 2 diabetes.  She did not return for follow-up.  Past Medical History:  Diagnosis Date   Anxiety    CSF leak    Depression    Heartburn 09/01/2015   no current med.   History of anemia 01/2014   Non-insulin dependent type 2 diabetes mellitus ([place])    Obesity    PTSD (post-traumatic stress disorder)    Scar of breast 08/2015   left   Sinus headache     Past Surgical History:  Procedure Laterality Date   BREAST SURGERY Bilateral    bilateral breast surgery to remove cyst at [facility] 2017   CRANIOTOMY N/A 06/30/2017   Procedure: BIFRONTAL CRANIOTOMY;  Surgeon: Consuella Lose, MD;  Location: [facility];  Service: Neurosurgery;  Laterality: N/A;  BIFRONTAL CRANIOTOMY Repair of Encephalocele   INCISION AND DRAINAGE ABSCESS Left 10/07/2014   Procedure: INCISION AND DRAINAGE LEFT BREAST ABSCESS;  Surgeon: Jamesetta So, MD;  Location: [facility];  Service: General;  Laterality: Left;   LAPAROSCOPIC OVARIAN CYSTECTOMY Right 02/05/2014   Procedure: LAPAROSCOPIC right retroperitoneal (NOT OVARIAN) CYSTECTOMY;  Surgeon: Florian Buff, MD;  Location: [facility];  Service: Gynecology;  Laterality: Right;   MASS EXCISION Left 09/07/2015   Procedure: Minor SCAR EXCISION LEFT BREAST, PLASTIC CLOSURE;  Surgeon: Cristine Polio, MD;  Location: [facility];  Service: Plastics;  Laterality: Left;   TYMPANOSTOMY TUBE PLACEMENT Bilateral 10/17/2014    Social History   Socioeconomic History   Marital status: Single    Spouse name: Not on file   Number of children: Not on  file   Years of education: Not on file   Highest education level: Not on file  Occupational History   Not on file  Tobacco Use   Smoking status: Every Day    Packs/day: 0.50    Years: 7.00    Total pack years: 3.50    Types: Cigarettes   Smokeless tobacco: Never  Vaping Use   Vaping Use: Never used  Substance and Sexual Activity   Alcohol use: Yes    Comment: occasionally   Drug use: No   Sexual activity: Yes    Birth control/protection: None  Other Topics Concern   Not on file  Social History Narrative   Not on file   Social Determinants of Health   Financial Resource Strain: Not on file  Food Insecurity: Not on file  Transportation Needs: Not on file  Physical Activity: Not on file  Stress: Not on file  Social Connections: Not on file    Family History  Problem Relation Age of Onset   Diabetes Father    Hypertension Father    Heart disease Daughter        heart murmur   Cancer Maternal Grandmother        ovarian cancer   Obesity Paternal Grandmother    Diabetes  Paternal Grandfather     Outpatient Encounter Medications as of 05/12/2022  Medication Sig   tirzepatide [facility]) 2.5 MG/0.5ML Pen Inject 2.5 mg into the skin once a week.   albuterol (PROVENTIL HFA;VENTOLIN HFA) 108 (90 Base) MCG/ACT inhaler Inhale 2 puffs into the lungs every 6 (six) hours as needed for wheezing or shortness of breath.   ALPRAZolam (XANAX) 1 MG tablet TAKE 1 TABLET BY MOUTH THREE TIMES DAILY   amphetamine-dextroamphetamine (ADDERALL XR) 30 MG 24 hr capsule Take 30 mg by mouth 2 (two) times daily as needed.   Continuous Blood Gluc Receiver (FREESTYLE LIBRE 2 READER) DEVI As directed   Continuous Blood Gluc Sensor (FREESTYLE LIBRE 2 SENSOR) MISC 1 Piece by Does not apply route every 14 (fourteen) days.   DULoxetine (CYMBALTA) 60 MG capsule Take 60 mg by mouth daily.   gabapentin (NEURONTIN) 600 MG tablet Take 600 mg by mouth at bedtime.   glipiZIDE (GLUCOTROL XL) 10 MG 24 hr tablet  Take 10 mg by mouth daily with breakfast.   HYDROXYZINE HCL PO Take 50 mg by mouth at bedtime.   insulin aspart (NOVOLOG FLEXPEN) 100 UNIT/ML FlexPen Inject 24-30 Units into the skin 3 (three) times daily before meals.   insulin glargine (LANTUS) 100 UNIT/ML injection Inject 80 Units into the skin at bedtime.   oxyCODONE-acetaminophen (PERCOCET) 10-325 MG tablet Take 1 tablet by mouth every 4 (four) hours as needed for pain.   silver sulfADIAZINE (SILVADENE) 1 % cream Use to area 2-3 times daily   solifenacin (VESICARE) 10 MG tablet Take 1 tablet (10 mg total) by mouth daily.   [DISCONTINUED] insulin aspart (NOVOLOG FLEXPEN) 100 UNIT/ML FlexPen Inject 20-26 Units into the skin 3 (three) times daily before meals.   [DISCONTINUED] sitaGLIPtin (JANUVIA) 50 MG tablet Take 50 mg by mouth daily.   No facility-administered encounter medications on file as of 05/12/2022.    ALLERGIES: Allergies  Allergen Reactions   Penicillin G     VACCINATION STATUS: Immunization History  Administered Date(s) Administered   Influenza,inj,Quad PF,6+ Mos 07/16/2013   Tdap 11/04/2013, 11/16/2021    Diabetes She presents for her follow-up diabetic visit. She has type 2 diabetes mellitus. Onset time: She was diagnosed at approximate age of 2 years. Her disease course has been worsening. There are no hypoglycemic associated symptoms. Pertinent negatives for hypoglycemia include no confusion, headaches, pallor or seizures. Associated symptoms include fatigue, polydipsia and polyuria. Pertinent negatives for diabetes include no blurred vision, no chest pain and no polyphagia. There are no hypoglycemic complications. Symptoms are improving. Diabetic complications include peripheral neuropathy. Risk factors for coronary artery disease include dyslipidemia, diabetes mellitus, obesity, sedentary lifestyle, tobacco exposure and family history. Current diabetic treatment includes insulin injections. Her weight is fluctuating  minimally. She is following a generally unhealthy diet. When asked about meal planning, she reported none. She has not had a previous visit with a dietitian. She never participates in exercise. Her home blood glucose trend is fluctuating minimally. Her breakfast blood glucose range is generally >200 mg/dl. Her lunch blood glucose range is generally >200 mg/dl. Her dinner blood glucose range is generally >200 mg/dl. Her bedtime blood glucose range is generally >200 mg/dl. Her overall blood glucose range is >200 mg/dl. (Ms. Dlugosz presents with her CGM.  Her  freestyle libre device was downloaded and analyzed.  AGP report shows 2% time range, 98% above range.  No hypoglycemia.  Her point-of-care A1c is 10.7%.    ) An ACE inhibitor/angiotensin II receptor blocker  is not being taken.  Hyperlipidemia This is a chronic problem. The current episode started more than 1 year ago. The problem is uncontrolled. Exacerbating diseases include diabetes. Factors aggravating her hyperlipidemia include smoking. Pertinent negatives include no chest pain, myalgias or shortness of breath. She is currently on no antihyperlipidemic treatment. Risk factors for coronary artery disease include diabetes mellitus, dyslipidemia, family history, obesity and a sedentary lifestyle.  Hypertension This is a chronic problem. The problem is controlled. Pertinent negatives include no blurred vision, chest pain, headaches, palpitations or shortness of breath. Risk factors for coronary artery disease include dyslipidemia, diabetes mellitus, smoking/tobacco exposure, sedentary lifestyle and family history. Past treatments include calcium channel blockers.     Review of Systems  Constitutional:  Positive for fatigue. Negative for chills, fever and unexpected weight change.  HENT:  Negative for trouble swallowing and voice change.   Eyes:  Negative for blurred vision and visual disturbance.  Respiratory:  Negative for cough, shortness of  breath and wheezing.   Cardiovascular:  Negative for chest pain, palpitations and leg swelling.  Gastrointestinal:  Negative for diarrhea, nausea and vomiting.  Endocrine: Positive for polydipsia and polyuria. Negative for cold intolerance, heat intolerance and polyphagia.  Musculoskeletal:  Negative for arthralgias and myalgias.  Skin:  Negative for color change, pallor, rash and wound.  Neurological:  Negative for seizures and headaches.  Psychiatric/Behavioral:  Negative for confusion and suicidal ideas.     Objective:       05/12/2022    2:20 PM 03/11/2022    8:47 AM 03/01/2022    8:46 AM  Vitals with BMI  Height 5' 7.5" 5' 7.5" 5' 7.5"  Weight 346 lbs 13 oz 343 lbs 6 oz 348 lbs  BMI 53.48 32.20 25.42  Systolic 706 237 628  Diastolic 86 68 88  Pulse 315 100 88    BP 132/86   Pulse (!) 108   Ht 5' 7.5" (1.715 m)   Wt (!) 346 lb 12.8 oz (157.3 kg)   BMI 53.51 kg/m   Wt Readings from Last 3 Encounters:  05/12/22 (!) 346 lb 12.8 oz (157.3 kg)  03/11/22 (!) 343 lb 6.4 oz (155.8 kg)  03/01/22 (!) 348 lb (157.9 kg)       CMP ( most recent) CMP     Component Value Date/Time   NA 135 (A) 02/09/2022 0000   K 4.6 02/09/2022 0000   CL 96 (A) 02/09/2022 0000   CO2 22 02/09/2022 0000   GLUCOSE 323 (H) 11/16/2021 0902   BUN 8 02/09/2022 0000   CREATININE 0.8 02/09/2022 0000   CREATININE 0.67 11/16/2021 0902   CREATININE 0.44 (L) 10/22/2013 1247   CALCIUM 9.7 02/09/2022 0000   PROT 7.8 04/14/2016 0038   PROT 7.3 07/20/2015 1119   ALBUMIN 3.9 02/09/2022 0000   ALBUMIN 3.8 07/20/2015 1119   AST 15 02/09/2022 0000   ALT 14 02/09/2022 0000   ALKPHOS 148 (A) 02/09/2022 0000   BILITOT 0.3 04/14/2016 0038   BILITOT 0.3 07/20/2015 1119   GFRNONAA >60 11/16/2021 0902   GFRAA >60 03/12/2018 1315     Diabetic Labs (most recent): Lab Results  Component Value Date   HGBA1C 10.7 (A) 05/12/2022   HGBA1C 11.0 02/09/2022   HGBA1C 13.3 12/12/2019     Lipid Panel ( most  recent) Lipid Panel     Component Value Date/Time   CHOL 217 (A) 02/09/2022 0000   CHOL 190 07/20/2015 1119   TRIG 385 (A) 02/09/2022 0000  HDL 39 02/09/2022 0000   HDL 43 07/20/2015 1119   CHOLHDL 4.4 07/20/2015 1119   LDLCALC 112 02/09/2022 0000   LDLCALC 127 (H) 07/20/2015 1119   LABVLDL 20 07/20/2015 1119     Lab Results  Component Value Date   TSH 1.41 02/09/2022   TSH 1.580 07/20/2015   TSH 1.250 10/05/2014      Assessment & Plan:   1. Uncontrolled type 2 diabetes mellitus with hyperglycemia ([place])  - Pricilla Moehle has currently uncontrolled symptomatic type 2 DM since  36 years of age.  Ms. Seiple presents with her CGM.  Her  freestyle libre device was downloaded and analyzed.  AGP report shows 2% time range, 98% above range.  No hypoglycemia.  Her point-of-care A1c is 10.7%.    Recent labs reviewed. -She presents with persistent hyperglycemia. -her diabetes is complicated by peripheral neuropathy, obesity/sedentary life, smoking and she remains at a high risk for more acute and chronic complications which include CAD, CVA, CKD, retinopathy, and neuropathy. These are all discussed in detail with her.  In light of her comorbid conditions of obesity, type 2 diabetes, hyperlipidemia, hypertension, she is a candidate for lifestyle medicine.  - she acknowledges that there is a room for improvement in her food and drink choices. - Suggestion is made for her to avoid simple carbohydrates  from her diet including Cakes, Sweet Desserts, Ice Cream, Soda (diet and regular), Sweet Tea, Candies, Chips, Cookies, Store Bought Juices, Alcohol , Artificial Sweeteners,  Coffee Creamer, and "Sugar-free" Products, Lemonade. This will help patient to have more stable blood glucose profile and potentially avoid unintended weight gain.  The following Lifestyle Medicine recommendations according to [place]  [facility]) were discussed and and offered to patient and  she  agrees to start the journey:  A. Whole Foods, Plant-Based Nutrition comprising of fruits and vegetables, plant-based proteins, whole-grain carbohydrates was discussed in detail with the patient.   A list for source of those nutrients were also provided to the patient.  Patient will use only water or unsweetened tea for hydration. B.  The need to stay away from risky substances including alcohol, smoking; obtaining 7 to 9 hours of restorative sleep, at least 150 minutes of moderate intensity exercise weekly, the importance of healthy social connections,  and stress management techniques were discussed. C.  A full color page of  Calorie density of various food groups per pound showing examples of each food groups was provided to the patient.   - she will be scheduled with Jearld Fenton, RDN, CDE for diabetes education.  - I have approached her with the following individualized plan to manage  her diabetes and patient agrees:   -Even while she is working on her lifestyle, based on  her chronic glycemic burden, she will continue to need intensive treatment with basal/bolus insulin in order for her to achieve control of diabetes to target.    -Accordingly, she is advised to continue Lantus 80 units nightly, increase NovoLog to 24-30  units 3 times daily AC for [place] blood glucose readings above 90 mg per DL.  - she is warned not to take insulin without proper monitoring per orders.  - she is encouraged to call clinic for blood glucose levels less than 70 or above 200 mg /dl. - she is advised to continue glipizide 10 mg XL p.o. daily at breakfast. -She would benefit more from a GLP-1 receptor agonist and DPP 4 behavior.  She is advised  to discontinue Januvia.  I discussed initiated Mounjaro 2.5 mg subcutaneously weekly, to advance as tolerated.   She is encouraged to use her CGM at all times.   - Specific targets for  A1c;  LDL, HDL,  and Triglycerides were discussed with the patient.  2)  Blood Pressure /Hypertension:  Her blood pressure is controlled to target.    3) Lipids/Hyperlipidemia:   Review of her recent lipid panel showed  uncontrolled  LDL at  112.  she  Is not on statins.  She is hesitant to start medications at this time.  The above described WF PB diet will address dyslipidemia as well.   She will be considered for fasting lipid panel on subsequent visits.    4)  Weight/Diet:  Body mass index is 53.51 kg/m.  -She is gaining weight.  Clearly complicating her diabetes care.   she is  a candidate for modest weight loss. I discussed with her the fact that loss of 5 - 10% of her  current body weight will have the most impact on her diabetes management.  Exercise, and detailed carbohydrates information provided  -  detailed on discharge instructions.  5) Chronic Care/Health Maintenance:  -she  Is not on ACEI/ARB and Statin medications and  is encouraged to initiate and continue to follow up with Ophthalmology, Dentist,  Podiatrist at least yearly or according to recommendations, and advised to  quit smoking. I have recommended yearly flu vaccine and pneumonia vaccine at least every 5 years; moderate intensity exercise for up to 150 minutes weekly; and  sleep for at least 7 hours a day.  - she is  advised to maintain close follow up with [facility] for primary care needs, as well as her other providers for optimal and coordinated care.   I spent 41 minutes in the care of the patient today including review of labs from [place], Lipids, Thyroid Function, Hematology (current and previous including abstractions from other facilities); face-to-face time discussing  her blood glucose readings/logs, discussing hypoglycemia and hyperglycemia episodes and symptoms, medications doses, her options of short and long term treatment based on the latest standards of care / guidelines;  discussion about incorporating lifestyle medicine;  and documenting the encounter. Risk  reduction counseling performed per USPSTF guidelines to reduce obesity and cardiovascular risk factors.     Please refer to Patient Instructions for Blood Glucose Monitoring and Insulin/Medications Dosing Guide"  in media tab for additional information. Please  also refer to " Patient Self Inventory" in the Media  tab for reviewed elements of pertinent patient history.  Benjaman Pott participated in the discussions, expressed understanding, and voiced agreement with the above plans.  All questions were answered to her satisfaction. she is encouraged to contact clinic should she have any questions or concerns prior to her return visit.    Follow up plan: - Return in about 3 months (around 08/12/2022) for F/U with Pre-visit Labs, Meter/CGM/Logs, A1c here.  Glade Lloyd, MD [facility] [address] [place], [place] [address] Phone: [phone]  Fax: [phone]    05/12/2022, 3:06 PM  This note was partially dictated with voice recognition software. Similar sounding words can be transcribed inadequately or may not  be corrected upon review.

## 2022-05-12 NOTE — Patient Instructions (Addendum)
Goals  Drink only almond milk Avoid ramen noodles. Increase fresh fruits and vegetables. Try overnight oats and try chia seeds Lose 1 lb per week Take 80 units of Lantus, Novolog 24 units with meals plus sliding scale. Take insulin before meals

## 2022-05-12 NOTE — Patient Instructions (Signed)

## 2022-05-13 ENCOUNTER — Telehealth: Payer: Self-pay

## 2022-05-13 ENCOUNTER — Ambulatory Visit: Payer: Medicaid Other | Admitting: "Endocrinology

## 2022-05-13 NOTE — Telephone Encounter (Signed)
Sent PA for [employer] to [facility] through [employer].

## 2022-05-24 ENCOUNTER — Telehealth: Payer: Self-pay | Admitting: "Endocrinology

## 2022-05-24 DIAGNOSIS — E1165 Type 2 diabetes mellitus with hyperglycemia: Secondary | ICD-10-CM

## 2022-05-24 MED ORDER — FREESTYLE LIBRE 2 SENSOR MISC: 1.0000 | 2 refills | Status: DC

## 2022-05-24 NOTE — Telephone Encounter (Signed)
Pt is requesting refills on her libre sensor 2 to be sent to [facility] on [address]

## 2022-05-24 NOTE — Telephone Encounter (Signed)
Rx sent 

## 2022-05-31 ENCOUNTER — Telehealth: Payer: Self-pay | Admitting: "Endocrinology

## 2022-05-31 DIAGNOSIS — E1165 Type 2 diabetes mellitus with hyperglycemia: Secondary | ICD-10-CM

## 2022-05-31 MED ORDER — NOVOLOG FLEXPEN 100 UNIT/ML ~~LOC~~ SOPN: 24.0000 [IU] | PEN_INJECTOR | Freq: Three times a day (TID) | SUBCUTANEOUS | 2 refills | Status: DC

## 2022-05-31 NOTE — Telephone Encounter (Signed)
Rx with updated dosing sent to [facility].

## 2022-05-31 NOTE — Telephone Encounter (Signed)
Pt states she is out of her novolog because the RX was increased and it was not updated at pharmacy. She said her PCP normally sends this in but will not this time because of the change. Please advise

## 2022-06-22 ENCOUNTER — Telehealth: Payer: Self-pay | Admitting: "Endocrinology

## 2022-06-22 DIAGNOSIS — E1165 Type 2 diabetes mellitus with hyperglycemia: Secondary | ICD-10-CM

## 2022-06-22 MED ORDER — FREESTYLE LIBRE 2 SENSOR MISC: 1.0000 | 2 refills | Status: DC

## 2022-06-22 NOTE — Telephone Encounter (Signed)
Pt is requesting a refill on her sensor to be sent to [facility] on [address]

## 2022-06-22 NOTE — Telephone Encounter (Signed)
Rx refill sent.

## 2022-06-29 ENCOUNTER — Telehealth: Payer: Self-pay | Admitting: "Endocrinology

## 2022-06-29 NOTE — Telephone Encounter (Signed)
New message     Patient called wanted MD to know that insurance denied tirzepatide [facility]) 2.5 MG/0.5ML Pen  Please advise on next step.

## 2022-06-30 ENCOUNTER — Other Ambulatory Visit: Payer: Self-pay | Admitting: "Endocrinology

## 2022-06-30 MED ORDER — SEMAGLUTIDE (1 MG/DOSE) 4 MG/3ML ~~LOC~~ SOPN: 1.0000 mg | PEN_INJECTOR | SUBCUTANEOUS | 2 refills | Status: DC

## 2022-06-30 NOTE — Telephone Encounter (Signed)
Discussed with pt, understanding voiced. 

## 2022-06-30 NOTE — Telephone Encounter (Signed)
Left a message requesting pt return call to the office. ?

## 2022-07-06 ENCOUNTER — Telehealth: Payer: Self-pay

## 2022-07-06 ENCOUNTER — Other Ambulatory Visit (HOSPITAL_COMMUNITY): Payer: Self-pay

## 2022-07-06 NOTE — Telephone Encounter (Addendum)
Patient Advocate Encounter  Prior Authorization for Ozempic (1 MG/DOSE) '4MG'$ /3ML pen-injectors has been approved.    Key: [id number] Effective dates: 07/06/2022 through 07/07/2023

## 2022-07-21 ENCOUNTER — Encounter: Payer: Self-pay | Admitting: Nutrition

## 2022-08-11 ENCOUNTER — Encounter: Payer: Self-pay | Admitting: "Endocrinology

## 2022-08-11 ENCOUNTER — Ambulatory Visit: Payer: Medicaid Other | Admitting: "Endocrinology

## 2022-08-11 VITALS — BP 140/78 | HR 80 | Ht 67.5 in | Wt 346.6 lb

## 2022-08-11 DIAGNOSIS — E782 Mixed hyperlipidemia: Secondary | ICD-10-CM

## 2022-08-11 DIAGNOSIS — E1165 Type 2 diabetes mellitus with hyperglycemia: Secondary | ICD-10-CM | POA: Diagnosis not present

## 2022-08-11 LAB — COMPREHENSIVE METABOLIC PANEL
ALT: 25 IU/L (ref 0–32)
AST: 26 IU/L (ref 0–40)
Albumin/Globulin Ratio: 1.1 — ABNORMAL LOW (ref 1.2–2.2)
Albumin: 3.8 g/dL — ABNORMAL LOW (ref 3.9–4.9)
Alkaline Phosphatase: 127 IU/L — ABNORMAL HIGH (ref 44–121)
BUN/Creatinine Ratio: 8 — ABNORMAL LOW (ref 9–23)
BUN: 6 mg/dL (ref 6–20)
Bilirubin Total: <0.2 mg/dL (ref 0.0–1.2)
CO2: 25 mmol/L (ref 20–29)
Calcium: 10 mg/dL (ref 8.7–10.2)
Chloride: 98 mmol/L (ref 96–106)
Creatinine, Ser: 0.71 mg/dL (ref 0.57–1.00)
Globulin, Total: 3.6 g/dL (ref 1.5–4.5)
Glucose: 224 mg/dL — ABNORMAL HIGH (ref 70–99)
Potassium: 4.2 mmol/L (ref 3.5–5.2)
Sodium: 136 mmol/L (ref 134–144)
Total Protein: 7.4 g/dL (ref 6.0–8.5)
eGFR: 114 mL/min/{1.73_m2} (ref >59)

## 2022-08-11 LAB — POCT GLYCOSYLATED HEMOGLOBIN (HGB A1C): HbA1c, POC (controlled diabetic range): 9.7 % — AB (ref 0.0–7.0)

## 2022-08-11 LAB — LIPID PANEL
Chol/HDL Ratio: 5.4 ratio — ABNORMAL HIGH (ref 0.0–4.4)
Cholesterol, Total: 225 mg/dL — ABNORMAL HIGH (ref 100–199)
HDL: 42 mg/dL (ref >39)
LDL Chol Calc (NIH): 129 mg/dL — ABNORMAL HIGH (ref 0–99)
Triglycerides: 305 mg/dL — ABNORMAL HIGH (ref 0–149)
VLDL Cholesterol Cal: 54 mg/dL — ABNORMAL HIGH (ref 5–40)

## 2022-08-11 LAB — T4, FREE: Free T4: 1 ng/dL (ref 0.82–1.77)

## 2022-08-11 LAB — TSH: TSH: 0.91 u[IU]/mL (ref 0.450–4.500)

## 2022-08-11 MED ORDER — ROSUVASTATIN CALCIUM 10 MG PO TABS: 10.0000 mg | ORAL_TABLET | Freq: Every day | ORAL | 1 refills | Status: DC

## 2022-08-11 NOTE — Patient Instructions (Signed)

## 2022-08-11 NOTE — Progress Notes (Signed)
08/11/2022, 1:59 PM  Endocrinology follow-up note   Subjective:    Patient ID: Judy Nelson, female    DOB: 1986-08-11.  Judy Nelson is being seen in follow-up after she was seen in consultation for management of currently uncontrolled symptomatic diabetes requested by  [facility].  Patient was seen in April 2021 in consult for type 2 diabetes.  She did not return for follow-up.  Past Medical History:  Diagnosis Date   Anxiety    CSF leak    Depression    Heartburn 09/01/2015   no current med.   History of anemia 01/2014   Non-insulin dependent type 2 diabetes mellitus ([place])    Obesity    PTSD (post-traumatic stress disorder)    Scar of breast 08/2015   left   Sinus headache     Past Surgical History:  Procedure Laterality Date   BREAST SURGERY Bilateral    bilateral breast surgery to remove cyst at [facility] 2017   CRANIOTOMY N/A 06/30/2017   Procedure: BIFRONTAL CRANIOTOMY;  Surgeon: Consuella Lose, MD;  Location: [facility];  Service: Neurosurgery;  Laterality: N/A;  BIFRONTAL CRANIOTOMY Repair of Encephalocele   INCISION AND DRAINAGE ABSCESS Left 10/07/2014   Procedure: INCISION AND DRAINAGE LEFT BREAST ABSCESS;  Surgeon: Jamesetta So, MD;  Location: [facility];  Service: General;  Laterality: Left;   LAPAROSCOPIC OVARIAN CYSTECTOMY Right 02/05/2014   Procedure: LAPAROSCOPIC right retroperitoneal (NOT OVARIAN) CYSTECTOMY;  Surgeon: Florian Buff, MD;  Location: [facility];  Service: Gynecology;  Laterality: Right;   MASS EXCISION Left 09/07/2015   Procedure: Minor SCAR EXCISION LEFT BREAST, PLASTIC CLOSURE;  Surgeon: Cristine Polio, MD;  Location: [facility];  Service: Plastics;  Laterality: Left;   TYMPANOSTOMY TUBE PLACEMENT Bilateral 10/17/2014    Social History   Socioeconomic History   Marital status: Single    Spouse name: Not on file   Number of children: Not on  file   Years of education: Not on file   Highest education level: Not on file  Occupational History   Not on file  Tobacco Use   Smoking status: Every Day    Packs/day: 0.50    Years: 7.00    Total pack years: 3.50    Types: Cigarettes   Smokeless tobacco: Never  Vaping Use   Vaping Use: Never used  Substance and Sexual Activity   Alcohol use: Yes    Comment: occasionally   Drug use: No   Sexual activity: Yes    Birth control/protection: None  Other Topics Concern   Not on file  Social History Narrative   Not on file   Social Determinants of Health   Financial Resource Strain: Not on file  Food Insecurity: Not on file  Transportation Needs: Not on file  Physical Activity: Not on file  Stress: Not on file  Social Connections: Not on file    Family History  Problem Relation Age of Onset   Diabetes Father    Hypertension Father    Heart disease Daughter        heart murmur   Cancer Maternal Grandmother        ovarian cancer   Obesity Paternal Grandmother    Diabetes  Paternal Grandfather     Outpatient Encounter Medications as of 08/11/2022  Medication Sig   rosuvastatin (CRESTOR) 10 MG tablet Take 1 tablet (10 mg total) by mouth daily.   albuterol (PROVENTIL HFA;VENTOLIN HFA) 108 (90 Base) MCG/ACT inhaler Inhale 2 puffs into the lungs every 6 (six) hours as needed for wheezing or shortness of breath.   ALPRAZolam (XANAX) 1 MG tablet TAKE 1 TABLET BY MOUTH THREE TIMES DAILY   amphetamine-dextroamphetamine (ADDERALL XR) 30 MG 24 hr capsule Take 30 mg by mouth 2 (two) times daily as needed.   Continuous Blood Gluc Receiver (FREESTYLE LIBRE 2 READER) DEVI As directed   Continuous Blood Gluc Sensor (FREESTYLE LIBRE 2 SENSOR) MISC 1 Piece by Does not apply route every 14 (fourteen) days.   DULoxetine (CYMBALTA) 60 MG capsule Take 60 mg by mouth daily.   gabapentin (NEURONTIN) 600 MG tablet Take 600 mg by mouth at bedtime.   glipiZIDE (GLUCOTROL XL) 10 MG 24 hr tablet  Take 10 mg by mouth daily with breakfast.   HYDROXYZINE HCL PO Take 50 mg by mouth at bedtime.   insulin aspart (NOVOLOG FLEXPEN) 100 UNIT/ML FlexPen Inject 24-30 Units into the skin 3 (three) times daily before meals.   insulin glargine (LANTUS) 100 UNIT/ML injection Inject 80 Units into the skin at bedtime.   JANUVIA 100 MG tablet Take 100 mg by mouth daily.   oxyCODONE-acetaminophen (PERCOCET) 10-325 MG tablet Take 1 tablet by mouth every 4 (four) hours as needed for pain.   Semaglutide, 1 MG/DOSE, 4 MG/3ML SOPN Inject 1 mg into the skin once a week.   silver sulfADIAZINE (SILVADENE) 1 % cream Use to area 2-3 times daily   solifenacin (VESICARE) 10 MG tablet Take 1 tablet (10 mg total) by mouth daily.   No facility-administered encounter medications on file as of 08/11/2022.    ALLERGIES: Allergies  Allergen Reactions   Penicillin G     VACCINATION STATUS: Immunization History  Administered Date(s) Administered   Influenza,inj,Quad PF,6+ Mos 07/16/2013   Tdap 11/04/2013, 11/16/2021    Diabetes She presents for her follow-up diabetic visit. She has type 2 diabetes mellitus. Onset time: She was diagnosed at approximate age of 28 years. Her disease course has been improving. There are no hypoglycemic associated symptoms. Pertinent negatives for hypoglycemia include no confusion, headaches, pallor or seizures. Associated symptoms include fatigue, polydipsia and polyuria. Pertinent negatives for diabetes include no blurred vision, no chest pain and no polyphagia. There are no hypoglycemic complications. Symptoms are improving. Diabetic complications include peripheral neuropathy. Risk factors for coronary artery disease include dyslipidemia, diabetes mellitus, obesity, sedentary lifestyle, tobacco exposure and family history. Current diabetic treatment includes insulin injections. Her weight is stable. She is following a generally unhealthy diet. When asked about meal planning, she reported  none. She has not had a previous visit with a dietitian. She never participates in exercise. Her home blood glucose trend is decreasing steadily. Her breakfast blood glucose range is generally >200 mg/dl. Her lunch blood glucose range is generally >200 mg/dl. Her dinner blood glucose range is generally >200 mg/dl. Her bedtime blood glucose range is generally >200 mg/dl. Her overall blood glucose range is >200 mg/dl. (Ms. Limes presents with her CGM.  Her  freestyle libre device was downloaded and analyzed.  AGP report shows 14% time in range, 46 send #1 hyperglycemia, 43% level 2 hyperglycemia.  She does not have hypoglycemia.  Her point-of-care A1c is 9.7%, improving from 10.7%.       )  An ACE inhibitor/angiotensin II receptor blocker is not being taken.  Hyperlipidemia This is a chronic problem. The current episode started more than 1 year ago. The problem is uncontrolled. Exacerbating diseases include diabetes. Factors aggravating her hyperlipidemia include smoking. Pertinent negatives include no chest pain, myalgias or shortness of breath. She is currently on no antihyperlipidemic treatment. Risk factors for coronary artery disease include diabetes mellitus, dyslipidemia, family history, obesity and a sedentary lifestyle.  Hypertension This is a chronic problem. The problem is controlled. Pertinent negatives include no blurred vision, chest pain, headaches, palpitations or shortness of breath. Risk factors for coronary artery disease include dyslipidemia, diabetes mellitus, smoking/tobacco exposure, sedentary lifestyle and family history. Past treatments include calcium channel blockers.     Review of Systems  Constitutional:  Positive for fatigue. Negative for chills, fever and unexpected weight change.  HENT:  Negative for trouble swallowing and voice change.   Eyes:  Negative for blurred vision and visual disturbance.  Respiratory:  Negative for cough, shortness of breath and wheezing.    Cardiovascular:  Negative for chest pain, palpitations and leg swelling.  Gastrointestinal:  Negative for diarrhea, nausea and vomiting.  Endocrine: Positive for polydipsia and polyuria. Negative for cold intolerance, heat intolerance and polyphagia.  Musculoskeletal:  Negative for arthralgias and myalgias.  Skin:  Negative for color change, pallor, rash and wound.  Neurological:  Negative for seizures and headaches.  Psychiatric/Behavioral:  Negative for confusion and suicidal ideas.     Objective:       08/11/2022    1:03 PM 05/12/2022    2:20 PM 03/11/2022    8:47 AM  Vitals with BMI  Height 5' 7.5" 5' 7.5" 5' 7.5"  Weight 346 lbs 10 oz 346 lbs 13 oz 343 lbs 6 oz  BMI 53.45 12.45 80.99  Systolic 833 825 053  Diastolic 78 86 68  Pulse 80 108 100    BP (!) 140/78   Pulse 80   Ht 5' 7.5" (1.715 m)   Wt (!) 346 lb 9.6 oz (157.2 kg)   BMI 53.48 kg/m   Wt Readings from Last 3 Encounters:  08/11/22 (!) 346 lb 9.6 oz (157.2 kg)  05/12/22 (!) 346 lb 12.8 oz (157.3 kg)  03/11/22 (!) 343 lb 6.4 oz (155.8 kg)       CMP ( most recent) CMP     Component Value Date/Time   NA 136 08/10/2022 1208   K 4.2 08/10/2022 1208   CL 98 08/10/2022 1208   CO2 25 08/10/2022 1208   GLUCOSE 224 (H) 08/10/2022 1208   GLUCOSE 323 (H) 11/16/2021 0902   BUN 6 08/10/2022 1208   CREATININE 0.71 08/10/2022 1208   CREATININE 0.44 (L) 10/22/2013 1247   CALCIUM 10.0 08/10/2022 1208   PROT 7.4 08/10/2022 1208   ALBUMIN 3.8 (L) 08/10/2022 1208   AST 26 08/10/2022 1208   ALT 25 08/10/2022 1208   ALKPHOS 127 (H) 08/10/2022 1208   BILITOT <0.2 08/10/2022 1208   GFRNONAA >60 11/16/2021 0902   GFRAA >60 03/12/2018 1315     Diabetic Labs (most recent): Lab Results  Component Value Date   HGBA1C 9.7 (A) 08/11/2022   HGBA1C 10.7 (A) 05/12/2022   HGBA1C 11.0 02/09/2022     Lipid Panel ( most recent) Lipid Panel     Component Value Date/Time   CHOL 225 (H) 08/10/2022 1208   TRIG 305 (H)  08/10/2022 1208   HDL 42 08/10/2022 1208   CHOLHDL 5.4 (H) 08/10/2022 1208  LDLCALC 129 (H) 08/10/2022 1208   LABVLDL 54 (H) 08/10/2022 1208     Lab Results  Component Value Date   TSH 0.910 08/10/2022   TSH 1.41 02/09/2022   TSH 1.580 07/20/2015   TSH 1.250 10/05/2014   FREET4 1.00 08/10/2022      Assessment & Plan:   1. Uncontrolled type 2 diabetes mellitus with hyperglycemia ([place])  - Adriahna Shearman has currently uncontrolled symptomatic type 2 DM since  36 years of age.  Ms. Borden presents with her CGM.  Her  freestyle libre device was downloaded and analyzed.  AGP report shows 14% time in range, 46 send #1 hyperglycemia, 43% level 2 hyperglycemia.  She does not have hypoglycemia.  Her point-of-care A1c is 9.7%, improving from 10.7%.     Recent labs reviewed. -She presents with persistent hyperglycemia. -her diabetes is complicated by peripheral neuropathy, obesity/sedentary life, smoking and she remains at a high risk for more acute and chronic complications which include CAD, CVA, CKD, retinopathy, and neuropathy. These are all discussed in detail with her.  In light of her comorbid conditions of obesity, type 2 diabetes, hyperlipidemia, hypertension, she is a candidate for lifestyle medicine.  - she acknowledges that there is a room for improvement in her food and drink choices. - Suggestion is made for her to avoid simple carbohydrates  from her diet including Cakes, Sweet Desserts, Ice Cream, Soda (diet and regular), Sweet Tea, Candies, Chips, Cookies, Store Bought Juices, Alcohol , Artificial Sweeteners,  Coffee Creamer, and "Sugar-free" Products, Lemonade. This will help patient to have more stable blood glucose profile and potentially avoid unintended weight gain.  The following Lifestyle Medicine recommendations according to [place]  [facility]) were discussed and and offered to patient and she  agrees to start the journey:  A. Whole Foods,  Plant-Based Nutrition comprising of fruits and vegetables, plant-based proteins, whole-grain carbohydrates was discussed in detail with the patient.   A list for source of those nutrients were also provided to the patient.  Patient will use only water or unsweetened tea for hydration. B.  The need to stay away from risky substances including alcohol, smoking; obtaining 7 to 9 hours of restorative sleep, at least 150 minutes of moderate intensity exercise weekly, the importance of healthy social connections,  and stress management techniques were discussed. C.  A full color page of  Calorie density of various food groups per pound showing examples of each food groups was provided to the patient.    - she will be scheduled with Jearld Fenton, RDN, CDE for diabetes education.  - I have approached her with the following individualized plan to manage  her diabetes and patient agrees:   -Even while she is working on her lifestyle, based on  her chronic glycemic burden, she will continue to need intensive treatment with basal/bolus insulin in order for her to achieve control of diabetes to target.    -Accordingly, she is advised to continue Lantus 80 units nightly, continue NovoLog 24 -30  units 3 times daily AC for Premeal blood glucose readings above 90 mg per DL.  - she is warned not to take insulin without proper monitoring per orders.  - she is encouraged to call clinic for blood glucose levels less than 70 or above 200 mg /dl. - she is advised to continue glipizide 10 mg XL p.o. daily at breakfast. -She could not find Ozempic supply in the city.  Her insurance did not cover Mounjaro.  She is back on Januvia 100 mg p.o. daily at breakfast.  She is also advised to continue glipizide 10 mg XL p.o. daily at breakfast.  - Specific targets for  A1c;  LDL, HDL,  and Triglycerides were discussed with the patient.  2) Blood Pressure /Hypertension:  Her blood pressure is controlled to target.    3)  Lipids/Hyperlipidemia:   Review of her recent lipid panel showed  uncontrolled  LDL worsening at 125.    she is given options of treatment including initiation of statins.  I discussed and initiated Crestor 10 mg p.o. nightly.  Side effects and precautions discussed with her. The above described WF PB diet will address dyslipidemia as well.   She will be considered for fasting lipid panel on subsequent visits.    4)  Weight/Diet:  Body mass index is 53.48 kg/m.  -She presents with steady weight since last visit.  Her BMI is high, clearly complicating her diabetes care.   she is  a candidate for modest weight loss. I discussed with her the fact that loss of 5 - 10% of her  current body weight will have the most impact on her diabetes management.  Exercise, and detailed carbohydrates information provided  -  detailed on discharge instructions.  5) Chronic Care/Health Maintenance:  -she  is not on ACEI/ARB and Statin medications and  is encouraged to initiate and continue to follow up with Ophthalmology, Dentist,  Podiatrist at least yearly or according to recommendations, and advised to  quit smoking. I have recommended yearly flu vaccine and pneumonia vaccine at least every 5 years; moderate intensity exercise for up to 150 minutes weekly; and  sleep for at least 7 hours a day.  - she is  advised to maintain close follow up with [facility] for primary care needs, as well as her other providers for optimal and coordinated care.   I spent 41 minutes in the care of the patient today including review of labs from [facility], Lipids, Thyroid Function, Hematology (current and previous including abstractions from other facilities); face-to-face time discussing  her blood glucose readings/logs, discussing hypoglycemia and hyperglycemia episodes and symptoms, medications doses, her options of short and long term treatment based on the latest standards of care / guidelines;  discussion about  incorporating lifestyle medicine;  and documenting the encounter. Risk reduction counseling performed per USPSTF guidelines to reduce  obesity and cardiovascular risk factors.     Please refer to Patient Instructions for Blood Glucose Monitoring and Insulin/Medications Dosing Guide"  in media tab for additional information. Please  also refer to " Patient Self Inventory" in the Media  tab for reviewed elements of pertinent patient history.  Benjaman Pott participated in the discussions, expressed understanding, and voiced agreement with the above plans.  All questions were answered to her satisfaction. she is encouraged to contact clinic should she have any questions or concerns prior to her return visit.    Follow up plan: - Return in about 4 months (around 12/12/2022) for F/U with Pre-visit Labs, Meter/CGM/Logs, A1c here.  Glade Lloyd, MD [facility] [address] [place], [place] [address] Phone: [phone]  Fax: [phone]    08/11/2022, 1:59 PM  This note was partially dictated with voice recognition software. Similar sounding words can be transcribed inadequately or may not  be corrected upon review.

## 2022-08-18 ENCOUNTER — Ambulatory Visit: Payer: Medicaid Other | Admitting: Nutrition

## 2022-09-22 ENCOUNTER — Ambulatory Visit: Payer: Medicaid Other | Admitting: Nutrition

## 2022-09-22 IMAGING — CT CT MAXILLOFACIAL W/O CM
3 series · 15 of 47 positions shown, 18 images · non-contrast
Comparison: Head CT today.  Preoperative face CT 06/23/2017.

CLINICAL DATA: 35-year-old female status post syncope, falling
forward. Lip laceration. History of repaired frontal encephalocele.



[Series 3: max soft · axial · 0.37mm/px · z∈[+33,+187]mm · 9 of 91 slices shown, 12 images]
[im 7/91  brain]
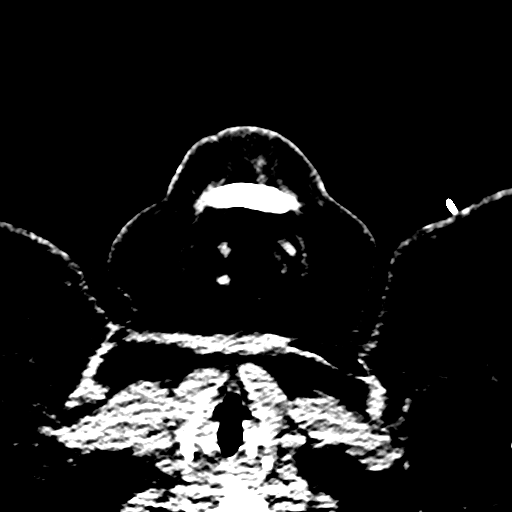
[im 7/91  bone]
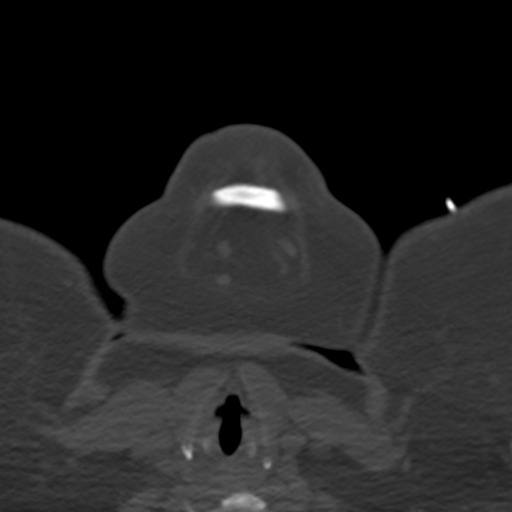
[im 16/91  bone]
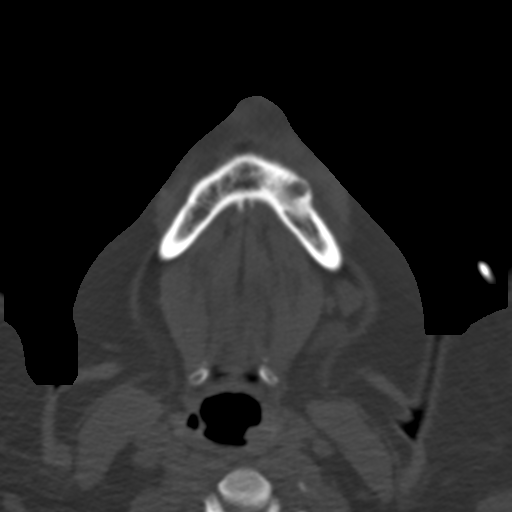
[im 25/91  bone]
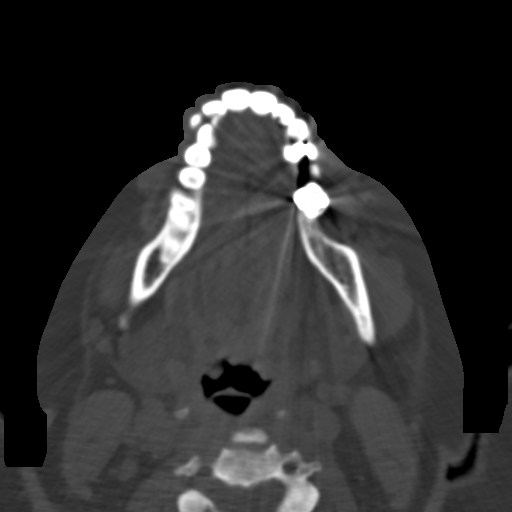
[im 35/91  bone]
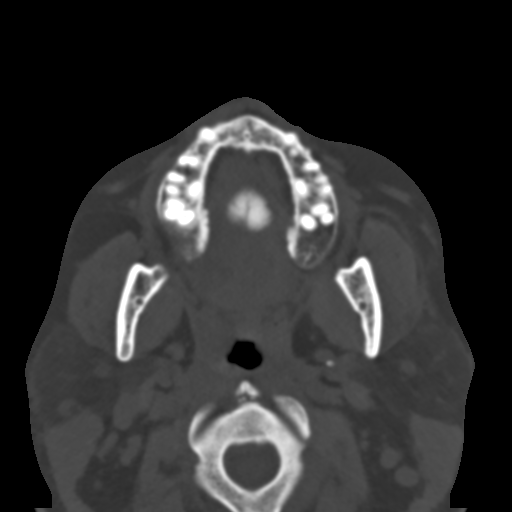
[im 47/91  brain]
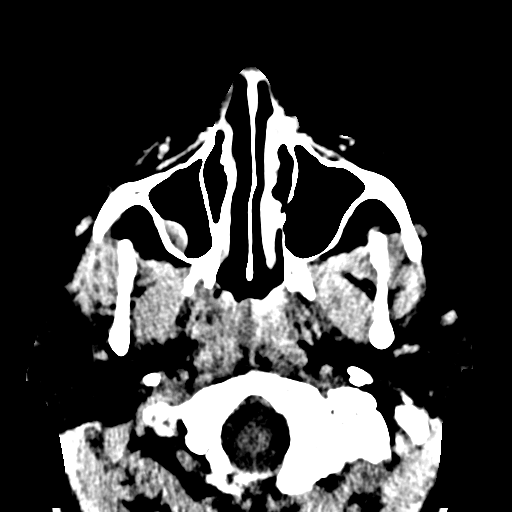
[im 47/91  bone]
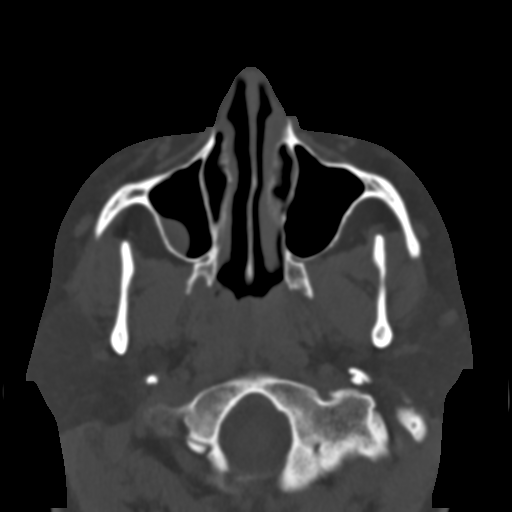
[im 56/91  bone]
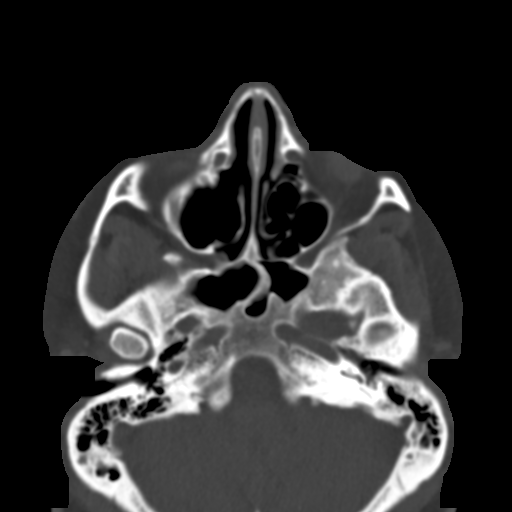
[im 66/91  bone]
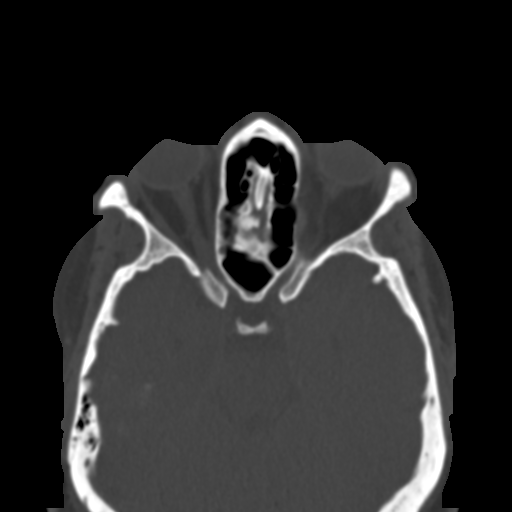
[im 75/91  bone]
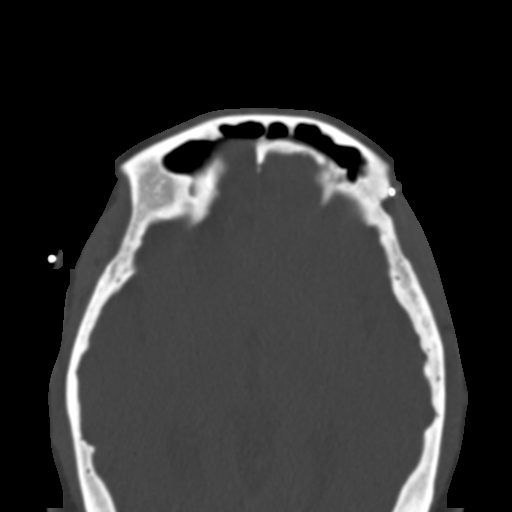
[im 84/91  brain]
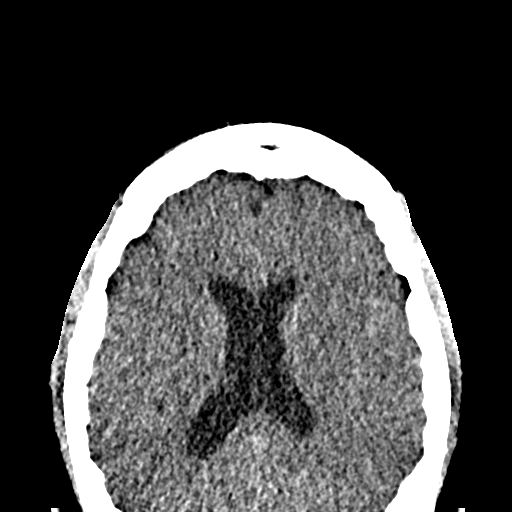
[im 84/91  bone]
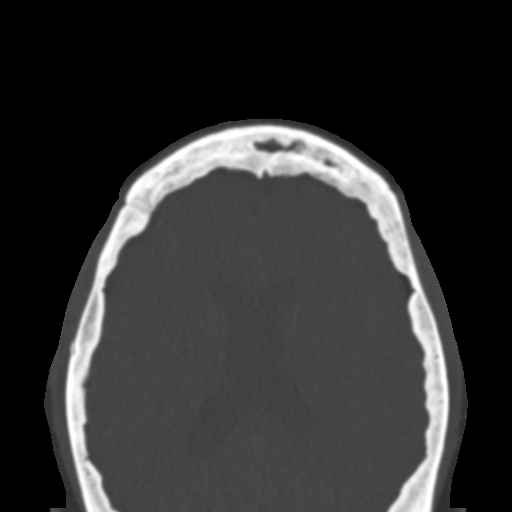

[Series 7: coronal soft · coronal · 0.38mm/px · 3 of 92 slices shown]
[im 31/92  bone]
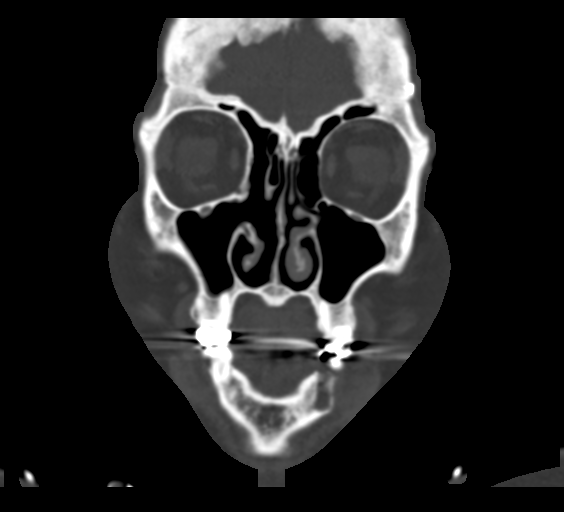
[im 41/92  bone]
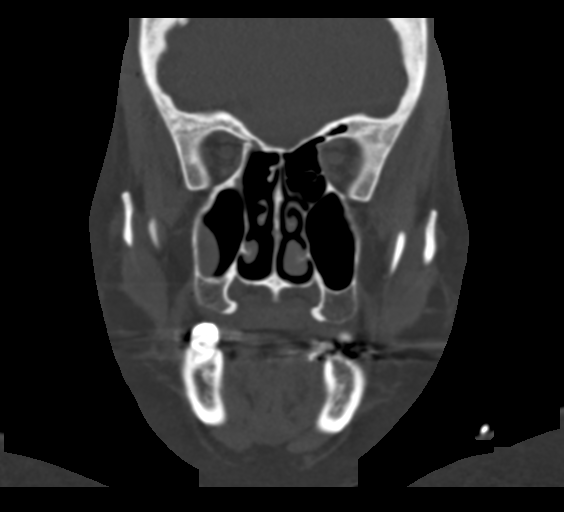
[im 51/92  bone]
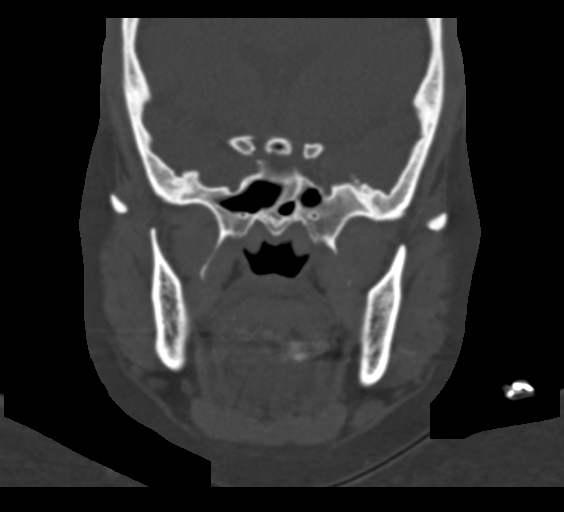

[Series 8: sagittal soft · sagittal · 0.37mm/px · 3 of 99 slices shown]
[im 33/99  bone]
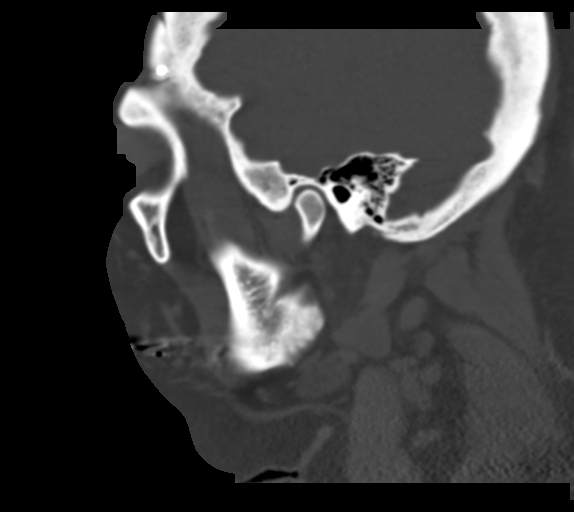
[im 50/99  bone]
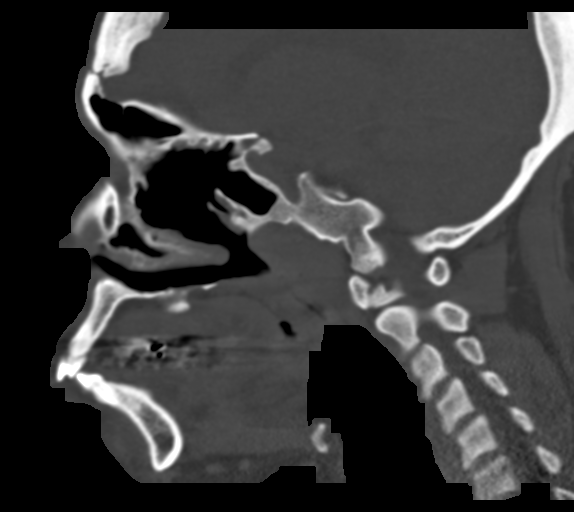
[im 66/99  bone]
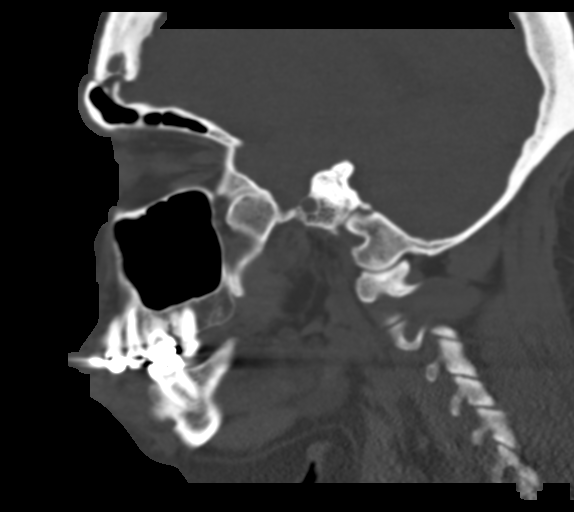

[15 of 47 positions shown; findings below may reference images not displayed]

FINDINGS: Osseous: Mandible intact and normally located. Left mandible
bicuspid dental extraction since 7303. Right maxillary bicuspid
extraction also. No acute dental finding identified.

Previous bilateral frontal craniotomy/cranioplasty. No acute osseous
abnormality identified.

Orbits: Intact orbital walls. Orbits soft tissues appears symmetric
and normal.

Sinuses: Postoperative changes to the right paranasal and frontal
sinuses with evidence of resolved right ethmoidal encephalocele and
relatively well aerated paranasal sinuses now. Mild mucosal
thickening or small retention cyst in the right maxillary alveolar
recess.

Tympanic cavities remain clear. Mild left mastoid effusion is stable
since 7303.

Soft tissues: Superficial soft tissue swelling at the lower lip. No
definite soft tissue gas.

No other superficial soft tissue injury identified.

Negative visible noncontrast deep soft tissue spaces of the face and
neck.

Limited intracranial: Reported separately today.
IMPRESSION: 1. Superficial soft tissue swelling at the lower lip. No other acute
traumatic injury identified in the Face.
2. Postoperative changes from repaired right frontoethmoidal
encephalocele. Well aerated paranasal sinuses now.

## 2022-10-06 ENCOUNTER — Other Ambulatory Visit: Payer: Self-pay | Admitting: "Endocrinology

## 2022-10-06 DIAGNOSIS — E1165 Type 2 diabetes mellitus with hyperglycemia: Secondary | ICD-10-CM

## 2022-10-20 ENCOUNTER — Telehealth: Payer: Self-pay

## 2022-10-20 NOTE — Telephone Encounter (Signed)
Left a message requesting pt return call to the office. ?

## 2022-10-26 ENCOUNTER — Other Ambulatory Visit: Payer: Self-pay | Admitting: "Endocrinology

## 2022-11-01 ENCOUNTER — Encounter: Payer: Self-pay | Admitting: Nutrition

## 2022-11-10 ENCOUNTER — Other Ambulatory Visit (HOSPITAL_COMMUNITY): Payer: Self-pay

## 2022-12-07 ENCOUNTER — Ambulatory Visit (INDEPENDENT_AMBULATORY_CARE_PROVIDER_SITE_OTHER): Payer: Medicaid Other | Admitting: Orthopedic Surgery

## 2022-12-07 ENCOUNTER — Encounter: Payer: Self-pay | Admitting: Orthopedic Surgery

## 2022-12-07 VITALS — BP 169/111 | HR 109 | Ht 67.5 in | Wt 325.0 lb

## 2022-12-07 DIAGNOSIS — M67432 Ganglion, left wrist: Secondary | ICD-10-CM | POA: Diagnosis not present

## 2022-12-07 DIAGNOSIS — Z01818 Encounter for other preprocedural examination: Secondary | ICD-10-CM | POA: Diagnosis not present

## 2022-12-07 DIAGNOSIS — M6740 Ganglion, unspecified site: Secondary | ICD-10-CM

## 2022-12-07 NOTE — Progress Notes (Signed)
Chief Complaint  Patient presents with   Wrist Pain    Left    New patient evaluation  This is a 37 year old female with a history of diabetes who presents with a painful left wrist and a knot on the radial side of the wrist near the first extensor compartment.  She does not have any pain with wrist ulnar deviation.  Review of systems she says she has a lot of cysts all other systems were reviewed and were negative  Patient Active Problem List   Diagnosis Date Noted   Non-adherence to medical treatment 03/01/2022   Bipolar 1 disorder ([place]) 05/06/2020   Uncontrolled type 2 diabetes mellitus with hyperglycemia ([place]) 01/21/2020   Essential hypertension, benign 01/21/2020   Mixed hyperlipidemia 01/21/2020   Pregnancy test positive 11/09/2018   Encounter for IUD removal 11/09/2018   Pelvic pain 11/09/2018   Encounter to determine fetal viability of pregnancy 11/09/2018   CSF leak 06/26/2017   Leukocytosis    Neck pain    PTSD (post-traumatic stress disorder)    Anxiety    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   Tobacco use 06/10/2016   Mastitis 05/24/2016   Left breast abscess 10/05/2014   Type 2 diabetes mellitus without complication ([place]) 123456   Hypokalemia 10/05/2014   Abscess of breast 10/05/2014   Depression 06/19/2014   Anemia 02/23/2014   Postpartum depression 12/23/2013   Preeclampsia 11/01/2013   Pelvic mass 04/15/2013   Morbid obesity ([place]) 04/15/2013   Morbid obesity with BMI of 50.0-59.9, adult ([place]) 04/15/2013    BP (!) 169/111   Pulse (!) 109   Ht 5' 7.5" (1.715 m)   Wt (!) 325 lb (147.4 kg)   BMI 50.15 kg/m   She is awake and alert and oriented x 3  Mood and affect are normal  General appearance is normal except for the BMI of 50  Pulse perfusion normal in the left upper extremity and right upper extremity  Evaluation of the left upper extremity and the cystic area not area appears to be either a ganglion cyst of the abductor to the thumb or wrist  joint ganglion cyst  She has normal sensation in the thumb index and long finger she has ulnar neuropathy in the left ring and small finger as well as neuropathy in her hands and feet  There is no deficit in flexion extension of the thumb IP joint or MP joint  Imaging outside imaging was on a disc it was normal report was also read as normal  I reviewed both of them and I have the same opinion of the image  The patient would like this removed.  There are certainly risks with removing this which were explained to her 1 radial dorsal sensory nerve is at risk.  Thumb extensor tendons and compartment #1 are also at risk.  Recurrence is also a risk.  Stiffness is a risk.  She would like to proceed with surgical excision of the cyst left wrist  Surgery will be scheduled for January 10, 2023

## 2022-12-07 NOTE — Progress Notes (Signed)
Patient left without her AVS mailed to her so she knows instructions/ phone number

## 2022-12-07 NOTE — Addendum Note (Signed)
Addended by: Obie Dredge A on: 12/07/2022 12:14 PM   Modules accepted: Orders

## 2022-12-07 NOTE — Patient Instructions (Signed)
Your surgery will be at [facility] by Dr Harrison  The hospital will contact you with a preoperative appointment to discuss Anesthesia.  Please arrive on time or 15 minutes early for the preoperative appointment, they have a very tight schedule if you are late or do not come in your surgery will be cancelled.  The phone number is [phone]. Please bring your medications with you for the appointment. They will tell you the arrival time and medication instructions when you have your preoperative evaluation. Do not wear nail polish the day of your surgery and if you take Phentermine you need to stop this medication ONE WEEK prior to your surgery. If you take Invokana, Farxiga, Jardiance, or Steglatro) - Hold 72 hours before the procedure.  If you take Ozempic,  Bydureon or Trulicity do not take for 8 days before your surgery. If you take Victoza, Rybelsis, Saxenda or Adlyxi stop 24 hours before the procedure.  Please arrive at the hospital 2 hours before procedure if scheduled at 9:30 or later in the day or at the time the nurse tells you at your preoperative visit.   If you have my chart do not use the time given in my chart use the time given to you by the nurse during your preoperative visit.   Your surgery  time may change. Please be available for phone calls the day of your surgery and the day before. The Short Stay department may need to discuss changes about your surgery time. Not reaching the you could lead to procedure delays and possible cancellation.  You must have a ride home and someone to stay with you for 24 to 48 hours. The person taking you home will receive and sign for the your discharge instructions.  Please be prepared to give your support person's name and telephone number to [facility]. Dr Harrison will need that name and phone number post procedure.   

## 2022-12-09 LAB — LIPID PANEL
Chol/HDL Ratio: 6.6 ratio — ABNORMAL HIGH (ref 0.0–4.4)
Cholesterol, Total: 224 mg/dL — ABNORMAL HIGH (ref 100–199)
HDL: 34 mg/dL — ABNORMAL LOW (ref >39)
LDL Chol Calc (NIH): 125 mg/dL — ABNORMAL HIGH (ref 0–99)
Triglycerides: 365 mg/dL — ABNORMAL HIGH (ref 0–149)
VLDL Cholesterol Cal: 65 mg/dL — ABNORMAL HIGH (ref 5–40)

## 2022-12-12 ENCOUNTER — Encounter: Payer: Self-pay | Admitting: "Endocrinology

## 2022-12-12 ENCOUNTER — Ambulatory Visit (INDEPENDENT_AMBULATORY_CARE_PROVIDER_SITE_OTHER): Payer: Medicaid Other | Admitting: "Endocrinology

## 2022-12-12 VITALS — BP 128/78 | HR 80 | Ht 67.5 in | Wt 324.6 lb

## 2022-12-12 DIAGNOSIS — E782 Mixed hyperlipidemia: Secondary | ICD-10-CM

## 2022-12-12 DIAGNOSIS — E1165 Type 2 diabetes mellitus with hyperglycemia: Secondary | ICD-10-CM | POA: Diagnosis not present

## 2022-12-12 LAB — POCT GLYCOSYLATED HEMOGLOBIN (HGB A1C): HbA1c, POC (controlled diabetic range): 8.8 % — AB (ref 0.0–7.0)

## 2022-12-12 MED ORDER — DEXCOM G7 SENSOR MISC: 2 refills | Status: DC

## 2022-12-12 MED ORDER — DEXCOM G7 RECEIVER DEVI: 0 refills | Status: DC

## 2022-12-12 NOTE — Progress Notes (Signed)
12/12/2022, 6:43 PM  Endocrinology follow-up note   Subjective:    Patient ID: Judy Nelson, female    DOB: Oct 03, 1986.  Judy Nelson is being seen in follow-up after she was seen in consultation for management of currently uncontrolled symptomatic diabetes requested by  Lanelle Bal, PA-C.  Patient was seen in April 2021 in consult for type 2 diabetes.  She did not return for follow-up.  Past Medical History:  Diagnosis Date   Anxiety    CSF leak    Depression    Heartburn 09/01/2015   no current med.   History of anemia 01/2014   Non-insulin dependent type 2 diabetes mellitus ([place])    Obesity    PTSD (post-traumatic stress disorder)    Scar of breast 08/2015   left   Sinus headache     Past Surgical History:  Procedure Laterality Date   BREAST SURGERY Bilateral    bilateral breast surgery to remove cyst at [facility] 2017   CRANIOTOMY N/A 06/30/2017   Procedure: BIFRONTAL CRANIOTOMY;  Surgeon: Consuella Lose, MD;  Location: [facility];  Service: Neurosurgery;  Laterality: N/A;  BIFRONTAL CRANIOTOMY Repair of Encephalocele   INCISION AND DRAINAGE ABSCESS Left 10/07/2014   Procedure: INCISION AND DRAINAGE LEFT BREAST ABSCESS;  Surgeon: Jamesetta So, MD;  Location: [facility];  Service: General;  Laterality: Left;   LAPAROSCOPIC OVARIAN CYSTECTOMY Right 02/05/2014   Procedure: LAPAROSCOPIC right retroperitoneal (NOT OVARIAN) CYSTECTOMY;  Surgeon: Florian Buff, MD;  Location: [facility];  Service: Gynecology;  Laterality: Right;   MASS EXCISION Left 09/07/2015   Procedure: Minor SCAR EXCISION LEFT BREAST, PLASTIC CLOSURE;  Surgeon: Cristine Polio, MD;  Location: [facility];  Service: Plastics;  Laterality: Left;   TYMPANOSTOMY TUBE PLACEMENT Bilateral 10/17/2014    Social History   Socioeconomic History   Marital status: Single    Spouse name: Not on file   Number of children: Not on file    Years of education: Not on file   Highest education level: Not on file  Occupational History   Not on file  Tobacco Use   Smoking status: Every Day    Packs/day: 0.50    Years: 7.00    Total pack years: 3.50    Types: Cigarettes   Smokeless tobacco: Never  Vaping Use   Vaping Use: Never used  Substance and Sexual Activity   Alcohol use: Yes    Comment: occasionally   Drug use: No   Sexual activity: Yes    Birth control/protection: None  Other Topics Concern   Not on file  Social History Narrative   Not on file   Social Determinants of Health   Financial Resource Strain: Not on file  Food Insecurity: Not on file  Transportation Needs: Not on file  Physical Activity: Not on file  Stress: Not on file  Social Connections: Not on file    Family History  Problem Relation Age of Onset   Diabetes Father    Hypertension Father    Heart disease Daughter        heart murmur   Cancer Maternal Grandmother        ovarian cancer   Obesity Paternal Grandmother    Diabetes  Paternal Grandfather     Outpatient Encounter Medications as of 12/12/2022  Medication Sig   Continuous Blood Gluc Receiver (DEXCOM G7 RECEIVER) DEVI Use to monitor BG continuously   Continuous Blood Gluc Sensor (DEXCOM G7 SENSOR) MISC Change sensor every 10 days   albuterol (PROVENTIL HFA;VENTOLIN HFA) 108 (90 Base) MCG/ACT inhaler Inhale 2 puffs into the lungs every 6 (six) hours as needed for wheezing or shortness of breath.   ALPRAZolam (XANAX) 1 MG tablet TAKE 1 TABLET BY MOUTH THREE TIMES DAILY   amphetamine-dextroamphetamine (ADDERALL XR) 30 MG 24 hr capsule Take 30 mg by mouth 2 (two) times daily as needed.   DULoxetine (CYMBALTA) 60 MG capsule Take 60 mg by mouth daily.   gabapentin (NEURONTIN) 800 MG tablet Take 800 mg by mouth 2 (two) times daily.   glipiZIDE (GLUCOTROL XL) 10 MG 24 hr tablet Take 10 mg by mouth daily with breakfast.   HYDROXYZINE HCL PO Take 50 mg by mouth at bedtime.   insulin  glargine (LANTUS) 100 UNIT/ML injection Inject 80 Units into the skin at bedtime.   NOVOLOG FLEXPEN 100 UNIT/ML FlexPen INJECT 24 TO 30 UNITS INTO THE SKIN THREE TIMES DAILY BEFORE MEALS   oxyCODONE-acetaminophen (PERCOCET) 10-325 MG tablet Take 1 tablet by mouth every 4 (four) hours as needed for pain.   OZEMPIC, 1 MG/DOSE, 4 MG/3ML SOPN INJECT 1 MG INTO THE SKIN ONCE A WEEK   rosuvastatin (CRESTOR) 10 MG tablet Take 1 tablet (10 mg total) by mouth daily.   silver sulfADIAZINE (SILVADENE) 1 % cream Use to area 2-3 times daily   solifenacin (VESICARE) 10 MG tablet Take 1 tablet (10 mg total) by mouth daily.   [DISCONTINUED] Continuous Blood Gluc Receiver (FREESTYLE LIBRE 2 READER) DEVI As directed   [DISCONTINUED] Continuous Blood Gluc Sensor (FREESTYLE LIBRE 2 SENSOR) MISC 1 Piece by Does not apply route every 14 (fourteen) days.   [DISCONTINUED] JANUVIA 100 MG tablet Take 100 mg by mouth daily. (Patient not taking: Reported on 12/12/2022)   No facility-administered encounter medications on file as of 12/12/2022.    ALLERGIES: Allergies  Allergen Reactions   Penicillin G     VACCINATION STATUS: Immunization History  Administered Date(s) Administered   Influenza,inj,Quad PF,6+ Mos 07/16/2013   Tdap 11/04/2013, 11/16/2021    Diabetes She presents for her follow-up diabetic visit. She has type 2 diabetes mellitus. Onset time: She was diagnosed at approximate age of 9 years. Her disease course has been improving. There are no hypoglycemic associated symptoms. Pertinent negatives for hypoglycemia include no confusion, headaches, pallor or seizures. Associated symptoms include fatigue, polydipsia and polyuria. Pertinent negatives for diabetes include no blurred vision, no chest pain and no polyphagia. There are no hypoglycemic complications. Symptoms are improving. Diabetic complications include peripheral neuropathy. Risk factors for coronary artery disease include dyslipidemia, diabetes  mellitus, obesity, sedentary lifestyle, tobacco exposure and family history. Current diabetic treatment includes insulin injections. Her weight is decreasing steadily. She is following a generally unhealthy diet. When asked about meal planning, she reported none. She has not had a previous visit with a dietitian. She never participates in exercise. Her home blood glucose trend is decreasing steadily. Her overall blood glucose range is 180-200 mg/dl. (Judy Nelson presents without any meter nor suggest.  She says that her insurance stopped paying for her freestyle libre.  Her point-of-care A1c was 8.8%, improving from 10.7% overall.  She denies hypoglycemia.         ) An ACE inhibitor/angiotensin II receptor  blocker is not being taken.  Hyperlipidemia This is a chronic problem. The current episode started more than 1 year ago. The problem is uncontrolled. Exacerbating diseases include diabetes. Factors aggravating her hyperlipidemia include smoking. Pertinent negatives include no chest pain, myalgias or shortness of breath. She is currently on no antihyperlipidemic treatment. Risk factors for coronary artery disease include diabetes mellitus, dyslipidemia, family history, obesity and a sedentary lifestyle.  Hypertension This is a chronic problem. The problem is controlled. Pertinent negatives include no blurred vision, chest pain, headaches, palpitations or shortness of breath. Risk factors for coronary artery disease include dyslipidemia, diabetes mellitus, smoking/tobacco exposure, sedentary lifestyle and family history. Past treatments include calcium channel blockers.     Review of Systems  Constitutional:  Positive for fatigue. Negative for chills, fever and unexpected weight change.  HENT:  Negative for trouble swallowing and voice change.   Eyes:  Negative for blurred vision and visual disturbance.  Respiratory:  Negative for cough, shortness of breath and wheezing.   Cardiovascular:  Negative  for chest pain, palpitations and leg swelling.  Gastrointestinal:  Negative for diarrhea, nausea and vomiting.  Endocrine: Positive for polydipsia and polyuria. Negative for cold intolerance, heat intolerance and polyphagia.  Musculoskeletal:  Negative for arthralgias and myalgias.  Skin:  Negative for color change, pallor, rash and wound.  Neurological:  Negative for seizures and headaches.  Psychiatric/Behavioral:  Negative for confusion and suicidal ideas.     Objective:       12/12/2022    2:32 PM 12/07/2022   11:41 AM 08/11/2022    1:03 PM  Vitals with BMI  Height 5' 7.5" 5' 7.5" 5' 7.5"  Weight 324 lbs 10 oz 325 lbs 346 lbs 10 oz  BMI 50.06 [id number]  Systolic 0000000 [id number]  Diastolic 78 [id number] 78  Pulse 80 109 80    BP 128/78   Pulse 80   Ht 5' 7.5" (1.715 m)   Wt (!) 324 lb 9.6 oz (147.2 kg)   BMI 50.09 kg/m   Wt Readings from Last 3 Encounters:  12/12/22 (!) 324 lb 9.6 oz (147.2 kg)  12/07/22 (!) 325 lb (147.4 kg)  08/11/22 (!) 346 lb 9.6 oz (157.2 kg)       CMP ( most recent) CMP     Component Value Date/Time   NA 136 08/10/2022 1208   K 4.2 08/10/2022 1208   CL 98 08/10/2022 1208   CO2 25 08/10/2022 1208   GLUCOSE 224 (H) 08/10/2022 1208   GLUCOSE 323 (H) 11/16/2021 0902   BUN 6 08/10/2022 1208   CREATININE 0.71 08/10/2022 1208   CREATININE 0.44 (L) 10/22/2013 1247   CALCIUM 10.0 08/10/2022 1208   PROT 7.4 08/10/2022 1208   ALBUMIN 3.8 (L) 08/10/2022 1208   AST 26 08/10/2022 1208   ALT 25 08/10/2022 1208   ALKPHOS 127 (H) 08/10/2022 1208   BILITOT <0.2 08/10/2022 1208   GFRNONAA >60 11/16/2021 0902   GFRAA >60 03/12/2018 1315     Diabetic Labs (most recent): Lab Results  Component Value Date   HGBA1C 8.8 (A) 12/12/2022   HGBA1C 9.7 (A) 08/11/2022   HGBA1C 10.7 (A) 05/12/2022     Lipid Panel ( most recent) Lipid Panel     Component Value Date/Time   CHOL 224 (H) 12/08/2022 1223   TRIG 365 (H) 12/08/2022 1223   HDL 34 (L) 12/08/2022  1223   CHOLHDL 6.6 (H) 12/08/2022 1223   LDLCALC 125 (H) 12/08/2022 1223   LABVLDL  65 (H) 12/08/2022 1223     Lab Results  Component Value Date   TSH 0.910 08/10/2022   TSH 1.41 02/09/2022   TSH 1.580 07/20/2015   TSH 1.250 10/05/2014   FREET4 1.00 08/10/2022      Assessment & Plan:   1. Uncontrolled type 2 diabetes mellitus with hyperglycemia ([place])  - Judy Nelson has currently uncontrolled symptomatic type 2 DM since  37 years of age.  Judy Nelson presents without any meter nor suggest.  She says that her insurance stopped paying for her freestyle libre.  Her point-of-care A1c was 8.8%, improving from 10.7% overall.  She denies hypoglycemia.     Recent labs reviewed. -She presents with persistent hyperglycemia. -her diabetes is complicated by peripheral neuropathy, obesity/sedentary life, smoking and she remains at a high risk for more acute and chronic complications which include CAD, CVA, CKD, retinopathy, and neuropathy. These are all discussed in detail with her.  In light of her comorbid conditions of obesity, type 2 diabetes, hyperlipidemia, hypertension, she is a candidate for lifestyle medicine.  - she acknowledges that there is a room for improvement in her food and drink choices. - Suggestion is made for her to avoid simple carbohydrates  from her diet including Cakes, Sweet Desserts, Ice Cream, Soda (diet and regular), Sweet Tea, Candies, Chips, Cookies, Store Bought Juices, Alcohol , Artificial Sweeteners,  Coffee Creamer, and "Sugar-free" Products, Lemonade. This will help patient to have more stable blood glucose profile and potentially avoid unintended weight gain.  The following Lifestyle Medicine recommendations according to [facility]) were discussed and and offered to patient and she  agrees to start the journey:  A. Whole Foods, Plant-Based Nutrition comprising of fruits and vegetables, plant-based proteins, whole-grain  carbohydrates was discussed in detail with the patient.   A list for source of those nutrients were also provided to the patient.  Patient will use only water or unsweetened tea for hydration. B.  The need to stay away from risky substances including alcohol, smoking; obtaining 7 to 9 hours of restorative sleep, at least 150 minutes of moderate intensity exercise weekly, the importance of healthy social connections,  and stress management techniques were discussed. C.  A full color page of  Calorie density of various food groups per pound showing examples of each food groups was provided to the patient.   - she will be scheduled with Judy Nelson, RDN, CDE for diabetes education.  - I have approached her with the following individualized plan to manage  her diabetes and patient agrees:   -Even while she is working on her lifestyle, based on  her chronic glycemic burden, she will continue to need intensive treatment with basal/bolus insulin  in order for her to achieve control of diabetes to target.    -Accordingly, she is advised to continue Lantus 80 units nightly, lower NovoLog to 20 -26  units 3 times daily AC for Premeal blood glucose readings above 90 mg per DL.  - she is warned not to take insulin without proper monitoring per orders.  - she is encouraged to call clinic for blood glucose levels less than 70 or above 200 mg /dl. - she is advised to continue glipizide 10 mg XL p.o. daily at breakfast. -She is benefiting from [place].  She is advised to continue Ozempic 1 mg subcutaneously weekly.   -This patient will greatly benefit from a CGM.  I discussed and prescribed the Dexcom device for her. -  Specific targets for  A1c;  LDL, HDL,  and Triglycerides were discussed with the patient.  2) Blood Pressure /Hypertension:  -Her blood pressure is controlled to target.  3) Lipids/Hyperlipidemia:   Review of her recent lipid panel showed  uncontrolled  LDL worsening at 125.   She is  encouraged to continue Crestor 10 g p.o. nightly. Side effects and precautions discussed with her. The above described WF PB diet will address dyslipidemia as well.     4)  Weight/Diet:  Body mass index is 50.09 kg/m.  -She presents with steady weight since last visit.  Her BMI is high, clearly complicating her diabetes care.   she is  a candidate for modest weight loss. I discussed with her the fact that loss of 5 - 10% of her  current body weight will have the most impact on her diabetes management.  Exercise, and detailed carbohydrates information provided  -  detailed on discharge instructions.  5) Chronic Care/Health Maintenance:  -she  is not on ACEI/ARB and Statin medications and  is encouraged to initiate and continue to follow up with Ophthalmology, Dentist,  Podiatrist at least yearly or according to recommendations, and advised to  quit smoking. I have recommended yearly flu vaccine and pneumonia vaccine at least every 5 years; moderate intensity exercise for up to 150 minutes weekly; and  sleep for at least 7 hours a day.  - she is  advised to maintain close follow up with Lanelle Bal, PA-C for primary care needs, as well as her other providers for optimal and coordinated care.  I spent  26  minutes in the care of the patient today including review of labs from [facility], Lipids, Thyroid Function, Hematology (current and previous including abstractions from other facilities); face-to-face time discussing  her blood glucose readings/logs, discussing hypoglycemia and hyperglycemia episodes and symptoms, medications doses, her options of short and long term treatment based on the latest standards of care / guidelines;  discussion about incorporating lifestyle medicine;  and documenting the encounter. Risk reduction counseling performed per USPSTF guidelines to reduce  obesity and cardiovascular risk factors.     Please refer to Patient Instructions for Blood Glucose Monitoring and  Insulin/Medications Dosing Guide"  in media tab for additional information. Please  also refer to " Patient Self Inventory" in the Media  tab for reviewed elements of pertinent patient history.  Judy Nelson participated in the discussions, expressed understanding, and voiced agreement with the above plans.  All questions were answered to her satisfaction. she is encouraged to contact clinic should she have any questions or concerns prior to her return visit.     Follow up plan: - Return in about 3 months (around 03/12/2023) for F/U with Pre-visit Labs, Meter/CGM/Logs, A1c here.  Glade Lloyd, MD [facility] [address] [place], [place] [address] Phone: [phone]  Fax: [phone]    12/12/2022, 6:43 PM  This note was partially dictated with voice recognition software. Similar sounding words can be transcribed inadequately or may not  be corrected upon review.

## 2022-12-12 NOTE — Patient Instructions (Signed)

## 2022-12-15 ENCOUNTER — Encounter: Payer: Self-pay | Admitting: Radiology

## 2022-12-30 ENCOUNTER — Other Ambulatory Visit (HOSPITAL_COMMUNITY): Payer: Self-pay

## 2023-01-02 NOTE — Patient Instructions (Signed)
Judy Nelson  01/02/2023     @PREFPERIOPPHARMACY @   Your procedure is scheduled on  01/06/2023.   Report to Judy Nelson at  [place].M.   Call this number if you have problems the morning of surgery:  [phone]  If you experience any cold or flu symptoms such as cough, fever, chills, shortness of breath, etc. between now and your scheduled surgery, please notify us at the above number.   Remember:  Do not eat or drink after midnight.               Your last dose of Ozempic should have been on 12/29/2022.      Take 40 units of lantus the night before your procedure.     DO NOT take any medications for diabetes the morning of your procedure.        Use your inhaler before you come and bring your rescue inhaler with you.    Take these medicines the morning of surgery with A SIP OF WATER          xanax(if needed), cymbalta, gabapentin, oxycodone(if needed).     Do not wear jewelry, make-up or nail polish.  Do not wear lotions, powders, or perfumes, or deodorant.  Do not shave 48 hours prior to surgery.  Men may shave face and neck.  Do not bring valuables to the hospital.  [facility] is not responsible for any belongings or valuables.  Contacts, dentures or bridgework may not be worn into surgery.  Leave your suitcase in the car.  After surgery it may be brought to your room.  For patients admitted to the hospital, discharge time will be determined by your treatment team.  Patients discharged the day of surgery will not be allowed to drive home and must have someone with them for 24 hours.    Special instructions:   DO NOT smoke tobacco or vape for 24 hours before your procedure.  Please read over the following fact sheets that you were given. Coughing and Deep Breathing, Surgical Site Infection Prevention, Anesthesia Post-op Instructions, and Care and Recovery After Surgery       Ganglion Cyst Removal, Care After This sheet gives you information  about how to care for yourself after your procedure. Your health care provider may also give you more specific instructions. If you have problems or questions, contact your health care provider. What can I expect after the procedure? After the procedure, it is common to have: Soreness. Swelling. Stiffness. A splint or brace. Follow these instructions at home: Medicines Take over-the-counter and prescription medicines only as told by your health care provider. Ask your health care provider if the medicine prescribed to you requires you to avoid driving or using machinery. Incision care  Follow instructions from your health care provider about how to take care of your incision or incisions. Make sure you: Wash your hands with soap and water for at least 20 seconds before and after you change your bandage (dressing). If soap and water are not available, use hand sanitizer. Change and remove your dressing as told by your health care provider. Leave stitches (sutures), skin glue, or adhesive strips in place. These skin closures may need to stay in place for 2 weeks or longer. If adhesive strip edges start to loosen and curl up, you may trim the loose edges. Do not remove adhesive strips completely unless your health care provider tells you to do that. Check  your incision area every day for signs of infection. Check for: Redness, swelling, or pain. Fluid or blood. Warmth. Pus or a bad smell. Do not take baths, swim, or use a hot tub until your health care provider approves. If you have a splint or brace: Wear it as told by your health care provider. Remove it only as told by your health care provider. You may need to wear the splint or brace for several days. Loosen the splint or brace if your fingers (or toes) tingle, become numb, or turn cold and blue. Keep the splint or brace clean. If the splint or brace is not waterproof: Do not let it get wet. Ask if you can remove it for bathing. If  not, cover it with a watertight covering when you take a bath or shower. Managing pain, stiffness, and swelling  If directed, put ice on the incision area. To do this: If you have a removable splint or brace, remove it as told by your health care provider. Put ice in a plastic bag. Place a towel between your skin and the bag or between the splint or brace and the bag. Leave the ice on for 20 minutes, 2-3 times a day. Move your fingers (or toes) often to reduce stiffness and swelling. Raise (elevate) the affected area above the level of your heart while you are sitting or lying down. Activity Return to your normal activities as told by your health care provider. Ask your health care provider what activities are safe for you. Avoid activities that cause pain. Ask your health care provider when it is safe to drive and when you should start doing movement exercises. General instructions Do not use any products that contain nicotine or tobacco, such as cigarettes, e-cigarettes, and chewing tobacco. These can delay healing. If you need help quitting, ask your health care provider. Keep all follow-up visits as told by your health care provider. This is important. Contact a health care provider if: Medicine is not relieving your pain. You have stiffness or swelling that gets worse. Your splint or brace is not fitting correctly, causing your fingers (or toes) to tingle, become numb, or turn cold and blue. You have any signs of infection at your incision site. You have a fever. Summary After the procedure, you can expect to have some soreness, stiffness, and swelling. You may need to wear a splint or brace for a few days. Follow instructions for changing your dressing and checking your incision area for signs of infection. Contact your health care provider if you have a fever, signs of infection, stiffness or swelling that gets worse, or continuing pain, tingling, or numbness. This information is  not intended to replace advice given to you by your health care provider. Make sure you discuss any questions you have with your health care provider. Document Revised: 12/27/2019 Document Reviewed: 12/27/2019 Elsevier Patient Education  [place] After The following information offers guidance on how to care for yourself after your procedure. Your health care provider may also give you more specific instructions. If you have problems or questions, contact your health care provider. What can I expect after the procedure? After the procedure, it is common to have: Tiredness. Little or no memory about what happened during or after the procedure. Impaired judgment when it comes to making decisions. Nausea or vomiting. Some trouble with balance. Follow these instructions at home: For the time period you were told by your health care provider:  Rest. Do not participate in activities where you could fall or become injured. Do not drive or use machinery. Do not drink alcohol. Do not take sleeping pills or medicines that cause drowsiness. Do not make important decisions or sign legal documents. Do not take care of children on your own. Medicines Take over-the-counter and prescription medicines only as told by your health care provider. If you were prescribed antibiotics, take them as told by your health care provider. Do not stop using the antibiotic even if you start to feel better. Eating and drinking Follow instructions from your health care provider about what you may eat and drink. Drink enough fluid to keep your urine pale yellow. If you vomit: Drink clear fluids slowly and in small amounts as you are able. Clear fluids include water, ice chips, low-calorie sports drinks, and fruit juice that has water added to it (diluted fruit juice). Eat light and bland foods in small amounts as you are able. These foods include bananas, applesauce, rice, lean  meats, toast, and crackers. General instructions  Have a responsible adult stay with you for the time you are told. It is important to have someone help care for you until you are awake and alert. If you have sleep apnea, surgery and some medicines can increase your risk for breathing problems. Follow instructions from your health care provider about wearing your sleep device: When you are sleeping. This includes during daytime naps. While taking prescription pain medicines, sleeping medicines, or medicines that make you drowsy. Do not use any products that contain nicotine or tobacco. These products include cigarettes, chewing tobacco, and vaping devices, such as e-cigarettes. If you need help quitting, ask your health care provider. Contact a health care provider if: You feel nauseous or vomit every time you eat or drink. You feel light-headed. You are still sleepy or having trouble with balance after 24 hours. You get a rash. You have a fever. You have redness or swelling around the IV site. Get help right away if: You have trouble breathing. You have new confusion after you get home. These symptoms may be an emergency. Get help right away. Call 911. Do not wait to see if the symptoms will go away. Do not drive yourself to the hospital. This information is not intended to replace advice given to you by your health care provider. Make sure you discuss any questions you have with your health care provider. Document Revised: 02/28/2022 Document Reviewed: 02/28/2022 Elsevier Patient Education  [place]. How to Use Chlorhexidine Before Surgery Chlorhexidine gluconate (CHG) is a germ-killing (antiseptic) solution that is used to clean the skin. It can get rid of the bacteria that normally live on the skin and can keep them away for about 24 hours. To clean your skin with CHG, you may be given: A CHG solution to use in the shower or as part of a sponge bath. A prepackaged cloth that  contains CHG. Cleaning your skin with CHG may help lower the risk for infection: While you are staying in the intensive care unit of the hospital. If you have a vascular access, such as a central line, to provide short-term or long-term access to your veins. If you have a catheter to drain urine from your bladder. If you are on a ventilator. A ventilator is a machine that helps you breathe by moving air in and out of your lungs. After surgery. What are the risks? Risks of using CHG include: A skin reaction. Hearing loss,  if CHG gets in your ears and you have a perforated eardrum. Eye injury, if CHG gets in your eyes and is not rinsed out. The CHG product catching fire. Make sure that you avoid smoking and flames after applying CHG to your skin. Do not use CHG: If you have a chlorhexidine allergy or have previously reacted to chlorhexidine. On babies younger than 79 months of age. How to use CHG solution Use CHG only as told by your health care provider, and follow the instructions on the label. Use the full amount of CHG as directed. Usually, this is one bottle. During a shower Follow these steps when using CHG solution during a shower (unless your health care provider gives you different instructions): Start the shower. Use your normal soap and shampoo to wash your face and hair. Turn off the shower or move out of the shower stream. Pour the CHG onto a clean washcloth. Do not use any type of brush or rough-edged sponge. Starting at your neck, lather your body down to your toes. Make sure you follow these instructions: If you will be having surgery, pay special attention to the part of your body where you will be having surgery. Scrub this area for at least 1 minute. Do not use CHG on your head or face. If the solution gets into your ears or eyes, rinse them well with water. Avoid your genital area. Avoid any areas of skin that have broken skin, cuts, or scrapes. Scrub your back and  under your arms. Make sure to wash skin folds. Let the lather sit on your skin for 1-2 minutes or as long as told by your health care provider. Thoroughly rinse your entire body in the shower. Make sure that all body creases and crevices are rinsed well. Dry off with a clean towel. Do not put any substances on your body afterward--such as powder, lotion, or perfume--unless you are told to do so by your health care provider. Only use lotions that are recommended by the manufacturer. Put on clean clothes or pajamas. If it is the night before your surgery, sleep in clean sheets.  During a sponge bath Follow these steps when using CHG solution during a sponge bath (unless your health care provider gives you different instructions): Use your normal soap and shampoo to wash your face and hair. Pour the CHG onto a clean washcloth. Starting at your neck, lather your body down to your toes. Make sure you follow these instructions: If you will be having surgery, pay special attention to the part of your body where you will be having surgery. Scrub this area for at least 1 minute. Do not use CHG on your head or face. If the solution gets into your ears or eyes, rinse them well with water. Avoid your genital area. Avoid any areas of skin that have broken skin, cuts, or scrapes. Scrub your back and under your arms. Make sure to wash skin folds. Let the lather sit on your skin for 1-2 minutes or as long as told by your health care provider. Using a different clean, wet washcloth, thoroughly rinse your entire body. Make sure that all body creases and crevices are rinsed well. Dry off with a clean towel. Do not put any substances on your body afterward--such as powder, lotion, or perfume--unless you are told to do so by your health care provider. Only use lotions that are recommended by the manufacturer. Put on clean clothes or pajamas. If it is the night before  your surgery, sleep in clean sheets. How to use  CHG prepackaged cloths Only use CHG cloths as told by your health care provider, and follow the instructions on the label. Use the CHG cloth on clean, dry skin. Do not use the CHG cloth on your head or face unless your health care provider tells you to. When washing with the CHG cloth: Avoid your genital area. Avoid any areas of skin that have broken skin, cuts, or scrapes. Before surgery Follow these steps when using a CHG cloth to clean before surgery (unless your health care provider gives you different instructions): Using the CHG cloth, vigorously scrub the part of your body where you will be having surgery. Scrub using a back-and-forth motion for 3 minutes. The area on your body should be completely wet with CHG when you are done scrubbing. Do not rinse. Discard the cloth and let the area air-dry. Do not put any substances on the area afterward, such as powder, lotion, or perfume. Put on clean clothes or pajamas. If it is the night before your surgery, sleep in clean sheets.  For general bathing Follow these steps when using CHG cloths for general bathing (unless your health care provider gives you different instructions). Use a separate CHG cloth for each area of your body. Make sure you wash between any folds of skin and between your fingers and toes. Wash your body in the following order, switching to a new cloth after each step: The front of your neck, shoulders, and chest. Both of your arms, under your arms, and your hands. Your stomach and groin area, avoiding the genitals. Your right leg and foot. Your left leg and foot. The back of your neck, your back, and your buttocks. Do not rinse. Discard the cloth and let the area air-dry. Do not put any substances on your body afterward--such as powder, lotion, or perfume--unless you are told to do so by your health care provider. Only use lotions that are recommended by the manufacturer. Put on clean clothes or pajamas. Contact a health  care provider if: Your skin gets irritated after scrubbing. You have questions about using your solution or cloth. You swallow any chlorhexidine. Call your local poison control center (1-863-273-6264 in the [place] Get help right away if: Your eyes itch badly, or they become very red or swollen. Your skin itches badly and is red or swollen. Your hearing changes. You have trouble seeing. You have swelling or tingling in your mouth or throat. You have trouble breathing. These symptoms may represent a serious problem that is an emergency. Do not wait to see if the symptoms will go away. Get medical help right away. Call your local emergency services (911 in the [place] Do not drive yourself to the hospital. Summary Chlorhexidine gluconate (CHG) is a germ-killing (antiseptic) solution that is used to clean the skin. Cleaning your skin with CHG may help to lower your risk for infection. You may be given CHG to use for bathing. It may be in a bottle or in a prepackaged cloth to use on your skin. Carefully follow your health care provider's instructions and the instructions on the product label. Do not use CHG if you have a chlorhexidine allergy. Contact your health care provider if your skin gets irritated after scrubbing. This information is not intended to replace advice given to you by your health care provider. Make sure you discuss any questions you have with your health care provider. Document Revised: 01/31/2022 Document Reviewed: 12/14/2020 Elsevier  Patient Education  2023 [employer] Inc[employer]  

## 2023-01-04 ENCOUNTER — Encounter (HOSPITAL_COMMUNITY)
Admission: RE | Admit: 2023-01-04 | Discharge: 2023-01-04 | Disposition: A | Discharge status: Home / Self Care | Payer: Medicaid Other | Source: Ambulatory Visit | Attending: Orthopedic Surgery | Admitting: Orthopedic Surgery

## 2023-01-04 ENCOUNTER — Telehealth: Payer: Self-pay | Admitting: Orthopedic Surgery

## 2023-01-04 ENCOUNTER — Encounter (HOSPITAL_COMMUNITY): Payer: Self-pay

## 2023-01-04 DIAGNOSIS — I10 Essential (primary) hypertension: Secondary | ICD-10-CM

## 2023-01-04 DIAGNOSIS — Z01818 Encounter for other preprocedural examination: Secondary | ICD-10-CM

## 2023-01-04 DIAGNOSIS — Z720 Tobacco use: Secondary | ICD-10-CM

## 2023-01-04 DIAGNOSIS — E1165 Type 2 diabetes mellitus with hyperglycemia: Secondary | ICD-10-CM

## 2023-01-04 NOTE — Telephone Encounter (Signed)
Dr. Ruthe Mannan patient - pt called, stated she is scheduled for surgery this Friday, 3/22, but she is really sick with fever, chills, sneezing, coughing, she stated the whole nine yards.  She stated she is so upset she can't have her surgery.  Would like a call back to reschedule.  Pt's # [phone]

## 2023-01-04 NOTE — Telephone Encounter (Signed)
Spoke with Hoyle Sauer who has already cancelled surgery, will wait for Amy to return so that she can discuss reschedule with Dr. Lemmie Evens.

## 2023-01-05 NOTE — Telephone Encounter (Signed)
RS to April 23rd

## 2023-01-05 NOTE — Telephone Encounter (Signed)
I need to review when  can RS if its 10 days or 14 days

## 2023-01-05 NOTE — Telephone Encounter (Signed)
2 weeks after resolution of symptoms or 4 weeks from illness

## 2023-01-25 ENCOUNTER — Encounter: Payer: Self-pay | Admitting: Obstetrics & Gynecology

## 2023-01-25 MED ORDER — SILVER SULFADIAZINE 1 % EX CREA: TOPICAL_CREAM | CUTANEOUS | 11 refills | Status: AC

## 2023-01-25 MED ORDER — SOLIFENACIN SUCCINATE 10 MG PO TABS: 10.0000 mg | ORAL_TABLET | Freq: Every day | ORAL | 3 refills | Status: DC

## 2023-01-31 ENCOUNTER — Other Ambulatory Visit (HOSPITAL_COMMUNITY): Payer: Self-pay

## 2023-01-31 ENCOUNTER — Telehealth: Payer: Self-pay

## 2023-01-31 NOTE — Telephone Encounter (Signed)
PA request received via CMM for Dexcom G7 Sensor  PA has been submitted to [employer] and is pending determination  Key: [id number]

## 2023-02-01 NOTE — Telephone Encounter (Signed)
PA has been APPROVED from 01/31/2023-08/02/2023

## 2023-02-02 NOTE — Patient Instructions (Addendum)
Your procedure is scheduled on: 02/07/2023  Report to [facility] Main Entrance at     6:00 AM.  Call this number if you have problems the morning of surgery: [phone]   Remember:  Hold Ozempic 7 days prior to procedure   Do not Eat or Drink after midnight         No Smoking the morning of surgery  :  Take these medicines the morning of surgery with A SIP OF WATER: Cymbalta         xanax, gabapentin, and oxycodone if needed  No Diabetic medications morning of procedure  Take only 1/2 dose of Lantus (40 units) the night before procedure   Do not wear jewelry, make-up or nail polish.  Do not wear lotions, powders, or perfumes. You may wear deodorant.  Do not shave 48 hours prior to surgery. Men may shave face and neck.  Do not bring valuables to the hospital.  Contacts, dentures or bridgework may not be worn into surgery.  Leave suitcase in the car. After surgery it may be brought to your room.  For patients admitted to the hospital, checkout time is 11:00 AM the day of discharge.   Patients discharged the day of surgery will not be allowed to drive home.    Special Instructions: Shower using CHG night before surgery and shower the day of surgery use CHG.  Use special wash - you have one bottle of CHG for all showers.  You should use approximately 1/2 of the bottle for each shower. How to Use Chlorhexidine Before Surgery Chlorhexidine gluconate (CHG) is a germ-killing (antiseptic) solution that is used to clean the skin. It can get rid of the bacteria that normally live on the skin and can keep them away for about 24 hours. To clean your skin with CHG, you may be given: A CHG solution to use in the shower or as part of a sponge bath. A prepackaged cloth that contains CHG. Cleaning your skin with CHG may help lower the risk for infection: While you are staying in the intensive care unit of the hospital. If you have a vascular access, such as a central line, to provide  short-term or long-term access to your veins. If you have a catheter to drain urine from your bladder. If you are on a ventilator. A ventilator is a machine that helps you breathe by moving air in and out of your lungs. After surgery. What are the risks? Risks of using CHG include: A skin reaction. Hearing loss, if CHG gets in your ears and you have a perforated eardrum. Eye injury, if CHG gets in your eyes and is not rinsed out. The CHG product catching fire. Make sure that you avoid smoking and flames after applying CHG to your skin. Do not use CHG: If you have a chlorhexidine allergy or have previously reacted to chlorhexidine. On babies younger than 29 months of age. How to use CHG solution Use CHG only as told by your health care provider, and follow the instructions on the label. Use the full amount of CHG as directed. Usually, this is one bottle. During a shower Follow these steps when using CHG solution during a shower (unless your health care provider gives you different instructions): Start the shower. Use your normal soap and shampoo to wash your face and hair. Turn off the shower or move out of the shower stream. Pour the CHG onto a clean washcloth. Do not use any type of brush  or rough-edged sponge. Starting at your neck, lather your body down to your toes. Make sure you follow these instructions: If you will be having surgery, pay special attention to the part of your body where you will be having surgery. Scrub this area for at least 1 minute. Do not use CHG on your head or face. If the solution gets into your ears or eyes, rinse them well with water. Avoid your genital area. Avoid any areas of skin that have broken skin, cuts, or scrapes. Scrub your back and under your arms. Make sure to wash skin folds. Let the lather sit on your skin for 1-2 minutes or as long as told by your health care provider. Thoroughly rinse your entire body in the shower. Make sure that all body  creases and crevices are rinsed well. Dry off with a clean towel. Do not put any substances on your body afterward--such as powder, lotion, or perfume--unless you are told to do so by your health care provider. Only use lotions that are recommended by the manufacturer. Put on clean clothes or pajamas. If it is the night before your surgery, sleep in clean sheets.  During a sponge bath Follow these steps when using CHG solution during a sponge bath (unless your health care provider gives you different instructions): Use your normal soap and shampoo to wash your face and hair. Pour the CHG onto a clean washcloth. Starting at your neck, lather your body down to your toes. Make sure you follow these instructions: If you will be having surgery, pay special attention to the part of your body where you will be having surgery. Scrub this area for at least 1 minute. Do not use CHG on your head or face. If the solution gets into your ears or eyes, rinse them well with water. Avoid your genital area. Avoid any areas of skin that have broken skin, cuts, or scrapes. Scrub your back and under your arms. Make sure to wash skin folds. Let the lather sit on your skin for 1-2 minutes or as long as told by your health care provider. Using a different clean, wet washcloth, thoroughly rinse your entire body. Make sure that all body creases and crevices are rinsed well. Dry off with a clean towel. Do not put any substances on your body afterward--such as powder, lotion, or perfume--unless you are told to do so by your health care provider. Only use lotions that are recommended by the manufacturer. Put on clean clothes or pajamas. If it is the night before your surgery, sleep in clean sheets. How to use CHG prepackaged cloths Only use CHG cloths as told by your health care provider, and follow the instructions on the label. Use the CHG cloth on clean, dry skin. Do not use the CHG cloth on your head or face unless  your health care provider tells you to. When washing with the CHG cloth: Avoid your genital area. Avoid any areas of skin that have broken skin, cuts, or scrapes. Before surgery Follow these steps when using a CHG cloth to clean before surgery (unless your health care provider gives you different instructions): Using the CHG cloth, vigorously scrub the part of your body where you will be having surgery. Scrub using a back-and-forth motion for 3 minutes. The area on your body should be completely wet with CHG when you are done scrubbing. Do not rinse. Discard the cloth and let the area air-dry. Do not put any substances on the area afterward, such  as powder, lotion, or perfume. Put on clean clothes or pajamas. If it is the night before your surgery, sleep in clean sheets.  For general bathing Follow these steps when using CHG cloths for general bathing (unless your health care provider gives you different instructions). Use a separate CHG cloth for each area of your body. Make sure you wash between any folds of skin and between your fingers and toes. Wash your body in the following order, switching to a new cloth after each step: The front of your neck, shoulders, and chest. Both of your arms, under your arms, and your hands. Your stomach and groin area, avoiding the genitals. Your right leg and foot. Your left leg and foot. The back of your neck, your back, and your buttocks. Do not rinse. Discard the cloth and let the area air-dry. Do not put any substances on your body afterward--such as powder, lotion, or perfume--unless you are told to do so by your health care provider. Only use lotions that are recommended by the manufacturer. Put on clean clothes or pajamas. Contact a health care provider if: Your skin gets irritated after scrubbing. You have questions about using your solution or cloth. You swallow any chlorhexidine. Call your local poison control center [phone] in the  [place] Get help right away if: Your eyes itch badly, or they become very red or swollen. Your skin itches badly and is red or swollen. Your hearing changes. You have trouble seeing. You have swelling or tingling in your mouth or throat. You have trouble breathing. These symptoms may represent a serious problem that is an emergency. Do not wait to see if the symptoms will go away. Get medical help right away. Call your local emergency services (911 in the [place] Do not drive yourself to the hospital. Summary Chlorhexidine gluconate (CHG) is a germ-killing (antiseptic) solution that is used to clean the skin. Cleaning your skin with CHG may help to lower your risk for infection. You may be given CHG to use for bathing. It may be in a bottle or in a prepackaged cloth to use on your skin. Carefully follow your health care provider's instructions and the instructions on the product label. Do not use CHG if you have a chlorhexidine allergy. Contact your health care provider if your skin gets irritated after scrubbing. This information is not intended to replace advice given to you by your health care provider. Make sure you discuss any questions you have with your health care provider. Document Revised: 01/31/2022 Document Reviewed: 12/14/2020 Elsevier Patient Education  2023 Elsevier Inc.  Cyst Removal, Care After This sheet gives you information about how to care for yourself after your procedure. Your health care provider may also give you more specific instructions. If you have problems or questions, contact your health care provider. What can I expect after the procedure? After the procedure, it is common to have: Soreness. Swelling. Stiffness. A splint or brace. Follow these instructions at home: Medicines Take over-the-counter and prescription medicines only as told by your health care provider. Ask your health care provider if the medicine prescribed to you requires you to avoid  driving or using machinery. Incision care  Follow instructions from your health care provider about how to take care of your incision or incisions. Make sure you: Wash your hands with soap and water for at least 20 seconds before and after you change your bandage (dressing). If soap and water are not available, use hand sanitizer. Change and remove your  dressing as told by your health care provider. Leave stitches (sutures), skin glue, or adhesive strips in place. These skin closures may need to stay in place for 2 weeks or longer. If adhesive strip edges start to loosen and curl up, you may trim the loose edges. Do not remove adhesive strips completely unless your health care provider tells you to do that. Check your incision area every day for signs of infection. Check for: Redness, swelling, or pain. Fluid or blood. Warmth. Pus or a bad smell. Do not take baths, swim, or use a hot tub until your health care provider approves. If you have a splint or brace: Wear it as told by your health care provider. Remove it only as told by your health care provider. You may need to wear the splint or brace for several days. Loosen the splint or brace if your fingers (or toes) tingle, become numb, or turn cold and blue. Keep the splint or brace clean. If the splint or brace is not waterproof: Do not let it get wet. Ask if you can remove it for bathing. If not, cover it with a watertight covering when you take a bath or shower. Managing pain, stiffness, and swelling  If directed, put ice on the incision area. To do this: If you have a removable splint or brace, remove it as told by your health care provider. Put ice in a plastic bag. Place a towel between your skin and the bag or between the splint or brace and the bag. Leave the ice on for 20 minutes, 2-3 times a day. Move your fingers (or toes) often to reduce stiffness and swelling. Raise (elevate) the affected area above the level of your heart  while you are sitting or lying down. Activity Return to your normal activities as told by your health care provider. Ask your health care provider what activities are safe for you. Avoid activities that cause pain. Ask your health care provider when it is safe to drive and when you should start doing movement exercises. General instructions Do not use any products that contain nicotine or tobacco, such as cigarettes, e-cigarettes, and chewing tobacco. These can delay healing. If you need help quitting, ask your health care provider. Keep all follow-up visits as told by your health care provider. This is important. Contact a health care provider if: Medicine is not relieving your pain. You have stiffness or swelling that gets worse. Your splint or brace is not fitting correctly, causing your fingers (or toes) to tingle, become numb, or turn cold and blue. You have any signs of infection at your incision site. You have a fever. Summary After the procedure, you can expect to have some soreness, stiffness, and swelling. You may need to wear a splint or brace for a few days. Follow instructions for changing your dressing and checking your incision area for signs of infection. Contact your health care provider if you have a fever, signs of infection, stiffness or swelling that gets worse, or continuing pain, tingling, or numbness. This information is not intended to replace advice given to you by your health care provider. Make sure you discuss any questions you have with your health care provider. Document Revised: 12/27/2019 Document Reviewed: 12/27/2019 Elsevier Patient Education  2023 Elsevier Inc. General Anesthesia, Adult, Care After The following information offers guidance on how to care for yourself after your procedure. Your health care provider may also give you more specific instructions. If you have problems or questions, contact  your health care provider. What can I expect after the  procedure? After the procedure, it is common for people to: Have pain or discomfort at the IV site. Have nausea or vomiting. Have a sore throat or hoarseness. Have trouble concentrating. Feel cold or chills. Feel weak, sleepy, or tired (fatigue). Have soreness and body aches. These can affect parts of the body that were not involved in surgery. Follow these instructions at home: For the time period you were told by your health care provider:  Rest. Do not participate in activities where you could fall or become injured. Do not drive or use machinery. Do not drink alcohol. Do not take sleeping pills or medicines that cause drowsiness. Do not make important decisions or sign legal documents. Do not take care of children on your own. General instructions Drink enough fluid to keep your urine pale yellow. If you have sleep apnea, surgery and certain medicines can increase your risk for breathing problems. Follow instructions from your health care provider about wearing your sleep device: Anytime you are sleeping, including during daytime naps. While taking prescription pain medicines, sleeping medicines, or medicines that make you drowsy. Return to your normal activities as told by your health care provider. Ask your health care provider what activities are safe for you. Take over-the-counter and prescription medicines only as told by your health care provider. Do not use any products that contain nicotine or tobacco. These products include cigarettes, chewing tobacco, and vaping devices, such as e-cigarettes. These can delay incision healing after surgery. If you need help quitting, ask your health care provider. Contact a health care provider if: You have nausea or vomiting that does not get better with medicine. You vomit every time you eat or drink. You have pain that does not get better with medicine. You cannot urinate or have bloody urine. You develop a skin rash. You have a  fever. Get help right away if: You have trouble breathing. You have chest pain. You vomit blood. These symptoms may be an emergency. Get help right away. Call 911. Do not wait to see if the symptoms will go away. Do not drive yourself to the hospital. Summary After the procedure, it is common to have a sore throat, hoarseness, nausea, vomiting, or to feel weak, sleepy, or fatigue. For the time period you were told by your health care provider, do not drive or use machinery. Get help right away if you have difficulty breathing, have chest pain, or vomit blood. These symptoms may be an emergency. This information is not intended to replace advice given to you by your health care provider. Make sure you discuss any questions you have with your health care provider. Document Revised: 12/31/2021 Document Reviewed: 12/31/2021 Elsevier Patient Education  2023 [employer].

## 2023-02-03 ENCOUNTER — Encounter (HOSPITAL_COMMUNITY)
Admission: RE | Admit: 2023-02-03 | Discharge: 2023-02-03 | Disposition: A | Discharge status: Home / Self Care | Payer: Medicaid Other | Source: Ambulatory Visit | Attending: Orthopedic Surgery | Admitting: Orthopedic Surgery

## 2023-02-03 ENCOUNTER — Other Ambulatory Visit: Payer: Self-pay

## 2023-02-03 ENCOUNTER — Encounter (HOSPITAL_COMMUNITY): Payer: Self-pay

## 2023-02-03 DIAGNOSIS — F17200 Nicotine dependence, unspecified, uncomplicated: Secondary | ICD-10-CM | POA: Diagnosis not present

## 2023-02-03 DIAGNOSIS — E1165 Type 2 diabetes mellitus with hyperglycemia: Secondary | ICD-10-CM | POA: Diagnosis not present

## 2023-02-03 DIAGNOSIS — Z720 Tobacco use: Secondary | ICD-10-CM

## 2023-02-03 DIAGNOSIS — Z01818 Encounter for other preprocedural examination: Secondary | ICD-10-CM | POA: Diagnosis present

## 2023-02-03 DIAGNOSIS — I10 Essential (primary) hypertension: Secondary | ICD-10-CM | POA: Insufficient documentation

## 2023-02-03 DIAGNOSIS — Z6841 Body Mass Index (BMI) 40.0 and over, adult: Secondary | ICD-10-CM | POA: Insufficient documentation

## 2023-02-03 DIAGNOSIS — M6740 Ganglion, unspecified site: Secondary | ICD-10-CM | POA: Diagnosis not present

## 2023-02-03 HISTORY — DX: Unspecified asthma, uncomplicated: J45.909

## 2023-02-03 HISTORY — DX: Gastro-esophageal reflux disease without esophagitis: K21.9

## 2023-02-03 LAB — CBC WITH DIFFERENTIAL/PLATELET
Abs Immature Granulocytes: 0.08 10*3/uL — ABNORMAL HIGH (ref 0.00–0.07)
Basophils Absolute: 0.1 10*3/uL (ref 0.0–0.1)
Basophils Relative: 1 %
Eosinophils Absolute: 0.3 10*3/uL (ref 0.0–0.5)
Eosinophils Relative: 3 %
HCT: 39.5 % (ref 36.0–46.0)
Hemoglobin: 13.2 g/dL (ref 12.0–15.0)
Immature Granulocytes: 1 %
Lymphocytes Relative: 29 %
Lymphs Abs: 3.8 10*3/uL (ref 0.7–4.0)
MCH: 28.3 pg (ref 26.0–34.0)
MCHC: 33.4 g/dL (ref 30.0–36.0)
MCV: 84.6 fL (ref 80.0–100.0)
Monocytes Absolute: 0.7 10*3/uL (ref 0.1–1.0)
Monocytes Relative: 5 %
Neutro Abs: 8.3 10*3/uL — ABNORMAL HIGH (ref 1.7–7.7)
Neutrophils Relative %: 61 %
Platelets: 265 10*3/uL (ref 150–400)
RBC: 4.67 MIL/uL (ref 3.87–5.11)
RDW: 12.3 % (ref 11.5–15.5)
WBC: 13.3 10*3/uL — ABNORMAL HIGH (ref 4.0–10.5)
nRBC: 0 % (ref 0.0–0.2)

## 2023-02-03 LAB — BASIC METABOLIC PANEL
Anion gap: 10 (ref 5–15)
BUN: 6 mg/dL (ref 6–20)
CO2: 23 mmol/L (ref 22–32)
Calcium: 8.9 mg/dL (ref 8.9–10.3)
Chloride: 99 mmol/L (ref 98–111)
Creatinine, Ser: 0.73 mg/dL (ref 0.44–1.00)
GFR, Estimated: >60 mL/min (ref >60)
Glucose, Bld: 228 mg/dL — ABNORMAL HIGH (ref 70–99)
Potassium: 3.5 mmol/L (ref 3.5–5.1)
Sodium: 132 mmol/L — ABNORMAL LOW (ref 135–145)

## 2023-02-03 LAB — POCT PREGNANCY, URINE: Preg Test, Ur: NEGATIVE

## 2023-02-06 NOTE — H&P (Addendum)
This is a 37 year old female with a history of diabetes who presents with a painful left wrist and a knot on the radial side of the wrist near the first extensor compartment.  She does not have any pain with wrist ulnar deviation.   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   Leukocytosis     Neck pain     PTSD (post-traumatic stress disorder)     Anxiety     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   Tobacco use 06/10/2016   Mastitis 05/24/2016   Left breast abscess 10/05/2014   Type 2 diabetes mellitus without complication (HCC) 10/05/2014   Hypokalemia 10/05/2014   Abscess of breast 10/05/2014   Depression 06/19/2014   Anemia 02/23/2014   Postpartum depression 12/23/2013   Preeclampsia 11/01/2013   Pelvic mass 04/15/2013   Morbid obesity (HCC) 04/15/2013   Morbid obesity with BMI of 50.0-59.9, adult (HCC) 04/15/2013     Family History  Problem Relation Age of Onset   Diabetes Father    Hypertension Father    Heart disease Daughter        heart murmur   Cancer Maternal Grandmother        ovarian cancer   Obesity Paternal Grandmother    Diabetes Paternal Grandfather     Social History   Tobacco Use   Smoking status: Every Day    Packs/day: 0.50    Years: 7.00    Additional pack years: 0.00    Total pack years: 3.50    Types: Cigarettes   Smokeless tobacco: Never  Vaping Use   Vaping Use: Never used  Substance Use Topics    Alcohol use: Yes    Comment: occasionally   Drug use: No    Past Medical History:  Diagnosis Date   Anemia    Anxiety    Asthma    CSF leak    Depression    GERD (gastroesophageal reflux disease)    Heartburn 09/01/2015   no current med.   History of anemia 01/2014   Non-insulin dependent type 2 diabetes mellitus    Obesity    PTSD (post-traumatic stress disorder)    Scar of breast 08/2015   left   Sinus headache     Past Surgical History:  Procedure Laterality Date   BREAST SURGERY Bilateral    bilateral breast surgery to remove cyst at [facility] 2017   CRANIOTOMY N/A 06/30/2017   Procedure: BIFRONTAL CRANIOTOMY;  Surgeon: Lisbeth Renshaw, MD;  Location: [facility] OR;  Service: Neurosurgery;  Laterality: N/A;  BIFRONTAL CRANIOTOMY Repair of Encephalocele   INCISION AND DRAINAGE ABSCESS Left 10/07/2014   Procedure: INCISION AND DRAINAGE LEFT BREAST ABSCESS;  Surgeon: Dalia Heading, MD;  Location: [facility] ORS;  Service: General;  Laterality: Left;   LAPAROSCOPIC OVARIAN CYSTECTOMY Right 02/05/2014   Procedure: LAPAROSCOPIC right retroperitoneal (NOT OVARIAN) CYSTECTOMY;  Surgeon: Lazaro Arms, MD;  Location: [facility] ORS;  Service: Gynecology;  Laterality: Right;   MASS EXCISION Left 09/07/2015   Procedure: Minor SCAR EXCISION LEFT BREAST, PLASTIC CLOSURE;  Surgeon: Louisa Second, MD;  Location: [facility];  Service: Plastics;  Laterality: Left;   TYMPANOSTOMY TUBE PLACEMENT Bilateral 10/17/2014      Current Outpatient Medications  Medication Instructions   albuterol (PROVENTIL HFA;VENTOLIN HFA) 108 (90 Base) MCG/ACT inhaler 2 puffs, Inhalation, Every 6 hours PRN   ALPRAZolam (XANAX) 1 MG tablet TAKE 1 TABLET BY MOUTH THREE TIMES DAILY   amphetamine-dextroamphetamine (ADDERALL) 30 MG tablet 15 mg, Oral, Daily PRN   Bioflavonoid Products (VITAMIN C) CHEW 2 tablets, Oral, Daily   Continuous Blood Gluc Receiver (DEXCOM G7 RECEIVER) DEVI Use to monitor BG continuously    Continuous Blood Gluc Sensor (DEXCOM G7 SENSOR) MISC Change sensor every 10 days   DULoxetine (CYMBALTA) 60 mg, Oral, 2 times daily   ELDERBERRY PO 2 tablets, Oral, Daily   Emollient (GOLD BOND DIABETICS DRY SKIN) CREA 1 Application, Apply externally, Daily PRN, Uses with aspercreme    gabapentin (NEURONTIN) 800 mg, Oral, 2 times daily   glipiZIDE (GLUCOTROL XL) 10 mg, Oral, Daily with breakfast   hydrOXYzine (ATARAX) 100 mg, Oral, Daily at bedtime   insulin glargine (LANTUS) 80 Units, Subcutaneous, Daily at bedtime   naproxen sodium (ALEVE) 440 mg, Oral, 2 times daily PRN   NOVOLOG FLEXPEN 100 UNIT/ML FlexPen INJECT 24 TO 30 UNITS INTO THE SKIN THREE TIMES DAILY BEFORE MEALS   OVER THE COUNTER MEDICATION 1 Dose, Oral, Daily, [place]   oxyCODONE-acetaminophen (PERCOCET) 10-325 MG tablet 1 tablet, Oral, Every 4 hours PRN   Ozempic (1 MG/DOSE) 1 mg, Subcutaneous, Weekly   Phenylephrine-Acetaminophen (TYLENOL SINUS+HEADACHE PO) 2 tablets, Oral, 2 times daily PRN   rosuvastatin (CRESTOR) 10 mg, Oral, Daily   silver sulfADIAZINE (SILVADENE) 1 % cream Use to area 2-3 times daily   solifenacin (VESICARE) 10 mg, Oral, Daily   trolamine salicylate (ASPERCREME) 10 % cream 1 Application, Topical, As needed      BP (!) 169/111   Pulse (!) 109   Ht 5' 7.5" (1.715 m)   Wt (!) 325 lb (147.4 kg)   BMI 50.15 kg/m    She is awake and alert and oriented x 3   Mood and affect are normal   General appearance is normal except for the BMI of 50   Pulse perfusion normal in the left upper extremity and right upper extremity   Evaluation of the left upper extremity and the cystic area not area appears to be either a ganglion cyst of the abductor to the thumb or wrist joint ganglion cyst   She has normal sensation in the thumb index and long finger 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  Diagnosis ganglion cyst left wrist   There are certainly risks with removing this which were explained to her 1 radial dorsal sensory nerve is at risk.  Thumb extensor tendons and compartment #1 are also at risk.  Recurrence is also a risk.  Stiffness is a risk.   She would like  to proceed with surgical excision of the cyst left wrist

## 2023-02-07 ENCOUNTER — Encounter (HOSPITAL_COMMUNITY): Payer: Self-pay | Admitting: Orthopedic Surgery

## 2023-02-07 ENCOUNTER — Ambulatory Visit (HOSPITAL_COMMUNITY): Payer: Medicaid Other | Admitting: Certified Registered"

## 2023-02-07 ENCOUNTER — Ambulatory Visit (HOSPITAL_BASED_OUTPATIENT_CLINIC_OR_DEPARTMENT_OTHER): Payer: Medicaid Other | Admitting: Certified Registered"

## 2023-02-07 ENCOUNTER — Encounter (HOSPITAL_COMMUNITY)
Admission: RE | Disposition: A | Discharge status: Home / Self Care | Payer: Self-pay | Source: Home / Self Care | Attending: Orthopedic Surgery

## 2023-02-07 ENCOUNTER — Other Ambulatory Visit: Payer: Self-pay

## 2023-02-07 ENCOUNTER — Telehealth: Payer: Self-pay | Admitting: Nurse Practitioner

## 2023-02-07 ENCOUNTER — Ambulatory Visit (HOSPITAL_COMMUNITY)
Admission: RE | Admit: 2023-02-07 | Discharge: 2023-02-07 | Disposition: A | Discharge status: Home / Self Care | Payer: Medicaid Other | Attending: Orthopedic Surgery | Admitting: Orthopedic Surgery

## 2023-02-07 DIAGNOSIS — F909 Attention-deficit hyperactivity disorder, unspecified type: Secondary | ICD-10-CM | POA: Insufficient documentation

## 2023-02-07 DIAGNOSIS — M67432 Ganglion, left wrist: Secondary | ICD-10-CM | POA: Diagnosis present

## 2023-02-07 DIAGNOSIS — F418 Other specified anxiety disorders: Secondary | ICD-10-CM | POA: Diagnosis not present

## 2023-02-07 DIAGNOSIS — Z79899 Other long term (current) drug therapy: Secondary | ICD-10-CM | POA: Diagnosis not present

## 2023-02-07 DIAGNOSIS — J45909 Unspecified asthma, uncomplicated: Secondary | ICD-10-CM | POA: Diagnosis not present

## 2023-02-07 DIAGNOSIS — Z794 Long term (current) use of insulin: Secondary | ICD-10-CM | POA: Diagnosis not present

## 2023-02-07 DIAGNOSIS — E119 Type 2 diabetes mellitus without complications: Secondary | ICD-10-CM | POA: Insufficient documentation

## 2023-02-07 DIAGNOSIS — K219 Gastro-esophageal reflux disease without esophagitis: Secondary | ICD-10-CM | POA: Diagnosis not present

## 2023-02-07 DIAGNOSIS — I10 Essential (primary) hypertension: Secondary | ICD-10-CM | POA: Insufficient documentation

## 2023-02-07 DIAGNOSIS — F17210 Nicotine dependence, cigarettes, uncomplicated: Secondary | ICD-10-CM

## 2023-02-07 DIAGNOSIS — E1165 Type 2 diabetes mellitus with hyperglycemia: Secondary | ICD-10-CM

## 2023-02-07 DIAGNOSIS — Z6841 Body Mass Index (BMI) 40.0 and over, adult: Secondary | ICD-10-CM | POA: Insufficient documentation

## 2023-02-07 DIAGNOSIS — F319 Bipolar disorder, unspecified: Secondary | ICD-10-CM | POA: Insufficient documentation

## 2023-02-07 HISTORY — PX: GANGLION CYST EXCISION: SHX1691

## 2023-02-07 LAB — GLUCOSE, CAPILLARY
Glucose-Capillary: 175 mg/dL — ABNORMAL HIGH (ref 70–99)
Glucose-Capillary: 182 mg/dL — ABNORMAL HIGH (ref 70–99)

## 2023-02-07 SURGERY — EXCISION, GANGLION CYST, WRIST
Anesthesia: General | Site: Wrist | Laterality: Left

## 2023-02-07 MED ORDER — SODIUM CHLORIDE 0.9 % IR SOLN
Status: DC | PRN
  Administered 2023-02-07: 1000 mL

## 2023-02-07 MED ORDER — PROPOFOL 10 MG/ML IV BOLUS
INTRAVENOUS | Status: AC
  Filled 2023-02-07: qty 20

## 2023-02-07 MED ORDER — KETOROLAC TROMETHAMINE 30 MG/ML IJ SOLN
INTRAMUSCULAR | Status: DC | PRN
  Administered 2023-02-07: 30 mg via INTRAVENOUS

## 2023-02-07 MED ORDER — SUCCINYLCHOLINE CHLORIDE 200 MG/10ML IV SOSY
PREFILLED_SYRINGE | INTRAVENOUS | Status: DC | PRN
  Administered 2023-02-07: 120 mg via INTRAVENOUS

## 2023-02-07 MED ORDER — MIDAZOLAM HCL 2 MG/2ML IJ SOLN: 2.0000 mg | INTRAMUSCULAR | Status: AC

## 2023-02-07 MED ORDER — MIDAZOLAM HCL 2 MG/2ML IJ SOLN
INTRAMUSCULAR | Status: AC
  Administered 2023-02-07: 2 mg via INTRAVENOUS
  Filled 2023-02-07: qty 2

## 2023-02-07 MED ORDER — PROPOFOL 10 MG/ML IV BOLUS
INTRAVENOUS | Status: DC | PRN
  Administered 2023-02-07: 200 mg via INTRAVENOUS

## 2023-02-07 MED ORDER — SEVOFLURANE IN SOLN
RESPIRATORY_TRACT | Status: AC
  Filled 2023-02-07: qty 250

## 2023-02-07 MED ORDER — CHLORHEXIDINE GLUCONATE 0.12 % MT SOLN
15.0000 mL | Freq: Once | OROMUCOSAL | Status: AC
  Administered 2023-02-07: 15 mL via OROMUCOSAL

## 2023-02-07 MED ORDER — MIDAZOLAM HCL 2 MG/2ML IJ SOLN
INTRAMUSCULAR | Status: DC | PRN
  Administered 2023-02-07: 2 mg via INTRAVENOUS

## 2023-02-07 MED ORDER — DEXAMETHASONE SODIUM PHOSPHATE 4 MG/ML IJ SOLN
INTRAMUSCULAR | Status: DC | PRN
  Administered 2023-02-07: 5 mg via INTRAVENOUS

## 2023-02-07 MED ORDER — KETOROLAC TROMETHAMINE 30 MG/ML IJ SOLN
INTRAMUSCULAR | Status: AC
  Filled 2023-02-07: qty 1

## 2023-02-07 MED ORDER — ONDANSETRON HCL 4 MG/2ML IJ SOLN: 4.0000 mg | Freq: Once | INTRAMUSCULAR | Status: DC | PRN

## 2023-02-07 MED ORDER — PHENYLEPHRINE 80 MCG/ML (10ML) SYRINGE FOR IV PUSH (FOR BLOOD PRESSURE SUPPORT)
PREFILLED_SYRINGE | INTRAVENOUS | Status: DC | PRN
  Administered 2023-02-07 (×5): 160 ug via INTRAVENOUS

## 2023-02-07 MED ORDER — LIDOCAINE HCL (PF) 2 % IJ SOLN
INTRAMUSCULAR | Status: AC
  Filled 2023-02-07: qty 5

## 2023-02-07 MED ORDER — ROCURONIUM BROMIDE 10 MG/ML (PF) SYRINGE
PREFILLED_SYRINGE | INTRAVENOUS | Status: AC
  Filled 2023-02-07: qty 10

## 2023-02-07 MED ORDER — GLYCOPYRROLATE PF 0.2 MG/ML IJ SOSY
PREFILLED_SYRINGE | INTRAMUSCULAR | Status: AC
  Filled 2023-02-07: qty 1

## 2023-02-07 MED ORDER — FENTANYL CITRATE (PF) 250 MCG/5ML IJ SOLN
INTRAMUSCULAR | Status: DC | PRN
  Administered 2023-02-07: 25 ug via INTRAVENOUS
  Administered 2023-02-07: 75 ug via INTRAVENOUS

## 2023-02-07 MED ORDER — ONDANSETRON HCL 4 MG/2ML IJ SOLN
INTRAMUSCULAR | Status: AC
  Filled 2023-02-07: qty 2

## 2023-02-07 MED ORDER — BUPIVACAINE HCL (PF) 0.5 % IJ SOLN
INTRAMUSCULAR | Status: AC
  Filled 2023-02-07: qty 30

## 2023-02-07 MED ORDER — SUCCINYLCHOLINE CHLORIDE 200 MG/10ML IV SOSY
PREFILLED_SYRINGE | INTRAVENOUS | Status: AC
  Filled 2023-02-07: qty 10

## 2023-02-07 MED ORDER — ACETAMINOPHEN 500 MG PO TABS: 500.0000 mg | ORAL_TABLET | Freq: Four times a day (QID) | ORAL | 2 refills | Status: AC | PRN

## 2023-02-07 MED ORDER — DEXCOM G7 SENSOR MISC
2 refills | Status: DC
Start: 2023-02-07 — End: 2023-03-16

## 2023-02-07 MED ORDER — PHENYLEPHRINE 80 MCG/ML (10ML) SYRINGE FOR IV PUSH (FOR BLOOD PRESSURE SUPPORT)
PREFILLED_SYRINGE | INTRAVENOUS | Status: AC
  Filled 2023-02-07: qty 10

## 2023-02-07 MED ORDER — ORAL CARE MOUTH RINSE: 15.0000 mL | Freq: Once | OROMUCOSAL | Status: AC

## 2023-02-07 MED ORDER — EPHEDRINE SULFATE-NACL 50-0.9 MG/10ML-% IV SOSY
PREFILLED_SYRINGE | INTRAVENOUS | Status: DC | PRN
  Administered 2023-02-07: 10 mg via INTRAVENOUS
  Administered 2023-02-07: 5 mg via INTRAVENOUS
  Administered 2023-02-07: 10 mg via INTRAVENOUS

## 2023-02-07 MED ORDER — LIDOCAINE 2% (20 MG/ML) 5 ML SYRINGE
INTRAMUSCULAR | Status: DC | PRN
  Administered 2023-02-07: 100 mg via INTRAVENOUS

## 2023-02-07 MED ORDER — BUPIVACAINE HCL (PF) 0.5 % IJ SOLN
INTRAMUSCULAR | Status: DC | PRN
  Administered 2023-02-07: 10 mL

## 2023-02-07 MED ORDER — EPHEDRINE 5 MG/ML INJ
INTRAVENOUS | Status: AC
  Filled 2023-02-07: qty 5

## 2023-02-07 MED ORDER — VANCOMYCIN HCL 1500 MG/300ML IV SOLN
1500.0000 mg | INTRAVENOUS | Status: AC
  Administered 2023-02-07: 1500 mg via INTRAVENOUS
  Filled 2023-02-07 (×2): qty 300

## 2023-02-07 MED ORDER — DEXMEDETOMIDINE HCL IN NACL 80 MCG/20ML IV SOLN
INTRAVENOUS | Status: DC | PRN
  Administered 2023-02-07 (×2): 8 ug via BUCCAL

## 2023-02-07 MED ORDER — IBUPROFEN 800 MG PO TABS: 800.0000 mg | ORAL_TABLET | Freq: Three times a day (TID) | ORAL | 1 refills | Status: DC | PRN

## 2023-02-07 MED ORDER — ONDANSETRON HCL 4 MG/2ML IJ SOLN
INTRAMUSCULAR | Status: DC | PRN
  Administered 2023-02-07: 4 mg via INTRAVENOUS

## 2023-02-07 MED ORDER — GLYCOPYRROLATE PF 0.2 MG/ML IJ SOSY
PREFILLED_SYRINGE | INTRAMUSCULAR | Status: DC | PRN
  Administered 2023-02-07: .2 mg via INTRAVENOUS

## 2023-02-07 MED ORDER — LACTATED RINGERS IV SOLN: INTRAVENOUS | Status: DC

## 2023-02-07 MED ORDER — MIDAZOLAM HCL 2 MG/2ML IJ SOLN
INTRAMUSCULAR | Status: AC
  Filled 2023-02-07: qty 2

## 2023-02-07 MED ORDER — FENTANYL CITRATE (PF) 100 MCG/2ML IJ SOLN
INTRAMUSCULAR | Status: AC
  Filled 2023-02-07: qty 2

## 2023-02-07 MED ORDER — DEXMEDETOMIDINE HCL IN NACL 80 MCG/20ML IV SOLN
INTRAVENOUS | Status: AC
  Filled 2023-02-07: qty 20

## 2023-02-07 MED ORDER — METOCLOPRAMIDE HCL 5 MG/ML IJ SOLN
10.0000 mg | Freq: Once | INTRAMUSCULAR | Status: AC
  Administered 2023-02-07: 10 mg via INTRAVENOUS
  Filled 2023-02-07: qty 2

## 2023-02-07 MED ORDER — DEXAMETHASONE SODIUM PHOSPHATE 10 MG/ML IJ SOLN
INTRAMUSCULAR | Status: AC
  Filled 2023-02-07: qty 1

## 2023-02-07 MED ORDER — DEXCOM G7 RECEIVER DEVI
0 refills | Status: AC
Start: 2023-02-07 — End: ?

## 2023-02-07 MED ORDER — FENTANYL CITRATE PF 50 MCG/ML IJ SOSY: 25.0000 ug | PREFILLED_SYRINGE | INTRAMUSCULAR | Status: DC | PRN

## 2023-02-07 SURGICAL SUPPLY — 41 items
APL PRP STRL LF DISP 70% ISPRP (MISCELLANEOUS) ×1
BANDAGE ESMARK 4X12 BL STRL LF (DISPOSABLE) IMPLANT
BLADE SURG 15 STRL LF DISP TIS (BLADE) ×1 IMPLANT
BLADE SURG 15 STRL SS (BLADE) ×1
BNDG CMPR 12X4 ELC STRL LF (DISPOSABLE) ×1
BNDG CMPR STD VLCR NS LF 5.8X3 (GAUZE/BANDAGES/DRESSINGS) ×1
BNDG COHESIVE 4X5 TAN STRL (GAUZE/BANDAGES/DRESSINGS) ×1 IMPLANT
BNDG ELASTIC 3X5.8 VLCR NS LF (GAUZE/BANDAGES/DRESSINGS) ×1 IMPLANT
BNDG ESMARK 4X12 BLUE STRL LF (DISPOSABLE) ×1
BNDG GAUZE DERMACEA FLUFF 4 (GAUZE/BANDAGES/DRESSINGS) IMPLANT
BNDG GZE DERMACEA 4 6PLY (GAUZE/BANDAGES/DRESSINGS) ×1
CHLORAPREP W/TINT 26 (MISCELLANEOUS) ×1 IMPLANT
CLOTH BEACON ORANGE TIMEOUT ST (SAFETY) ×1 IMPLANT
COVER LIGHT HANDLE STERIS (MISCELLANEOUS) ×2 IMPLANT
CUFF TOURN SGL QUICK 34 (TOURNIQUET CUFF) ×1
CUFF TRNQT CYL 34X4.125X (TOURNIQUET CUFF) IMPLANT
DRAPE HALF SHEET 40X57 (DRAPES) IMPLANT
ELECT NDL TIP 2.8 STRL (NEEDLE) ×1 IMPLANT
ELECT NEEDLE TIP 2.8 STRL (NEEDLE) ×1 IMPLANT
ELECT REM PT RETURN 9FT ADLT (ELECTROSURGICAL) ×1
ELECTRODE REM PT RTRN 9FT ADLT (ELECTROSURGICAL) ×1 IMPLANT
GAUZE SPONGE 4X4 12PLY STRL (GAUZE/BANDAGES/DRESSINGS) ×1 IMPLANT
GLOVE BIOGEL PI IND STRL 6.5 (GLOVE) IMPLANT
GLOVE BIOGEL PI IND STRL 7.0 (GLOVE) ×2 IMPLANT
GLOVE SS BIOGEL STRL SZ 6.5 (GLOVE) IMPLANT
GLOVE SS N UNI LF 8.5 STRL (GLOVE) ×1 IMPLANT
GLOVE SURG POLYISO LF SZ8 (GLOVE) ×1 IMPLANT
GOWN STRL REUS W/TWL LRG LVL3 (GOWN DISPOSABLE) ×1 IMPLANT
GOWN STRL REUS W/TWL XL LVL3 (GOWN DISPOSABLE) ×1 IMPLANT
KIT TURNOVER KIT A (KITS) ×1 IMPLANT
MANIFOLD NEPTUNE II (INSTRUMENTS) ×1 IMPLANT
NDL HYPO 21X1.5 SAFETY (NEEDLE) ×1 IMPLANT
NEEDLE HYPO 21X1.5 SAFETY (NEEDLE) ×1 IMPLANT
NS IRRIG 1000ML POUR BTL (IV SOLUTION) ×1 IMPLANT
PACK BASIC LIMB (CUSTOM PROCEDURE TRAY) ×1 IMPLANT
PAD ARMBOARD 7.5X6 YLW CONV (MISCELLANEOUS) ×1 IMPLANT
SET BASIN LINEN APH (SET/KITS/TRAYS/PACK) ×1 IMPLANT
STRIP CLOSURE SKIN 1/2X4 (GAUZE/BANDAGES/DRESSINGS) ×1 IMPLANT
SUT MON AB 2-0 SH 27 (SUTURE) ×1
SUT MON AB 2-0 SH27 (SUTURE) ×1 IMPLANT
SYR CONTROL 10ML LL (SYRINGE) ×1 IMPLANT

## 2023-02-07 NOTE — Telephone Encounter (Signed)
Rx's for [facility] G7 receiver and sensors sent to [facility] on [address].

## 2023-02-07 NOTE — Telephone Encounter (Signed)
Pt said she got her approval letter for her Dexcom G7. She needs a new RX sent for the receiver/ sensor sent to [facility] on [address]

## 2023-02-07 NOTE — Anesthesia Postprocedure Evaluation (Signed)
Anesthesia Post Note  Patient: Judy Nelson  Procedure(s) Performed: REMOVAL GANGLION OF WRIST (Left: Wrist)  Patient location during evaluation: Phase II Anesthesia Type: General Level of consciousness: awake and alert and oriented Pain management: pain level controlled Vital Signs Assessment: post-procedure vital signs reviewed and stable Respiratory status: spontaneous breathing, nonlabored ventilation and respiratory function stable Cardiovascular status: blood pressure returned to baseline and stable Postop Assessment: no apparent nausea or vomiting Anesthetic complications: no  No notable events documented.   Last Vitals:  Vitals:   02/07/23 0900 02/07/23 0912  BP: 103/64   Pulse: (!) 106 (!) 102  Resp: 20 16  Temp:    SpO2:  94%    Last Pain:  Vitals:   02/07/23 0916  PainSc: 0-No pain                 Dorma Altman C Kaevon Cotta

## 2023-02-07 NOTE — Interval H&P Note (Signed)
History and Physical Interval Note:  02/07/2023 7:24 AM  Judy Nelson  has presented today for surgery, with the diagnosis of ganglion cyst left wrist.  The various methods of treatment have been discussed with the patient and family. After consideration of risks, benefits and other options for treatment, the patient has consented to  Procedure(s) with comments: REMOVAL GANGLION OF WRIST (Left) - LMA as a surgical intervention.  The patient's history has been reviewed, patient examined, no change in status, stable for surgery.  I have reviewed the patient's chart and labs.  Questions were answered to the patient's satisfaction.     Fuller Canada

## 2023-02-07 NOTE — Op Note (Signed)
02/07/2023  8:26 AM  PATIENT:  Judy Nelson  37 y.o. female  PRE-OPERATIVE DIAGNOSIS:  ganglion cyst left wrist  POST-OPERATIVE DIAGNOSIS:  ganglion cyst left wrist  PROCEDURE:  Procedure(s) with comments: REMOVAL GANGLION OF WRIST (Left) - LMA  SURGEON:  Surgeon(s) and Role:    Vickki Hearing, MD - Primary  Procedure [name] is 37 years old she had a mass on her left wrist it was bothering her and she wanted it removed  She was brought to the operating room after reevaluation in the preop area.  There she was cleared for surgery.  The chart was reviewed.  Surgical site was confirmed and marked his left wrist.  Once in the operating room she was intubated with an LMA  The left wrist was prepped and draped sterilely and timeout was completed  The limb was exsanguinated with the 4 inch Esmarch and the tourniquet was elevated to 250 mmHg.  A transverse incision was made in Langer's lines over the mass.  The subcutaneous tissue was divided the mass was encountered as a mass of the first extensor compartment tendon sheath.  This was removed.  The tendons were inspected and found to be freely mobile without stenosis.  There is no inflammation.  The cyst was sent for pathologic review  The wound was irrigated and closed with 2-0 Monocryl suture and the wound was secured with Steri-Strips.  We injected Marcaine as a field block proximal to the incision  Sterile dressings were applied after tourniquet release  The patient was extubated and taken to recovery room in stable condition  Postoperative plan  Dressing for 48 hours then it can be removed in the office.  PHYSICIAN ASSISTANT:   ASSISTANTS: none   ANESTHESIA:   general  EBL:  none   BLOOD ADMINISTERED:none  DRAINS: none   LOCAL MEDICATIONS USED:  MARCAINE     SPECIMEN:  No Specimen  DISPOSITION OF SPECIMEN:  N/A  COUNTS:  YES  TOURNIQUET:   Total Tourniquet Time Documented: Upper Arm (Left) - 19  minutes Total: Upper Arm (Left) - 19 minutes   DICTATION: .Reubin Milan Dictation  PLAN OF CARE: Discharge to home after PACU  PATIENT DISPOSITION:  PACU - hemodynamically stable.   Delay start of Pharmacological VTE agent (>24hrs) due to surgical blood loss or risk of bleeding: not applicable

## 2023-02-07 NOTE — Brief Op Note (Signed)
02/07/2023  8:26 AM  PATIENT:  Judy Nelson  37 y.o. female  PRE-OPERATIVE DIAGNOSIS:  ganglion cyst left wrist  POST-OPERATIVE DIAGNOSIS:  ganglion cyst left wrist  PROCEDURE:  Procedure(s) with comments: REMOVAL GANGLION OF WRIST (Left) - LMA  SURGEON:  Surgeon(s) and Role:    Vickki Hearing, MD - Primary  PHYSICIAN ASSISTANT:   ASSISTANTS: none   ANESTHESIA:   general  EBL:  none   BLOOD ADMINISTERED:none  DRAINS: none   LOCAL MEDICATIONS USED:  MARCAINE     SPECIMEN:  No Specimen  DISPOSITION OF SPECIMEN:  N/A  COUNTS:  YES  TOURNIQUET:   Total Tourniquet Time Documented: Upper Arm (Left) - 19 minutes Total: Upper Arm (Left) - 19 minutes   DICTATION: .Reubin Milan Dictation  PLAN OF CARE: Discharge to home after PACU  PATIENT DISPOSITION:  PACU - hemodynamically stable.   Delay start of Pharmacological VTE agent (>24hrs) due to surgical blood loss or risk of bleeding: not applicable

## 2023-02-07 NOTE — Anesthesia Procedure Notes (Signed)
Procedure Name: Intubation Date/Time: 02/07/2023 7:42 AM  Performed by: Julian Reil, CRNAPre-anesthesia Checklist: Patient identified, Emergency Drugs available, Suction available and Patient being monitored Patient Re-evaluated:Patient Re-evaluated prior to induction Oxygen Delivery Method: Circle system utilized Preoxygenation: Pre-oxygenation with 100% oxygen Induction Type: IV induction, Rapid sequence and Cricoid Pressure applied Laryngoscope Size: Glidescope and 3 Grade View: Grade I Tube type: Oral Tube size: 7.0 mm Number of attempts: 1 Airway Equipment and Method: Stylet and Video-laryngoscopy Placement Confirmation: ETT inserted through vocal cords under direct vision, positive ETCO2 and breath sounds checked- equal and bilateral Secured at: 24 cm Tube secured with: Tape Dental Injury: Injury to tongue and Teeth and Oropharynx as per pre-operative assessment  Comments: Glidescope electively used due to H/O GERD, large tongue.  Very large edematous tonsils noted on DL.  Scant amount bleeding noted left side anterior tongue after DL.

## 2023-02-07 NOTE — Anesthesia Preprocedure Evaluation (Signed)
Anesthesia Evaluation  Patient identified by MRN, date of birth, ID band Patient awake    Reviewed: Allergy & Precautions, H&P , NPO status , Patient's Chart, lab work & pertinent test results  History of Anesthesia Complications Negative for: history of anesthetic complications  Airway Mallampati: II  TM Distance: >3 FB Neck ROM: Full    Dental  (+) Dental Advisory Given, Missing   Pulmonary asthma , Current Smoker and Patient abstained from smoking.   Pulmonary exam normal breath sounds clear to auscultation       Cardiovascular hypertension, Pt. on medications Normal cardiovascular exam Rhythm:Regular Rate:Normal     Neuro/Psych  Headaches PSYCHIATRIC DISORDERS Anxiety Depression Bipolar Disorder      GI/Hepatic Neg liver ROS,GERD (takes meds whenver she has symptoms of GERD)  Controlled,,  Endo/Other  diabetes, Well Controlled, Type 2, Insulin Dependent  Morbid obesity  Renal/GU negative Renal ROS  negative genitourinary   Musculoskeletal negative musculoskeletal ROS (+)    Abdominal   Peds  (+) ADHD Hematology  (+) Blood dyscrasia, anemia   Anesthesia Other Findings   Reproductive/Obstetrics negative OB ROS                             Anesthesia Physical Anesthesia Plan  ASA: 3  Anesthesia Plan: General   Post-op Pain Management: Dilaudid IV   Induction: Intravenous and Rapid sequence  PONV Risk Score and Plan: 3 and Ondansetron, Dexamethasone and Metaclopromide  Airway Management Planned: Oral ETT  Additional Equipment:   Intra-op Plan:   Post-operative Plan: Extubation in OR  Informed Consent: I have reviewed the patients History and Physical, chart, labs and discussed the procedure including the risks, benefits and alternatives for the proposed anesthesia with the patient or authorized representative who has indicated his/her understanding and acceptance.      Dental advisory given  Plan Discussed with: CRNA and Surgeon  Anesthesia Plan Comments:         Anesthesia Quick Evaluation

## 2023-02-07 NOTE — Transfer of Care (Signed)
Immediate Anesthesia Transfer of Care Note  Patient: Amaiyah Nordhoff  Procedure(s) Performed: REMOVAL GANGLION OF WRIST (Left: Wrist)  Patient Location: PACU  Anesthesia Type:General  Level of Consciousness: drowsy  Airway & Oxygen Therapy: Patient Spontanous Breathing and Patient connected to face mask oxygen  Post-op Assessment: Report given to RN and Post -op Vital signs reviewed and stable  Post vital signs: Reviewed and stable  Last Vitals:  Vitals Value Taken Time  BP    Temp    Pulse 94 02/07/23 0839  Resp 22 02/07/23 0839  SpO2 93 % 02/07/23 0839  Vitals shown include unvalidated device data.  Last Pain:  Vitals:   02/07/23 1610  PainSc: 0-No pain         Complications: No notable events documented.

## 2023-02-08 LAB — SURGICAL PATHOLOGY

## 2023-02-10 ENCOUNTER — Encounter: Payer: Self-pay | Admitting: Orthopedic Surgery

## 2023-02-10 ENCOUNTER — Ambulatory Visit (INDEPENDENT_AMBULATORY_CARE_PROVIDER_SITE_OTHER): Payer: Medicaid Other | Admitting: Orthopedic Surgery

## 2023-02-10 DIAGNOSIS — M67432 Ganglion, left wrist: Secondary | ICD-10-CM

## 2023-02-10 NOTE — Progress Notes (Signed)
   This is a postop visit  Encounter Diagnosis  Name Primary?   Ganglion cyst of wrist, left s/p excision on 02/07/23 Yes    Chief Complaint  Patient presents with   Post-op Follow-up    Ganglion cyst excision 02/07/23     The patient is status post excision of cyst tendon sheath the first extensor compartment  This is postop day number 3  Postop pain control nothing needed at this time  Subjective complaints none  Physical exam findings dressing change and wound check nice and clean no drainage  Assessment and plan  Postop excision of cyst first extensor compartment tendon sheath/retinaculum  Pathology report no evidence of cancer  Follow-up May 6 to clip the suture ends

## 2023-02-15 ENCOUNTER — Other Ambulatory Visit: Payer: Self-pay | Admitting: "Endocrinology

## 2023-02-20 ENCOUNTER — Ambulatory Visit (INDEPENDENT_AMBULATORY_CARE_PROVIDER_SITE_OTHER): Payer: Medicaid Other | Admitting: Orthopedic Surgery

## 2023-02-20 ENCOUNTER — Encounter: Payer: Self-pay | Admitting: Orthopedic Surgery

## 2023-02-20 DIAGNOSIS — M67432 Ganglion, left wrist: Secondary | ICD-10-CM

## 2023-02-20 NOTE — Progress Notes (Signed)
Chief Complaint  Patient presents with   Post-op Follow-up    Workin post op Left ganglion excision 02/07/23 patient state she is doing well from surgery / suture trimmed    Judy Nelson is postop she is doing well no issues  We trim the suture edges from the absorbable stitch  No issues at this time released

## 2023-03-15 LAB — LIPID PANEL
Chol/HDL Ratio: 6.6 ratio — ABNORMAL HIGH (ref 0.0–4.4)
Cholesterol, Total: 252 mg/dL — ABNORMAL HIGH (ref 100–199)
HDL: 38 mg/dL — ABNORMAL LOW (ref >39)
LDL Chol Calc (NIH): 154 mg/dL — ABNORMAL HIGH (ref 0–99)
Triglycerides: 319 mg/dL — ABNORMAL HIGH (ref 0–149)
VLDL Cholesterol Cal: 60 mg/dL — ABNORMAL HIGH (ref 5–40)

## 2023-03-15 LAB — T4, FREE: Free T4: 1.1 ng/dL (ref 0.82–1.77)

## 2023-03-15 LAB — COMPREHENSIVE METABOLIC PANEL
ALT: 10 IU/L (ref 0–32)
AST: 11 IU/L (ref 0–40)
Albumin/Globulin Ratio: 1.3 (ref 1.2–2.2)
Albumin: 4 g/dL (ref 3.9–4.9)
Alkaline Phosphatase: 150 IU/L — ABNORMAL HIGH (ref 44–121)
BUN/Creatinine Ratio: 6 — ABNORMAL LOW (ref 9–23)
BUN: 5 mg/dL — ABNORMAL LOW (ref 6–20)
Bilirubin Total: 0.3 mg/dL (ref 0.0–1.2)
CO2: 22 mmol/L (ref 20–29)
Calcium: 9.8 mg/dL (ref 8.7–10.2)
Chloride: 98 mmol/L (ref 96–106)
Creatinine, Ser: 0.86 mg/dL (ref 0.57–1.00)
Globulin, Total: 3.2 g/dL (ref 1.5–4.5)
Glucose: 185 mg/dL — ABNORMAL HIGH (ref 70–99)
Potassium: 4.1 mmol/L (ref 3.5–5.2)
Sodium: 135 mmol/L (ref 134–144)
Total Protein: 7.2 g/dL (ref 6.0–8.5)
eGFR: 90 mL/min/{1.73_m2} (ref >59)

## 2023-03-15 LAB — TSH: TSH: 0.928 u[IU]/mL (ref 0.450–4.500)

## 2023-03-16 ENCOUNTER — Ambulatory Visit: Payer: Medicaid Other | Admitting: "Endocrinology

## 2023-03-16 ENCOUNTER — Encounter: Payer: Self-pay | Admitting: "Endocrinology

## 2023-03-16 VITALS — BP 124/82 | HR 96 | Ht 67.5 in | Wt 326.6 lb

## 2023-03-16 DIAGNOSIS — E1165 Type 2 diabetes mellitus with hyperglycemia: Secondary | ICD-10-CM | POA: Diagnosis not present

## 2023-03-16 DIAGNOSIS — Z794 Long term (current) use of insulin: Secondary | ICD-10-CM

## 2023-03-16 DIAGNOSIS — E782 Mixed hyperlipidemia: Secondary | ICD-10-CM

## 2023-03-16 LAB — POCT GLYCOSYLATED HEMOGLOBIN (HGB A1C): HbA1c, POC (controlled diabetic range): 9.3 % — AB (ref 0.0–7.0)

## 2023-03-16 MED ORDER — DEXCOM G7 SENSOR MISC
2 refills | Status: DC
Start: 2023-03-16 — End: 2024-01-17

## 2023-03-16 MED ORDER — SEMAGLUTIDE (2 MG/DOSE) 8 MG/3ML ~~LOC~~ SOPN: 2.0000 mg | PEN_INJECTOR | SUBCUTANEOUS | 2 refills | Status: DC

## 2023-03-16 MED ORDER — NOVOLOG FLEXPEN 100 UNIT/ML ~~LOC~~ SOPN
20.0000 [IU] | PEN_INJECTOR | Freq: Three times a day (TID) | SUBCUTANEOUS | 1 refills | Status: DC | PRN
Start: 2023-03-16 — End: 2023-06-22

## 2023-03-16 MED ORDER — EMPAGLIFLOZIN 10 MG PO TABS
10.0000 mg | ORAL_TABLET | Freq: Every day | ORAL | 1 refills | Status: DC
Start: 2023-03-16 — End: 2023-06-22

## 2023-03-16 NOTE — Progress Notes (Signed)
03/16/2023, 2:35 PM  Endocrinology follow-up note   Subjective:    Patient ID: Judy Nelson, female    DOB: Apr 11, 1986.  Judy Nelson is being seen in follow-up after she was seen in consultation for management of currently uncontrolled symptomatic diabetes requested by  Lianne Moris, PA-C.  Patient was seen in April 2021 in consult for type 2 diabetes.  She did not return for follow-up.  Past Medical History:  Diagnosis Date   Anemia    Anxiety    Asthma    CSF leak    Depression    GERD (gastroesophageal reflux disease)    Heartburn 09/01/2015   no current med.   History of anemia 01/2014   Non-insulin dependent type 2 diabetes mellitus (HCC)    Obesity    PTSD (post-traumatic stress disorder)    Scar of breast 08/2015   left   Sinus headache     Past Surgical History:  Procedure Laterality Date   BREAST SURGERY Bilateral    bilateral breast surgery to remove cyst at [facility] 2017   CRANIOTOMY N/A 06/30/2017   Procedure: BIFRONTAL CRANIOTOMY;  Surgeon: Lisbeth Renshaw, MD;  Location: [facility] OR;  Service: Neurosurgery;  Laterality: N/A;  BIFRONTAL CRANIOTOMY Repair of Encephalocele   GANGLION CYST EXCISION Left 02/07/2023   Procedure: REMOVAL GANGLION OF WRIST;  Surgeon: Vickki Hearing, MD;  Location: [facility];  Service: Orthopedics;  Laterality: Left;  LMA   INCISION AND DRAINAGE ABSCESS Left 10/07/2014   Procedure: INCISION AND DRAINAGE LEFT BREAST ABSCESS;  Surgeon: Dalia Heading, MD;  Location: [facility];  Service: General;  Laterality: Left;   LAPAROSCOPIC OVARIAN CYSTECTOMY Right 02/05/2014   Procedure: LAPAROSCOPIC right retroperitoneal (NOT OVARIAN) CYSTECTOMY;  Surgeon: Lazaro Arms, MD;  Location: [facility];  Service: Gynecology;  Laterality: Right;   MASS EXCISION Left 09/07/2015   Procedure: Minor SCAR EXCISION LEFT BREAST, PLASTIC CLOSURE;  Surgeon: Louisa Second, MD;  Location: [facility];  Service: Plastics;  Laterality: Left;   TYMPANOSTOMY TUBE PLACEMENT Bilateral 10/17/2014    Social History   Socioeconomic History   Marital status: Single    Spouse name: Not on file   Number of children: Not on file   Years of education: Not on file   Highest education level: Not on file  Occupational History   Not on file  Tobacco Use   Smoking status: Every Day    Packs/day: 0.50    Years: 7.00    Additional pack years: 0.00    Total pack years: 3.50    Types: Cigarettes   Smokeless tobacco: Never  Vaping Use   Vaping Use: Never used  Substance and Sexual Activity   Alcohol use: Yes    Comment: occasionally   Drug use: No   Sexual activity: Yes    Birth control/protection: None  Other Topics Concern   Not on file  Social History Narrative   Not on file   Social Determinants of Health   Financial Resource Strain: Not on file  Food Insecurity: Not on file  Transportation Needs: Not on file  Physical Activity: Not on file  Stress: Not on file  Social Connections: Not on file    Family  History  Problem Relation Age of Onset   Diabetes Father    Hypertension Father    Heart disease Daughter        heart murmur   Cancer Maternal Grandmother        ovarian cancer   Obesity Paternal Grandmother    Diabetes Paternal Grandfather     Outpatient Encounter Medications as of 03/16/2023  Medication Sig   empagliflozin (JARDIANCE) 10 MG TABS tablet Take 1 tablet (10 mg total) by mouth daily before breakfast.   acetaminophen (TYLENOL) 500 MG tablet Take 1 tablet (500 mg total) by mouth every 6 (six) hours as needed.   albuterol (PROVENTIL HFA;VENTOLIN HFA) 108 (90 Base) MCG/ACT inhaler Inhale 2 puffs into the lungs every 6 (six) hours as needed for wheezing or shortness of breath.   ALPRAZolam (XANAX) 1 MG tablet TAKE 1 TABLET BY MOUTH THREE TIMES DAILY   amphetamine-dextroamphetamine (ADDERALL) 30 MG tablet Take 15 mg by mouth daily as needed  (focusing).   Bioflavonoid Products (VITAMIN C) CHEW Chew 2 tablets by mouth daily.   Continuous Glucose Receiver (DEXCOM G7 RECEIVER) DEVI Use to monitor BG continuously   Continuous Glucose Sensor (DEXCOM G7 SENSOR) MISC Change sensor every 10 days   DULoxetine (CYMBALTA) 60 MG capsule Take 60 mg by mouth 2 (two) times daily.   ELDERBERRY PO Take 2 tablets by mouth daily.   Emollient (GOLD BOND DIABETICS DRY SKIN) CREA Apply 1 Application topically daily as needed (pain). Uses with aspercreme   gabapentin (NEURONTIN) 800 MG tablet Take 800 mg by mouth 2 (two) times daily.   glipiZIDE (GLUCOTROL XL) 10 MG 24 hr tablet Take 10 mg by mouth daily with breakfast.   hydrOXYzine (ATARAX) 50 MG tablet Take 100 mg by mouth at bedtime.   ibuprofen (ADVIL) 800 MG tablet Take 1 tablet (800 mg total) by mouth every 8 (eight) hours as needed.   insulin aspart (NOVOLOG FLEXPEN) 100 UNIT/ML FlexPen Inject 20-26 Units into the skin 3 (three) times daily as needed for high blood sugar.   insulin glargine (LANTUS) 100 UNIT/ML injection Inject 80 Units into the skin at bedtime.   OVER THE COUNTER MEDICATION Take 1 Dose by mouth daily. [place]   oxyCODONE-acetaminophen (PERCOCET) 10-325 MG tablet Take 1 tablet by mouth every 4 (four) hours as needed for pain.   Phenylephrine-Acetaminophen (TYLENOL SINUS+HEADACHE PO) Take 2 tablets by mouth 2 (two) times daily as needed (sinus pain).   rosuvastatin (CRESTOR) 10 MG tablet Take 1 tablet (10 mg total) by mouth daily.   Semaglutide, 1 MG/DOSE, (OZEMPIC, 1 MG/DOSE,) 4 MG/3ML SOPN INJECT 1 MG INTO THE SKIN ONCE WEEK   silver sulfADIAZINE (SILVADENE) 1 % cream Use to area 2-3 times daily   solifenacin (VESICARE) 10 MG tablet Take 1 tablet (10 mg total) by mouth daily.   trolamine salicylate (ASPERCREME) 10 % cream Apply 1 Application topically as needed for muscle pain.   [DISCONTINUED] Continuous Glucose Sensor (DEXCOM G7 SENSOR) MISC Change sensor every 10 days    [DISCONTINUED] NOVOLOG FLEXPEN 100 UNIT/ML FlexPen INJECT 24 TO 30 UNITS INTO THE SKIN THREE TIMES DAILY BEFORE MEALS (Patient taking differently: Inject 20 Units into the skin 3 (three) times daily as needed for high blood sugar.)   No facility-administered encounter medications on file as of 03/16/2023.    ALLERGIES: Allergies  Allergen Reactions   Penicillin G Anaphylaxis and Swelling    Throat swelling    VACCINATION STATUS: Immunization History  Administered Date(s) Administered  Influenza,inj,Quad PF,6+ Mos 07/16/2013   Tdap 11/04/2013, 11/16/2021    Diabetes She presents for her follow-up diabetic visit. She has type 2 diabetes mellitus. Onset time: She was diagnosed at approximate age of 25 years. Her disease course has been worsening. There are no hypoglycemic associated symptoms. Pertinent negatives for hypoglycemia include no confusion, headaches, pallor or seizures. Associated symptoms include fatigue, polydipsia and polyuria. Pertinent negatives for diabetes include no blurred vision, no chest pain and no polyphagia. There are no hypoglycemic complications. Symptoms are worsening. Diabetic complications include peripheral neuropathy. Risk factors for coronary artery disease include dyslipidemia, diabetes mellitus, obesity, sedentary lifestyle, tobacco exposure and family history. Current diabetic treatment includes insulin injections. Her weight is fluctuating minimally. She is following a generally unhealthy diet. When asked about meal planning, she reported none. She has not had a previous visit with a dietitian. She never participates in exercise. Her home blood glucose trend is increasing steadily. Her overall blood glucose range is >200 mg/dl. (Judy Nelson presents with her Dexcom device with inadequate data to review.  Her point-of-care A1c 9 3% increasing from 8.8%.  She does not have hypoglycemia.      ) An ACE inhibitor/angiotensin II receptor blocker is not being taken.   Hyperlipidemia This is a chronic problem. The current episode started more than 1 year ago. The problem is uncontrolled. Exacerbating diseases include diabetes. Factors aggravating her hyperlipidemia include smoking. Pertinent negatives include no chest pain, myalgias or shortness of breath. She is currently on no antihyperlipidemic treatment. Risk factors for coronary artery disease include diabetes mellitus, dyslipidemia, family history, obesity, a sedentary lifestyle and hypertension.  Hypertension This is a chronic problem. The problem is controlled. Pertinent negatives include no blurred vision, chest pain, headaches, palpitations or shortness of breath. Risk factors for coronary artery disease include dyslipidemia, diabetes mellitus, smoking/tobacco exposure, sedentary lifestyle and family history. Past treatments include calcium channel blockers.       Objective:       03/16/2023    1:14 PM 02/07/2023    9:12 AM 02/07/2023    9:00 AM  Vitals with BMI  Height 5' 7.5"    Weight 326 lbs 10 oz    BMI 50.37    Systolic 124  103  Diastolic 82  64  Pulse 96 102 161    BP 124/82   Pulse 96   Ht 5' 7.5" (1.715 m)   Wt (!) 326 lb 9.6 oz (148.1 kg)   BMI 50.40 kg/m   Wt Readings from Last 3 Encounters:  03/16/23 (!) 326 lb 9.6 oz (148.1 kg)  02/07/23 (!) 324 lb 8.3 oz (147.2 kg)  02/03/23 (!) 324 lb 8.3 oz (147.2 kg)       CMP ( most recent) CMP     Component Value Date/Time   NA 135 03/14/2023 1441   K 4.1 03/14/2023 1441   CL 98 03/14/2023 1441   CO2 22 03/14/2023 1441   GLUCOSE 185 (H) 03/14/2023 1441   GLUCOSE 228 (H) 02/03/2023 1359   BUN 5 (L) 03/14/2023 1441   CREATININE 0.86 03/14/2023 1441   CREATININE 0.44 (L) 10/22/2013 1247   CALCIUM 9.8 03/14/2023 1441   PROT 7.2 03/14/2023 1441   ALBUMIN 4.0 03/14/2023 1441   AST 11 03/14/2023 1441   ALT 10 03/14/2023 1441   ALKPHOS 150 (H) 03/14/2023 1441   BILITOT 0.3 03/14/2023 1441   GFRNONAA >60 02/03/2023 1359    GFRAA >60 03/12/2018 1315     Diabetic Labs (  most recent): Lab Results  Component Value Date   HGBA1C 9.3 (A) 03/16/2023   HGBA1C 8.8 (A) 12/12/2022   HGBA1C 9.7 (A) 08/11/2022     Lipid Panel ( most recent) Lipid Panel     Component Value Date/Time   CHOL 252 (H) 03/14/2023 1441   TRIG 319 (H) 03/14/2023 1441   HDL 38 (L) 03/14/2023 1441   CHOLHDL 6.6 (H) 03/14/2023 1441   LDLCALC 154 (H) 03/14/2023 1441   LABVLDL 60 (H) 03/14/2023 1441     Lab Results  Component Value Date   TSH 0.928 03/14/2023   TSH 0.910 08/10/2022   TSH 1.41 02/09/2022   TSH 1.580 07/20/2015   TSH 1.250 10/05/2014   FREET4 1.10 03/14/2023   FREET4 1.00 08/10/2022      Assessment & Plan:   1. Uncontrolled type 2 diabetes mellitus with hyperglycemia (HCC)  - Lanah Hagadone has currently uncontrolled symptomatic type 2 DM since  37 years of age.  Judy Nelson presents without any meter nor suggest.  She says that her insurance stopped paying for her freestyle libre.  Her point-of-care A1c was 8.8%, improving from 10.7% overall.  She denies hypoglycemia.  The limited data that she has on her Dexcom shows 100% in range, no hypoglycemia.   Recent labs reviewed. -She presents with persistent hyperglycemia. -her diabetes is complicated by peripheral neuropathy, obesity/sedentary life, smoking and she remains at a high risk for more acute and chronic complications which include CAD, CVA, CKD, retinopathy, and neuropathy. These are all discussed in detail with her.  In light of her comorbid conditions of obesity, type 2 diabetes, hyperlipidemia, hypertension, she is a candidate for lifestyle medicine.  -She did not engage optimally to lifestyle medicine. - she acknowledges that there is a room for improvement in her food and drink choices. - Suggestion is made for her to avoid simple carbohydrates  from her diet including Cakes, Sweet Desserts, Ice Cream, Soda (diet and regular), Sweet Tea, Candies,  Chips, Cookies, Store Bought Juices, Alcohol , Artificial Sweeteners,  Coffee Creamer, and "Sugar-free" Products, Lemonade. This will help patient to have more stable blood glucose profile and potentially avoid unintended weight gain.  The following Lifestyle Medicine recommendations according to American College of Lifestyle Medicine  [facility]) were discussed and and offered to patient and she  agrees to start the journey:  A. Whole Foods, Plant-Based Nutrition comprising of fruits and vegetables, plant-based proteins, whole-grain carbohydrates was discussed in detail with the patient.   A list for source of those nutrients were also provided to the patient.  Patient will use only water or unsweetened tea for hydration. B.  The need to stay away from risky substances including alcohol, smoking; obtaining 7 to 9 hours of restorative sleep, at least 150 minutes of moderate intensity exercise weekly, the importance of healthy social connections,  and stress management techniques were discussed. C.  A full color page of  Calorie density of various food groups per pound showing examples of each food groups was provided to the patient.   - she will be scheduled with Norm Salt, RDN, CDE for diabetes education.  - I have approached her with the following individualized plan to manage  her diabetes and patient agrees:   -Even while she is working on her lifestyle, based on  her chronic glycemic burden, she will continue to need intensive treatment with basal/bolus insulin  in order for her to achieve control of diabetes to target.    -Accordingly,  she is advised to continue Lantus 80 units nightly, continue NovoLog 20-26  units 3 times daily AC for Premeal blood glucose readings above 90 mg per DL.  - she is urged to use her CGM continuously, and she is warned not to take insulin without proper monitoring per orders.  - she is encouraged to call clinic for blood glucose levels less than 70 or above 200  mg /dl. - she is advised to continue glipizide 10 mg XL p.o. daily at breakfast. -She is benefiting from Ozempic.  I discussed and increase her Ozempic to 2 mg subcutaneously weekly.   -She will also benefit from SGLT2 inhibitors.  I discussed and added Jardiance 10 mg p.o. daily at breakfast.  Side effects and precautions discussed with her.    - Specific targets for  A1c;  LDL, HDL,  and Triglycerides were discussed with the patient.  2) Blood Pressure /Hypertension:  -Her blood pressure is controlled to target.  3) Lipids/Hyperlipidemia:   Review of her recent lipid panel showed  uncontrolled  LDL worsening at 154.  She has not been taking Crestor due to side effects.  I advised her to break Crestor in half to make 5 mg to take every other day at bedtime.   Side effects and precautions discussed with her. The above described WF PB diet will address dyslipidemia as well.     4)  Weight/Diet:  Body mass index is 50.4 kg/m.  -She presents with steady weight since last visit.  Her BMI is high, clearly complicating her diabetes care.   she is  a candidate for modest weight loss. I discussed with her the fact that loss of 5 - 10% of her  current body weight will have the most impact on her diabetes management.  Exercise, and detailed carbohydrates information provided  -  detailed on discharge instructions.  5) Chronic Care/Health Maintenance:  -she  is not on ACEI/ARB and Statin medications and  is encouraged to initiate and continue to follow up with Ophthalmology, Dentist,  Podiatrist at least yearly or according to recommendations, and advised to  quit smoking. I have recommended yearly flu vaccine and pneumonia vaccine at least every 5 years; moderate intensity exercise for up to 150 minutes weekly; and  sleep for at least 7 hours a day.  - she is  advised to maintain close follow up with Lianne Moris, PA-C for primary care needs, as well as her other providers for optimal and coordinated  care.   I spent  26  minutes in the care of the patient today including review of labs from CMP, Lipids, Thyroid Function, Hematology (current and previous including abstractions from other facilities); face-to-face time discussing  her blood glucose readings/logs, discussing hypoglycemia and hyperglycemia episodes and symptoms, medications doses, her options of short and long term treatment based on the latest standards of care / guidelines;  discussion about incorporating lifestyle medicine;  and documenting the encounter. Risk reduction counseling performed per USPSTF guidelines to reduce  obesity and cardiovascular risk factors.     Please refer to Patient Instructions for Blood Glucose Monitoring and Insulin/Medications Dosing Guide"  in media tab for additional information. Please  also refer to " Patient Self Inventory" in the Media  tab for reviewed elements of pertinent patient history.  [name] participated in the discussions, expressed understanding, and voiced agreement with the above plans.  All questions were answered to her satisfaction. she is encouraged to contact clinic should she have any  questions or concerns prior to her return visit.    Follow up plan: - Return in about 3 months (around 06/16/2023) for Bring Meter/CGM Device/Logs- A1c in Office.  Marquis Lunch, MD [facility] [address] [place], [place] [address] Phone: [phone]  Fax: [phone]    03/16/2023, 2:35 PM  This note was partially dictated with voice recognition software. Similar sounding words can be transcribed inadequately or may not  be corrected upon review.

## 2023-03-16 NOTE — Patient Instructions (Signed)
                                     Advice for Weight Management  -For most of us the best way to lose weight is by diet management. Generally speaking, diet management means consuming less calories intentionally which over time brings about progressive weight loss.  This can be achieved more effectively by avoiding ultra processed carbohydrates, processed meats, unhealthy fats.    It is critically important to know your numbers: how much calorie you are consuming and how much calorie you need. More importantly, our carbohydrates sources should be unprocessed naturally occurring  complex starch food items.  It is always important to balance nutrition also by  appropriate intake of proteins (mainly plant-based), healthy fats/oils, plenty of fruits and vegetables.   -The American College of Lifestyle Medicine (ACL M) recommends nutrition derived mostly from Whole Food, Plant Predominant Sources example an apple instead of applesauce or apple pie. Eat Plenty of vegetables, Mushrooms, fruits, Legumes, Whole Grains, Nuts, seeds in lieu of processed meats, processed snacks/pastries red meat, poultry, eggs.  Use only water or unsweetened tea for hydration.  The College also recommends the need to stay away from risky substances including alcohol, smoking; obtaining 7-9 hours of restorative sleep, at least 150 minutes of moderate intensity exercise weekly, importance of healthy social connections, and being mindful of stress and seek help when it is overwhelming.    -Sticking to a routine mealtime to eat 3 meals a day and avoiding unnecessary snacks is shown to have a big role in weight control. Under normal circumstances, the only time we burn stored energy is when we are hungry, so allow  some hunger to take place- hunger means no food between appropriate meal times, only water.  It is not advisable to starve.   -It is better to avoid simple carbohydrates including:  Cakes, Sweet Desserts, Ice Cream, Soda (diet and regular), Sweet Tea, Candies, Chips, Cookies, Store Bought Juices, Alcohol in Excess of  1-2 drinks a day, Lemonade,  Artificial Sweeteners, Doughnuts, Coffee Creamers, "Sugar-free" Products, etc, etc.  This is not a complete list.....    -Consulting with certified diabetes educators is proven to provide you with the most accurate and current information on diet.  Also, you may be  interested in discussing diet options/exchanges , we can schedule a visit with Judy Nelson, RDN, CDE for individualized nutrition education.  -Exercise: If you are able: 30 -60 minutes a day ,4 days a week, or 150 minutes of moderate intensity exercise weekly.    The longer the better if tolerated.  Combine stretch, strength, and aerobic activities.  If you were told in the past that you have high risk for cardiovascular diseases, or if you are currently symptomatic, you may seek evaluation by your heart doctor prior to initiating moderate to intense exercise programs.                                  Additional Care Considerations for Diabetes/Prediabetes   -Diabetes  is a chronic disease.  The most important care consideration is regular follow-up with your diabetes care provider with the goal being avoiding or delaying its complications and to take advantage of advances in medications and technology.  If appropriate actions are taken early enough, type 2 diabetes can even be   reversed.  Seek information from the right source.  - Whole Food, Plant Predominant Nutrition is highly recommended: Eat Plenty of vegetables, Mushrooms, fruits, Legumes, Whole Grains, Nuts, seeds in lieu of processed meats, processed snacks/pastries red meat, poultry, eggs as recommended by American College of  Lifestyle Medicine (ACLM).  -Type 2 diabetes is known to coexist with other important comorbidities such as high blood pressure and high cholesterol.  It is critical to control not only the  diabetes but also the high blood pressure and high cholesterol to minimize and delay the risk of complications including coronary artery disease, stroke, amputations, blindness, etc.  The good news is that this diet recommendation for type 2 diabetes is also very helpful for managing high cholesterol and high blood blood pressure.  - Studies showed that people with diabetes will benefit from a class of medications known as ACE inhibitors and statins.  Unless there are specific reasons not to be on these medications, the standard of care is to consider getting one from these groups of medications at an optimal doses.  These medications are generally considered safe and proven to help protect the heart and the kidneys.    - People with diabetes are encouraged to initiate and maintain regular follow-up with eye doctors, foot doctors, dentists , and if necessary heart and kidney doctors.     - It is highly recommended that people with diabetes quit smoking or stay away from smoking, and get yearly  flu vaccine and pneumonia vaccine at least every 5 years.  See above for additional recommendations on exercise, sleep, stress management , and healthy social connections.      

## 2023-04-29 ENCOUNTER — Other Ambulatory Visit: Payer: Self-pay

## 2023-04-29 ENCOUNTER — Encounter (HOSPITAL_COMMUNITY): Payer: Self-pay

## 2023-04-29 ENCOUNTER — Emergency Department (HOSPITAL_COMMUNITY)
Admission: EM | Admit: 2023-04-29 | Discharge: 2023-04-29 | Discharge status: Against Medical Advice | Payer: Medicaid Other | Attending: Emergency Medicine | Admitting: Emergency Medicine

## 2023-04-29 DIAGNOSIS — Z5321 Procedure and treatment not carried out due to patient leaving prior to being seen by health care provider: Secondary | ICD-10-CM | POA: Insufficient documentation

## 2023-04-29 DIAGNOSIS — L0201 Cutaneous abscess of face: Secondary | ICD-10-CM | POA: Insufficient documentation

## 2023-04-29 DIAGNOSIS — R220 Localized swelling, mass and lump, head: Secondary | ICD-10-CM | POA: Diagnosis present

## 2023-04-29 NOTE — ED Triage Notes (Signed)
Pt reports  Abscess On left upper gum It popped last night Swelling facial Left side

## 2023-06-08 ENCOUNTER — Other Ambulatory Visit (HOSPITAL_COMMUNITY): Payer: Self-pay

## 2023-06-13 ENCOUNTER — Telehealth: Payer: Self-pay | Admitting: "Endocrinology

## 2023-06-13 NOTE — Telephone Encounter (Signed)
Sent medical record request for disability to [facility]

## 2023-06-22 ENCOUNTER — Encounter: Payer: Self-pay | Admitting: "Endocrinology

## 2023-06-22 ENCOUNTER — Ambulatory Visit: Payer: Medicaid Other | Admitting: "Endocrinology

## 2023-06-22 VITALS — BP 136/82 | HR 88 | Ht 67.5 in | Wt 324.2 lb

## 2023-06-22 DIAGNOSIS — E1165 Type 2 diabetes mellitus with hyperglycemia: Secondary | ICD-10-CM | POA: Diagnosis not present

## 2023-06-22 DIAGNOSIS — Z794 Long term (current) use of insulin: Secondary | ICD-10-CM

## 2023-06-22 DIAGNOSIS — E782 Mixed hyperlipidemia: Secondary | ICD-10-CM

## 2023-06-22 LAB — POCT GLYCOSYLATED HEMOGLOBIN (HGB A1C): HbA1c, POC (controlled diabetic range): 8.1 % — AB (ref 0.0–7.0)

## 2023-06-22 MED ORDER — GLIPIZIDE ER 5 MG PO TB24: 5.0000 mg | ORAL_TABLET | Freq: Every day | ORAL | 1 refills | Status: DC

## 2023-06-22 MED ORDER — SEMAGLUTIDE (2 MG/DOSE) 8 MG/3ML ~~LOC~~ SOPN: 2.0000 mg | PEN_INJECTOR | SUBCUTANEOUS | 2 refills | Status: DC

## 2023-06-22 MED ORDER — DAPAGLIFLOZIN PROPANEDIOL 5 MG PO TABS
5.0000 mg | ORAL_TABLET | Freq: Every day | ORAL | 1 refills | Status: DC
Start: 2023-06-22 — End: 2023-10-25

## 2023-06-22 MED ORDER — NOVOLOG FLEXPEN 100 UNIT/ML ~~LOC~~ SOPN: 10.0000 [IU] | PEN_INJECTOR | Freq: Three times a day (TID) | SUBCUTANEOUS | 1 refills | Status: DC

## 2023-06-22 NOTE — Progress Notes (Signed)
06/22/2023, 7:53 PM  Endocrinology follow-up note   Subjective:    Patient ID: Judy Nelson, female    DOB: 26-Sep-1986.  Judy Nelson is being seen in follow-up after she was seen in consultation for management of currently uncontrolled symptomatic diabetes requested by  Lianne Moris, PA-C.  Patient was seen in April 2021 in consult for type 2 diabetes.  She did not return for follow-up.  Past Medical History:  Diagnosis Date   Anemia    Anxiety    Asthma    CSF leak    Depression    GERD (gastroesophageal reflux disease)    Heartburn 09/01/2015   no current med.   History of anemia 01/2014   Non-insulin dependent type 2 diabetes mellitus (HCC)    Obesity    PTSD (post-traumatic stress disorder)    Scar of breast 08/2015   left   Sinus headache     Past Surgical History:  Procedure Laterality Date   BREAST SURGERY Bilateral    bilateral breast surgery to remove cyst at [facility] 2017   CRANIOTOMY N/A 06/30/2017   Procedure: BIFRONTAL CRANIOTOMY;  Surgeon: Lisbeth Renshaw, MD;  Location: [facility] OR;  Service: Neurosurgery;  Laterality: N/A;  BIFRONTAL CRANIOTOMY Repair of Encephalocele   GANGLION CYST EXCISION Left 02/07/2023   Procedure: REMOVAL GANGLION OF WRIST;  Surgeon: Vickki Hearing, MD;  Location: [facility];  Service: Orthopedics;  Laterality: Left;  LMA   INCISION AND DRAINAGE ABSCESS Left 10/07/2014   Procedure: INCISION AND DRAINAGE LEFT BREAST ABSCESS;  Surgeon: Dalia Heading, MD;  Location: [facility];  Service: General;  Laterality: Left;   LAPAROSCOPIC OVARIAN CYSTECTOMY Right 02/05/2014   Procedure: LAPAROSCOPIC right retroperitoneal (NOT OVARIAN) CYSTECTOMY;  Surgeon: Lazaro Arms, MD;  Location: [facility];  Service: Gynecology;  Laterality: Right;   MASS EXCISION Left 09/07/2015   Procedure: Minor SCAR EXCISION LEFT BREAST, PLASTIC CLOSURE;  Surgeon: Louisa Second, MD;  Location: [facility];  Service: Plastics;  Laterality: Left;   TYMPANOSTOMY TUBE PLACEMENT Bilateral 10/17/2014    Social History   Socioeconomic History   Marital status: Single    Spouse name: Not on file   Number of children: Not on file   Years of education: Not on file   Highest education level: Not on file  Occupational History   Not on file  Tobacco Use   Smoking status: Every Day    Current packs/day: 0.50    Average packs/day: 0.5 packs/day for 7.0 years (3.5 ttl pk-yrs)    Types: Cigarettes   Smokeless tobacco: Never  Vaping Use   Vaping status: Never Used  Substance and Sexual Activity   Alcohol use: Yes    Comment: occasionally   Drug use: Yes    Types: Marijuana    Comment: thc gummies   Sexual activity: Yes    Birth control/protection: None  Other Topics Concern   Not on file  Social History Narrative   Not on file   Social Determinants of Health   Financial Resource Strain: Not on file  Food Insecurity: Not on file  Transportation Needs: Not on file  Physical Activity: Not on file  Stress: Not on file  Social Connections: Not  on file    Family History  Problem Relation Age of Onset   Diabetes Father    Hypertension Father    Heart disease Daughter        heart murmur   Cancer Maternal Grandmother        ovarian cancer   Obesity Paternal Grandmother    Diabetes Paternal Grandfather     Outpatient Encounter Medications as of 06/22/2023  Medication Sig   dapagliflozin propanediol (FARXIGA) 5 MG TABS tablet Take 1 tablet (5 mg total) by mouth daily before breakfast.   acetaminophen (TYLENOL) 500 MG tablet Take 1 tablet (500 mg total) by mouth every 6 (six) hours as needed.   albuterol (PROVENTIL HFA;VENTOLIN HFA) 108 (90 Base) MCG/ACT inhaler Inhale 2 puffs into the lungs every 6 (six) hours as needed for wheezing or shortness of breath.   ALPRAZolam (XANAX) 1 MG tablet TAKE 1 TABLET BY MOUTH THREE TIMES DAILY   amphetamine-dextroamphetamine  (ADDERALL) 30 MG tablet Take 15 mg by mouth daily as needed (focusing).   Bioflavonoid Products (VITAMIN C) CHEW Chew 2 tablets by mouth daily.   Continuous Glucose Receiver (DEXCOM G7 RECEIVER) DEVI Use to monitor BG continuously   Continuous Glucose Sensor (DEXCOM G7 SENSOR) MISC Change sensor every 10 days   DULoxetine (CYMBALTA) 60 MG capsule Take 60 mg by mouth 2 (two) times daily.   ELDERBERRY PO Take 2 tablets by mouth daily.   Emollient (GOLD BOND DIABETICS DRY SKIN) CREA Apply 1 Application topically daily as needed (pain). Uses with aspercreme   gabapentin (NEURONTIN) 800 MG tablet Take 800 mg by mouth 2 (two) times daily.   glipiZIDE (GLUCOTROL XL) 5 MG 24 hr tablet Take 1 tablet (5 mg total) by mouth daily with breakfast.   hydrOXYzine (ATARAX) 50 MG tablet Take 100 mg by mouth at bedtime.   ibuprofen (ADVIL) 800 MG tablet Take 1 tablet (800 mg total) by mouth every 8 (eight) hours as needed.   insulin aspart (NOVOLOG FLEXPEN) 100 UNIT/ML FlexPen Inject 10-16 Units into the skin 3 (three) times daily before meals.   insulin glargine (LANTUS) 100 UNIT/ML injection Inject 70 Units into the skin at bedtime.   OVER THE COUNTER MEDICATION Take 1 Dose by mouth daily. [place]   oxyCODONE-acetaminophen (PERCOCET) 10-325 MG tablet Take 1 tablet by mouth every 4 (four) hours as needed for pain.   Phenylephrine-Acetaminophen (TYLENOL SINUS+HEADACHE PO) Take 2 tablets by mouth 2 (two) times daily as needed (sinus pain).   rosuvastatin (CRESTOR) 10 MG tablet Take 1 tablet (10 mg total) by mouth daily.   Semaglutide, 2 MG/DOSE, 8 MG/3ML SOPN Inject 2 mg as directed once a week.   silver sulfADIAZINE (SILVADENE) 1 % cream Use to area 2-3 times daily   solifenacin (VESICARE) 10 MG tablet Take 1 tablet (10 mg total) by mouth daily.   trolamine salicylate (ASPERCREME) 10 % cream Apply 1 Application topically as needed for muscle pain.   [DISCONTINUED] empagliflozin (JARDIANCE) 10 MG TABS tablet Take  1 tablet (10 mg total) by mouth daily before breakfast.   [DISCONTINUED] glipiZIDE (GLUCOTROL XL) 10 MG 24 hr tablet Take 10 mg by mouth daily with breakfast.   [DISCONTINUED] insulin aspart (NOVOLOG FLEXPEN) 100 UNIT/ML FlexPen Inject 20-26 Units into the skin 3 (three) times daily as needed for high blood sugar.   [DISCONTINUED] Semaglutide, 2 MG/DOSE, 8 MG/3ML SOPN Inject 2 mg as directed once a week.   No facility-administered encounter medications on file as of 06/22/2023.  ALLERGIES: Allergies  Allergen Reactions   Penicillin G Anaphylaxis and Swelling    Throat swelling    VACCINATION STATUS: Immunization History  Administered Date(s) Administered   Influenza,inj,Quad PF,6+ Mos 07/16/2013   Tdap 11/04/2013, 11/16/2021    Diabetes She presents for her follow-up diabetic visit. She has type 2 diabetes mellitus. Onset time: She was diagnosed at approximate age of 25 years. Her disease course has been improving. There are no hypoglycemic associated symptoms. Pertinent negatives for hypoglycemia include no confusion, headaches, pallor or seizures. Associated symptoms include fatigue, polydipsia and polyuria. Pertinent negatives for diabetes include no blurred vision, no chest pain and no polyphagia. There are no hypoglycemic complications. Symptoms are improving. Diabetic complications include peripheral neuropathy. Risk factors for coronary artery disease include dyslipidemia, diabetes mellitus, obesity, sedentary lifestyle, tobacco exposure and family history. Current diabetic treatment includes insulin injections. Her weight is decreasing steadily. She is following a generally unhealthy diet. When asked about meal planning, she reported none. She has not had a previous visit with a dietitian. She never participates in exercise. Her home blood glucose trend is decreasing steadily. Her breakfast blood glucose range is generally 140-180 mg/dl. Her lunch blood glucose range is generally  140-180 mg/dl. Her dinner blood glucose range is generally 140-180 mg/dl. Her bedtime blood glucose range is generally 140-180 mg/dl. Her overall blood glucose range is 140-180 mg/dl. (Judy Nelson presents with her Dexcom device with significant improvement in her glycemic profile.  Her Dexcom shows 51% time in range, 39% level 1 hyperglycemia, 9% level 2 hyperglycemia.  She does not have significant hypoglycemia.  Her point-of-care A1c is 8.1%, progressively improving from 10.7%.    ) An ACE inhibitor/angiotensin II receptor blocker is not being taken.  Hyperlipidemia This is a chronic problem. The current episode started more than 1 year ago. The problem is uncontrolled. Exacerbating diseases include diabetes. Factors aggravating her hyperlipidemia include smoking. Pertinent negatives include no chest pain, myalgias or shortness of breath. She is currently on no antihyperlipidemic treatment. Risk factors for coronary artery disease include diabetes mellitus, dyslipidemia, family history, obesity, a sedentary lifestyle and hypertension.  Hypertension This is a chronic problem. The problem is controlled. Pertinent negatives include no blurred vision, chest pain, headaches, palpitations or shortness of breath. Risk factors for coronary artery disease include dyslipidemia, diabetes mellitus, smoking/tobacco exposure, sedentary lifestyle and family history. Past treatments include calcium channel blockers.       Objective:       06/22/2023    3:12 PM 06/22/2023    2:52 PM 04/29/2023    2:48 PM  Vitals with BMI  Height  5' 7.5"   Weight  324 lbs 3 oz   BMI  50   Systolic 136 142 244  Diastolic 82 92 84  Pulse  88 88    BP 136/82 Comment: R arm with manuel cuff  Pulse 88   Ht 5' 7.5" (1.715 m)   Wt (!) 324 lb 3.2 oz (147.1 kg)   BMI 50.03 kg/m   Wt Readings from Last 3 Encounters:  06/22/23 (!) 324 lb 3.2 oz (147.1 kg)  04/29/23 (!) 340 lb (154.2 kg)  03/16/23 (!) 326 lb 9.6 oz (148.1 kg)        CMP ( most recent) CMP     Component Value Date/Time   NA 135 03/14/2023 1441   K 4.1 03/14/2023 1441   CL 98 03/14/2023 1441   CO2 22 03/14/2023 1441   GLUCOSE 185 (H) 03/14/2023 1441  GLUCOSE 228 (H) 02/03/2023 1359   BUN 5 (L) 03/14/2023 1441   CREATININE 0.86 03/14/2023 1441   CREATININE 0.44 (L) 10/22/2013 1247   CALCIUM 9.8 03/14/2023 1441   PROT 7.2 03/14/2023 1441   ALBUMIN 4.0 03/14/2023 1441   AST 11 03/14/2023 1441   ALT 10 03/14/2023 1441   ALKPHOS 150 (H) 03/14/2023 1441   BILITOT 0.3 03/14/2023 1441   GFRNONAA >60 02/03/2023 1359   GFRAA >60 03/12/2018 1315     Diabetic Labs (most recent): Lab Results  Component Value Date   HGBA1C 8.1 (A) 06/22/2023   HGBA1C 9.3 (A) 03/16/2023   HGBA1C 8.8 (A) 12/12/2022     Lipid Panel ( most recent) Lipid Panel     Component Value Date/Time   CHOL 252 (H) 03/14/2023 1441   TRIG 319 (H) 03/14/2023 1441   HDL 38 (L) 03/14/2023 1441   CHOLHDL 6.6 (H) 03/14/2023 1441   LDLCALC 154 (H) 03/14/2023 1441   LABVLDL 60 (H) 03/14/2023 1441     Lab Results  Component Value Date   TSH 0.928 03/14/2023   TSH 0.910 08/10/2022   TSH 1.41 02/09/2022   TSH 1.580 07/20/2015   TSH 1.250 10/05/2014   FREET4 1.10 03/14/2023   FREET4 1.00 08/10/2022      Assessment & Plan:   1. Uncontrolled type 2 diabetes mellitus with hyperglycemia (HCC)  - Judy Nelson has currently uncontrolled symptomatic type 2 DM since  37 years of age.  Judy Nelson presents with her Dexcom device with significant improvement in her glycemic profile.  Her Dexcom shows 51% time in range, 39% level 1 hyperglycemia, 9% level 2 hyperglycemia.  She does not have significant hypoglycemia.  Her point-of-care A1c is 8.1%, progressively improving from 10.7%.   Recent labs reviewed. -She presents with persistent hyperglycemia. -her diabetes is complicated by peripheral neuropathy, obesity/sedentary life, smoking and she remains at a high risk for  more acute and chronic complications which include CAD, CVA, CKD, retinopathy, and neuropathy. These are all discussed in detail with her.  In light of her comorbid conditions of obesity, type 2 diabetes, hyperlipidemia, hypertension, she is a candidate for lifestyle medicine.  -She did not engage optimally to lifestyle medicine.  - she acknowledges that there is a room for improvement in her food and drink choices. - Suggestion is made for her to avoid simple carbohydrates  from her diet including Cakes, Sweet Desserts, Ice Cream, Soda (diet and regular), Sweet Tea, Candies, Chips, Cookies, Store Bought Juices, Alcohol , Artificial Sweeteners,  Coffee Creamer, and "Sugar-free" Products, Lemonade. This will help patient to have more stable blood glucose profile and potentially avoid unintended weight gain.  The following Lifestyle Medicine recommendations according to American College of Lifestyle Medicine  [facility]) were discussed and and offered to patient and she  agrees to start the journey:  A. Whole Foods, Plant-Based Nutrition comprising of fruits and vegetables, plant-based proteins, whole-grain carbohydrates was discussed in detail with the patient.   A list for source of those nutrients were also provided to the patient.  Patient will use only water or unsweetened tea for hydration. B.  The need to stay away from risky substances including alcohol, smoking; obtaining 7 to 9 hours of restorative sleep, at least 150 minutes of moderate intensity exercise weekly, the importance of healthy social connections,  and stress management techniques were discussed. C.  A full color page of  Calorie density of various food groups per pound showing examples of each  food groups was provided to the patient.   - she will be scheduled with Norm Salt, RDN, CDE for diabetes education.  - I have approached her with the following individualized plan to manage  her diabetes and patient agrees:   -Even  while she is working on her lifestyle, based on  her chronic glycemic burden, she will continue to need intensive treatment with basal/bolus insulin  in order for her to achieve control of diabetes to target.    -Evidently, she can benefit from de-escalation on her insulin doses.  I advised her to lower her Lantus to 70 units nightly, lower NovoLog to 10 -16  units 3 times daily AC for Premeal blood glucose readings above 90 mg per DL.  - she is urged to use her CGM continuously, and she is warned not to take insulin without proper monitoring per orders.  - she is encouraged to call clinic for blood glucose levels less than 70 or above 200 mg /dl. - she is lower glipizide to 5 mg XL p.o. daily at breakfast. -She has been benefiting from Ozempic, advised to resume Ozempic 2 mg subcutaneously weekly.   -She has not obtain coverage for Jardiance.  I discussed and offered alternative SGLT2 inhibitor, Farxiga 5 mg p.o. daily at breakfast.  Side effects and precautions discussed with her.  - Specific targets for  A1c;  LDL, HDL,  and Triglycerides were discussed with the patient.  2) Blood Pressure /Hypertension:  -Her blood pressure is controlled to target.  3) Lipids/Hyperlipidemia:   Review of her recent lipid panel showed  uncontrolled  LDL worsening at 154.  She has not been taking Crestor due to side effects.  She will be considered for Repatha injection if her LDL remains above 130 mg per DL next visit.   The above described WF PB diet will address dyslipidemia as well.     4)  Weight/Diet:  Body mass index is 50.03 kg/m.  -She presents with steady weight since last visit.  Her BMI is high, clearly complicating her diabetes care.   she is  a candidate for modest weight loss. I discussed with her the fact that loss of 5 - 10% of her  current body weight will have the most impact on her diabetes management.  Exercise, and detailed carbohydrates information provided  -  detailed on discharge  instructions.  5) Chronic Care/Health Maintenance:  -she  is not on ACEI/ARB and Statin medications and  is encouraged to initiate and continue to follow up with Ophthalmology, Dentist,  Podiatrist at least yearly or according to recommendations, and advised to  quit smoking. I have recommended yearly flu vaccine and pneumonia vaccine at least every 5 years; moderate intensity exercise for up to 150 minutes weekly; and  sleep for at least 7 hours a day.  - she is  advised to maintain close follow up with Lianne Moris, PA-C for primary care needs, as well as her other providers for optimal and coordinated care.   I spent  26 minutes in the care of the patient today including review of labs from CMP, Lipids, Thyroid Function, Hematology (current and previous including abstractions from other facilities); face-to-face time discussing  her blood glucose readings/logs, discussing hypoglycemia and hyperglycemia episodes and symptoms, medications doses, her options of short and long term treatment based on the latest standards of care / guidelines;  discussion about incorporating lifestyle medicine;  and documenting the encounter. Risk reduction counseling performed per USPSTF guidelines to  reduce  obesity and cardiovascular risk factors.     Please refer to Patient Instructions for Blood Glucose Monitoring and Insulin/Medications Dosing Guide"  in media tab for additional information. Please  also refer to " Patient Self Inventory" in the Media  tab for reviewed elements of pertinent patient history.  [name] participated in the discussions, expressed understanding, and voiced agreement with the above plans.  All questions were answered to her satisfaction. she is encouraged to contact clinic should she have any questions or concerns prior to her return visit.    Follow up plan: - Return in about 4 months (around 10/22/2023) for F/U with Pre-visit Labs, Meter/CGM/Logs, A1c here.  Marquis Lunch,  MD [facility] [address] [place], [place] [address] Phone: [phone]  Fax: [phone]    06/22/2023, 7:53 PM  This note was partially dictated with voice recognition software. Similar sounding words can be transcribed inadequately or may not  be corrected upon review.

## 2023-06-22 NOTE — Patient Instructions (Signed)

## 2023-07-18 ENCOUNTER — Other Ambulatory Visit (HOSPITAL_COMMUNITY): Payer: Self-pay

## 2023-07-18 ENCOUNTER — Telehealth: Payer: Self-pay

## 2023-07-18 NOTE — Telephone Encounter (Signed)
Pharmacy Patient Advocate Encounter   Received notification from CoverMyMeds that prior authorization for Marcelline Deist is required/requested.   Per test claim: PA required; PA submitted to [facility] via CoverMyMeds Key/confirmation #/EOC [id number] Status is pending

## 2023-07-19 ENCOUNTER — Telehealth: Payer: Self-pay

## 2023-07-19 NOTE — Telephone Encounter (Signed)
Spoke with pt advising she reduce her dose of Ozempic to 1mg  per Dr.Nida's orders. Pt voiced understanding.

## 2023-07-19 NOTE — Telephone Encounter (Signed)
Pt called stating she has been taking Ozempic but has been experiencing slow digestion, nausea. Requested Rx for zofran.

## 2023-07-26 ENCOUNTER — Other Ambulatory Visit: Payer: Self-pay | Admitting: Family Medicine

## 2023-07-26 DIAGNOSIS — G9601 Cranial cerebrospinal fluid leak, spontaneous: Secondary | ICD-10-CM

## 2023-08-01 NOTE — Telephone Encounter (Signed)
Pharmacy Patient Advocate Encounter  Received notification from [facility] that Prior Authorization for Marcelline Deist has been APPROVED through 07/17/2024   PA #/Case ID/Reference #: [id number]

## 2023-09-05 ENCOUNTER — Telehealth: Payer: Self-pay

## 2023-09-05 ENCOUNTER — Other Ambulatory Visit (HOSPITAL_COMMUNITY): Payer: Self-pay

## 2023-09-05 NOTE — Telephone Encounter (Signed)
Pharmacy Patient Advocate Encounter   Received notification from CoverMyMeds that prior authorization for Dexcom G7 Sensor is required/requested.   Insurance verification completed.   The patient is insured through [facility].   Per test claim: PA required; PA submitted to above mentioned insurance via CoverMyMeds Key/confirmation #/EOC [id number] Status is pending

## 2023-09-06 ENCOUNTER — Ambulatory Visit
Admission: RE | Admit: 2023-09-06 | Discharge: 2023-09-06 | Disposition: A | Discharge status: Home / Self Care | Payer: Medicaid Other | Source: Ambulatory Visit | Attending: Family Medicine | Admitting: Family Medicine

## 2023-09-06 DIAGNOSIS — G9601 Cranial cerebrospinal fluid leak, spontaneous: Secondary | ICD-10-CM

## 2023-09-06 MED ORDER — GADOPICLENOL 0.5 MMOL/ML IV SOLN
10.0000 mL | Freq: Once | INTRAVENOUS | Status: AC | PRN
  Administered 2023-09-06: 10 mL via INTRAVENOUS

## 2023-09-06 NOTE — Telephone Encounter (Signed)
Pharmacy Patient Advocate Encounter  Additional information has been requested from the patient's insurance in order to proceed with the prior authorization request. Requested information has been sent, or form has been filled out and faxed back to [phone]

## 2023-09-06 NOTE — Telephone Encounter (Signed)
Pharmacy Patient Advocate Encounter  Received notification from [facility] that Prior Authorization for Dexcom G7 Sensor has been APPROVED from 09-06-2023 to 09-05-2024   PA #/Case ID/Reference #: [id number]

## 2023-09-19 ENCOUNTER — Encounter: Payer: Self-pay | Admitting: Obstetrics & Gynecology

## 2023-09-19 ENCOUNTER — Other Ambulatory Visit (HOSPITAL_COMMUNITY)
Admission: RE | Admit: 2023-09-19 | Discharge: 2023-09-19 | Disposition: A | Discharge status: Home / Self Care | Payer: Medicaid Other | Source: Ambulatory Visit | Attending: Obstetrics & Gynecology | Admitting: Obstetrics & Gynecology

## 2023-09-19 ENCOUNTER — Ambulatory Visit: Payer: Medicaid Other | Admitting: Obstetrics & Gynecology

## 2023-09-19 VITALS — BP 141/95 | HR 105

## 2023-09-19 DIAGNOSIS — Z01419 Encounter for gynecological examination (general) (routine) without abnormal findings: Secondary | ICD-10-CM | POA: Insufficient documentation

## 2023-09-19 DIAGNOSIS — N838 Other noninflammatory disorders of ovary, fallopian tube and broad ligament: Secondary | ICD-10-CM

## 2023-09-19 DIAGNOSIS — L732 Hidradenitis suppurativa: Secondary | ICD-10-CM

## 2023-09-19 DIAGNOSIS — N920 Excessive and frequent menstruation with regular cycle: Secondary | ICD-10-CM | POA: Diagnosis not present

## 2023-09-19 MED ORDER — SILVER SULFADIAZINE 1 % EX CREA: TOPICAL_CREAM | CUTANEOUS | 11 refills | Status: DC

## 2023-09-19 MED ORDER — CLINDAMYCIN PHOSPHATE 1 % EX GEL: Freq: Two times a day (BID) | CUTANEOUS | 11 refills | Status: AC

## 2023-09-19 NOTE — Progress Notes (Signed)
Subjective:     Judy Nelson is a 37 y.o. female here for a routine exam.  No LMP recorded. G1P1001 Birth Control Method:  rhythm Menstrual Calendar(currently): regular heavy 5 days, clots, overnights, soils  Current complaints: HS.   Current acute medical issues:     Recent Gynecologic History No LMP recorded. Last Pap: 12/2021,  normal Last mammogram: na,    Past Medical History:  Diagnosis Date   Anemia    Anxiety    Asthma    CSF leak    Depression    GERD (gastroesophageal reflux disease)    Heartburn 09/01/2015   no current med.   History of anemia 01/2014   Non-insulin dependent type 2 diabetes mellitus (HCC)    Obesity    PTSD (post-traumatic stress disorder)    Scar of breast 08/2015   left   Sinus headache     Past Surgical History:  Procedure Laterality Date   BREAST SURGERY Bilateral    bilateral breast surgery to remove cyst [facility] 2017   CRANIOTOMY N/A 06/30/2017   Procedure: BIFRONTAL CRANIOTOMY;  Surgeon: Lisbeth Renshaw, MD;  Location: [facility];  Service: Neurosurgery;  Laterality: N/A;  BIFRONTAL CRANIOTOMY Repair of Encephalocele   GANGLION CYST EXCISION Left 02/07/2023   Procedure: REMOVAL GANGLION OF WRIST;  Surgeon: Vickki Hearing, MD;  Location: [facility];  Service: Orthopedics;  Laterality: Left;  LMA   INCISION AND DRAINAGE ABSCESS Left 10/07/2014   Procedure: INCISION AND DRAINAGE LEFT BREAST ABSCESS;  Surgeon: Dalia Heading, MD;  Location: [facility];  Service: General;  Laterality: Left;   LAPAROSCOPIC OVARIAN CYSTECTOMY Right 02/05/2014   Procedure: LAPAROSCOPIC right retroperitoneal (NOT OVARIAN) CYSTECTOMY;  Surgeon: Lazaro Arms, MD;  Location: [facility];  Service: Gynecology;  Laterality: Right;   MASS EXCISION Left 09/07/2015   Procedure: Minor SCAR EXCISION LEFT BREAST, PLASTIC CLOSURE;  Surgeon: Louisa Second, MD;  Location: [facility];  Service: Plastics;  Laterality: Left;   TYMPANOSTOMY TUBE PLACEMENT Bilateral  10/17/2014    OB History     Gravida  1   Para  1   Term  1   Preterm      AB      Living  1      SAB      IAB      Ectopic      Multiple      Live Births  1           Social History   Socioeconomic History   Marital status: Single    Spouse name: Not on file   Number of children: Not on file   Years of education: Not on file   Highest education level: Not on file  Occupational History   Not on file  Tobacco Use   Smoking status: Every Day    Current packs/day: 0.50    Average packs/day: 0.5 packs/day for 7.0 years (3.5 ttl pk-yrs)    Types: Cigarettes   Smokeless tobacco: Never  Vaping Use   Vaping status: Never Used  Substance and Sexual Activity   Alcohol use: Yes    Comment: occasionally   Drug use: Yes    Types: Marijuana    Comment: thc gummies   Sexual activity: Yes    Birth control/protection: None  Other Topics Concern   Not on file  Social History Narrative   Not on file   Social Determinants of Health   Financial Resource Strain: Not on  file  Food Insecurity: Not on file  Transportation Needs: Not on file  Physical Activity: Not on file  Stress: Not on file  Social Connections: Not on file    Family History  Problem Relation Age of Onset   Diabetes Father    Hypertension Father    Heart disease Daughter        heart murmur   Cancer Maternal Grandmother        ovarian cancer   Obesity Paternal Grandmother    Diabetes Paternal Grandfather      Current Outpatient Medications:    acetaminophen (TYLENOL) 500 MG tablet, Take 1 tablet (500 mg total) by mouth every 6 (six) hours as needed., Disp: 100 tablet, Rfl: 2   albuterol (PROVENTIL HFA;VENTOLIN HFA) 108 (90 Base) MCG/ACT inhaler, Inhale 2 puffs into the lungs every 6 (six) hours as needed for wheezing or shortness of breath., Disp: , Rfl:    ALPRAZolam (XANAX) 1 MG tablet, TAKE 1 TABLET BY MOUTH THREE TIMES DAILY, Disp: 90 tablet, Rfl: 5    amphetamine-dextroamphetamine (ADDERALL) 30 MG tablet, Take 15 mg by mouth daily as needed (focusing)., Disp: , Rfl:    Bioflavonoid Products (VITAMIN C) CHEW, Chew 2 tablets by mouth daily., Disp: , Rfl:    clindamycin (CLINDAGEL) 1 % gel, Apply topically 2 (two) times daily., Disp: 30 g, Rfl: 11   Continuous Glucose Receiver (DEXCOM G7 RECEIVER) DEVI, Use to monitor BG continuously, Disp: 1 each, Rfl: 0   Continuous Glucose Sensor (DEXCOM G7 SENSOR) MISC, Change sensor every 10 days, Disp: 3 each, Rfl: 2   dapagliflozin propanediol (FARXIGA) 5 MG TABS tablet, Take 1 tablet (5 mg total) by mouth daily before breakfast., Disp: 90 tablet, Rfl: 1   DULoxetine (CYMBALTA) 60 MG capsule, Take 60 mg by mouth 2 (two) times daily., Disp: , Rfl:    ELDERBERRY PO, Take 2 tablets by mouth daily., Disp: , Rfl:    Emollient (GOLD BOND DIABETICS DRY SKIN) CREA, Apply 1 Application topically daily as needed (pain). Uses with aspercreme, Disp: , Rfl:    gabapentin (NEURONTIN) 800 MG tablet, Take 800 mg by mouth 2 (two) times daily., Disp: , Rfl:    glipiZIDE (GLUCOTROL XL) 5 MG 24 hr tablet, Take 1 tablet (5 mg total) by mouth daily with breakfast., Disp: 90 tablet, Rfl: 1   hydrOXYzine (ATARAX) 50 MG tablet, Take 100 mg by mouth at bedtime., Disp: , Rfl:    ibuprofen (ADVIL) 800 MG tablet, Take 1 tablet (800 mg total) by mouth every 8 (eight) hours as needed., Disp: 90 tablet, Rfl: 1   insulin aspart (NOVOLOG FLEXPEN) 100 UNIT/ML FlexPen, Inject 10-16 Units into the skin 3 (three) times daily before meals., Disp: 30 mL, Rfl: 1   insulin glargine (LANTUS) 100 UNIT/ML injection, Inject 70 Units into the skin at bedtime., Disp: , Rfl:    OVER THE COUNTER MEDICATION, Take 1 Dose by mouth daily. [place], Disp: , Rfl:    oxyCODONE-acetaminophen (PERCOCET) 10-325 MG tablet, Take 1 tablet by mouth every 4 (four) hours as needed for pain., Disp: , Rfl:    Phenylephrine-Acetaminophen (TYLENOL SINUS+HEADACHE PO), Take 2  tablets by mouth 2 (two) times daily as needed (sinus pain)., Disp: , Rfl:    rosuvastatin (CRESTOR) 10 MG tablet, Take 1 tablet (10 mg total) by mouth daily., Disp: 90 tablet, Rfl: 1   Semaglutide, 2 MG/DOSE, 8 MG/3ML SOPN, Inject 2 mg as directed once a week., Disp: 3 mL, Rfl: 2  silver sulfADIAZINE (SILVADENE) 1 % cream, Use to area 2-3 times daily, Disp: 50 g, Rfl: 11   silver sulfADIAZINE (SILVADENE) 1 % cream, Apply as directed, Disp: 50 g, Rfl: 11   solifenacin (VESICARE) 10 MG tablet, Take 1 tablet (10 mg total) by mouth daily., Disp: 90 tablet, Rfl: 3   trolamine salicylate (ASPERCREME) 10 % cream, Apply 1 Application topically as needed for muscle pain., Disp: , Rfl:   Review of Systems  Review of Systems  Constitutional: Negative for fever, chills, weight loss, malaise/fatigue and diaphoresis.  HENT: Negative for hearing loss, ear pain, nosebleeds, congestion, sore throat, neck pain, tinnitus and ear discharge.   Eyes: Negative for blurred vision, double vision, photophobia, pain, discharge and redness.  Respiratory: Negative for cough, hemoptysis, sputum production, shortness of breath, wheezing and stridor.   Cardiovascular: Negative for chest pain, palpitations, orthopnea, claudication, leg swelling and PND.  Gastrointestinal: negative for abdominal pain. Negative for heartburn, nausea, vomiting, diarrhea, constipation, blood in stool and melena.  Genitourinary: Negative for dysuria, urgency, frequency, hematuria and flank pain.  Musculoskeletal: Negative for myalgias, back pain, joint pain and falls.  Skin: Negative for itching and rash.  Neurological: Negative for dizziness, tingling, tremors, sensory change, speech change, focal weakness, seizures, loss of consciousness, weakness and headaches.  Endo/Heme/Allergies: Negative for environmental allergies and polydipsia. Does not bruise/bleed easily.  Psychiatric/Behavioral: Negative for depression, suicidal ideas,  hallucinations, memory loss and substance abuse. The patient is not nervous/anxious and does not have insomnia.        Objective:  Blood pressure (!) 141/95, pulse (!) 105.   Physical Exam  Vitals reviewed. Constitutional: She is oriented to person, place, and time. She appears well-developed and well-nourished.  HENT:  Head: Normocephalic and atraumatic.        Right Ear: External ear normal.  Left Ear: External ear normal.  Nose: Nose normal.  Mouth/Throat: Oropharynx is clear and moist.  Eyes: Conjunctivae and EOM are normal. Pupils are equal, round, and reactive to light. Right eye exhibits no discharge. Left eye exhibits no discharge. No scleral icterus.  Neck: Normal range of motion. Neck supple. No tracheal deviation present. No thyromegaly present.  Cardiovascular: Normal rate, regular rhythm, normal heart sounds and intact distal pulses.  Exam reveals no gallop and no friction rub.   No murmur heard. Respiratory: Effort normal and breath sounds normal. No respiratory distress. She has no wheezes. She has no rales. She exhibits no tenderness.  GI: Soft. Bowel sounds are normal. She exhibits no distension and no mass. There is no tenderness. There is no rebound and no guarding.  Genitourinary:  Breasts no masses skin changes or nipple changes bilaterally      Vulva isHurley stage 2 lesions  Vagina is pink moist without discharge Cervix normal in appearance and pap is done Uterus is normal size shape and contour Adnexa is negative with normal sized ovaries   Musculoskeletal: Normal range of motion. She exhibits no edema and no tenderness.  Neurological: She is alert and oriented to person, place, and time. She has normal reflexes. She displays normal reflexes. No cranial nerve deficit. She exhibits normal muscle tone. Coordination normal.  Skin: Skin is warm and dry. No rash noted. No erythema. No pallor.  Psychiatric: She has a normal mood and affect. Her behavior is normal.  Judgment and thought content normal.       Medications Ordered at today's visit: Meds ordered this encounter  Medications   clindamycin (CLINDAGEL) 1 % gel  Sig: Apply topically 2 (two) times daily.    Dispense:  30 g    Refill:  11   silver sulfADIAZINE (SILVADENE) 1 % cream    Sig: Apply as directed    Dispense:  50 g    Refill:  11    Other orders placed at today's visit: No orders of the defined types were placed in this encounter.    ASSESSMENT + PLAN:    ICD-10-CM   1. Well woman exam with routine gynecological exam  Z01.419 Cytology - PAP( [facility])    2. Menorrhagia with regular cycle  N92.0     3. Paratubal cyst  N83.8     4. Vulval hidradenitis suppurativa  L73.2    Needs biologic therapy eventually, will Rx silvadene cream and clindamycin          No follow-ups on file.

## 2023-09-22 LAB — CYTOLOGY - PAP
Chlamydia: NEGATIVE
Comment: NEGATIVE
Comment: NEGATIVE
Comment: NORMAL
Diagnosis: NEGATIVE
Diagnosis: REACTIVE
High risk HPV: NEGATIVE
Neisseria Gonorrhea: NEGATIVE

## 2023-10-25 ENCOUNTER — Ambulatory Visit: Payer: Medicaid Other | Admitting: "Endocrinology

## 2023-10-25 ENCOUNTER — Encounter: Payer: Self-pay | Admitting: "Endocrinology

## 2023-10-25 VITALS — BP 130/74 | HR 84 | Ht 67.5 in | Wt 310.0 lb

## 2023-10-25 DIAGNOSIS — Z794 Long term (current) use of insulin: Secondary | ICD-10-CM

## 2023-10-25 DIAGNOSIS — E1165 Type 2 diabetes mellitus with hyperglycemia: Secondary | ICD-10-CM | POA: Diagnosis not present

## 2023-10-25 DIAGNOSIS — E782 Mixed hyperlipidemia: Secondary | ICD-10-CM | POA: Diagnosis not present

## 2023-10-25 LAB — POCT GLYCOSYLATED HEMOGLOBIN (HGB A1C): HbA1c, POC (controlled diabetic range): 8.1 % — AB (ref 0.0–7.0)

## 2023-10-25 LAB — COMPREHENSIVE METABOLIC PANEL
ALT: 8 [IU]/L (ref 0–32)
AST: 12 [IU]/L (ref 0–40)
Albumin: 3.8 g/dL — ABNORMAL LOW (ref 3.9–4.9)
Alkaline Phosphatase: 138 [IU]/L — ABNORMAL HIGH (ref 44–121)
BUN/Creatinine Ratio: 6 — ABNORMAL LOW (ref 9–23)
BUN: 5 mg/dL — ABNORMAL LOW (ref 6–20)
Bilirubin Total: 0.2 mg/dL (ref 0.0–1.2)
CO2: 26 mmol/L (ref 20–29)
Calcium: 9.3 mg/dL (ref 8.7–10.2)
Chloride: 103 mmol/L (ref 96–106)
Creatinine, Ser: 0.78 mg/dL (ref 0.57–1.00)
Globulin, Total: 3 g/dL (ref 1.5–4.5)
Glucose: 130 mg/dL — ABNORMAL HIGH (ref 70–99)
Potassium: 4.2 mmol/L (ref 3.5–5.2)
Sodium: 141 mmol/L (ref 134–144)
Total Protein: 6.8 g/dL (ref 6.0–8.5)
eGFR: 100 mL/min/{1.73_m2} (ref >59)

## 2023-10-25 LAB — LIPID PANEL
Chol/HDL Ratio: 3.8 {ratio} (ref 0.0–4.4)
Cholesterol, Total: 167 mg/dL (ref 100–199)
HDL: 44 mg/dL (ref >39)
LDL Chol Calc (NIH): 99 mg/dL (ref 0–99)
Triglycerides: 135 mg/dL (ref 0–149)
VLDL Cholesterol Cal: 24 mg/dL (ref 5–40)

## 2023-10-25 MED ORDER — DAPAGLIFLOZIN PROPANEDIOL 10 MG PO TABS: 10.0000 mg | ORAL_TABLET | Freq: Every day | ORAL | 1 refills | Status: DC

## 2023-10-25 MED ORDER — SEMAGLUTIDE (1 MG/DOSE) 4 MG/3ML ~~LOC~~ SOPN: 1.0000 mg | PEN_INJECTOR | SUBCUTANEOUS | 2 refills | Status: DC

## 2023-10-25 NOTE — Patient Instructions (Signed)

## 2023-10-25 NOTE — Progress Notes (Signed)
 10/25/2023, 3:49 PM  Endocrinology follow-up note   Subjective:    Patient ID: Judy Nelson, female    DOB: Nov 03, 1985.  Judy Nelson is being seen in follow-up after she was seen in consultation for management of currently uncontrolled symptomatic diabetes requested by  Dow Longs, PA-C.  Patient was seen in April 2021 in consult for type 2 diabetes.  She did not return for follow-up.  Past Medical History:  Diagnosis Date   Anemia    Anxiety    Asthma    CSF leak    Depression    GERD (gastroesophageal reflux disease)    Heartburn 09/01/2015   no current med.   History of anemia 01/2014   Non-insulin  dependent type 2 diabetes mellitus (HCC)    Obesity    PTSD (post-traumatic stress disorder)    Scar of breast 08/2015   left   Sinus headache     Past Surgical History:  Procedure Laterality Date   BREAST SURGERY Bilateral    bilateral breast surgery to remove cyst at [facility] 2017   CRANIOTOMY N/A 06/30/2017   Procedure: BIFRONTAL CRANIOTOMY;  Surgeon: Lanis Pupa, MD;  Location: [facility] OR;  Service: Neurosurgery;  Laterality: N/A;  BIFRONTAL CRANIOTOMY Repair of Encephalocele   GANGLION CYST EXCISION Left 02/07/2023   Procedure: REMOVAL GANGLION OF WRIST;  Surgeon: Margrette Taft BRAVO, MD;  Location: [facility];  Service: Orthopedics;  Laterality: Left;  LMA   INCISION AND DRAINAGE ABSCESS Left 10/07/2014   Procedure: INCISION AND DRAINAGE LEFT BREAST ABSCESS;  Surgeon: Oneil DELENA Budge, MD;  Location: [facility];  Service: General;  Laterality: Left;   LAPAROSCOPIC OVARIAN CYSTECTOMY Right 02/05/2014   Procedure: LAPAROSCOPIC right retroperitoneal (NOT OVARIAN) CYSTECTOMY;  Surgeon: Vonn VEAR Inch, MD;  Location: [facility];  Service: Gynecology;  Laterality: Right;   MASS EXCISION Left 09/07/2015   Procedure: Minor SCAR EXCISION LEFT BREAST, PLASTIC CLOSURE;  Surgeon: Elna Pick, MD;  Location: [facility] [facility];  Service: Plastics;  Laterality: Left;   TYMPANOSTOMY TUBE PLACEMENT Bilateral 10/17/2014    Social History   Socioeconomic History   Marital status: Single    Spouse name: Not on file   Number of children: Not on file   Years of education: Not on file   Highest education level: Not on file  Occupational History   Not on file  Tobacco Use   Smoking status: Every Day    Current packs/day: 0.50    Average packs/day: 0.5 packs/day for 7.0 years (3.5 ttl pk-yrs)    Types: Cigarettes   Smokeless tobacco: Never  Vaping Use   Vaping status: Never Used  Substance and Sexual Activity   Alcohol use: Yes    Comment: occasionally   Drug use: Yes    Types: Marijuana    Comment: thc gummies   Sexual activity: Yes    Birth control/protection: None  Other Topics Concern   Not on file  Social History Narrative   Not on file   Social Drivers of Health   Financial Resource Strain: Not on file  Food Insecurity: Not on file  Transportation Needs: Not on file  Physical Activity: Not on file  Stress: Not on file  Social Connections: Not  on file    Family History  Problem Relation Age of Onset   Diabetes Father    Hypertension Father    Heart disease Daughter        heart murmur   Cancer Maternal Grandmother        ovarian cancer   Obesity Paternal Grandmother    Diabetes Paternal Grandfather     Outpatient Encounter Medications as of 10/25/2023  Medication Sig   Semaglutide , 1 MG/DOSE, 4 MG/3ML SOPN Inject 1 mg as directed once a week.   acetaminophen  (TYLENOL ) 500 MG tablet Take 1 tablet (500 mg total) by mouth every 6 (six) hours as needed.   albuterol  (PROVENTIL  HFA;VENTOLIN  HFA) 108 (90 Base) MCG/ACT inhaler Inhale 2 puffs into the lungs every 6 (six) hours as needed for wheezing or shortness of breath.   ALPRAZolam  (XANAX ) 1 MG tablet TAKE 1 TABLET BY MOUTH THREE TIMES DAILY   amphetamine -dextroamphetamine  (ADDERALL) 30 MG tablet Take 15 mg by mouth  daily as needed (focusing).   Bioflavonoid Products (VITAMIN C) CHEW Chew 2 tablets by mouth daily.   clindamycin  (CLINDAGEL) 1 % gel Apply topically 2 (two) times daily.   Continuous Glucose Receiver (DEXCOM G7 RECEIVER) DEVI Use to monitor BG continuously   Continuous Glucose Sensor (DEXCOM G7 SENSOR) MISC Change sensor every 10 days   dapagliflozin  propanediol (FARXIGA ) 10 MG TABS tablet Take 1 tablet (10 mg total) by mouth daily before breakfast.   DULoxetine  (CYMBALTA ) 60 MG capsule Take 60 mg by mouth 2 (two) times daily.   ELDERBERRY PO Take 2 tablets by mouth daily.   Emollient (GOLD BOND DIABETICS DRY SKIN) CREA Apply 1 Application topically daily as needed (pain). Uses with aspercreme   gabapentin  (NEURONTIN ) 800 MG tablet Take 800 mg by mouth 2 (two) times daily.   hydrOXYzine  (ATARAX ) 50 MG tablet Take 100 mg by mouth at bedtime.   ibuprofen  (ADVIL ) 800 MG tablet Take 1 tablet (800 mg total) by mouth every 8 (eight) hours as needed.   insulin  aspart (NOVOLOG  FLEXPEN) 100 UNIT/ML FlexPen Inject 10-16 Units into the skin 3 (three) times daily before meals.   insulin  glargine (LANTUS ) 100 UNIT/ML injection Inject 80 Units into the skin at bedtime.   OVER THE COUNTER MEDICATION Take 1 Dose by mouth daily. [place]   oxyCODONE -acetaminophen  (PERCOCET) 10-325 MG tablet Take 1 tablet by mouth every 4 (four) hours as needed for pain.   Phenylephrine -Acetaminophen  (TYLENOL  SINUS+HEADACHE PO) Take 2 tablets by mouth 2 (two) times daily as needed (sinus pain).   rosuvastatin  (CRESTOR ) 10 MG tablet Take 1 tablet (10 mg total) by mouth daily.   silver  sulfADIAZINE  (SILVADENE ) 1 % cream Use to area 2-3 times daily   silver  sulfADIAZINE  (SILVADENE ) 1 % cream Apply as directed   solifenacin  (VESICARE ) 10 MG tablet Take 1 tablet (10 mg total) by mouth daily.   trolamine salicylate (ASPERCREME) 10 % cream Apply 1 Application topically as needed for muscle pain.   [DISCONTINUED] dapagliflozin   propanediol (FARXIGA ) 5 MG TABS tablet Take 1 tablet (5 mg total) by mouth daily before breakfast.   [DISCONTINUED] glipiZIDE  (GLUCOTROL  XL) 5 MG 24 hr tablet Take 1 tablet (5 mg total) by mouth daily with breakfast.   [DISCONTINUED] Semaglutide , 2 MG/DOSE, 8 MG/3ML SOPN Inject 2 mg as directed once a week. (Patient taking differently: Inject 0.5 mg as directed once a week.)   No facility-administered encounter medications on file as of 10/25/2023.    ALLERGIES: Allergies  Allergen Reactions  Penicillin  G Anaphylaxis and Swelling    Throat swelling    VACCINATION STATUS: Immunization History  Administered Date(s) Administered   Influenza,inj,Quad PF,6+ Mos 07/16/2013   Tdap 11/04/2013, 11/16/2021    Diabetes She presents for her follow-up diabetic visit. She has type 2 diabetes mellitus. Onset time: She was diagnosed at approximate age of 25 years. Her disease course has been stable. There are no hypoglycemic associated symptoms. Pertinent negatives for hypoglycemia include no confusion, headaches, pallor or seizures. Associated symptoms include fatigue, polydipsia and polyuria. Pertinent negatives for diabetes include no blurred vision, no chest pain and no polyphagia. There are no hypoglycemic complications. Symptoms are stable. Diabetic complications include peripheral neuropathy. Risk factors for coronary artery disease include dyslipidemia, diabetes mellitus, obesity, sedentary lifestyle, tobacco exposure and family history. Current diabetic treatment includes insulin  injections. Her weight is decreasing steadily. She is following a generally unhealthy diet. When asked about meal planning, she reported none. She has not had a previous visit with a dietitian. She never participates in exercise. Her home blood glucose trend is decreasing steadily. Her breakfast blood glucose range is generally 140-180 mg/dl. Her lunch blood glucose range is generally 140-180 mg/dl. Her dinner blood glucose  range is generally 140-180 mg/dl. Her bedtime blood glucose range is generally 140-180 mg/dl. Her overall blood glucose range is 140-180 mg/dl. (Ms. Verdell presents with her Dexcom device with significant improvement in her glycemic profile.  Her Dexcom shows 60% time in range, 37% level 1 hyperglycemia, 3% level 2 hyperglycemia.  She has no hypoglycemia.  Her point-of-care A1c is 8.1%, unchanged from her last visit A1c , overall progressively improving from 10.7%.    ) An ACE inhibitor/angiotensin II receptor blocker is not being taken.  Hyperlipidemia This is a chronic problem. The current episode started more than 1 year ago. The problem is uncontrolled. Exacerbating diseases include diabetes. Factors aggravating her hyperlipidemia include smoking. Pertinent negatives include no chest pain, myalgias or shortness of breath. She is currently on no antihyperlipidemic treatment. Risk factors for coronary artery disease include diabetes mellitus, dyslipidemia, family history, obesity, a sedentary lifestyle and hypertension.  Hypertension This is a chronic problem. The problem is controlled. Pertinent negatives include no blurred vision, chest pain, headaches, palpitations or shortness of breath. Risk factors for coronary artery disease include dyslipidemia, diabetes mellitus, smoking/tobacco exposure, sedentary lifestyle and family history. Past treatments include calcium  channel blockers.       Objective:       10/25/2023    3:02 PM 09/19/2023    3:36 PM 06/22/2023    3:12 PM  Vitals with BMI  Height 5' 7.5    Weight 310 lbs    BMI 47.81    Systolic 130 141 863  Diastolic 74 95 82  Pulse 84 105     BP 130/74   Pulse 84   Ht 5' 7.5 (1.715 m)   Wt (!) 310 lb (140.6 kg)   BMI 47.84 kg/m   Wt Readings from Last 3 Encounters:  10/25/23 (!) 310 lb (140.6 kg)  06/22/23 (!) 324 lb 3.2 oz (147.1 kg)  04/29/23 (!) 340 lb (154.2 kg)       CMP ( most recent) CMP     Component Value  Date/Time   NA 141 10/24/2023 1353   K 4.2 10/24/2023 1353   CL 103 10/24/2023 1353   CO2 26 10/24/2023 1353   GLUCOSE 130 (H) 10/24/2023 1353   GLUCOSE 228 (H) 02/03/2023 1359   BUN 5 (L) 10/24/2023  1353   CREATININE 0.78 10/24/2023 1353   CREATININE 0.44 (L) 10/22/2013 1247   CALCIUM  9.3 10/24/2023 1353   PROT 6.8 10/24/2023 1353   ALBUMIN  3.8 (L) 10/24/2023 1353   AST 12 10/24/2023 1353   ALT 8 10/24/2023 1353   ALKPHOS 138 (H) 10/24/2023 1353   BILITOT 0.2 10/24/2023 1353   GFRNONAA >60 02/03/2023 1359   GFRAA >60 03/12/2018 1315     Diabetic Labs (most recent): Lab Results  Component Value Date   HGBA1C 8.1 (A) 10/25/2023   HGBA1C 8.1 (A) 06/22/2023   HGBA1C 9.3 (A) 03/16/2023     Lipid Panel ( most recent) Lipid Panel     Component Value Date/Time   CHOL 167 10/24/2023 1353   TRIG 135 10/24/2023 1353   HDL 44 10/24/2023 1353   CHOLHDL 3.8 10/24/2023 1353   LDLCALC 99 10/24/2023 1353   LABVLDL 24 10/24/2023 1353     Lab Results  Component Value Date   TSH 0.928 03/14/2023   TSH 0.910 08/10/2022   TSH 1.41 02/09/2022   TSH 1.580 07/20/2015   TSH 1.250 10/05/2014   FREET4 1.10 03/14/2023   FREET4 1.00 08/10/2022      Assessment & Plan:   1. Uncontrolled type 2 diabetes mellitus with hyperglycemia (HCC)  - Kaysen Sefcik has currently uncontrolled symptomatic type 2 DM since  38 years of age.  Ms. Smarr presents with her Dexcom device with significant improvement in her glycemic profile.  Her Dexcom shows 60% time in range, 37% level 1 hyperglycemia, 3% level 2 hyperglycemia.  She has no hypoglycemia.  Her point-of-care A1c is 8.1%, unchanged from her last visit A1c , overall progressively improving from 10.7%.   Recent labs reviewed. -She presents with persistent hyperglycemia. -her diabetes is complicated by peripheral neuropathy, obesity/sedentary life, smoking and she remains at a high risk for more acute and chronic complications which include  CAD, CVA, CKD, retinopathy, and neuropathy. These are all discussed in detail with her.  In light of her comorbid conditions of obesity, type 2 diabetes, hyperlipidemia, hypertension, she is a candidate for lifestyle medicine.  -She did not engage optimally to lifestyle medicine.  - she acknowledges that there is a room for improvement in her food and drink choices. - Suggestion is made for her to avoid simple carbohydrates  from her diet including Cakes, Sweet Desserts, Ice Cream, Soda (diet and regular), Sweet Tea, Candies, Chips, Cookies, Store Bought Juices, Alcohol , Artificial Sweeteners,  Coffee Creamer, and Sugar-free Products, Lemonade. This will help patient to have more stable blood glucose profile and potentially avoid unintended weight gain.  The following Lifestyle Medicine recommendations according to American College of Lifestyle Medicine  [facility]) were discussed and and offered to patient and she  agrees to start the journey:  A. Whole Foods, Plant-Based Nutrition comprising of fruits and vegetables, plant-based proteins, whole-grain carbohydrates was discussed in detail with the patient.   A list for source of those nutrients were also provided to the patient.  Patient will use only water or unsweetened tea for hydration. B.  The need to stay away from risky substances including alcohol, smoking; obtaining 7 to 9 hours of restorative sleep, at least 150 minutes of moderate intensity exercise weekly, the importance of healthy social connections,  and stress management techniques were discussed. C.  A full color page of  Calorie density of various food groups per pound showing examples of each food groups was provided to the patient.  - she  will be scheduled with Santana Duke, RDN, CDE for diabetes education.  - I have approached her with the following individualized plan to manage  her diabetes and patient agrees:   -Even while she is working on her lifestyle, based on  her  chronic glycemic burden, she will continue to need intensive treatment with basal/bolus insulin   in order for her to achieve control of diabetes to target.    -Evidently, she will benefit from further de-escalation on her medications.  She is advised to discontinue glipizide  at this time.  She is advised to continue Lantus  80 units nightly, continue NovoLog  10 units 3 times daily AC   for Premeal blood glucose readings above 90 mg per DL.  - she is urged to use her CGM continuously, and she is warned not to take insulin  without proper monitoring per orders.  - she is encouraged to call clinic for blood glucose levels less than 70 or above 200 mg /dl.  -She has been benefiting from Ozempic , advised to resume Ozempic  1 mg subcutaneously weekly.   -I discussed and increase her Farxiga  to 10 mg p.o. daily at breakfast.  Side effects and precautions discussed with her.  - Specific targets for  A1c;  LDL, HDL,  and Triglycerides were discussed with the patient.  2) Blood Pressure /Hypertension:  -Her blood pressure is controlled to target.  3) Lipids/Hyperlipidemia:   Review of her recent lipid panel showed significant improvement in her lipid panel including LDL at 99 improving from 154.  Her triglycerides are improving from 365 all the way to 135.   She is not tolerating a whole pill of Crestor , advised to take half of Crestor  10 mg (5 mg) p.o. daily at bedtime.  The above described WF PB diet will address dyslipidemia as well.     4)  Weight/Diet:  Body mass index is 47.84 kg/m.  She has achieved 30+ pounds of weight loss so far. -She presents with steady weight since last visit.  Her BMI is high, clearly complicating her diabetes care.   she is  a candidate for modest weight loss. I discussed with her the fact that loss of 5 - 10% of her  current body weight will have the most impact on her diabetes management.  Exercise, and detailed carbohydrates information provided  -  detailed on discharge  instructions.  5) Chronic Care/Health Maintenance:  -she  is not on ACEI/ARB and Statin medications and  is encouraged to initiate and continue to follow up with Ophthalmology, Dentist,  Podiatrist at least yearly or according to recommendations, and advised to  quit smoking. I have recommended yearly flu vaccine and pneumonia vaccine at least every 5 years; moderate intensity exercise for up to 150 minutes weekly; and  sleep for at least 7 hours a day.  - she is  advised to maintain close follow up with Dow Longs, PA-C for primary care needs, as well as her other providers for optimal and coordinated care.   I spent  40  minutes in the care of the patient today including review of labs from CMP, Lipids, Thyroid Function, Hematology (current and previous including abstractions from other facilities); face-to-face time discussing  her blood glucose readings/logs, discussing hypoglycemia and hyperglycemia episodes and symptoms, medications doses, her options of short and long term treatment based on the latest standards of care / guidelines;  discussion about incorporating lifestyle medicine;  and documenting the encounter. Risk reduction counseling performed per USPSTF guidelines to reduce  obesity and cardiovascular risk factors.     Please refer to Patient Instructions for Blood Glucose Monitoring and Insulin /Medications Dosing Guide  in media tab for additional information. Please  also refer to  Patient Self Inventory in the Media  tab for reviewed elements of pertinent patient history.  [name] participated in the discussions, expressed understanding, and voiced agreement with the above plans.  All questions were answered to her satisfaction. she is encouraged to contact clinic should she have any questions or concerns prior to her return visit.    Follow up plan: - Return in about 4 months (around 02/22/2024) for Bring Meter/CGM Device/Logs- A1c in Office.  Ranny Earl, MD [facility] [address] [place], [place] [address] Phone: [phone]  Fax: [phone]    10/25/2023, 3:49 PM  This note was partially dictated with voice recognition software. Similar sounding words can be transcribed inadequately or may not  be corrected upon review.

## 2023-11-05 ENCOUNTER — Telehealth: Payer: Medicaid Other | Admitting: Physician Assistant

## 2023-11-05 DIAGNOSIS — J0110 Acute frontal sinusitis, unspecified: Secondary | ICD-10-CM

## 2023-11-05 MED ORDER — DOXYCYCLINE HYCLATE 100 MG PO CAPS: 100.0000 mg | ORAL_CAPSULE | Freq: Two times a day (BID) | ORAL | 0 refills | Status: DC

## 2023-11-05 MED ORDER — FLUTICASONE PROPIONATE 50 MCG/ACT NA SUSP: 2.0000 | Freq: Every day | NASAL | 0 refills | Status: DC

## 2023-11-05 NOTE — Patient Instructions (Signed)
Judy Nelson, thank you for joining Roney Jaffe, PA-C for today's virtual visit.  While this provider is not your primary care provider (PCP), if your PCP is located in our provider database this encounter information will be shared with them immediately following your visit.   A [facility] MyChart account gives you access to today's visit and all your visits, tests, and labs performed at [facility] " click here if you don't have a [facility] MyChart account or go to [email or web address]  Consent: (Patient) Judy Nelson provided verbal consent for this virtual visit at the beginning of the encounter.  Current Medications:  Current Outpatient Medications:    doxycycline (VIBRAMYCIN) 100 MG capsule, Take 1 capsule (100 mg total) by mouth 2 (two) times daily., Disp: 20 capsule, Rfl: 0   fluticasone (FLONASE) 50 MCG/ACT nasal spray, Place 2 sprays into both nostrils daily., Disp: 16 g, Rfl: 0   acetaminophen (TYLENOL) 500 MG tablet, Take 1 tablet (500 mg total) by mouth every 6 (six) hours as needed., Disp: 100 tablet, Rfl: 2   albuterol (PROVENTIL HFA;VENTOLIN HFA) 108 (90 Base) MCG/ACT inhaler, Inhale 2 puffs into the lungs every 6 (six) hours as needed for wheezing or shortness of breath., Disp: , Rfl:    ALPRAZolam (XANAX) 1 MG tablet, TAKE 1 TABLET BY MOUTH THREE TIMES DAILY, Disp: 90 tablet, Rfl: 5   amphetamine-dextroamphetamine (ADDERALL) 30 MG tablet, Take 15 mg by mouth daily as needed (focusing)., Disp: , Rfl:    Bioflavonoid Products (VITAMIN C) CHEW, Chew 2 tablets by mouth daily., Disp: , Rfl:    clindamycin (CLINDAGEL) 1 % gel, Apply topically 2 (two) times daily., Disp: 30 g, Rfl: 11   Continuous Glucose Receiver (DEXCOM G7 RECEIVER) DEVI, Use to monitor BG continuously, Disp: 1 each, Rfl: 0   Continuous Glucose Sensor (DEXCOM G7 SENSOR) MISC, Change sensor every 10 days, Disp: 3 each, Rfl: 2   dapagliflozin propanediol (FARXIGA) 10 MG TABS tablet, Take 1  tablet (10 mg total) by mouth daily before breakfast., Disp: 90 tablet, Rfl: 1   DULoxetine (CYMBALTA) 60 MG capsule, Take 60 mg by mouth 2 (two) times daily., Disp: , Rfl:    ELDERBERRY PO, Take 2 tablets by mouth daily., Disp: , Rfl:    Emollient (GOLD BOND DIABETICS DRY SKIN) CREA, Apply 1 Application topically daily as needed (pain). Uses with aspercreme, Disp: , Rfl:    gabapentin (NEURONTIN) 800 MG tablet, Take 800 mg by mouth 2 (two) times daily., Disp: , Rfl:    hydrOXYzine (ATARAX) 50 MG tablet, Take 100 mg by mouth at bedtime., Disp: , Rfl:    ibuprofen (ADVIL) 800 MG tablet, Take 1 tablet (800 mg total) by mouth every 8 (eight) hours as needed., Disp: 90 tablet, Rfl: 1   insulin aspart (NOVOLOG FLEXPEN) 100 UNIT/ML FlexPen, Inject 10-16 Units into the skin 3 (three) times daily before meals., Disp: 30 mL, Rfl: 1   insulin glargine (LANTUS) 100 UNIT/ML injection, Inject 80 Units into the skin at bedtime., Disp: , Rfl:    OVER THE COUNTER MEDICATION, Take 1 Dose by mouth daily. Sea Mosss, Disp: , Rfl:    oxyCODONE-acetaminophen (PERCOCET) 10-325 MG tablet, Take 1 tablet by mouth every 4 (four) hours as needed for pain., Disp: , Rfl:    Phenylephrine-Acetaminophen (TYLENOL SINUS+HEADACHE PO), Take 2 tablets by mouth 2 (two) times daily as needed (sinus pain)., Disp: , Rfl:    rosuvastatin (CRESTOR) 10 MG tablet, Take 1 tablet (10 mg total)  by mouth daily., Disp: 90 tablet, Rfl: 1   Semaglutide, 1 MG/DOSE, 4 MG/3ML SOPN, Inject 1 mg as directed once a week., Disp: 3 mL, Rfl: 2   silver sulfADIAZINE (SILVADENE) 1 % cream, Use to area 2-3 times daily, Disp: 50 g, Rfl: 11   silver sulfADIAZINE (SILVADENE) 1 % cream, Apply as directed, Disp: 50 g, Rfl: 11   solifenacin (VESICARE) 10 MG tablet, Take 1 tablet (10 mg total) by mouth daily., Disp: 90 tablet, Rfl: 3   trolamine salicylate (ASPERCREME) 10 % cream, Apply 1 Application topically as needed for muscle pain., Disp: , Rfl:    Medications  ordered in this encounter:  Meds ordered this encounter  Medications   doxycycline (VIBRAMYCIN) 100 MG capsule    Sig: Take 1 capsule (100 mg total) by mouth 2 (two) times daily.    Dispense:  20 capsule    Refill:  0    Supervising Provider:   Merrilee Jansky [[id number]]   fluticasone (FLONASE) 50 MCG/ACT nasal spray    Sig: Place 2 sprays into both nostrils daily.    Dispense:  16 g    Refill:  0    Supervising Provider:   Merrilee Jansky [[id number]]     *If you need refills on other medications prior to your next appointment, please contact your pharmacy*  Follow-Up: Call back or seek an in-person evaluation if the symptoms worsen or if the condition fails to improve as anticipated.  [facility] [phone]  Other Instructions Sinus Infection, Adult A sinus infection, also called sinusitis, is inflammation of your sinuses. Sinuses are hollow spaces in the bones around your face. Your sinuses are located: Around your eyes. In the middle of your forehead. Behind your nose. In your cheekbones. Mucus normally drains out of your sinuses. When your nasal tissues become inflamed or swollen, mucus can become trapped or blocked. This allows bacteria, viruses, and fungi to grow, which leads to infection. Most infections of the sinuses are caused by a virus. A sinus infection can develop quickly. It can last for up to 4 weeks (acute) or for more than 12 weeks (chronic). A sinus infection often develops after a cold. What are the causes? This condition is caused by anything that creates swelling in the sinuses or stops mucus from draining. This includes: Allergies. Asthma. Infection from bacteria or viruses. Deformities or blockages in your nose or sinuses. Abnormal growths in the nose (nasal polyps). Pollutants, such as chemicals or irritants in the air. Infection from fungi. This is rare. What increases the risk? You are more likely to develop this condition if  you: Have a weak body defense system (immune system). Do a lot of swimming or diving. Overuse nasal sprays. Smoke. What are the signs or symptoms? The main symptoms of this condition are pain and a feeling of pressure around the affected sinuses. Other symptoms include: Stuffy nose or congestion that makes it difficult to breathe through your nose. Thick yellow or greenish drainage from your nose. Tenderness, swelling, and warmth over the affected sinuses. A cough that may get worse at night. Decreased sense of smell and taste. Extra mucus that collects in the throat or the back of the nose (postnasal drip) causing a sore throat or bad breath. Tiredness (fatigue). Fever. How is this diagnosed? This condition is diagnosed based on: Your symptoms. Your medical history. A physical exam. Tests to find out if your condition is acute or chronic. This may include: Checking  your nose for nasal polyps. Viewing your sinuses using a device that has a light (endoscope). Testing for allergies or bacteria. Imaging tests, such as an MRI or CT scan. In rare cases, a bone biopsy may be done to rule out more serious types of fungal sinus disease. How is this treated? Treatment for a sinus infection depends on the cause and whether your condition is chronic or acute. If caused by a virus, your symptoms should go away on their own within 10 days. You may be given medicines to relieve symptoms. They include: Medicines that shrink swollen nasal passages (decongestants). A spray that eases inflammation of the nostrils (topical intranasal corticosteroids). Rinses that help get rid of thick mucus in your nose (nasal saline washes). Medicines that treat allergies (antihistamines). Over-the-counter pain relievers. If caused by bacteria, your health care provider may recommend waiting to see if your symptoms improve. Most bacterial infections will get better without antibiotic medicine. You may be given  antibiotics if you have: A severe infection. A weak immune system. If caused by narrow nasal passages or nasal polyps, surgery may be needed. Follow these instructions at home: Medicines Take, use, or apply over-the-counter and prescription medicines only as told by your health care provider. These may include nasal sprays. If you were prescribed an antibiotic medicine, take it as told by your health care provider. Do not stop taking the antibiotic even if you start to feel better. Hydrate and humidify  Drink enough fluid to keep your urine pale yellow. Staying hydrated will help to thin your mucus. Use a cool mist humidifier to keep the humidity level in your home above 50%. Inhale steam for 10-15 minutes, 3-4 times a day, or as told by your health care provider. You can do this in the bathroom while a hot shower is running. Limit your exposure to cool or dry air. Rest Rest as much as possible. Sleep with your head raised (elevated). Make sure you get enough sleep each night. General instructions  Apply a warm, moist washcloth to your face 3-4 times a day or as told by your health care provider. This will help with discomfort. Use nasal saline washes as often as told by your health care provider. Wash your hands often with soap and water to reduce your exposure to germs. If soap and water are not available, use hand sanitizer. Do not smoke. Avoid being around people who are smoking (secondhand smoke). Keep all follow-up visits. This is important. Contact a health care provider if: You have a fever. Your symptoms get worse. Your symptoms do not improve within 10 days. Get help right away if: You have a severe headache. You have persistent vomiting. You have severe pain or swelling around your face or eyes. You have vision problems. You develop confusion. Your neck is stiff. You have trouble breathing. These symptoms may be an emergency. Get help right away. Call 911. Do not wait  to see if the symptoms will go away. Do not drive yourself to the hospital. Summary A sinus infection is soreness and inflammation of your sinuses. Sinuses are hollow spaces in the bones around your face. This condition is caused by nasal tissues that become inflamed or swollen. The swelling traps or blocks the flow of mucus. This allows bacteria, viruses, and fungi to grow, which leads to infection. If you were prescribed an antibiotic medicine, take it as told by your health care provider. Do not stop taking the antibiotic even if you start to feel  better. Keep all follow-up visits. This is important. This information is not intended to replace advice given to you by your health care provider. Make sure you discuss any questions you have with your health care provider. Document Revised: 09/07/2021 Document Reviewed: 09/07/2021 Elsevier Patient Education  2024 Elsevier Inc.   If you have been instructed to have an in-person evaluation today at a local Urgent Care facility, please use the link below. It will take you to a list of all of our available [facility], including address, phone number and hours of operation. Please do not delay care.  [facility]  If you or a family member do not have a primary care provider, use the link below to schedule a visit and establish care. When you choose a [facility] primary care physician or advanced practice provider, you gain a long-term partner in health. Find a Primary Care Provider  Learn more about [facility]'s in-office and virtual care options: [facility] - Get Care Now

## 2023-11-05 NOTE — Progress Notes (Signed)
Virtual Visit Consent   Judy Nelson, you are scheduled for a virtual visit with a [facility] provider today. Just as with appointments in the office, your consent must be obtained to participate. Your consent will be active for this visit and any virtual visit you may have with one of our providers in the next 365 days. If you have a MyChart account, a copy of this consent can be sent to you electronically.  As this is a virtual visit, video technology does not allow for your provider to perform a traditional examination. This may limit your provider's ability to fully assess your condition. If your provider identifies any concerns that need to be evaluated in person or the need to arrange testing (such as labs, EKG, etc.), we will make arrangements to do so. Although advances in technology are sophisticated, we cannot ensure that it will always work on either your end or our end. If the connection with a video visit is poor, the visit may have to be switched to a telephone visit. With either a video or telephone visit, we are not always able to ensure that we have a secure connection.  By engaging in this virtual visit, you consent to the provision of healthcare and authorize for your insurance to be billed (if applicable) for the services provided during this visit. Depending on your insurance coverage, you may receive a charge related to this service.  I need to obtain your verbal consent now. Are you willing to proceed with your visit today? Judy Nelson has provided verbal consent on 11/05/2023 for a virtual visit (video or telephone). Judy Jaffe, PA-C  Date: 11/05/2023 6:50 PM  Virtual Visit via Video Note   I, Judy Nelson, connected with  Judy Nelson  ([id number], 1986/09/01) on 11/05/23 at  5:30 PM EST by a video-enabled telemedicine application and verified that I am speaking with the correct person using two identifiers.  Location: Patient: Virtual Visit Location Patient:  Home Provider: Virtual Visit Location Provider: Home Office   I discussed the limitations of evaluation and management by telemedicine and the availability of in person appointments. The patient expressed understanding and agreed to proceed.    History of Present Illness: Judy Nelson is a 38 y.o. who identifies as a female who was assigned female at birth, presents with symptoms of upper respiratory tract infection, including sneezing, coughing, and a runny nose. She also reports body aches and a feeling of "aloofness." The patient notes that her symptoms began the previous day, following exposure to her mother and daughter who had been ill for approximately two weeks.  The patient describes a significant sinus pressure, which is causing her eyes to water and contributing to her feeling of aloofness. She has been self-treating with over-the-counter cold and flu medication, as well as carnosine. Despite these efforts, her symptoms have persisted.  In addition to her respiratory symptoms, the patient reports a loss of taste and smell, which has affected her appetite. She has been making an effort to stay hydrated. She also notes that she has been taking half a Percocet, which she is prescribed for post-mastectomy pain, to manage her body aches. However, she expresses concern about the potential for constipation as a side effect of this medication. Problems:  Patient Active Problem List   Diagnosis Date Noted   Insulin long-term use (HCC) 03/16/2023   Ganglion cyst of wrist, left 02/07/2023   Non-adherence to medical treatment 03/01/2022   Bipolar 1 disorder (HCC) 05/06/2020  Uncontrolled type 2 diabetes mellitus with hyperglycemia (HCC) 01/21/2020   Essential hypertension, benign 01/21/2020   Mixed hyperlipidemia 01/21/2020   Pregnancy test positive 11/09/2018   Encounter for IUD removal 11/09/2018   Pelvic pain 11/09/2018   Encounter to determine fetal viability of pregnancy 11/09/2018    CSF leak 06/26/2017   Leukocytosis    Neck pain    PTSD (post-traumatic stress disorder)    Anxiety    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   Tobacco use 06/10/2016   Mastitis 05/24/2016   Left breast abscess 10/05/2014   Type 2 diabetes mellitus without complication (HCC) 10/05/2014   Hypokalemia 10/05/2014   Abscess of breast 10/05/2014   Depression 06/19/2014   Anemia 02/23/2014   Postpartum depression 12/23/2013   Preeclampsia 11/01/2013   Pelvic mass 04/15/2013   Morbid obesity (HCC) 04/15/2013   Morbid obesity with BMI of 50.0-59.9, adult (HCC) 04/15/2013    Allergies:  Allergies  Allergen Reactions   Penicillin G Anaphylaxis and Swelling    Throat swelling   Medications:  Current Outpatient Medications:    doxycycline (VIBRAMYCIN) 100 MG capsule, Take 1 capsule (100 mg total) by mouth 2 (two) times daily., Disp: 20 capsule, Rfl: 0   fluticasone (FLONASE) 50 MCG/ACT nasal spray, Place 2 sprays into both nostrils daily., Disp: 16 g, Rfl: 0   acetaminophen (TYLENOL) 500 MG tablet, Take 1 tablet (500 mg total) by mouth every 6 (six) hours as needed., Disp: 100 tablet, Rfl: 2   albuterol (PROVENTIL HFA;VENTOLIN HFA) 108 (90 Base) MCG/ACT inhaler, Inhale 2 puffs into the lungs every 6 (six) hours as needed for wheezing or shortness of breath., Disp: , Rfl:    ALPRAZolam (XANAX) 1 MG tablet, TAKE 1 TABLET BY MOUTH THREE TIMES DAILY, Disp: 90 tablet, Rfl: 5   amphetamine-dextroamphetamine (ADDERALL) 30 MG tablet, Take 15 mg by mouth daily as needed (focusing)., Disp: , Rfl:    Bioflavonoid Products (VITAMIN C) CHEW, Chew 2 tablets by mouth daily., Disp: , Rfl:    clindamycin (CLINDAGEL) 1 % gel, Apply topically 2 (two) times daily., Disp: 30 g, Rfl: 11   Continuous Glucose Receiver (DEXCOM G7 RECEIVER) DEVI, Use to monitor BG continuously, Disp: 1 each, Rfl: 0   Continuous Glucose Sensor (DEXCOM G7 SENSOR) MISC, Change sensor every 10 days, Disp: 3 each, Rfl: 2   dapagliflozin  propanediol (FARXIGA) 10 MG TABS tablet, Take 1 tablet (10 mg total) by mouth daily before breakfast., Disp: 90 tablet, Rfl: 1   DULoxetine (CYMBALTA) 60 MG capsule, Take 60 mg by mouth 2 (two) times daily., Disp: , Rfl:    ELDERBERRY PO, Take 2 tablets by mouth daily., Disp: , Rfl:    Emollient (GOLD BOND DIABETICS DRY SKIN) CREA, Apply 1 Application topically daily as needed (pain). Uses with aspercreme, Disp: , Rfl:    gabapentin (NEURONTIN) 800 MG tablet, Take 800 mg by mouth 2 (two) times daily., Disp: , Rfl:    hydrOXYzine (ATARAX) 50 MG tablet, Take 100 mg by mouth at bedtime., Disp: , Rfl:    ibuprofen (ADVIL) 800 MG tablet, Take 1 tablet (800 mg total) by mouth every 8 (eight) hours as needed., Disp: 90 tablet, Rfl: 1   insulin aspart (NOVOLOG FLEXPEN) 100 UNIT/ML FlexPen, Inject 10-16 Units into the skin 3 (three) times daily before meals., Disp: 30 mL, Rfl: 1   insulin glargine (LANTUS) 100 UNIT/ML injection, Inject 80 Units into the skin at bedtime., Disp: , Rfl:    OVER THE COUNTER MEDICATION, Take  1 Dose by mouth daily. Sea Mosss, Disp: , Rfl:    oxyCODONE-acetaminophen (PERCOCET) 10-325 MG tablet, Take 1 tablet by mouth every 4 (four) hours as needed for pain., Disp: , Rfl:    Phenylephrine-Acetaminophen (TYLENOL SINUS+HEADACHE PO), Take 2 tablets by mouth 2 (two) times daily as needed (sinus pain)., Disp: , Rfl:    rosuvastatin (CRESTOR) 10 MG tablet, Take 1 tablet (10 mg total) by mouth daily., Disp: 90 tablet, Rfl: 1   Semaglutide, 1 MG/DOSE, 4 MG/3ML SOPN, Inject 1 mg as directed once a week., Disp: 3 mL, Rfl: 2   silver sulfADIAZINE (SILVADENE) 1 % cream, Use to area 2-3 times daily, Disp: 50 g, Rfl: 11   silver sulfADIAZINE (SILVADENE) 1 % cream, Apply as directed, Disp: 50 g, Rfl: 11   solifenacin (VESICARE) 10 MG tablet, Take 1 tablet (10 mg total) by mouth daily., Disp: 90 tablet, Rfl: 3   trolamine salicylate (ASPERCREME) 10 % cream, Apply 1 Application topically as needed  for muscle pain., Disp: , Rfl:   Observations/Objective: Patient is well-developed, well-nourished in no acute distress.  Resting comfortably  at home.  Head is normocephalic, atraumatic.  No labored breathing.  Speech is clear and coherent with logical content.  Patient is alert and oriented at baseline.    Assessment and Plan: 1. Acute non-recurrent frontal sinusitis (Primary) - doxycycline (VIBRAMYCIN) 100 MG capsule; Take 1 capsule (100 mg total) by mouth 2 (two) times daily.  Dispense: 20 capsule; Refill: 0 - fluticasone (FLONASE) 50 MCG/ACT nasal spray; Place 2 sprays into both nostrils daily.  Dispense: 16 g; Refill: 0  Given patient's comorbidities and exposure to illness, current symptoms, reasonable to treat with doxycycline, Flonase.  Patient education given on supportive care.  Follow Up Instructions: I discussed the assessment and treatment plan with the patient. The patient was provided an opportunity to ask questions and all were answered. The patient agreed with the plan and demonstrated an understanding of the instructions.  A copy of instructions were sent to the patient via MyChart unless otherwise noted below.     The patient was advised to call back or seek an in-person evaluation if the symptoms worsen or if the condition fails to improve as anticipated.    Kasandra Knudsen Mayers, PA-C

## 2023-12-07 ENCOUNTER — Other Ambulatory Visit: Payer: Self-pay | Admitting: Physician Assistant

## 2023-12-07 DIAGNOSIS — J0110 Acute frontal sinusitis, unspecified: Secondary | ICD-10-CM

## 2024-01-03 ENCOUNTER — Other Ambulatory Visit: Payer: Self-pay | Admitting: "Endocrinology

## 2024-01-03 ENCOUNTER — Encounter: Payer: Self-pay | Admitting: Obstetrics & Gynecology

## 2024-01-16 ENCOUNTER — Other Ambulatory Visit: Payer: Self-pay | Admitting: "Endocrinology

## 2024-01-16 DIAGNOSIS — E1165 Type 2 diabetes mellitus with hyperglycemia: Secondary | ICD-10-CM

## 2024-01-19 ENCOUNTER — Inpatient Hospital Stay (HOSPITAL_COMMUNITY)
Admission: EM | Admit: 2024-01-19 | Discharge: 2024-01-23 | DRG: 264 | Disposition: A | Discharge status: Home Health | Attending: Surgery | Admitting: Surgery

## 2024-01-19 ENCOUNTER — Encounter (HOSPITAL_COMMUNITY): Payer: Self-pay | Admitting: *Deleted

## 2024-01-19 ENCOUNTER — Other Ambulatory Visit: Payer: Self-pay

## 2024-01-19 ENCOUNTER — Encounter (HOSPITAL_COMMUNITY)
Admission: EM | Disposition: A | Discharge status: Home Health | Payer: Self-pay | Source: Home / Self Care | Attending: Surgery

## 2024-01-19 ENCOUNTER — Inpatient Hospital Stay (HOSPITAL_COMMUNITY): Admitting: Registered Nurse

## 2024-01-19 ENCOUNTER — Emergency Department (HOSPITAL_COMMUNITY)

## 2024-01-19 DIAGNOSIS — K219 Gastro-esophageal reflux disease without esophagitis: Secondary | ICD-10-CM | POA: Diagnosis present

## 2024-01-19 DIAGNOSIS — Z880 Allergy status to penicillin: Secondary | ICD-10-CM

## 2024-01-19 DIAGNOSIS — E871 Hypo-osmolality and hyponatremia: Secondary | ICD-10-CM | POA: Diagnosis present

## 2024-01-19 DIAGNOSIS — F4310 Post-traumatic stress disorder, unspecified: Secondary | ICD-10-CM | POA: Diagnosis present

## 2024-01-19 DIAGNOSIS — E66813 Obesity, class 3: Secondary | ICD-10-CM | POA: Diagnosis present

## 2024-01-19 DIAGNOSIS — B3731 Acute candidiasis of vulva and vagina: Secondary | ICD-10-CM | POA: Diagnosis present

## 2024-01-19 DIAGNOSIS — F319 Bipolar disorder, unspecified: Secondary | ICD-10-CM | POA: Diagnosis present

## 2024-01-19 DIAGNOSIS — E1152 Type 2 diabetes mellitus with diabetic peripheral angiopathy with gangrene: Secondary | ICD-10-CM | POA: Diagnosis present

## 2024-01-19 DIAGNOSIS — I10 Essential (primary) hypertension: Secondary | ICD-10-CM | POA: Diagnosis present

## 2024-01-19 DIAGNOSIS — E1165 Type 2 diabetes mellitus with hyperglycemia: Secondary | ICD-10-CM | POA: Diagnosis present

## 2024-01-19 DIAGNOSIS — Z8249 Family history of ischemic heart disease and other diseases of the circulatory system: Secondary | ICD-10-CM

## 2024-01-19 DIAGNOSIS — F17210 Nicotine dependence, cigarettes, uncomplicated: Secondary | ICD-10-CM | POA: Diagnosis present

## 2024-01-19 DIAGNOSIS — J45909 Unspecified asthma, uncomplicated: Secondary | ICD-10-CM | POA: Diagnosis present

## 2024-01-19 DIAGNOSIS — Z716 Tobacco abuse counseling: Secondary | ICD-10-CM

## 2024-01-19 DIAGNOSIS — Z794 Long term (current) use of insulin: Secondary | ICD-10-CM

## 2024-01-19 DIAGNOSIS — Z833 Family history of diabetes mellitus: Secondary | ICD-10-CM | POA: Diagnosis not present

## 2024-01-19 DIAGNOSIS — E119 Type 2 diabetes mellitus without complications: Secondary | ICD-10-CM

## 2024-01-19 DIAGNOSIS — Z79899 Other long term (current) drug therapy: Secondary | ICD-10-CM

## 2024-01-19 DIAGNOSIS — Z6841 Body Mass Index (BMI) 40.0 and over, adult: Secondary | ICD-10-CM

## 2024-01-19 DIAGNOSIS — E785 Hyperlipidemia, unspecified: Secondary | ICD-10-CM | POA: Diagnosis present

## 2024-01-19 DIAGNOSIS — E876 Hypokalemia: Secondary | ICD-10-CM | POA: Diagnosis not present

## 2024-01-19 DIAGNOSIS — Z8041 Family history of malignant neoplasm of ovary: Secondary | ICD-10-CM | POA: Diagnosis not present

## 2024-01-19 DIAGNOSIS — F909 Attention-deficit hyperactivity disorder, unspecified type: Secondary | ICD-10-CM | POA: Diagnosis present

## 2024-01-19 DIAGNOSIS — Z87728 Personal history of other specified (corrected) congenital malformations of nervous system and sense organs: Secondary | ICD-10-CM

## 2024-01-19 DIAGNOSIS — L02211 Cutaneous abscess of abdominal wall: Principal | ICD-10-CM | POA: Diagnosis present

## 2024-01-19 DIAGNOSIS — M7989 Other specified soft tissue disorders: Secondary | ICD-10-CM | POA: Diagnosis not present

## 2024-01-19 DIAGNOSIS — F419 Anxiety disorder, unspecified: Secondary | ICD-10-CM | POA: Diagnosis present

## 2024-01-19 DIAGNOSIS — L03311 Cellulitis of abdominal wall: Secondary | ICD-10-CM | POA: Diagnosis present

## 2024-01-19 DIAGNOSIS — R000 Tachycardia, unspecified: Secondary | ICD-10-CM | POA: Diagnosis present

## 2024-01-19 HISTORY — PX: WOUND DEBRIDEMENT: SHX247

## 2024-01-19 HISTORY — PX: INCISION AND DRAINAGE ABSCESS: SHX5864

## 2024-01-19 LAB — CBC WITH DIFFERENTIAL/PLATELET
Abs Immature Granulocytes: 0.52 10*3/uL — ABNORMAL HIGH (ref 0.00–0.07)
Basophils Absolute: 0.1 10*3/uL (ref 0.0–0.1)
Basophils Relative: 0 %
Eosinophils Absolute: 0 10*3/uL (ref 0.0–0.5)
Eosinophils Relative: 0 %
HCT: 39.1 % (ref 36.0–46.0)
Hemoglobin: 13 g/dL (ref 12.0–15.0)
Immature Granulocytes: 2 %
Lymphocytes Relative: 7 %
Lymphs Abs: 2.3 10*3/uL (ref 0.7–4.0)
MCH: 27.3 pg (ref 26.0–34.0)
MCHC: 33.2 g/dL (ref 30.0–36.0)
MCV: 82 fL (ref 80.0–100.0)
Monocytes Absolute: 2.6 10*3/uL — ABNORMAL HIGH (ref 0.1–1.0)
Monocytes Relative: 8 %
Neutro Abs: 26.1 10*3/uL — ABNORMAL HIGH (ref 1.7–7.7)
Neutrophils Relative %: 83 %
Platelets: 294 10*3/uL (ref 150–400)
RBC: 4.77 MIL/uL (ref 3.87–5.11)
RDW: 13.3 % (ref 11.5–15.5)
WBC: 31.6 10*3/uL — ABNORMAL HIGH (ref 4.0–10.5)
nRBC: 0 % (ref 0.0–0.2)

## 2024-01-19 LAB — COMPREHENSIVE METABOLIC PANEL WITH GFR
ALT: 11 U/L (ref 0–44)
AST: 11 U/L — ABNORMAL LOW (ref 15–41)
Albumin: 2.9 g/dL — ABNORMAL LOW (ref 3.5–5.0)
Alkaline Phosphatase: 125 U/L (ref 38–126)
Anion gap: 10 (ref 5–15)
BUN: 6 mg/dL (ref 6–20)
CO2: 25 mmol/L (ref 22–32)
Calcium: 9 mg/dL (ref 8.9–10.3)
Chloride: 97 mmol/L — ABNORMAL LOW (ref 98–111)
Creatinine, Ser: 0.84 mg/dL (ref 0.44–1.00)
GFR, Estimated: >60 mL/min (ref >60)
Glucose, Bld: 102 mg/dL — ABNORMAL HIGH (ref 70–99)
Potassium: 3.3 mmol/L — ABNORMAL LOW (ref 3.5–5.1)
Sodium: 132 mmol/L — ABNORMAL LOW (ref 135–145)
Total Bilirubin: 0.9 mg/dL (ref 0.0–1.2)
Total Protein: 7.9 g/dL (ref 6.5–8.1)

## 2024-01-19 LAB — LACTIC ACID, PLASMA
Lactic Acid, Venous: 1.7 mmol/L (ref 0.5–1.9)
Lactic Acid, Venous: 1.2 mmol/L (ref 0.5–1.9)

## 2024-01-19 LAB — CBG MONITORING, ED
Glucose-Capillary: 62 mg/dL — ABNORMAL LOW (ref 70–99)
Glucose-Capillary: 107 mg/dL — ABNORMAL HIGH (ref 70–99)

## 2024-01-19 LAB — PREGNANCY, URINE: Preg Test, Ur: NEGATIVE

## 2024-01-19 SURGERY — INCISION AND DRAINAGE, ABSCESS
Anesthesia: General | Site: Abdomen

## 2024-01-19 MED ORDER — VANCOMYCIN HCL IN DEXTROSE 1-5 GM/200ML-% IV SOLN
1000.0000 mg | Freq: Once | INTRAVENOUS | Status: DC
  Filled 2024-01-19: qty 200

## 2024-01-19 MED ORDER — HYDROMORPHONE HCL 1 MG/ML IJ SOLN
INTRAMUSCULAR | Status: DC | PRN
  Administered 2024-01-19 (×2): .5 mg via INTRAVENOUS

## 2024-01-19 MED ORDER — DAPAGLIFLOZIN PROPANEDIOL 10 MG PO TABS
10.0000 mg | ORAL_TABLET | Freq: Every day | ORAL | Status: DC
  Administered 2024-01-20: 10 mg via ORAL
  Filled 2024-01-19: qty 1

## 2024-01-19 MED ORDER — SODIUM CHLORIDE 0.9 % IV BOLUS: 1000.0000 mL | Freq: Once | INTRAVENOUS | Status: DC

## 2024-01-19 MED ORDER — GABAPENTIN 400 MG PO CAPS
800.0000 mg | ORAL_CAPSULE | Freq: Once | ORAL | Status: AC
  Administered 2024-01-19: 800 mg via ORAL
  Filled 2024-01-19: qty 2

## 2024-01-19 MED ORDER — LACTATED RINGERS IV SOLN
INTRAVENOUS | Status: DC | PRN
Start: 2024-01-19 — End: 2024-01-19

## 2024-01-19 MED ORDER — SODIUM CHLORIDE 0.9 % IV SOLN: 12.5000 mg | INTRAVENOUS | Status: DC | PRN

## 2024-01-19 MED ORDER — PROPOFOL 10 MG/ML IV BOLUS
INTRAVENOUS | Status: AC
  Filled 2024-01-19: qty 20

## 2024-01-19 MED ORDER — DIPHENHYDRAMINE HCL 25 MG PO CAPS
25.0000 mg | ORAL_CAPSULE | Freq: Once | ORAL | Status: AC
  Administered 2024-01-19: 25 mg via ORAL
  Filled 2024-01-19: qty 1

## 2024-01-19 MED ORDER — VANCOMYCIN HCL 2000 MG/400ML IV SOLN
2000.0000 mg | Freq: Once | INTRAVENOUS | Status: AC
Start: 2024-01-19 — End: 2024-01-19
  Administered 2024-01-19: 2000 mg via INTRAVENOUS
  Filled 2024-01-19: qty 400

## 2024-01-19 MED ORDER — CHLORHEXIDINE GLUCONATE CLOTH 2 % EX PADS
6.0000 | MEDICATED_PAD | Freq: Every day | CUTANEOUS | Status: DC
  Administered 2024-01-19 – 2024-01-23 (×4): 6 via TOPICAL

## 2024-01-19 MED ORDER — ACETAMINOPHEN 500 MG PO TABS
1000.0000 mg | ORAL_TABLET | Freq: Four times a day (QID) | ORAL | Status: DC
  Administered 2024-01-19 – 2024-01-22 (×11): 1000 mg via ORAL
  Administered 2024-01-22: 500 mg via ORAL
  Administered 2024-01-22 – 2024-01-23 (×2): 1000 mg via ORAL
  Filled 2024-01-19 (×15): qty 2

## 2024-01-19 MED ORDER — INSULIN GLARGINE-YFGN 100 UNIT/ML ~~LOC~~ SOLN
40.0000 [IU] | Freq: Every day | SUBCUTANEOUS | Status: DC
  Administered 2024-01-20 – 2024-01-22 (×4): 40 [IU] via SUBCUTANEOUS
  Filled 2024-01-19 (×5): qty 0.4

## 2024-01-19 MED ORDER — IOHEXOL 300 MG/ML  SOLN
100.0000 mL | Freq: Once | INTRAMUSCULAR | Status: AC | PRN
  Administered 2024-01-19: 100 mL via INTRAVENOUS

## 2024-01-19 MED ORDER — MIDAZOLAM HCL 5 MG/5ML IJ SOLN
INTRAMUSCULAR | Status: DC | PRN
  Administered 2024-01-19: 2 mg via INTRAVENOUS

## 2024-01-19 MED ORDER — MIDAZOLAM HCL 2 MG/2ML IJ SOLN
INTRAMUSCULAR | Status: AC
  Filled 2024-01-19: qty 2

## 2024-01-19 MED ORDER — TRICHLOROACETIC ACID 80 % EX LIQD
CUTANEOUS | Status: DC | PRN
Start: 2024-01-19 — End: 2024-01-19
  Administered 2024-01-19: 1 via TOPICAL

## 2024-01-19 MED ORDER — SUGAMMADEX SODIUM 200 MG/2ML IV SOLN
INTRAVENOUS | Status: AC
  Filled 2024-01-19: qty 2

## 2024-01-19 MED ORDER — SODIUM CHLORIDE 0.9 % IV SOLN
1.0000 g | Freq: Three times a day (TID) | INTRAVENOUS | Status: DC
  Administered 2024-01-19 – 2024-01-23 (×11): 1 g via INTRAVENOUS
  Filled 2024-01-19 (×13): qty 20

## 2024-01-19 MED ORDER — LACTATED RINGERS IV SOLN: INTRAVENOUS | Status: DC

## 2024-01-19 MED ORDER — BUPIVACAINE HCL (PF) 0.5 % IJ SOLN
INTRAMUSCULAR | Status: AC
  Filled 2024-01-19: qty 30

## 2024-01-19 MED ORDER — SODIUM CHLORIDE 0.9 % IV SOLN: INTRAVENOUS | Status: DC

## 2024-01-19 MED ORDER — HYDROMORPHONE HCL 1 MG/ML IJ SOLN: 0.2500 mg | INTRAMUSCULAR | Status: DC | PRN

## 2024-01-19 MED ORDER — LIDOCAINE HCL (CARDIAC) PF 100 MG/5ML IV SOSY
PREFILLED_SYRINGE | INTRAVENOUS | Status: DC | PRN
  Administered 2024-01-19: 100 mg via INTRAVENOUS

## 2024-01-19 MED ORDER — INSULIN ASPART 100 UNIT/ML IJ SOLN
0.0000 [IU] | INTRAMUSCULAR | Status: DC
  Administered 2024-01-20: 3 [IU] via SUBCUTANEOUS
  Administered 2024-01-20 (×3): 7 [IU] via SUBCUTANEOUS
  Administered 2024-01-20 (×2): 4 [IU] via SUBCUTANEOUS
  Administered 2024-01-21: 7 [IU] via SUBCUTANEOUS
  Administered 2024-01-21 – 2024-01-22 (×2): 4 [IU] via SUBCUTANEOUS
  Administered 2024-01-22: 3 [IU] via SUBCUTANEOUS
  Administered 2024-01-22: 7 [IU] via SUBCUTANEOUS
  Administered 2024-01-22 – 2024-01-23 (×2): 4 [IU] via SUBCUTANEOUS
  Administered 2024-01-23: 7 [IU] via SUBCUTANEOUS
  Administered 2024-01-23: 4 [IU] via SUBCUTANEOUS

## 2024-01-19 MED ORDER — CLINDAMYCIN PHOSPHATE 900 MG/50ML IV SOLN: 900.0000 mg | Freq: Once | INTRAVENOUS | Status: DC

## 2024-01-19 MED ORDER — ONDANSETRON HCL 4 MG/2ML IJ SOLN
INTRAMUSCULAR | Status: DC | PRN
  Administered 2024-01-19: 4 mg via INTRAVENOUS

## 2024-01-19 MED ORDER — OXYCODONE HCL 5 MG PO TABS
5.0000 mg | ORAL_TABLET | ORAL | Status: DC | PRN
  Administered 2024-01-20 – 2024-01-22 (×6): 5 mg via ORAL
  Filled 2024-01-19 (×6): qty 1

## 2024-01-19 MED ORDER — SODIUM CHLORIDE 0.9 % IR SOLN
Status: DC | PRN
  Administered 2024-01-19: 1000 mL

## 2024-01-19 MED ORDER — ROCURONIUM BROMIDE 10 MG/ML (PF) SYRINGE
PREFILLED_SYRINGE | INTRAVENOUS | Status: AC
  Filled 2024-01-19: qty 10

## 2024-01-19 MED ORDER — ONDANSETRON HCL 4 MG/2ML IJ SOLN: 4.0000 mg | Freq: Four times a day (QID) | INTRAMUSCULAR | Status: DC | PRN

## 2024-01-19 MED ORDER — ONDANSETRON HCL 4 MG/2ML IJ SOLN
INTRAMUSCULAR | Status: AC
  Filled 2024-01-19: qty 2

## 2024-01-19 MED ORDER — ONDANSETRON HCL 4 MG PO TABS: 4.0000 mg | ORAL_TABLET | Freq: Four times a day (QID) | ORAL | Status: DC | PRN

## 2024-01-19 MED ORDER — ROCURONIUM BROMIDE 10 MG/ML (PF) SYRINGE
PREFILLED_SYRINGE | INTRAVENOUS | Status: DC | PRN
  Administered 2024-01-19: 70 mg via INTRAVENOUS

## 2024-01-19 MED ORDER — ONDANSETRON HCL 4 MG/2ML IJ SOLN
4.0000 mg | Freq: Once | INTRAMUSCULAR | Status: AC
  Administered 2024-01-19: 4 mg via INTRAVENOUS
  Filled 2024-01-19: qty 2

## 2024-01-19 MED ORDER — HYDROMORPHONE HCL 1 MG/ML IJ SOLN
1.0000 mg | INTRAMUSCULAR | Status: DC | PRN
  Administered 2024-01-19 – 2024-01-20 (×2): 1 mg via INTRAVENOUS
  Filled 2024-01-19 (×2): qty 1

## 2024-01-19 MED ORDER — LORAZEPAM 2 MG/ML IJ SOLN
1.0000 mg | Freq: Once | INTRAMUSCULAR | Status: AC
  Administered 2024-01-19: 1 mg via INTRAVENOUS
  Filled 2024-01-19: qty 1

## 2024-01-19 MED ORDER — HYDROMORPHONE HCL 1 MG/ML IJ SOLN
0.5000 mg | Freq: Once | INTRAMUSCULAR | Status: AC
  Administered 2024-01-19: 0.5 mg via INTRAVENOUS
  Filled 2024-01-19: qty 0.5

## 2024-01-19 MED ORDER — ROSUVASTATIN CALCIUM 10 MG PO TABS
10.0000 mg | ORAL_TABLET | Freq: Every day | ORAL | Status: DC
  Administered 2024-01-20 – 2024-01-23 (×3): 10 mg via ORAL
  Filled 2024-01-19 (×4): qty 1

## 2024-01-19 MED ORDER — INSULIN GLARGINE-YFGN 100 UNIT/ML ~~LOC~~ SOLN: 40.0000 [IU] | Freq: Every day | SUBCUTANEOUS | Status: DC

## 2024-01-19 MED ORDER — POLYETHYLENE GLYCOL 3350 17 G PO PACK: 17.0000 g | PACK | Freq: Every day | ORAL | Status: DC | PRN

## 2024-01-19 MED ORDER — ALPRAZOLAM 1 MG PO TABS
1.0000 mg | ORAL_TABLET | Freq: Three times a day (TID) | ORAL | Status: DC
  Administered 2024-01-19 – 2024-01-23 (×11): 1 mg via ORAL
  Filled 2024-01-19 (×3): qty 1
  Filled 2024-01-19: qty 2
  Filled 2024-01-19: qty 1
  Filled 2024-01-19 (×2): qty 2
  Filled 2024-01-19 (×2): qty 1
  Filled 2024-01-19 (×2): qty 2

## 2024-01-19 MED ORDER — ONDANSETRON 4 MG PO TBDP: 4.0000 mg | ORAL_TABLET | Freq: Once | ORAL | Status: DC

## 2024-01-19 MED ORDER — PROPOFOL 10 MG/ML IV BOLUS
INTRAVENOUS | Status: DC | PRN
  Administered 2024-01-19: 200 mg via INTRAVENOUS

## 2024-01-19 MED ORDER — DOCUSATE SODIUM 100 MG PO CAPS
100.0000 mg | ORAL_CAPSULE | Freq: Two times a day (BID) | ORAL | Status: DC
  Administered 2024-01-19: 100 mg via ORAL
  Filled 2024-01-19: qty 1

## 2024-01-19 MED ORDER — FENTANYL CITRATE (PF) 100 MCG/2ML IJ SOLN
INTRAMUSCULAR | Status: DC | PRN
  Administered 2024-01-19 (×2): 50 ug via INTRAVENOUS

## 2024-01-19 MED ORDER — LIDOCAINE HCL (PF) 2 % IJ SOLN
INTRAMUSCULAR | Status: AC
  Filled 2024-01-19: qty 15

## 2024-01-19 MED ORDER — FENTANYL CITRATE (PF) 100 MCG/2ML IJ SOLN
INTRAMUSCULAR | Status: AC
  Filled 2024-01-19: qty 2

## 2024-01-19 MED ORDER — HYDROMORPHONE HCL 1 MG/ML IJ SOLN
INTRAMUSCULAR | Status: AC
  Filled 2024-01-19: qty 0.5

## 2024-01-19 MED ORDER — SUGAMMADEX SODIUM 200 MG/2ML IV SOLN
INTRAVENOUS | Status: DC | PRN
  Administered 2024-01-19: 200 mg via INTRAVENOUS

## 2024-01-19 SURGICAL SUPPLY — 28 items
BINDER ABDOMINAL 12 XL 75-84 (SOFTGOODS) IMPLANT
BNDG GAUZE ROLL STR 2.25X3YD (GAUZE/BANDAGES/DRESSINGS) IMPLANT
CHLORAPREP W/TINT 26 (MISCELLANEOUS) IMPLANT
CLOTH BEACON ORANGE TIMEOUT ST (SAFETY) ×2 IMPLANT
COVER LIGHT HANDLE STERIS (MISCELLANEOUS) ×4 IMPLANT
ELECT REM PT RETURN 9FT ADLT (ELECTROSURGICAL) ×2 IMPLANT
ELECTRODE REM PT RTRN 9FT ADLT (ELECTROSURGICAL) ×2 IMPLANT
GAUZE SPONGE 4X4 12PLY STRL (GAUZE/BANDAGES/DRESSINGS) ×4 IMPLANT
GLOVE BIOGEL PI IND STRL 6.5 (GLOVE) ×2 IMPLANT
GLOVE BIOGEL PI IND STRL 7.0 (GLOVE) ×4 IMPLANT
GLOVE SURG SS PI 6.5 STRL IVOR (GLOVE) ×2 IMPLANT
GOWN STRL REUS W/TWL LRG LVL3 (GOWN DISPOSABLE) ×4 IMPLANT
KIT TURNOVER KIT A (KITS) ×2 IMPLANT
MANIFOLD NEPTUNE II (INSTRUMENTS) ×2 IMPLANT
NDL HYPO 21X1.5 SAFETY (NEEDLE) IMPLANT
NEEDLE HYPO 21X1.5 SAFETY (NEEDLE) ×2 IMPLANT
NS IRRIG 1000ML POUR BTL (IV SOLUTION) ×2 IMPLANT
PACK ABDOMINAL MAJOR (CUSTOM PROCEDURE TRAY) IMPLANT
PAD ABD 5X9 TENDERSORB (GAUZE/BANDAGES/DRESSINGS) IMPLANT
PAD ARMBOARD POSITIONER FOAM (MISCELLANEOUS) ×2 IMPLANT
POSITIONER HEAD 8X9X4 ADT (SOFTGOODS) ×2 IMPLANT
SET HNDPC FAN SPRY TIP SCT (DISPOSABLE) IMPLANT
SOL .9 NS 3000ML IRR UROMATIC (IV SOLUTION) IMPLANT
SUT VIC AB 3-0 SH 27X BRD (SUTURE) IMPLANT
SWAB CULTURE LIQ STUART DBL (MISCELLANEOUS) IMPLANT
SYR 10ML LL (SYRINGE) IMPLANT
SYR BULB IRRIG 60ML STRL (SYRINGE) ×2 IMPLANT
TAPE SURG TRANSPORE 1 IN (GAUZE/BANDAGES/DRESSINGS) IMPLANT

## 2024-01-19 NOTE — Transfer of Care (Signed)
 Immediate Anesthesia Transfer of Care Note  Patient: Judy Nelson  Procedure(s) Performed: INCISION AND DRAINAGE ABDOMINAL WALL ABSCESS DEBRIDEMENT OF ABDOMINAL WALL NECROTIZING SOFT TISSUE INFECTION, 25 X 20 X 8 CM  Patient Location: PACU  Anesthesia Type:General  Level of Consciousness: sedated  Airway & Oxygen Therapy: Patient connected to nasal cannula oxygen and Patient connected to face mask oxygen  Post-op Assessment: Post -op Vital signs reviewed and stable  Post vital signs: Reviewed  Last Vitals:  Vitals Value Taken Time  BP 121/59 01/19/24 1930  Temp    Pulse 130 01/19/24 1933  Resp    SpO2 100 % 01/19/24 1933  Vitals shown include unfiled device data.  Last Pain:  Vitals:   01/19/24 1201  TempSrc:   PainSc: 10-Worst pain ever         Complications: No notable events documented.

## 2024-01-19 NOTE — ED Triage Notes (Signed)
 Pt brought in by [facility] from home with c/o possible abscess to abdomen. Pt reports the area is tender, painful to touch, warm, red, and swollen x 1 week.

## 2024-01-19 NOTE — Consult Note (Addendum)
 [facility] Consult  Reason for Consult: Abdominal wall abscess, necrotizing soft tissue infection Referring Physician: Pauline Aus, PA-C  Chief Complaint   Skin Problem     HPI: Judy Nelson is a 38 y.o. female who presents with a 1 week history of abdominal wall swelling, erythema, and pain.  Patient states that she has a history of cysts all over her body, and thought that this area was a cyst.  She tried multiple topical agents to try to get this area to drain, however none of them helped.  She has felt a little chilled in the evening but denies measuring any fevers.  She notes that she has had poor control of her blood sugars recently.  She denies any issues with eating or bowel movements.  Her past medical history significant for hypertension, diabetes, and GERD.  Her surgical history is significant for bilateral breast surgeries for cyst removal, incision and drainage of breast abscesses, and laparoscopic ovarian cystectomy.  In the ED, she was noted to be tachycardic.  She is otherwise hemodynamically stable.  She is has a leukocytosis of 31.6 and she is mildly hyponatremic with a sodium of 132.  Her blood sugar is normal at 102.  She has no lactic acidosis with lactic acid of 1.7 and 1.2.  She underwent a CT of the abdomen and pelvis which is demonstrating skin thickening, subcutaneous edema, and soft tissue gas in the lower anterior abdominal wall, suspicious for gas-forming infection/cellulitis, and a focal 4.6 cm fluid collection in the abdominal wall suspicious for abscess.  Past Medical History:  Diagnosis Date   Anemia    Anxiety    Asthma    CSF leak    Depression    GERD (gastroesophageal reflux disease)    Heartburn 09/01/2015   no current med.   History of anemia 01/2014   Hypertension    Non-insulin dependent type 2 diabetes mellitus (HCC)    Obesity    PTSD (post-traumatic stress disorder)    Scar of breast 08/2015   left   Sinus headache      Past Surgical History:  Procedure Laterality Date   BREAST SURGERY Bilateral    bilateral breast surgery to remove cyst at [facility] 2017   CRANIOTOMY N/A 06/30/2017   Procedure: BIFRONTAL CRANIOTOMY;  Surgeon: Lisbeth Renshaw, MD;  Location: [facility] OR;  Service: Neurosurgery;  Laterality: N/A;  BIFRONTAL CRANIOTOMY Repair of Encephalocele   GANGLION CYST EXCISION Left 02/07/2023   Procedure: REMOVAL GANGLION OF WRIST;  Surgeon: Vickki Hearing, MD;  Location: [facility];  Service: Orthopedics;  Laterality: Left;  LMA   INCISION AND DRAINAGE ABSCESS Left 10/07/2014   Procedure: INCISION AND DRAINAGE LEFT BREAST ABSCESS;  Surgeon: Dalia Heading, MD;  Location: [facility];  Service: General;  Laterality: Left;   LAPAROSCOPIC OVARIAN CYSTECTOMY Right 02/05/2014   Procedure: LAPAROSCOPIC right retroperitoneal (NOT OVARIAN) CYSTECTOMY;  Surgeon: Lazaro Arms, MD;  Location: [facility];  Service: Gynecology;  Laterality: Right;   MASS EXCISION Left 09/07/2015   Procedure: Minor SCAR EXCISION LEFT BREAST, PLASTIC CLOSURE;  Surgeon: Louisa Second, MD;  Location: [facility];  Service: Plastics;  Laterality: Left;   TYMPANOSTOMY TUBE PLACEMENT Bilateral 10/17/2014    Family History  Problem Relation Age of Onset   Diabetes Father    Hypertension Father    Heart disease Daughter        heart murmur   Cancer Maternal Grandmother        ovarian  cancer   Obesity Paternal Grandmother    Diabetes Paternal Grandfather     Social History   Tobacco Use   Smoking status: Every Day    Current packs/day: 0.50    Average packs/day: 0.5 packs/day for 7.0 years (3.5 ttl pk-yrs)    Types: Cigarettes   Smokeless tobacco: Never  Vaping Use   Vaping status: Never Used  Substance Use Topics   Alcohol use: Not Currently   Drug use: Yes    Types: Marijuana    Comment: THC gummies    Medications: I have reviewed the patient's current medications.  Allergies  Allergen Reactions    Penicillin G Anaphylaxis and Swelling    Throat swelling     ROS:  Pertinent items are noted in HPI.  Blood pressure 124/67, pulse (!) 112, temperature 98.5 F (36.9 C), temperature source Oral, resp. rate 19, height 5' 7.5" (1.715 m), weight (!) 137.9 kg, last menstrual period 01/14/2024, SpO2 97%. Physical Exam Vitals reviewed.  Constitutional:      Appearance: Normal appearance. She is obese.  HENT:     Head: Normocephalic and atraumatic.  Eyes:     Extraocular Movements: Extraocular movements intact.     Pupils: Pupils are equal, round, and reactive to light.  Cardiovascular:     Rate and Rhythm: Tachycardia present.  Pulmonary:     Effort: Pulmonary effort is normal.  Abdominal:     Comments: Abdomen soft, nondistended, no percussion tenderness, mild tenderness to palpation to the right of the umbilicus with associated erythema and induration; no rigidity, guarding, or rebound tenderness  Musculoskeletal:        General: Normal range of motion.     Cervical back: Normal range of motion.  Skin:    General: Skin is warm and dry.  Neurological:     General: No focal deficit present.     Mental Status: She is alert and oriented to person, place, and time.  Psychiatric:        Mood and Affect: Mood normal.        Behavior: Behavior normal.     Results: Results for orders placed or performed during the hospital encounter of 01/19/24 (from the past 48 hours)  CBG monitoring, ED     Status: Abnormal   Collection Time: 01/19/24  8:44 AM  Result Value Ref Range   Glucose-Capillary 62 (L) 70 - 99 mg/dL    Comment: Glucose reference range applies only to samples taken after fasting for at least 8 hours.  CBG monitoring, ED     Status: Abnormal   Collection Time: 01/19/24  9:41 AM  Result Value Ref Range   Glucose-Capillary 107 (H) 70 - 99 mg/dL    Comment: Glucose reference range applies only to samples taken after fasting for at least 8 hours.  CBC with Differential      Status: Abnormal   Collection Time: 01/19/24  9:54 AM  Result Value Ref Range   WBC 31.6 (H) 4.0 - 10.5 K/uL   RBC 4.77 3.87 - 5.11 MIL/uL   Hemoglobin 13.0 12.0 - 15.0 g/dL   HCT 40.9 81.1 - 91.4 %   MCV 82.0 80.0 - 100.0 fL   MCH 27.3 26.0 - 34.0 pg   MCHC 33.2 30.0 - 36.0 g/dL   RDW 78.2 95.6 - 21.3 %   Platelets 294 150 - 400 K/uL   nRBC 0.0 0.0 - 0.2 %   Neutrophils Relative % 83 %   Neutro Abs 26.1 (  H) 1.7 - 7.7 K/uL   Lymphocytes Relative 7 %   Lymphs Abs 2.3 0.7 - 4.0 K/uL   Monocytes Relative 8 %   Monocytes Absolute 2.6 (H) 0.1 - 1.0 K/uL   Eosinophils Relative 0 %   Eosinophils Absolute 0.0 0.0 - 0.5 K/uL   Basophils Relative 0 %   Basophils Absolute 0.1 0.0 - 0.1 K/uL   RBC Morphology MORPHOLOGY UNREMARKABLE    Smear Review MORPHOLOGY UNREMARKABLE    Immature Granulocytes 2 %   Abs Immature Granulocytes 0.52 (H) 0.00 - 0.07 K/uL   Smudge Cells PRESENT     Comment: Performed at [facility], [address] [place], [place] [address]  Comprehensive metabolic panel     Status: Abnormal   Collection Time: 01/19/24  9:54 AM  Result Value Ref Range   Sodium 132 (L) 135 - 145 mmol/L   Potassium 3.3 (L) 3.5 - 5.1 mmol/L   Chloride 97 (L) 98 - 111 mmol/L   CO2 25 22 - 32 mmol/L   Glucose, Bld 102 (H) 70 - 99 mg/dL    Comment: Glucose reference range applies only to samples taken after fasting for at least 8 hours.   BUN 6 6 - 20 mg/dL   Creatinine, Ser 1.32 0.44 - 1.00 mg/dL   Calcium 9.0 8.9 - 44.0 mg/dL   Total Protein 7.9 6.5 - 8.1 g/dL   Albumin 2.9 (L) 3.5 - 5.0 g/dL   AST 11 (L) 15 - 41 U/L   ALT 11 0 - 44 U/L   Alkaline Phosphatase 125 38 - 126 U/L   Total Bilirubin 0.9 0.0 - 1.2 mg/dL   GFR, Estimated >10 >27 mL/min    Comment: (NOTE) Calculated using the CKD-EPI Creatinine Equation (2021)    Anion gap 10 5 - 15    Comment: Performed at [facility], [address] [place], [place] [address]  Lactic acid, plasma     Status: None   Collection Time:  01/19/24  9:54 AM  Result Value Ref Range   Lactic Acid, Venous 1.7 0.5 - 1.9 mmol/L    Comment: Performed at [facility], [address], [place], [place] [address]  Pregnancy, urine     Status: None   Collection Time: 01/19/24 11:16 AM  Result Value Ref Range   Preg Test, Ur NEGATIVE NEGATIVE    Comment:        THE SENSITIVITY OF THIS METHODOLOGY IS >25 mIU/mL. Performed at [facility], [address] [place], [place] [address]   Lactic acid, plasma     Status: None   Collection Time: 01/19/24 11:59 AM  Result Value Ref Range   Lactic Acid, Venous 1.2 0.5 - 1.9 mmol/L    Comment: Performed at [facility], [address], [place], [place] [address]    CT ABDOMEN PELVIS W CONTRAST Result Date: 01/19/2024 CLINICAL DATA:  Acute non localized abdominal pain. EXAM: CT ABDOMEN AND PELVIS WITH CONTRAST TECHNIQUE: Multidetector CT imaging of the abdomen and pelvis was performed using the standard protocol following bolus administration of intravenous contrast. RADIATION DOSE REDUCTION: This exam was performed according to the departmental dose-optimization program which includes automated exposure control, adjustment of the mA and/or kV according to patient size and/or use of iterative reconstruction technique. CONTRAST:  OMNIPAQUE IOHEXOL 300 MG/ML  SOLN COMPARISON:  None Available. FINDINGS: Lower Chest: No acute findings. Hepatobiliary: No suspicious hepatic masses identified. Gallbladder is unremarkable. No evidence of biliary ductal dilatation. Pancreas:  No mass  or inflammatory changes. Spleen: Within normal limits in size and appearance. Adrenals/Urinary Tract: No suspicious masses identified. No evidence of ureteral calculi or hydronephrosis. Stomach/Bowel: No evidence of obstruction, inflammatory process or abnormal fluid collections. Normal appendix visualized. Vascular/Lymphatic: No pathologically enlarged lymph nodes. No acute vascular findings. Reproductive:  No mass or other  significant abnormality. Other: Skin thickening and edema in the subcutaneous fat is seen in the lower anterior abdominal wall soft tissues. Soft tissue gas is also seen in the subcutaneous tissues, with a focal low-attenuation collection measuring 4.6 x 2.7 cm, suspicious for abscess. No evidence of ventral hernia. Musculoskeletal:  No suspicious bone lesions identified. IMPRESSION: Skin thickening,subcutaneous edema, and soft tissue gas in lower anterior abdominal wall, suspicious for gas-forming infection/cellulitis. Focal 4.6 cm low-attenuation collection in abdominal wall superficial subcutaneous tissues, suspicious for abscess. Electronically Signed   By: Danae Orleans M.D.   On: 01/19/2024 14:37     Assessment & Plan:  Triva Hueber is a 38 y.o. female who presents with a 1 week history of abdominal wall pain, erythema, and swelling.  Imaging concerning for necrotizing soft tissue infection with abdominal wall abscess.  Imaging and blood work evaluated by myself.  - Discussed imaging findings with the patient, and discussed the need for operative intervention.  We discussed need for drainage of this abscess in addition to exploration of her abdominal wall and possible debridement of any necrotic tissue -The risk and benefits of incision and drainage of abdominal wall abscess and debridement of abdominal wall necrotizing soft tissue infection were discussed including but not limited to bleeding, infection, injury to surrounding structures, need for additional procedures, and poor wound healing.  After careful consideration, Suzette Flagler has decided to proceed with surgery. - Thus that patient will likely need ICU stay postoperatively.  There is a small risk that she will require intubation postoperatively.  If she remains intubated postoperatively, she will need to be transferred down to [facility].  Also discussed with the patient that if she requires vasopressor medications, she may require  central venous catheter insertion -NPO -Patient receiving clindamycin, meropenem, and vancomycin in the ED -Will obtain cultures at the time of surgery -IV fluids, 1 L NaCl bolus ordered -Patient to be admitted to hospitalist service with her other medical comorbidities -Further recommendations to follow surgery  All questions were answered to the satisfaction of the patient and family.  Note: Portions of this report may have been transcribed using voice recognition software. Every effort has been made to ensure accuracy; however, inadvertent computerized transcription errors may still be present.   -- Theophilus Kinds, DO [facility] [address] [place] [place], [place] [address] [phone] (office)

## 2024-01-19 NOTE — Anesthesia Preprocedure Evaluation (Signed)
 Anesthesia Evaluation  Patient identified by MRN, date of birth, ID band Patient awake    Reviewed: Allergy & Precautions, H&P , NPO status , Patient's Chart, lab work & pertinent test results  History of Anesthesia Complications Negative for: history of anesthetic complications  Airway Mallampati: II  TM Distance: >3 FB Neck ROM: Full    Dental  (+) Dental Advisory Given, Missing   Pulmonary asthma , Current Smoker and Patient abstained from smoking.   Pulmonary exam normal breath sounds clear to auscultation       Cardiovascular hypertension, Pt. on medications Normal cardiovascular exam Rhythm:Regular Rate:Normal     Neuro/Psych  Headaches PSYCHIATRIC DISORDERS Anxiety Depression Bipolar Disorder      GI/Hepatic Neg liver ROS,GERD (takes meds whenver she has symptoms of GERD)  Controlled,,  Endo/Other  diabetes, Well Controlled, Type 2, Insulin Dependent  Class 3 obesity  Renal/GU negative Renal ROS  negative genitourinary   Musculoskeletal negative musculoskeletal ROS (+)    Abdominal   Peds  (+) ADHD Hematology  (+) Blood dyscrasia, anemia   Anesthesia Other Findings   Reproductive/Obstetrics negative OB ROS                             Anesthesia Physical Anesthesia Plan  ASA: 3  Anesthesia Plan: General   Post-op Pain Management: Dilaudid IV   Induction: Intravenous and Rapid sequence  PONV Risk Score and Plan: 3 and Ondansetron, Dexamethasone and Midazolam  Airway Management Planned: Oral ETT  Additional Equipment:   Intra-op Plan:   Post-operative Plan: Extubation in OR  Informed Consent: I have reviewed the patients History and Physical, chart, labs and discussed the procedure including the risks, benefits and alternatives for the proposed anesthesia with the patient or authorized representative who has indicated his/her understanding and acceptance.     Dental  advisory given  Plan Discussed with: CRNA and Surgeon  Anesthesia Plan Comments:         Anesthesia Quick Evaluation

## 2024-01-19 NOTE — Op Note (Signed)
 [facility] Operative Note  01/19/24  Preoperative Diagnosis: Abdominal wall abscess with concern for necrotizing soft tissue infection   Postoperative Diagnosis: Abdominal wall necrotizing soft tissue infection, abdominal wall abscess of the subcutaneous tissues   Procedure(s) Performed: Incision, drainage, and debridement of abdominal wall necrotizing soft tissue infection, wound measuring 25 x 20 x 8 cm   Surgeon: Theophilus Kinds, DO    Assistants: Aileen Fass, MS3   Anesthesia: General endotracheal   Anesthesiologist: Ronelle Nigh, MD    Specimens: Culture obtained from abdominal wall necrotizing soft tissue infection, tissue from abdominal wall necrotizing soft tissue infection   Estimated Blood Loss: 30 cc   Blood Replacement: None    Complications: None   Wound Class: Dirty/infected   Operative Indications: Patient is a 38 year old female who presented to the hospital with a 1 week history of erythema, pain, and swelling of her abdominal wall.  In the ED, she underwent a CT of the abdomen and pelvis which demonstrated an abdominal wall abscess and subcutaneous emphysema with concern for a necrotizing infection/cellulitis.  Decision was made to take the patient to the operating room for debridement of this infection.  Patient is agreeable to surgery.  All risks and benefits of performing this procedure were discussed with the patient including pain, infection, bleeding, damage to the surrounding structures, need for more procedures or surgery, poor wound healing, pulmonary or cardiac complication. The patient voiced understanding of the procedure, all questions were sought and answered, and consent was obtained.  Findings: -Necrotizing soft tissue infection of the abdominal wall with pus and necrotic fat encountered, abdominal wall abscess of the subcutaneous tissues -Infection does not appear to extend down to the abdominal wall fascia or to the  intra-abdominal/organ space (confirmed both intraoperatively and on cross-sectional imaging) -Hemostasis noted at the completion of the case   Procedure: The patient was taken to the operating room and placed supine. General endotracheal anesthesia was induced. Intravenous antibiotics were administered per protocol.  The abdomen was prepared and draped in the usual sterile fashion.  A time-out was completed verifying correct patient, procedure, site, positioning, and implant(s) and/or special equipment prior to beginning this procedure.  An incision was made over top of an area of fluctuance and erythema to the right of the umbilicus.  This incision was carried down through the skin and fat, and an abdominal wall abscess was encountered with associated necrotic surrounding fat. Upon entering the subcutaneous tissues, an abscess with necrotizing soft tissue infection was noted.  Wound culture was obtained of the abscess and necrotizing soft tissue infection.  All grossly infected tissue including the subcutaneous tissue, fat, and skin were sharply excised, and hemostasis was achieved with both suture ligation of bleeding vessels and judicious use of electrocautery.  The necrotic tissue was removed to healthy tissue with arterial bleeding.  The infection did not extend down to the fascia, and does not involve the intra-abdominal/organ space.  The cavity was then irrigated with a Pulsavac.  Incision site was localized with Marcaine.  Hemostasis was again achieved.  The debridement area measured 25 x 20 x 8 cm.  The cavity was packed with 6 rolls of Dakin's soaked Kerlix.  The wound was then dressed with ABD pads and Medipore tape.  Final inspection revealed acceptable hemostasis. All counts were correct at the end of the case. The patient was awakened from anesthesia and extubated without complication.  The patient went to the PACU in stable condition with plan to be monitored  in the ICU overnight.   Theophilus Kinds, DO  [facility] [address] [place], [place] [address] [phone] (office)

## 2024-01-19 NOTE — Brief Op Note (Signed)
 01/19/2024  7:25 PM  PATIENT:  Judy Nelson  38 y.o. female  PRE-OPERATIVE DIAGNOSIS:  ABDOMINAL WALL ABSCESS  POST-OPERATIVE DIAGNOSIS:  ABDOMINAL WALL ABSCESS, NECROTIZING SOFT TISSUE INFECTION  PROCEDURE:  Procedure(s): INCISION AND DRAINAGE ABDOMINAL WALL ABSCESS (N/A) DEBRIDEMENT OF ABDOMINAL WALL NECROTIZING SOFT TISSUE INFECTION, 25 X 20 X 8 CM  SURGEON:  Surgeons and Role:    * Arris Meyn, Gustavus Messing, DO - Primary  PHYSICIAN ASSISTANT:   ASSISTANTS: Aileen Fass, MS3   ANESTHESIA:   general  EBL:  30 cc   BLOOD ADMINISTERED:none  DRAINS: none   LOCAL MEDICATIONS USED:  MARCAINE     SPECIMEN:  Source of Specimen:  culture of abdominal wall necrotizing soft tissue infection, tissue from abdominal wall necrotizing soft tissue infection  DISPOSITION OF SPECIMEN:  PATHOLOGY  COUNTS:  YES  DICTATION: .Note written in EPIC  PLAN OF CARE: Admit to inpatient   PATIENT DISPOSITION:  ICU - extubated and stable.   Delay start of Pharmacological VTE agent (>24hrs) due to surgical blood loss or risk of bleeding: no  Theophilus Kinds, DO [facility] [address] [place] [place], [place] [address] [phone] (office)

## 2024-01-19 NOTE — ED Notes (Signed)
 ED TO INPATIENT HANDOFF REPORT  ED Nurse Name and Phone #: [phone]   S Name/Age/Gender Judy Nelson 38 y.o. female Room/Bed: APA03/APA03  Code Status   Code Status: Prior  Home/SNF/Other Home Patient oriented to: self, place, time, and situation Is this baseline? Yes   Triage Complete: Triage complete  Chief Complaint Abdominal wall abscess [L02.211]  Triage Note Pt brought in by [facility] from home with c/o possible abscess to abdomen. Pt reports the area is tender, painful to touch, warm, red, and swollen x 1 week.    Allergies Allergies  Allergen Reactions   Penicillin G Anaphylaxis and Swelling    Throat swelling    Level of Care/Admitting Diagnosis ED Disposition     ED Disposition  Admit   Condition  --   Comment  Hospital Area: [facility] [[id number]]  Level of Care: Stepdown [14]  Covid Evaluation: Asymptomatic - no recent exposure (last 10 days) testing not required  Diagnosis: Abdominal wall abscess [536644]  Admitting Physician: Levie Heritage [[id number]]  Attending Physician: Levie Heritage [[id number]]  Certification:: I certify this patient will need inpatient services for at least 2 midnights  Expected Medical Readiness: 01/23/2024          B Medical/Surgery History Past Medical History:  Diagnosis Date   Anemia    Anxiety    Asthma    CSF leak    Depression    GERD (gastroesophageal reflux disease)    Heartburn 09/01/2015   no current med.   History of anemia 01/2014   Hypertension    Non-insulin dependent type 2 diabetes mellitus (HCC)    Obesity    PTSD (post-traumatic stress disorder)    Scar of breast 08/2015   left   Sinus headache    Past Surgical History:  Procedure Laterality Date   BREAST SURGERY Bilateral    bilateral breast surgery to remove cyst at [facility] 2017   CRANIOTOMY N/A 06/30/2017   Procedure: BIFRONTAL CRANIOTOMY;  Surgeon: Lisbeth Renshaw, MD;  Location: [facility] OR;  Service: Neurosurgery;  Laterality:  N/A;  BIFRONTAL CRANIOTOMY Repair of Encephalocele   GANGLION CYST EXCISION Left 02/07/2023   Procedure: REMOVAL GANGLION OF WRIST;  Surgeon: Vickki Hearing, MD;  Location: [facility];  Service: Orthopedics;  Laterality: Left;  LMA   INCISION AND DRAINAGE ABSCESS Left 10/07/2014   Procedure: INCISION AND DRAINAGE LEFT BREAST ABSCESS;  Surgeon: Dalia Heading, MD;  Location: [facility];  Service: General;  Laterality: Left;   LAPAROSCOPIC OVARIAN CYSTECTOMY Right 02/05/2014   Procedure: LAPAROSCOPIC right retroperitoneal (NOT OVARIAN) CYSTECTOMY;  Surgeon: Lazaro Arms, MD;  Location: [facility];  Service: Gynecology;  Laterality: Right;   MASS EXCISION Left 09/07/2015   Procedure: Minor SCAR EXCISION LEFT BREAST, PLASTIC CLOSURE;  Surgeon: Louisa Second, MD;  Location: [facility];  Service: Plastics;  Laterality: Left;   TYMPANOSTOMY TUBE PLACEMENT Bilateral 10/17/2014     A IV Location/Drains/Wounds Patient Lines/Drains/Airways Status     Active Line/Drains/Airways     Name Placement date Placement time Site Days   Peripheral IV 01/19/24 20 G 1" Left Antecubital 01/19/24  0956  Antecubital  less than 1            Intake/Output Last 24 hours No intake or output data in the 24 hours ending 01/19/24 1605  Labs/Imaging Results for orders placed or performed during the hospital encounter of 01/19/24 (from the past 48 hours)  CBG monitoring, ED     Status:  Abnormal   Collection Time: 01/19/24  8:44 AM  Result Value Ref Range   Glucose-Capillary 62 (L) 70 - 99 mg/dL    Comment: Glucose reference range applies only to samples taken after fasting for at least 8 hours.  CBG monitoring, ED     Status: Abnormal   Collection Time: 01/19/24  9:41 AM  Result Value Ref Range   Glucose-Capillary 107 (H) 70 - 99 mg/dL    Comment: Glucose reference range applies only to samples taken after fasting for at least 8 hours.  CBC with Differential     Status: Abnormal   Collection  Time: 01/19/24  9:54 AM  Result Value Ref Range   WBC 31.6 (H) 4.0 - 10.5 K/uL   RBC 4.77 3.87 - 5.11 MIL/uL   Hemoglobin 13.0 12.0 - 15.0 g/dL   HCT 19.1 47.8 - 29.5 %   MCV 82.0 80.0 - 100.0 fL   MCH 27.3 26.0 - 34.0 pg   MCHC 33.2 30.0 - 36.0 g/dL   RDW 62.1 30.8 - 65.7 %   Platelets 294 150 - 400 K/uL   nRBC 0.0 0.0 - 0.2 %   Neutrophils Relative % 83 %   Neutro Abs 26.1 (H) 1.7 - 7.7 K/uL   Lymphocytes Relative 7 %   Lymphs Abs 2.3 0.7 - 4.0 K/uL   Monocytes Relative 8 %   Monocytes Absolute 2.6 (H) 0.1 - 1.0 K/uL   Eosinophils Relative 0 %   Eosinophils Absolute 0.0 0.0 - 0.5 K/uL   Basophils Relative 0 %   Basophils Absolute 0.1 0.0 - 0.1 K/uL   RBC Morphology MORPHOLOGY UNREMARKABLE    Smear Review MORPHOLOGY UNREMARKABLE    Immature Granulocytes 2 %   Abs Immature Granulocytes 0.52 (H) 0.00 - 0.07 K/uL   Smudge Cells PRESENT     Comment: Performed at [facility], [address], [place], [place] [address]  Comprehensive metabolic panel     Status: Abnormal   Collection Time: 01/19/24  9:54 AM  Result Value Ref Range   Sodium 132 (L) 135 - 145 mmol/L   Potassium 3.3 (L) 3.5 - 5.1 mmol/L   Chloride 97 (L) 98 - 111 mmol/L   CO2 25 22 - 32 mmol/L   Glucose, Bld 102 (H) 70 - 99 mg/dL    Comment: Glucose reference range applies only to samples taken after fasting for at least 8 hours.   BUN 6 6 - 20 mg/dL   Creatinine, Ser 2.95 0.44 - 1.00 mg/dL   Calcium 9.0 8.9 - 28.4 mg/dL   Total Protein 7.9 6.5 - 8.1 g/dL   Albumin 2.9 (L) 3.5 - 5.0 g/dL   AST 11 (L) 15 - 41 U/L   ALT 11 0 - 44 U/L   Alkaline Phosphatase 125 38 - 126 U/L   Total Bilirubin 0.9 0.0 - 1.2 mg/dL   GFR, Estimated >13 >24 mL/min    Comment: (NOTE) Calculated using the CKD-EPI Creatinine Equation (2021)    Anion gap 10 5 - 15    Comment: Performed at [facility], [address], [place], [place] [address]  Lactic acid, plasma     Status: None   Collection Time: 01/19/24  9:54 AM  Result Value  Ref Range   Lactic Acid, Venous 1.7 0.5 - 1.9 mmol/L    Comment: Performed at [facility], [address], [place], [place] [address]  Pregnancy, urine     Status: None   Collection Time: 01/19/24 11:16  AM  Result Value Ref Range   Preg Test, Ur NEGATIVE NEGATIVE    Comment:        THE SENSITIVITY OF THIS METHODOLOGY IS >25 mIU/mL. Performed at [facility], [address], [place], [place] [address]   Lactic acid, plasma     Status: None   Collection Time: 01/19/24 11:59 AM  Result Value Ref Range   Lactic Acid, Venous 1.2 0.5 - 1.9 mmol/L    Comment: Performed at [facility], [address] [place], [place] [address]   CT ABDOMEN PELVIS W CONTRAST Result Date: 01/19/2024 CLINICAL DATA:  Acute non localized abdominal pain. EXAM: CT ABDOMEN AND PELVIS WITH CONTRAST TECHNIQUE: Multidetector CT imaging of the abdomen and pelvis was performed using the standard protocol following bolus administration of intravenous contrast. RADIATION DOSE REDUCTION: This exam was performed according to the departmental dose-optimization program which includes automated exposure control, adjustment of the mA and/or kV according to patient size and/or use of iterative reconstruction technique. CONTRAST:  OMNIPAQUE IOHEXOL 300 MG/ML  SOLN COMPARISON:  None Available. FINDINGS: Lower Chest: No acute findings. Hepatobiliary: No suspicious hepatic masses identified. Gallbladder is unremarkable. No evidence of biliary ductal dilatation. Pancreas:  No mass or inflammatory changes. Spleen: Within normal limits in size and appearance. Adrenals/Urinary Tract: No suspicious masses identified. No evidence of ureteral calculi or hydronephrosis. Stomach/Bowel: No evidence of obstruction, inflammatory process or abnormal fluid collections. Normal appendix visualized. Vascular/Lymphatic: No pathologically enlarged lymph nodes. No acute vascular findings. Reproductive:  No mass or other significant abnormality. Other: Skin  thickening and edema in the subcutaneous fat is seen in the lower anterior abdominal wall soft tissues. Soft tissue gas is also seen in the subcutaneous tissues, with a focal low-attenuation collection measuring 4.6 x 2.7 cm, suspicious for abscess. No evidence of ventral hernia. Musculoskeletal:  No suspicious bone lesions identified. IMPRESSION: Skin thickening,subcutaneous edema, and soft tissue gas in lower anterior abdominal wall, suspicious for gas-forming infection/cellulitis. Focal 4.6 cm low-attenuation collection in abdominal wall superficial subcutaneous tissues, suspicious for abscess. Electronically Signed   By: Danae Orleans M.D.   On: 01/19/2024 14:37    Pending Labs Unresulted Labs (From admission, onward)    None       Vitals/Pain Today's Vitals   01/19/24 0839 01/19/24 0841 01/19/24 0859 01/19/24 1201  BP:  124/67 124/67   Pulse:  (!) 112    Resp:  19    Temp:  98.5 F (36.9 C)    TempSrc:  Oral    SpO2:  97%    Weight: (!) 137.9 kg     Height: 5' 7.5" (1.715 m)     PainSc: 10-Worst pain ever   10-Worst pain ever    Isolation Precautions No active isolations  Medications Medications  vancomycin (VANCOREADY) IVPB 2000 mg/400 mL (2,000 mg Intravenous New Bag/Given 01/19/24 1510)  clindamycin (CLEOCIN) IVPB 900 mg (has no administration in time range)  meropenem (MERREM) 1 g in sodium chloride 0.9 % 100 mL IVPB (has no administration in time range)  ondansetron (ZOFRAN) injection 4 mg (4 mg Intravenous Given 01/19/24 1007)  HYDROmorphone (DILAUDID) injection 0.5 mg (0.5 mg Intravenous Given 01/19/24 1201)  gabapentin (NEURONTIN) capsule 800 mg (800 mg Oral Given 01/19/24 1231)  LORazepam (ATIVAN) injection 1 mg (1 mg Intravenous Given 01/19/24 1231)  iohexol (OMNIPAQUE) 300 MG/ML solution 100 mL (100 mLs Intravenous Contrast Given 01/19/24 1349)    Mobility walks     Focused Assessments     R  Recommendations: See Admitting Provider Note  Report given to:    Additional Notes:

## 2024-01-19 NOTE — H&P (Signed)
 History and Physical    Patient: Judy Nelson ZOX:[id number] DOB: [birth year] DOA: 01/19/2024 DOS: the patient was seen and examined on 01/19/2024 PCP: Lianne Moris, PA-C  Patient coming from: Home  Chief Complaint:  Chief Complaint  Patient presents with   Skin Problem   HPI: Judy Nelson is a 38 y.o. female with medical history significant of diabetes, hypertension, GERD, asthma.  Patient seen for infection in her lower abdominal wall that started earlier this week.  It has increased in size fairly dramatically since it started up.  She did have fevers and chills last night.  She has been using topical antibiotic cream which has not been helpful.  Here, she had a white count of 31.6.  She is slightly hyponatremic and does not have a lactic acidosis.  She did have a CT of the abdomen pelvis which showed abscess with a fluid collection of about 4.6 cm with gas formation and the infection/cellulitis.  General surgery was consulted will take her for surgery tonight.  Review of Systems: As mentioned in the history of present illness. All other systems reviewed and are negative. Past Medical History:  Diagnosis Date   Anemia    Anxiety    Asthma    CSF leak    Depression    GERD (gastroesophageal reflux disease)    Heartburn 09/01/2015   no current med.   History of anemia 01/2014   Hypertension    Non-insulin dependent type 2 diabetes mellitus (HCC)    Obesity    PTSD (post-traumatic stress disorder)    Scar of breast 08/2015   left   Sinus headache    Past Surgical History:  Procedure Laterality Date   BREAST SURGERY Bilateral    bilateral breast surgery to remove cyst at [facility] 2017   CRANIOTOMY N/A 06/30/2017   Procedure: BIFRONTAL CRANIOTOMY;  Surgeon: Lisbeth Renshaw, MD;  Location: [facility] OR;  Service: Neurosurgery;  Laterality: N/A;  BIFRONTAL CRANIOTOMY Repair of Encephalocele   GANGLION CYST EXCISION Left 02/07/2023   Procedure: REMOVAL GANGLION OF WRIST;  Surgeon:  Vickki Hearing, MD;  Location: [facility];  Service: Orthopedics;  Laterality: Left;  LMA   INCISION AND DRAINAGE ABSCESS Left 10/07/2014   Procedure: INCISION AND DRAINAGE LEFT BREAST ABSCESS;  Surgeon: Dalia Heading, MD;  Location: [facility];  Service: General;  Laterality: Left;   LAPAROSCOPIC OVARIAN CYSTECTOMY Right 02/05/2014   Procedure: LAPAROSCOPIC right retroperitoneal (NOT OVARIAN) CYSTECTOMY;  Surgeon: Lazaro Arms, MD;  Location: [facility];  Service: Gynecology;  Laterality: Right;   MASS EXCISION Left 09/07/2015   Procedure: Minor SCAR EXCISION LEFT BREAST, PLASTIC CLOSURE;  Surgeon: Louisa Second, MD;  Location: [facility];  Service: Plastics;  Laterality: Left;   TYMPANOSTOMY TUBE PLACEMENT Bilateral 10/17/2014   Social History:  reports that she has been smoking cigarettes. She has a 3.5 pack-year smoking history. She has never used smokeless tobacco. She reports that she does not currently use alcohol. She reports current drug use. Drug: Marijuana.  Allergies  Allergen Reactions   Penicillin G Anaphylaxis and Swelling    Throat swelling    Family History  Problem Relation Age of Onset   Diabetes Father    Hypertension Father    Heart disease Daughter        heart murmur   Cancer Maternal Grandmother        ovarian cancer   Obesity Paternal Grandmother    Diabetes Paternal Grandfather     Prior to Admission  medications   Medication Sig Start Date End Date Taking? Authorizing Provider  acetaminophen (TYLENOL) 500 MG tablet Take 1 tablet (500 mg total) by mouth every 6 (six) hours as needed. 02/07/23 02/07/24  Vickki Hearing, MD  albuterol (PROVENTIL HFA;VENTOLIN HFA) 108 (90 Base) MCG/ACT inhaler Inhale 2 puffs into the lungs every 6 (six) hours as needed for wheezing or shortness of breath.    [provider]  ALPRAZolam Prudy Feeler) 1 MG tablet TAKE 1 TABLET BY MOUTH THREE TIMES DAILY 07/26/16   Lazaro Arms, MD   amphetamine-dextroamphetamine (ADDERALL) 30 MG tablet Take 15 mg by mouth daily as needed (focusing).    [provider]  Bioflavonoid Products (VITAMIN C) CHEW Chew 2 tablets by mouth daily.    [provider]  clindamycin (CLINDAGEL) 1 % gel Apply topically 2 (two) times daily. 09/19/23   Lazaro Arms, MD  Continuous Glucose Receiver (DEXCOM G7 RECEIVER) DEVI Use to monitor BG continuously 02/07/23   Dani Gobble, NP  Continuous Glucose Sensor (DEXCOM G7 SENSOR) MISC CHANGE SENSOR EVERY 10 DAYS 01/17/24   Roma Kayser, MD  dapagliflozin propanediol (FARXIGA) 10 MG TABS tablet Take 1 tablet (10 mg total) by mouth daily before breakfast. 10/25/23   Nida, Denman George, MD  doxycycline (VIBRAMYCIN) 100 MG capsule Take 1 capsule (100 mg total) by mouth 2 (two) times daily. 11/05/23   Mayers, Cari S, PA-C  DULoxetine (CYMBALTA) 60 MG capsule Take 60 mg by mouth 2 (two) times daily.    [provider]  ELDERBERRY PO Take 2 tablets by mouth daily.    [provider]  Emollient (GOLD BOND DIABETICS DRY SKIN) CREA Apply 1 Application topically daily as needed (pain). Uses with aspercreme    [provider]  fluticasone (FLONASE) 50 MCG/ACT nasal spray Place 2 sprays into both nostrils daily. 11/05/23   Mayers, Cari S, PA-C  gabapentin (NEURONTIN) 800 MG tablet Take 800 mg by mouth 2 (two) times daily. 11/15/22   [provider]  hydrOXYzine (ATARAX) 50 MG tablet Take 100 mg by mouth at bedtime.    [provider]  ibuprofen (ADVIL) 800 MG tablet Take 1 tablet (800 mg total) by mouth every 8 (eight) hours as needed. 02/07/23   Vickki Hearing, MD  insulin aspart (NOVOLOG FLEXPEN) 100 UNIT/ML FlexPen Inject 10-16 Units into the skin 3 (three) times daily before meals. 06/22/23   Roma Kayser, MD  insulin glargine (LANTUS) 100 UNIT/ML injection Inject 80 Units into the skin at bedtime.    [provider]  OVER THE  COUNTER MEDICATION Take 1 Dose by mouth daily. [place]    [provider]  oxyCODONE-acetaminophen (PERCOCET) 10-325 MG tablet Take 1 tablet by mouth every 4 (four) hours as needed for pain.    [provider]  Phenylephrine-Acetaminophen (TYLENOL SINUS+HEADACHE PO) Take 2 tablets by mouth 2 (two) times daily as needed (sinus pain).    [provider]  rosuvastatin (CRESTOR) 10 MG tablet Take 1 tablet (10 mg total) by mouth daily. 08/11/22   Roma Kayser, MD  Semaglutide, 1 MG/DOSE, 4 MG/3ML SOPN Inject 1 mg as directed once a week. 10/25/23   Roma Kayser, MD  silver sulfADIAZINE (SILVADENE) 1 % cream Use to area 2-3 times daily 01/25/23   Lazaro Arms, MD  silver sulfADIAZINE (SILVADENE) 1 % cream Apply as directed 09/19/23   Lazaro Arms, MD  solifenacin (VESICARE) 10 MG tablet Take 1 tablet (  10 mg total) by mouth daily. 01/25/23   Lazaro Arms, MD  trolamine salicylate (ASPERCREME) 10 % cream Apply 1 Application topically as needed for muscle pain.    [provider]    Physical Exam: Vitals:   01/19/24 0865 01/19/24 0841 01/19/24 0859  BP:  124/67 124/67  Pulse:  (!) 112   Resp:  19   Temp:  98.5 F (36.9 C)   TempSrc:  Oral   SpO2:  97%   Weight: (!) 137.9 kg    Height: 5' 7.5" (1.715 m)     General: young female. Awake and alert and oriented x3. No acute cardiopulmonary distress.  HEENT: Normocephalic atraumatic.  Right and left ears normal in appearance.  Pupils equal, round, reactive to light. Extraocular muscles are intact. Sclerae anicteric and noninjected.  Moist mucosal membranes. No mucosal lesions.  Neck: Neck supple without lymphadenopathy. No carotid bruits. No masses palpated.  Cardiovascular: Regular rate with normal S1-S2 sounds. No murmurs, rubs, gallops auscultated. No JVD.  Respiratory: Good respiratory effort with no wheezes, rales, rhonchi. Lungs clear to auscultation bilaterally.  No accessory muscle  use. Abdomen: Soft, nontender, nondistended. Active bowel sounds. No masses or hepatosplenomegaly  Skin: lower abdominal wall cellulitis and abscess - see picture below.  Dry, warm to touch. 2+ dorsalis pedis and radial pulses. Musculoskeletal: No calf or leg pain. All major joints not erythematous nontender.  No upper or lower joint deformation.  Good ROM.  No contractures  Psychiatric: Intact judgment and insight. Pleasant and cooperative. Neurologic: No focal neurological deficits. Strength is 5/5 and symmetric in upper and lower extremities.  Cranial nerves II through XII are grossly intact.    Media Information   Document Information    Data Reviewed: Results for orders placed or performed during the hospital encounter of 01/19/24 (from the past 24 hours)  CBG monitoring, ED     Status: Abnormal   Collection Time: 01/19/24  8:44 AM  Result Value Ref Range   Glucose-Capillary 62 (L) 70 - 99 mg/dL  CBG monitoring, ED     Status: Abnormal   Collection Time: 01/19/24  9:41 AM  Result Value Ref Range   Glucose-Capillary 107 (H) 70 - 99 mg/dL  CBC with Differential     Status: Abnormal   Collection Time: 01/19/24  9:54 AM  Result Value Ref Range   WBC 31.6 (H) 4.0 - 10.5 K/uL   RBC 4.77 3.87 - 5.11 MIL/uL   Hemoglobin 13.0 12.0 - 15.0 g/dL   HCT 78.4 69.6 - 29.5 %   MCV 82.0 80.0 - 100.0 fL   MCH 27.3 26.0 - 34.0 pg   MCHC 33.2 30.0 - 36.0 g/dL   RDW 28.4 13.2 - 44.0 %   Platelets 294 150 - 400 K/uL   nRBC 0.0 0.0 - 0.2 %   Neutrophils Relative % 83 %   Neutro Abs 26.1 (H) 1.7 - 7.7 K/uL   Lymphocytes Relative 7 %   Lymphs Abs 2.3 0.7 - 4.0 K/uL   Monocytes Relative 8 %   Monocytes Absolute 2.6 (H) 0.1 - 1.0 K/uL   Eosinophils Relative 0 %   Eosinophils Absolute 0.0 0.0 - 0.5 K/uL   Basophils Relative 0 %   Basophils Absolute 0.1 0.0 - 0.1 K/uL   RBC Morphology MORPHOLOGY UNREMARKABLE    Smear Review MORPHOLOGY UNREMARKABLE    Immature Granulocytes 2 %   Abs Immature  Granulocytes 0.52 (H) 0.00 - 0.07 K/uL   Smudge Cells  PRESENT   Comprehensive metabolic panel     Status: Abnormal   Collection Time: 01/19/24  9:54 AM  Result Value Ref Range   Sodium 132 (L) 135 - 145 mmol/L   Potassium 3.3 (L) 3.5 - 5.1 mmol/L   Chloride 97 (L) 98 - 111 mmol/L   CO2 25 22 - 32 mmol/L   Glucose, Bld 102 (H) 70 - 99 mg/dL   BUN 6 6 - 20 mg/dL   Creatinine, Ser 7.82 0.44 - 1.00 mg/dL   Calcium 9.0 8.9 - 95.6 mg/dL   Total Protein 7.9 6.5 - 8.1 g/dL   Albumin 2.9 (L) 3.5 - 5.0 g/dL   AST 11 (L) 15 - 41 U/L   ALT 11 0 - 44 U/L   Alkaline Phosphatase 125 38 - 126 U/L   Total Bilirubin 0.9 0.0 - 1.2 mg/dL   GFR, Estimated >21 >30 mL/min   Anion gap 10 5 - 15  Lactic acid, plasma     Status: None   Collection Time: 01/19/24  9:54 AM  Result Value Ref Range   Lactic Acid, Venous 1.7 0.5 - 1.9 mmol/L  Pregnancy, urine     Status: None   Collection Time: 01/19/24 11:16 AM  Result Value Ref Range   Preg Test, Ur NEGATIVE NEGATIVE  Lactic acid, plasma     Status: None   Collection Time: 01/19/24 11:59 AM  Result Value Ref Range   Lactic Acid, Venous 1.2 0.5 - 1.9 mmol/L    CT ABDOMEN PELVIS W CONTRAST Result Date: 01/19/2024 CLINICAL DATA:  Acute non localized abdominal pain. EXAM: CT ABDOMEN AND PELVIS WITH CONTRAST TECHNIQUE: Multidetector CT imaging of the abdomen and pelvis was performed using the standard protocol following bolus administration of intravenous contrast. RADIATION DOSE REDUCTION: This exam was performed according to the departmental dose-optimization program which includes automated exposure control, adjustment of the mA and/or kV according to patient size and/or use of iterative reconstruction technique. CONTRAST:  OMNIPAQUE IOHEXOL 300 MG/ML  SOLN COMPARISON:  None Available. FINDINGS: Lower Chest: No acute findings. Hepatobiliary: No suspicious hepatic masses identified. Gallbladder is unremarkable. No evidence of biliary ductal dilatation.  Pancreas:  No mass or inflammatory changes. Spleen: Within normal limits in size and appearance. Adrenals/Urinary Tract: No suspicious masses identified. No evidence of ureteral calculi or hydronephrosis. Stomach/Bowel: No evidence of obstruction, inflammatory process or abnormal fluid collections. Normal appendix visualized. Vascular/Lymphatic: No pathologically enlarged lymph nodes. No acute vascular findings. Reproductive:  No mass or other significant abnormality. Other: Skin thickening and edema in the subcutaneous fat is seen in the lower anterior abdominal wall soft tissues. Soft tissue gas is also seen in the subcutaneous tissues, with a focal low-attenuation collection measuring 4.6 x 2.7 cm, suspicious for abscess. No evidence of ventral hernia. Musculoskeletal:  No suspicious bone lesions identified. IMPRESSION: Skin thickening,subcutaneous edema, and soft tissue gas in lower anterior abdominal wall, suspicious for gas-forming infection/cellulitis. Focal 4.6 cm low-attenuation collection in abdominal wall superficial subcutaneous tissues, suspicious for abscess. Electronically Signed   By: Danae Orleans M.D.   On: 01/19/2024 14:37     Assessment and Plan: No notes have been filed under this hospital service. Service: Hospitalist  Principal Problem:   Abdominal wall abscess Active Problems:   Type 2 diabetes mellitus without complication (HCC)   Morbid obesity with BMI of 50.0-59.9, adult (HCC)  Abdominal wall abscess and cellulitis Surgery tonight Cultures to be obtained Broad spectrum antibiotics: Vanc, clinda, meropenem.  Check MRSA screen  Type 2 diabetes Lantus 40 units at bedtime - may need to decrease depending on diet after surgery SSI   Advance Care Planning:   Code Status: Prior full code  Consults: gen surgery  Family Communication: mother on the phone  Severity of Illness: The appropriate patient status for this patient is INPATIENT. Inpatient status is judged to be  reasonable and necessary in order to provide the required intensity of service to ensure the patient's safety. The patient's presenting symptoms, physical exam findings, and initial radiographic and laboratory data in the context of their chronic comorbidities is felt to place them at high risk for further clinical deterioration. Furthermore, it is not anticipated that the patient will be medically stable for discharge from the hospital within 2 midnights of admission.   * I certify that at the point of admission it is my clinical judgment that the patient will require inpatient hospital care spanning beyond 2 midnights from the point of admission due to high intensity of service, high risk for further deterioration and high frequency of surveillance required.*  Author: Levie Heritage, DO 01/19/2024 5:00 PM  For on call review [email or web address].

## 2024-01-19 NOTE — Anesthesia Postprocedure Evaluation (Signed)
 Anesthesia Post Note  Patient: Janyia Guion  Procedure(s) Performed: INCISION AND DRAINAGE ABDOMINAL WALL ABSCESS DEBRIDEMENT OF ABDOMINAL WALL NECROTIZING SOFT TISSUE INFECTION, 25 X 20 X 8 CM  Patient location during evaluation: PACU Anesthesia Type: General Level of consciousness: awake and alert Pain management: pain level controlled Vital Signs Assessment: post-procedure vital signs reviewed and stable Respiratory status: spontaneous breathing, nonlabored ventilation, respiratory function stable and patient connected to nasal cannula oxygen Cardiovascular status: blood pressure returned to baseline and stable Postop Assessment: no apparent nausea or vomiting Anesthetic complications: no   There were no known notable events for this encounter.   Last Vitals:  Vitals:   01/19/24 0841 01/19/24 0859  BP: 124/67 124/67  Pulse: (!) 112   Resp: 19   Temp: 36.9 C   SpO2: 97%     Last Pain:  Vitals:   01/19/24 1201  TempSrc:   PainSc: 10-Worst pain ever                 Gaetano Hawthorne

## 2024-01-19 NOTE — Progress Notes (Signed)
 [facility]  Spoke with the patient's friend in the waiting room.  I explained that she tolerated the procedure without difficulty.   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  She has a dressing in place.  She was stable throughout the case, but she will still go to the ICU overnight for close clinical monitoring.  She will have a wound on her abdomen which will take several months to heal.  Pending how the wound looks over the weekend, that we will determine if we need to go back to the operating room for further debridement.  All questions were answered to her expressed satisfaction.  Plan: -Patient to be admitted to ICU for close clinical monitoring overnight -Okay for diet today and tomorrow -Continue Rocephin, meropenem, and clindamycin. -Follow-up wound cultures -PRN pain control and antiemetics -IV fluids -Will plan for bedside dressing change tomorrow versus possible need for dressing change in OR on either Sunday or Monday -Appreciate hospitalist recommendations  Theophilus Kinds, DO [facility] [address] Vella Raring [place], [place] [address] [phone] (office)

## 2024-01-19 NOTE — ED Notes (Signed)
 Patient has a "rash" on her abd that is hard, red, and warm to the touch. Patient states it has been going on for a week and denies any trauma, or insect bites.

## 2024-01-19 NOTE — ED Notes (Signed)
 Patient given 8 oz of orange juice after checking CBG, will recheck CBG

## 2024-01-19 NOTE — ED Provider Notes (Signed)
  EMERGENCY DEPARTMENT AT [facility] Provider Note   CSN: [id number] Arrival date & time: 01/19/24  0865     History  Chief Complaint  Patient presents with   Skin Problem    Judy Nelson is a 38 y.o. female.  HPI     Judy Nelson is a 38 y.o. female Past medical history of type 2 diabetes, also takes insulin, anxiety, anemia, hypertension, PTSD who presents to the Emergency Department complaining of lower abdominal pain and mild redness to the lower abdominal wall.  She describes localized tenderness to palpation of her abdomen.  Symptoms have been present for 1 week.  She is concerned that she may have rash to the area.  She notes some intermittent chills without definite fever.  Denies any nausea vomiting, flank pain or dysuria  Home Medications Prior to Admission medications   Medication Sig Start Date End Date Taking? Authorizing Provider  acetaminophen (TYLENOL) 500 MG tablet Take 1 tablet (500 mg total) by mouth every 6 (six) hours as needed. 02/07/23 02/07/24  Vickki Hearing, MD  albuterol (PROVENTIL HFA;VENTOLIN HFA) 108 (90 Base) MCG/ACT inhaler Inhale 2 puffs into the lungs every 6 (six) hours as needed for wheezing or shortness of breath.    [provider]  ALPRAZolam Prudy Feeler) 1 MG tablet TAKE 1 TABLET BY MOUTH THREE TIMES DAILY 07/26/16   Lazaro Arms, MD  amphetamine-dextroamphetamine (ADDERALL) 30 MG tablet Take 15 mg by mouth daily as needed (focusing).    [provider]  Bioflavonoid Products (VITAMIN C) CHEW Chew 2 tablets by mouth daily.    [provider]  clindamycin (CLINDAGEL) 1 % gel Apply topically 2 (two) times daily. 09/19/23   Lazaro Arms, MD  Continuous Glucose Receiver (DEXCOM G7 RECEIVER) DEVI Use to monitor BG continuously 02/07/23   Dani Gobble, NP  Continuous Glucose Sensor (DEXCOM G7 SENSOR) MISC CHANGE SENSOR EVERY 10 DAYS 01/17/24   Roma Kayser, MD  dapagliflozin propanediol  (FARXIGA) 10 MG TABS tablet Take 1 tablet (10 mg total) by mouth daily before breakfast. 10/25/23   Nida, Denman George, MD  doxycycline (VIBRAMYCIN) 100 MG capsule Take 1 capsule (100 mg total) by mouth 2 (two) times daily. 11/05/23   Mayers, Cari S, PA-C  DULoxetine (CYMBALTA) 60 MG capsule Take 60 mg by mouth 2 (two) times daily.    [provider]  ELDERBERRY PO Take 2 tablets by mouth daily.    [provider]  Emollient (GOLD BOND DIABETICS DRY SKIN) CREA Apply 1 Application topically daily as needed (pain). Uses with aspercreme    [provider]  fluticasone (FLONASE) 50 MCG/ACT nasal spray Place 2 sprays into both nostrils daily. 11/05/23   Mayers, Cari S, PA-C  gabapentin (NEURONTIN) 800 MG tablet Take 800 mg by mouth 2 (two) times daily. 11/15/22   [provider]  hydrOXYzine (ATARAX) 50 MG tablet Take 100 mg by mouth at bedtime.    [provider]  ibuprofen (ADVIL) 800 MG tablet Take 1 tablet (800 mg total) by mouth every 8 (eight) hours as needed. 02/07/23   Vickki Hearing, MD  insulin aspart (NOVOLOG FLEXPEN) 100 UNIT/ML FlexPen Inject 10-16 Units into the skin 3 (three) times daily before meals. 06/22/23   Roma Kayser, MD  insulin glargine (LANTUS) 100 UNIT/ML injection Inject 80 Units into the skin at bedtime.    [provider]  OVER THE COUNTER MEDICATION Take 1 Dose by mouth daily. Sea  Mosss    [provider]  oxyCODONE-acetaminophen (PERCOCET) 10-325 MG tablet Take 1 tablet by mouth every 4 (four) hours as needed for pain.    [provider]  Phenylephrine-Acetaminophen (TYLENOL SINUS+HEADACHE PO) Take 2 tablets by mouth 2 (two) times daily as needed (sinus pain).    [provider]  rosuvastatin (CRESTOR) 10 MG tablet Take 1 tablet (10 mg total) by mouth daily. 08/11/22   Roma Kayser, MD  Semaglutide, 1 MG/DOSE, 4 MG/3ML SOPN Inject 1 mg as directed once a week. 10/25/23   Roma Kayser, MD  silver sulfADIAZINE (SILVADENE) 1 % cream Use to area 2-3 times daily 01/25/23   Lazaro Arms, MD  silver sulfADIAZINE (SILVADENE) 1 % cream Apply as directed 09/19/23   Lazaro Arms, MD  solifenacin (VESICARE) 10 MG tablet Take 1 tablet (10 mg total) by mouth daily. 01/25/23   Lazaro Arms, MD  trolamine salicylate (ASPERCREME) 10 % cream Apply 1 Application topically as needed for muscle pain.    [provider]      Allergies    Penicillin g    Review of Systems   Review of Systems  Constitutional:  Positive for chills. Negative for appetite change and fever.  Respiratory:  Negative for shortness of breath.   Cardiovascular:  Negative for chest pain.  Gastrointestinal:  Positive for abdominal pain. Negative for diarrhea, nausea and vomiting.  Genitourinary:  Negative for dysuria and flank pain.  Musculoskeletal:  Negative for back pain.  Skin:  Positive for color change. Negative for rash.  Neurological:  Negative for weakness and numbness.    Physical Exam Updated Vital Signs BP 124/67 (BP Location: Left Arm)   Pulse (!) 112   Temp 98.5 F (36.9 C) (Oral)   Resp 19   Ht 5' 7.5" (1.715 m)   Wt (!) 137.9 kg   LMP 01/14/2024   SpO2 97%   BMI 46.91 kg/m  Physical Exam Vitals and nursing note reviewed.  Constitutional:      Appearance: Normal appearance.  Cardiovascular:     Rate and Rhythm: Regular rhythm. Tachycardia present.     Pulses: Normal pulses.  Pulmonary:     Effort: Pulmonary effort is normal.     Breath sounds: Normal breath sounds.  Abdominal:     Palpations: Abdomen is soft.     Tenderness: There is abdominal tenderness.     Comments: Approximately 6 cm area of induration right lower abdomen, near umbilicus.  Mild erythema to the area.  I do not appreciate any fluctuance or drainage.  Musculoskeletal:        General: No tenderness.     Right lower leg: No edema.     Left lower leg: No edema.  Skin:    General: Skin  is warm.     Capillary Refill: Capillary refill takes less than 2 seconds.     Findings: Erythema present.  Neurological:     General: No focal deficit present.     Mental Status: She is alert.     Motor: No weakness.     ED Results / Procedures / Treatments   Labs (all labs ordered are listed, but only abnormal results are displayed) Labs Reviewed  CBC WITH DIFFERENTIAL/PLATELET - Abnormal; Notable for the following components:      Result Value   WBC 31.6 (*)    Neutro Abs 26.1 (*)    Monocytes Absolute 2.6 (*)    Abs Immature Granulocytes  0.52 (*)    All other components within normal limits  COMPREHENSIVE METABOLIC PANEL WITH GFR - Abnormal; Notable for the following components:   Sodium 132 (*)    Potassium 3.3 (*)    Chloride 97 (*)    Glucose, Bld 102 (*)    Albumin 2.9 (*)    AST 11 (*)    All other components within normal limits  CBG MONITORING, ED - Abnormal; Notable for the following components:   Glucose-Capillary 62 (*)    All other components within normal limits  CBG MONITORING, ED - Abnormal; Notable for the following components:   Glucose-Capillary 107 (*)    All other components within normal limits  LACTIC ACID, PLASMA  LACTIC ACID, PLASMA  PREGNANCY, URINE    EKG None  Radiology CT ABDOMEN PELVIS W CONTRAST Result Date: 01/19/2024 CLINICAL DATA:  Acute non localized abdominal pain. EXAM: CT ABDOMEN AND PELVIS WITH CONTRAST TECHNIQUE: Multidetector CT imaging of the abdomen and pelvis was performed using the standard protocol following bolus administration of intravenous contrast. RADIATION DOSE REDUCTION: This exam was performed according to the departmental dose-optimization program which includes automated exposure control, adjustment of the mA and/or kV according to patient size and/or use of iterative reconstruction technique. CONTRAST:  OMNIPAQUE IOHEXOL 300 MG/ML  SOLN COMPARISON:  None Available. FINDINGS: Lower Chest: No acute findings.  Hepatobiliary: No suspicious hepatic masses identified. Gallbladder is unremarkable. No evidence of biliary ductal dilatation. Pancreas:  No mass or inflammatory changes. Spleen: Within normal limits in size and appearance. Adrenals/Urinary Tract: No suspicious masses identified. No evidence of ureteral calculi or hydronephrosis. Stomach/Bowel: No evidence of obstruction, inflammatory process or abnormal fluid collections. Normal appendix visualized. Vascular/Lymphatic: No pathologically enlarged lymph nodes. No acute vascular findings. Reproductive:  No mass or other significant abnormality. Other: Skin thickening and edema in the subcutaneous fat is seen in the lower anterior abdominal wall soft tissues. Soft tissue gas is also seen in the subcutaneous tissues, with a focal low-attenuation collection measuring 4.6 x 2.7 cm, suspicious for abscess. No evidence of ventral hernia. Musculoskeletal:  No suspicious bone lesions identified. IMPRESSION: Skin thickening,subcutaneous edema, and soft tissue gas in lower anterior abdominal wall, suspicious for gas-forming infection/cellulitis. Focal 4.6 cm low-attenuation collection in abdominal wall superficial subcutaneous tissues, suspicious for abscess. Electronically Signed   By: Danae Orleans M.D.   On: 01/19/2024 14:37    Procedures Procedures    Medications Ordered in ED Medications  vancomycin (VANCOREADY) IVPB 2000 mg/400 mL (has no administration in time range)  ondansetron (ZOFRAN) injection 4 mg (4 mg Intravenous Given 01/19/24 1007)  HYDROmorphone (DILAUDID) injection 0.5 mg (0.5 mg Intravenous Given 01/19/24 1201)  gabapentin (NEURONTIN) capsule 800 mg (800 mg Oral Given 01/19/24 1231)  LORazepam (ATIVAN) injection 1 mg (1 mg Intravenous Given 01/19/24 1231)  iohexol (OMNIPAQUE) 300 MG/ML solution 100 mL (100 mLs Intravenous Contrast Given 01/19/24 1349)    ED Course/ Medical Decision Making/ A&P                                 Medical Decision  Making Patient with history of diabetes, here for evaluation of right-sided abdominal pain.  There is tenderness to palpation with mild redness noted.  Reports having chills, no nausea vomiting  On my exam well-appearing, tachycardic without fever here.  There is induration of the right abdominal wall that appears to be tracking toward the umbilicus.  I suspect this is a developing abscess, cellulitis also considered.  Sepsis also considered, Patient will need CT imaging and labs.  I do not appreciate any obvious drainable abscess at this time.  Amount and/or Complexity of Data Reviewed Labs: ordered.    Details: Labs interpreted by me, significant leukocytosis with white count greater than 31,000.  Chemistries mild hypokalemia potassium 3.3 otherwise reassuring.  Urine pregnancy test is negative, lactic acid unremarkable. Radiology: ordered.    Details: CT of the abdomen and pelvis shows skin thickening and subcutaneous edema with soft tissue gas in the lower anterior abdominal wall suspicious for gas-forming infection/cellulitis.  There is a 4.6 cm focal collection of the abdominal wall suspicious for abscess Discussion of management or test interpretation with external provider(s): Work up today showing gas-forming infection of the abdominal wall.  Significant leukocytosis without laboratory evidence of sepsis.  No drainable abscess on my exam  Consulted with general surgery, Dr. Robyne Peers who recommends hospital admission with plan for surgery tonight, likely ICU admission, IV vancomycin clindamycin and Zosyn.  Patient does documented anaphylactic reaction to penicillin.  On further questioning, patient states that anaphylactic reaction to penicillin was her father and therefore she has just avoided penicillin.  I spoke with pharmacy here who recommended meropenem  Discussed with [facility] hospitalist, Dr. Adrian Blackwater who agrees to admit.   Risk Prescription drug management. Decision regarding  hospitalization.           Final Clinical Impression(s) / ED Diagnoses Final diagnoses:  None    Rx / DC Orders ED Discharge Orders     None         Pauline Aus, PA-C 01/19/24 1728    Bethann Berkshire, MD 01/21/24 [phone]

## 2024-01-20 ENCOUNTER — Encounter (HOSPITAL_COMMUNITY): Payer: Self-pay | Admitting: Surgery

## 2024-01-20 DIAGNOSIS — L02211 Cutaneous abscess of abdominal wall: Secondary | ICD-10-CM | POA: Diagnosis not present

## 2024-01-20 DIAGNOSIS — M7989 Other specified soft tissue disorders: Secondary | ICD-10-CM

## 2024-01-20 LAB — CBC
HCT: 35.2 % — ABNORMAL LOW (ref 36.0–46.0)
Hemoglobin: 11.5 g/dL — ABNORMAL LOW (ref 12.0–15.0)
MCH: 27.3 pg (ref 26.0–34.0)
MCHC: 32.7 g/dL (ref 30.0–36.0)
MCV: 83.4 fL (ref 80.0–100.0)
Platelets: 288 10*3/uL (ref 150–400)
RBC: 4.22 MIL/uL (ref 3.87–5.11)
RDW: 13.4 % (ref 11.5–15.5)
WBC: 23.9 10*3/uL — ABNORMAL HIGH (ref 4.0–10.5)
nRBC: 0 % (ref 0.0–0.2)

## 2024-01-20 LAB — GLUCOSE, CAPILLARY
Glucose-Capillary: 202 mg/dL — ABNORMAL HIGH (ref 70–99)
Glucose-Capillary: 146 mg/dL — ABNORMAL HIGH (ref 70–99)
Glucose-Capillary: 223 mg/dL — ABNORMAL HIGH (ref 70–99)
Glucose-Capillary: 209 mg/dL — ABNORMAL HIGH (ref 70–99)

## 2024-01-20 LAB — HEMOGLOBIN A1C
Hgb A1c MFr Bld: 8.7 % — ABNORMAL HIGH (ref 4.8–5.6)
Mean Plasma Glucose: 202.99 mg/dL

## 2024-01-20 LAB — HIV ANTIBODY (ROUTINE TESTING W REFLEX): HIV Screen 4th Generation wRfx: NONREACTIVE

## 2024-01-20 LAB — MRSA NEXT GEN BY PCR, NASAL: MRSA by PCR Next Gen: NOT DETECTED

## 2024-01-20 MED ORDER — NICOTINE 21 MG/24HR TD PT24
21.0000 mg | MEDICATED_PATCH | Freq: Every day | TRANSDERMAL | Status: DC
  Administered 2024-01-20 – 2024-01-23 (×4): 21 mg via TRANSDERMAL
  Filled 2024-01-20 (×4): qty 1

## 2024-01-20 MED ORDER — SENNOSIDES-DOCUSATE SODIUM 8.6-50 MG PO TABS
2.0000 | ORAL_TABLET | Freq: Every day | ORAL | Status: DC
  Administered 2024-01-20 – 2024-01-22 (×3): 2 via ORAL
  Filled 2024-01-20 (×3): qty 2

## 2024-01-20 MED ORDER — POLYETHYLENE GLYCOL 3350 17 G PO PACK
17.0000 g | PACK | Freq: Every day | ORAL | Status: DC
  Administered 2024-01-20 – 2024-01-23 (×4): 17 g via ORAL
  Filled 2024-01-20 (×4): qty 1

## 2024-01-20 MED ORDER — HEPARIN SODIUM (PORCINE) 5000 UNIT/ML IJ SOLN
5000.0000 [IU] | Freq: Three times a day (TID) | INTRAMUSCULAR | Status: DC
  Administered 2024-01-20 – 2024-01-23 (×9): 5000 [IU] via SUBCUTANEOUS
  Filled 2024-01-20 (×9): qty 1

## 2024-01-20 MED ORDER — FLUCONAZOLE 150 MG PO TABS
150.0000 mg | ORAL_TABLET | Freq: Once | ORAL | Status: AC
  Administered 2024-01-20: 150 mg via ORAL
  Filled 2024-01-20: qty 1

## 2024-01-20 MED ORDER — DIPHENHYDRAMINE HCL 25 MG PO CAPS
25.0000 mg | ORAL_CAPSULE | Freq: Once | ORAL | Status: AC
  Administered 2024-01-20: 25 mg via ORAL
  Filled 2024-01-20: qty 1

## 2024-01-20 NOTE — Plan of Care (Signed)

## 2024-01-20 NOTE — Progress Notes (Signed)
   01/20/24 1819  TOC Brief Assessment  Insurance and Status Reviewed  Patient has primary care physician Yes  Home environment has been reviewed Single family home  Prior level of function: Independent  Prior/Current Home Services No current home services  Readmission risk has been reviewed Yes  Transition of care needs no transition of care needs at this time   Transition of Care Department [facility]) has reviewed patient and no TOC needs have been identified at this time. We will continue to monitor patient advancement through interdisciplinary progression rounds. If new patient transition needs arise, please place a TOC consult.

## 2024-01-20 NOTE — Plan of Care (Signed)

## 2024-01-20 NOTE — Progress Notes (Signed)
 [facility] Progress Note  1 Day Post-Op  Subjective: Patient seen and examined.  She is resting comfortably in bed.  She has tolerated diet without nausea and vomiting.  She has some pains in her abdomen, but it is significantly better than the pain she was experiencing preoperatively.  She denies fevers and chills.  Objective: Vital signs in last 24 hours: Temp:  [97.6 F (36.4 C)-100.3 F (37.9 C)] 97.8 F (36.6 C) (04/05 1217) Pulse Rate:  [52-132] 52 (04/05 0204) Resp:  [5-18] 16 (04/05 0100) BP: (93-155)/(59-120) 93/78 (04/05 0204) SpO2:  [75 %-100 %] 75 % (04/05 0204) FiO2 (%):  [28 %] 28 % (04/04 2027) Weight:  [782 kg] 139 kg (04/04 2031)    Intake/Output from previous day: 04/04 0701 - 04/05 0700 In: 493.4 [I.V.:426.6; IV Piggyback:66.8] Out: -  Intake/Output this shift: Total I/O In: 1038.2 [P.O.:480; I.V.:458.2; IV Piggyback:100] Out: -   General appearance: alert, cooperative, and no distress GI: Abdomen soft, nondistended, no percussion tenderness, tenderness to palpation of around the abdominal wall debridement site/wound; no rigidity, guarding, or rebound tenderness; abdominal wound with healthy fat at the base, no active bleeding, and no evidence of tissue necrosis  Lab Results:  Recent Labs    01/19/24 0954  WBC 31.6*  HGB 13.0  HCT 39.1  PLT 294   BMET Recent Labs    01/19/24 0954  NA 132*  K 3.3*  CL 97*  CO2 25  GLUCOSE 102*  BUN 6  CREATININE 0.84  CALCIUM 9.0   PT/INR No results for input(s): "LABPROT", "INR" in the last 72 hours.  Studies/Results: CT ABDOMEN PELVIS W CONTRAST Result Date: 01/19/2024 CLINICAL DATA:  Acute non localized abdominal pain. EXAM: CT ABDOMEN AND PELVIS WITH CONTRAST TECHNIQUE: Multidetector CT imaging of the abdomen and pelvis was performed using the standard protocol following bolus administration of intravenous contrast. RADIATION DOSE REDUCTION: This exam was performed according to the  departmental dose-optimization program which includes automated exposure control, adjustment of the mA and/or kV according to patient size and/or use of iterative reconstruction technique. CONTRAST:  OMNIPAQUE IOHEXOL 300 MG/ML  SOLN COMPARISON:  None Available. FINDINGS: Lower Chest: No acute findings. Hepatobiliary: No suspicious hepatic masses identified. Gallbladder is unremarkable. No evidence of biliary ductal dilatation. Pancreas:  No mass or inflammatory changes. Spleen: Within normal limits in size and appearance. Adrenals/Urinary Tract: No suspicious masses identified. No evidence of ureteral calculi or hydronephrosis. Stomach/Bowel: No evidence of obstruction, inflammatory process or abnormal fluid collections. Normal appendix visualized. Vascular/Lymphatic: No pathologically enlarged lymph nodes. No acute vascular findings. Reproductive:  No mass or other significant abnormality. Other: Skin thickening and edema in the subcutaneous fat is seen in the lower anterior abdominal wall soft tissues. Soft tissue gas is also seen in the subcutaneous tissues, with a focal low-attenuation collection measuring 4.6 x 2.7 cm, suspicious for abscess. No evidence of ventral hernia. Musculoskeletal:  No suspicious bone lesions identified. IMPRESSION: Skin thickening,subcutaneous edema, and soft tissue gas in lower anterior abdominal wall, suspicious for gas-forming infection/cellulitis. Focal 4.6 cm low-attenuation collection in abdominal wall superficial subcutaneous tissues, suspicious for abscess. Electronically Signed   By: Danae Orleans M.D.   On: 01/19/2024 14:37    Anti-infectives: Anti-infectives (From admission, onward)    Start     Dose/Rate Route Frequency Ordered Stop   01/20/24 1200  fluconazole (DIFLUCAN) tablet 150 mg        150 mg Oral  Once 01/20/24 1045 01/20/24 1134  01/19/24 1545  meropenem (MERREM) 1 g in sodium chloride 0.9 % 100 mL IVPB        1 g 200 mL/hr over 30 Minutes  Intravenous Every 8 hours 01/19/24 1532     01/19/24 1530  clindamycin (CLEOCIN) IVPB 900 mg        900 mg 100 mL/hr over 30 Minutes Intravenous  Once 01/19/24 1523     01/19/24 1515  vancomycin (VANCOREADY) IVPB 2000 mg/400 mL        2,000 mg 200 mL/hr over 120 Minutes Intravenous  Once 01/19/24 1500 01/19/24 1710   01/19/24 1500  vancomycin (VANCOCIN) IVPB 1000 mg/200 mL premix  Status:  Discontinued        1,000 mg 200 mL/hr over 60 Minutes Intravenous  Once 01/19/24 1457 01/19/24 1459       Assessment/Plan:  Patient is a 38 year old female who was admitted s/p incision, drainage, and debridement of abdominal wall necrotizing soft tissue infection on 4/4.  - Patient has overall been doing well postoperatively.  Her pain has been controlled and she denies fevers and chills. - Gram stain and culture obtained at the time of surgery is still pending - Continue broad-spectrum antibiotics with meropenem, vancomycin, and clindamycin given necrotizing soft tissue infection present at the time of surgery - CBC added on this morning, however there was an error within the lab.  I have reordered a CBC to see what her white count has done since surgery - Dressing changed at bedside by myself, healthy fat at the base without any evidence of tissue necrosis - Discussed that patient potentially may need to return to OR for further debridement vs bedside placement of wound vac.  Will have to evaluate how the wound progresses over the next 24 to 48 hours and make a decision about further treatment  - Continue regular diet -Hep-Lock IV fluids - Glycemic control per hospitalist - Appreciate hospitalist recommendations   LOS: 1 day    Deira Shimer A Farhiya Rosten 01/20/2024  Note: Portions of this report may have been transcribed using voice recognition software. Every effort has been made to ensure accuracy; however, inadvertent computerized transcription errors may still be present.

## 2024-01-20 NOTE — Progress Notes (Signed)
 PROGRESS NOTE  Judy Nelson, is a 38 y.o. female, DOB - 1985/11/11, ZOX:[id number]  Admit date - 01/19/2024   Admitting Physician Levie Heritage, DO  Outpatient Primary MD for the patient is Lianne Moris, PA-C  LOS - 1  Chief Complaint  Patient presents with   Skin Problem      Brief Narrative:  38 y.o. female with medical history significant of diabetes, hypertension, GERD, asthma admitted on 01/19/2024 with anterior abdominal wall abscess, s/p  Incision, drainage, and debridement of abdominal wall necrotizing soft tissue infection, wound measuring 25 x 20 x 8 cm on 01/19/24.    -Assessment and Plan: 1)Anterior abdominal wall abscess/necrotizing soft tissue infection-- Incision, drainage, and debridement of abdominal wall necrotizing soft tissue infection, wound measuring 25 x 20 x 8 cm on 01/19/24 by Dr Michelle Nasuti -Antibiotics per general surgeon -- Further management per general surgery -WBC is down to 23.9 from 31.6 No fever  Or chills  -Tolerating oral intake well  2)DM2-A1c 8.7 reflecting uncontrolled DM with hyperglycemia PTA -Hold Farxiga -Continue Semglee and Use Novolog/Humalog Sliding scale insulin with Accu-Cheks/Fingersticks as ordered   3) tobacco abuse--- smoking cessation advised -Nicotine patch as ordered  4)HLD--- continue Crestor  Status is: Inpatient   Disposition: The patient is from: Home              Anticipated d/c is to: Home              Anticipated d/c date is: > 3 days              Patient currently is not medically stable to d/c. Barriers: Not Clinically Stable-   Code Status :  -  Code Status: Full Code   Family Communication:    (patient is alert, awake and coherent)  Discussed with patient's husband at bedside  DVT Prophylaxis  :   - SCDs   SCDs Start: 01/19/24 2027   Lab Results  Component Value Date   PLT 288 01/20/2024    Inpatient Medications  Scheduled Meds:  acetaminophen  1,000 mg Oral Q6H   ALPRAZolam  1 mg Oral TID    Chlorhexidine Gluconate Cloth  6 each Topical Daily   dapagliflozin propanediol  10 mg Oral QAC breakfast   insulin aspart  0-20 Units Subcutaneous Q4H   insulin glargine-yfgn  40 Units Subcutaneous QHS   nicotine  21 mg Transdermal Daily   polyethylene glycol  17 g Oral Daily   rosuvastatin  10 mg Oral Daily   senna-docusate  2 tablet Oral QHS   Continuous Infusions:  clindamycin (CLEOCIN) IV     meropenem (MERREM) IV 1 g (01/20/24 1327)   sodium chloride     PRN Meds:.HYDROmorphone (DILAUDID) injection, ondansetron **OR** ondansetron (ZOFRAN) IV, oxyCODONE   Anti-infectives (From admission, onward)    Start     Dose/Rate Route Frequency Ordered Stop   01/20/24 1200  fluconazole (DIFLUCAN) tablet 150 mg        150 mg Oral  Once 01/20/24 1045 01/20/24 1134   01/19/24 1545  meropenem (MERREM) 1 g in sodium chloride 0.9 % 100 mL IVPB        1 g 200 mL/hr over 30 Minutes Intravenous Every 8 hours 01/19/24 1532     01/19/24 1530  clindamycin (CLEOCIN) IVPB 900 mg        900 mg 100 mL/hr over 30 Minutes Intravenous  Once 01/19/24 1523     01/19/24 1515  vancomycin (VANCOREADY) IVPB 2000 mg/400 mL  2,000 mg 200 mL/hr over 120 Minutes Intravenous  Once 01/19/24 1500 01/19/24 1710   01/19/24 1500  vancomycin (VANCOCIN) IVPB 1000 mg/200 mL premix  Status:  Discontinued        1,000 mg 200 mL/hr over 60 Minutes Intravenous  Once 01/19/24 1457 01/19/24 1459         Subjective: [name] today has no fevers, no emesis,  No chest pain,   - Discussed with patient's husband at bedside -Patient reports vaginal itching she think she may have a yeast infection -Tolerating oral intake well -   Objective: Vitals:   01/20/24 0755 01/20/24 0810 01/20/24 1217 01/20/24 1415  BP: (!) 88/46   109/71  Pulse: 98   96  Resp: 16   13  Temp:  98.3 F (36.8 C) 97.8 F (36.6 C)   TempSrc:  Oral Oral   SpO2: 99%   99%  Weight:      Height:        Intake/Output Summary (Last 24  hours) at 01/20/2024 1706 Last data filed at 01/20/2024 1538 Gross per 24 hour  Intake 2144.2 ml  Output --  Net 2144.2 ml   Filed Weights   01/19/24 0839 01/19/24 2031  Weight: (!) 137.9 kg (!) 139 kg    Physical Exam  Gen:- Awake Alert,  in no apparent distress  HEENT:- [place].AT, No sclera icterus Neck-Supple Neck,No JVD,.  Lungs-  CTAB , fair symmetrical air movement CV- S1, S2 normal, regular  Abd-  +ve B.Sounds, Abd Soft, increased truncal adiposity, appropriate post operative tenderness, abdominal binder and abdominal dressing in place Extremity/Skin:- No  edema, pedal pulses present  Psych-affect is appropriate, oriented x3 Neuro-no new focal deficits, no tremors  Data Reviewed: I have personally reviewed following labs and imaging studies  CBC: Recent Labs  Lab 01/19/24 0954 01/20/24 1615  WBC 31.6* 23.9*  NEUTROABS 26.1*  --   HGB 13.0 11.5*  HCT 39.1 35.2*  MCV 82.0 83.4  PLT 294 288   Basic Metabolic Panel: Recent Labs  Lab 01/19/24 0954  NA 132*  K 3.3*  CL 97*  CO2 25  GLUCOSE 102*  BUN 6  CREATININE 0.84  CALCIUM 9.0   GFR: Estimated Creatinine Clearance: 134 mL/min (by C-G formula based on SCr of 0.84 mg/dL). Liver Function Tests: Recent Labs  Lab 01/19/24 0954  AST 11*  ALT 11  ALKPHOS 125  BILITOT 0.9  PROT 7.9  ALBUMIN 2.9*   HbA1C: Recent Labs    01/20/24 0709  HGBA1C 8.7*    Recent Results (from the past 240 hours)  MRSA Next Gen by PCR, Nasal     Status: None   Collection Time: 01/19/24  4:55 PM   Specimen: Nasal Mucosa; Nasal Swab  Result Value Ref Range Status   MRSA by PCR Next Gen NOT DETECTED NOT DETECTED Final    Comment: (NOTE) The GeneXpert MRSA Assay (FDA approved for NASAL specimens only), is one component of a comprehensive MRSA colonization surveillance program. It is not intended to diagnose MRSA infection nor to guide or monitor treatment for MRSA infections. Test performance is not FDA approved in patients  less than 5 years old. Performed at [facility], [address], [place], [place] [address]   Aerobic/Anaerobic Culture w Gram Stain (surgical/deep wound)     Status: None (Preliminary result)   Collection Time: 01/19/24  6:20 PM   Specimen: Path fluid; Body Fluid  Result Value Ref Range Status   Specimen Description  Final    FLUID Performed at [facility], [address] [place], [place] [address]    Special Requests   Final    NONE Performed at [facility], [address], [place], [place] [address]    Gram Stain   Final    MODERATE WBC SEEN ABUNDANT GRAM NEGATIVE RODS ABUNDANT GRAM POSITIVE COCCI Performed at [facility], [name], [place], [place] [address]    Culture PENDING  Incomplete   Report Status PENDING  Incomplete     Radiology Studies: CT ABDOMEN PELVIS W CONTRAST Result Date: 01/19/2024 CLINICAL DATA:  Acute non localized abdominal pain. EXAM: CT ABDOMEN AND PELVIS WITH CONTRAST TECHNIQUE: Multidetector CT imaging of the abdomen and pelvis was performed using the standard protocol following bolus administration of intravenous contrast. RADIATION DOSE REDUCTION: This exam was performed according to the departmental dose-optimization program which includes automated exposure control, adjustment of the mA and/or kV according to patient size and/or use of iterative reconstruction technique. CONTRAST:  OMNIPAQUE IOHEXOL 300 MG/ML  SOLN COMPARISON:  None Available. FINDINGS: Lower Chest: No acute findings. Hepatobiliary: No suspicious hepatic masses identified. Gallbladder is unremarkable. No evidence of biliary ductal dilatation. Pancreas:  No mass or inflammatory changes. Spleen: Within normal limits in size and appearance. Adrenals/Urinary Tract: No suspicious masses identified. No evidence of ureteral calculi or hydronephrosis. Stomach/Bowel: No evidence of obstruction, inflammatory process or abnormal fluid collections. Normal appendix visualized.  Vascular/Lymphatic: No pathologically enlarged lymph nodes. No acute vascular findings. Reproductive:  No mass or other significant abnormality. Other: Skin thickening and edema in the subcutaneous fat is seen in the lower anterior abdominal wall soft tissues. Soft tissue gas is also seen in the subcutaneous tissues, with a focal low-attenuation collection measuring 4.6 x 2.7 cm, suspicious for abscess. No evidence of ventral hernia. Musculoskeletal:  No suspicious bone lesions identified. IMPRESSION: Skin thickening,subcutaneous edema, and soft tissue gas in lower anterior abdominal wall, suspicious for gas-forming infection/cellulitis. Focal 4.6 cm low-attenuation collection in abdominal wall superficial subcutaneous tissues, suspicious for abscess. Electronically Signed   By: Danae Orleans M.D.   On: 01/19/2024 14:37   Scheduled Meds:  acetaminophen  1,000 mg Oral Q6H   ALPRAZolam  1 mg Oral TID   Chlorhexidine Gluconate Cloth  6 each Topical Daily   dapagliflozin propanediol  10 mg Oral QAC breakfast   insulin aspart  0-20 Units Subcutaneous Q4H   insulin glargine-yfgn  40 Units Subcutaneous QHS   nicotine  21 mg Transdermal Daily   polyethylene glycol  17 g Oral Daily   rosuvastatin  10 mg Oral Daily   senna-docusate  2 tablet Oral QHS   Continuous Infusions:  clindamycin (CLEOCIN) IV     meropenem (MERREM) IV 1 g (01/20/24 1327)   sodium chloride       LOS: 1 day    Shon Hale M.D on 01/20/2024 at 5:06 PM  Go to [email or web address] - for contact info  [facility] - Office  [phone]  If 7PM-7AM, please contact night-coverage [email or web address] 01/20/2024, 5:06 PM

## 2024-01-21 DIAGNOSIS — L02211 Cutaneous abscess of abdominal wall: Secondary | ICD-10-CM | POA: Diagnosis not present

## 2024-01-21 LAB — BASIC METABOLIC PANEL WITH GFR
Anion gap: 11 (ref 5–15)
BUN: 7 mg/dL (ref 6–20)
CO2: 26 mmol/L (ref 22–32)
Calcium: 8.7 mg/dL — ABNORMAL LOW (ref 8.9–10.3)
Chloride: 98 mmol/L (ref 98–111)
Creatinine, Ser: 0.68 mg/dL (ref 0.44–1.00)
GFR, Estimated: >60 mL/min (ref >60)
Glucose, Bld: 117 mg/dL — ABNORMAL HIGH (ref 70–99)
Potassium: 3.4 mmol/L — ABNORMAL LOW (ref 3.5–5.1)
Sodium: 135 mmol/L (ref 135–145)

## 2024-01-21 LAB — GLUCOSE, CAPILLARY
Glucose-Capillary: 103 mg/dL — ABNORMAL HIGH (ref 70–99)
Glucose-Capillary: 110 mg/dL — ABNORMAL HIGH (ref 70–99)
Glucose-Capillary: 118 mg/dL — ABNORMAL HIGH (ref 70–99)
Glucose-Capillary: 194 mg/dL — ABNORMAL HIGH (ref 70–99)
Glucose-Capillary: 238 mg/dL — ABNORMAL HIGH (ref 70–99)
Glucose-Capillary: 97 mg/dL (ref 70–99)

## 2024-01-21 LAB — CBC
HCT: 33.9 % — ABNORMAL LOW (ref 36.0–46.0)
Hemoglobin: 10.8 g/dL — ABNORMAL LOW (ref 12.0–15.0)
MCH: 26.7 pg (ref 26.0–34.0)
MCHC: 31.9 g/dL (ref 30.0–36.0)
MCV: 83.7 fL (ref 80.0–100.0)
Platelets: 287 10*3/uL (ref 150–400)
RBC: 4.05 MIL/uL (ref 3.87–5.11)
RDW: 13.4 % (ref 11.5–15.5)
WBC: 21.7 10*3/uL — ABNORMAL HIGH (ref 4.0–10.5)
nRBC: 0 % (ref 0.0–0.2)

## 2024-01-21 MED ORDER — CLOTRIMAZOLE 2 % VA CREA
1.0000 | TOPICAL_CREAM | Freq: Every day | VAGINAL | Status: DC
  Administered 2024-01-21 – 2024-01-22 (×2): 1 via VAGINAL
  Filled 2024-01-21: qty 21

## 2024-01-21 MED ORDER — VANCOMYCIN HCL 1750 MG/350ML IV SOLN
1750.0000 mg | Freq: Two times a day (BID) | INTRAVENOUS | Status: DC
  Administered 2024-01-22 – 2024-01-23 (×4): 1750 mg via INTRAVENOUS
  Filled 2024-01-21 (×7): qty 350

## 2024-01-21 MED ORDER — ALUM & MAG HYDROXIDE-SIMETH 200-200-20 MG/5ML PO SUSP
30.0000 mL | ORAL | Status: DC | PRN
  Administered 2024-01-21: 30 mL via ORAL
  Filled 2024-01-21: qty 30

## 2024-01-21 MED ORDER — POTASSIUM CHLORIDE CRYS ER 20 MEQ PO TBCR
40.0000 meq | EXTENDED_RELEASE_TABLET | Freq: Once | ORAL | Status: AC
  Administered 2024-01-21: 40 meq via ORAL
  Filled 2024-01-21: qty 2

## 2024-01-21 MED ORDER — MORPHINE SULFATE (PF) 2 MG/ML IV SOLN
2.0000 mg | INTRAVENOUS | Status: DC | PRN
  Administered 2024-01-22 – 2024-01-23 (×2): 2 mg via INTRAVENOUS
  Filled 2024-01-21 (×2): qty 1

## 2024-01-21 MED ORDER — VANCOMYCIN HCL 2000 MG/400ML IV SOLN
2000.0000 mg | Freq: Once | INTRAVENOUS | Status: AC
  Administered 2024-01-21: 2000 mg via INTRAVENOUS
  Filled 2024-01-21: qty 400

## 2024-01-21 NOTE — Progress Notes (Signed)
 Pharmacy Antibiotic Note  Judy Nelson is a 38 y.o. female admitted on 01/19/2024 with cellulitis.  Pharmacy has been consulted for vancomycin dosing.  Patient now s/p I&D of abdominal wall necrotizing soft tissue infection on 4/4. Patient currently afebrile, wbc elevated at 21.7. Renal function normal.  Vancomycin 2g x1 then 1750 mg IV Q 12 hrs. Goal AUC 400-550. Expected AUC: 525 SCr used: 0.68  Height: 5\' 7"  (170.2 cm) Weight: (!) 139 kg (306 lb 7 oz) IBW/kg (Calculated) : 61.6  Temp (24hrs), Avg:98.2 F (36.8 C), Min:97.8 F (36.6 C), Max:98.5 F (36.9 C)  Recent Labs  Lab 01/19/24 0954 01/19/24 1159 01/20/24 1615 01/21/24 0516  WBC 31.6*  --  23.9* 21.7*  CREATININE 0.84  --   --  0.68  LATICACIDVEN 1.7 1.2  --   --     Estimated Creatinine Clearance: 140.7 mL/min (by C-G formula based on SCr of 0.68 mg/dL).    Allergies  Allergen Reactions   Penicillin G Anaphylaxis and Swelling    Throat swelling   Thank you for allowing pharmacy to be a part of this patient's care.  Sheppard Coil PharmD., BCPS Clinical Pharmacist 01/21/2024 12:33 PM

## 2024-01-21 NOTE — Progress Notes (Signed)
 [facility] Progress Note  2 Days Post-Op  Subjective: Patient seen and examined.  She is resting comfortably in bed.  She is tolerating a diet without nausea and vomiting.  Her pain is well-controlled with oral medications.  Objective: Vital signs in last 24 hours: Temp:  [97.8 F (36.6 C)-98.5 F (36.9 C)] 98.4 F (36.9 C) (04/06 0812) Pulse Rate:  [96-105] 99 (04/06 0353) Resp:  [13-21] 16 (04/06 0353) BP: (109-139)/(71-86) 122/80 (04/06 0355) SpO2:  [99 %-100 %] 100 % (04/06 0353) Last BM Date : 01/18/24  Intake/Output from previous day: 04/05 0701 - 04/06 0700 In: 1750.8 [P.O.:840; I.V.:610.8; IV Piggyback:300] Out: -  Intake/Output this shift: No intake/output data recorded.  General appearance: alert, cooperative, and no distress GI: Abdomen soft, nondistended, no percussion tenderness, tenderness to palpation of around the abdominal wall debridement site/wound; no rigidity, guarding, or rebound tenderness; abdominal wound with healthy fat at the base, no active bleeding, and no evidence of tissue necrosis  Lab Results:  Recent Labs    01/20/24 1615 01/21/24 0516  WBC 23.9* 21.7*  HGB 11.5* 10.8*  HCT 35.2* 33.9*  PLT 288 287   BMET Recent Labs    01/19/24 0954 01/21/24 0516  NA 132* 135  K 3.3* 3.4*  CL 97* 98  CO2 25 26  GLUCOSE 102* 117*  BUN 6 7  CREATININE 0.84 0.68  CALCIUM 9.0 8.7*   PT/INR No results for input(s): "LABPROT", "INR" in the last 72 hours.  Studies/Results: CT ABDOMEN PELVIS W CONTRAST Result Date: 01/19/2024 CLINICAL DATA:  Acute non localized abdominal pain. EXAM: CT ABDOMEN AND PELVIS WITH CONTRAST TECHNIQUE: Multidetector CT imaging of the abdomen and pelvis was performed using the standard protocol following bolus administration of intravenous contrast. RADIATION DOSE REDUCTION: This exam was performed according to the departmental dose-optimization program which includes automated exposure control, adjustment  of the mA and/or kV according to patient size and/or use of iterative reconstruction technique. CONTRAST:  OMNIPAQUE IOHEXOL 300 MG/ML  SOLN COMPARISON:  None Available. FINDINGS: Lower Chest: No acute findings. Hepatobiliary: No suspicious hepatic masses identified. Gallbladder is unremarkable. No evidence of biliary ductal dilatation. Pancreas:  No mass or inflammatory changes. Spleen: Within normal limits in size and appearance. Adrenals/Urinary Tract: No suspicious masses identified. No evidence of ureteral calculi or hydronephrosis. Stomach/Bowel: No evidence of obstruction, inflammatory process or abnormal fluid collections. Normal appendix visualized. Vascular/Lymphatic: No pathologically enlarged lymph nodes. No acute vascular findings. Reproductive:  No mass or other significant abnormality. Other: Skin thickening and edema in the subcutaneous fat is seen in the lower anterior abdominal wall soft tissues. Soft tissue gas is also seen in the subcutaneous tissues, with a focal low-attenuation collection measuring 4.6 x 2.7 cm, suspicious for abscess. No evidence of ventral hernia. Musculoskeletal:  No suspicious bone lesions identified. IMPRESSION: Skin thickening,subcutaneous edema, and soft tissue gas in lower anterior abdominal wall, suspicious for gas-forming infection/cellulitis. Focal 4.6 cm low-attenuation collection in abdominal wall superficial subcutaneous tissues, suspicious for abscess. Electronically Signed   By: Danae Orleans M.D.   On: 01/19/2024 14:37    Anti-infectives: Anti-infectives (From admission, onward)    Start     Dose/Rate Route Frequency Ordered Stop   01/20/24 1200  fluconazole (DIFLUCAN) tablet 150 mg        150 mg Oral  Once 01/20/24 1045 01/20/24 1134   01/19/24 1545  meropenem (MERREM) 1 g in sodium chloride 0.9 % 100 mL IVPB  1 g 200 mL/hr over 30 Minutes Intravenous Every 8 hours 01/19/24 1532     01/19/24 1530  clindamycin (CLEOCIN) IVPB 900 mg         900 mg 100 mL/hr over 30 Minutes Intravenous  Once 01/19/24 1523     01/19/24 1515  vancomycin (VANCOREADY) IVPB 2000 mg/400 mL        2,000 mg 200 mL/hr over 120 Minutes Intravenous  Once 01/19/24 1500 01/19/24 1710   01/19/24 1500  vancomycin (VANCOCIN) IVPB 1000 mg/200 mL premix  Status:  Discontinued        1,000 mg 200 mL/hr over 60 Minutes Intravenous  Once 01/19/24 1457 01/19/24 1459       Assessment/Plan:  Patient is a 38 year old female who was admitted s/p incision, drainage, and debridement of abdominal wall necrotizing soft tissue infection on 4/4.   - Patient has overall been doing well postoperatively - Gram stain with gram-negative rods and gram-positive cocci, culture with no growth for 1 day - Continue broad-spectrum antibiotics with meropenem, vancomycin, and clindamycin given necrotizing soft tissue infection present at the time of surgery - WBC count continuing to improve, 21.7 from 23.9 yesterday - Dressing changed at bedside by myself, healthy fat at the base without any evidence of tissue necrosis - Will plan for wound VAC placement at operative site tomorrow given patient does not need further debridements at this time - Will consult social worker and wound care nurse to assist with wound VAC education and home health care to assist with wound VAC changes - Continue regular diet - Glycemic control per hospitalist - Appreciate hospitalist recommendations   LOS: 2 days    [name] A Kaniah [name] 01/21/2024  Note: Portions of this report may have been transcribed using voice recognition software. Every effort has been made to ensure accuracy; however, inadvertent computerized transcription errors may still be present.

## 2024-01-21 NOTE — Progress Notes (Addendum)
 PROGRESS NOTE  Judy Nelson, is a 38 y.o. female, DOB - 12-Apr-1986, [id number]:[id number]  Admit date - 01/19/2024   Admitting Physician Levie Heritage, DO  Outpatient Primary MD for the patient is Lianne Moris, PA-C  LOS - 2  Chief Complaint  Patient presents with   Skin Problem      Brief Narrative:  38 y.o. female with medical history significant of diabetes, hypertension, GERD, asthma admitted on 01/19/2024 with anterior abdominal wall abscess, s/p  Incision, drainage, and debridement of abdominal wall necrotizing soft tissue infection, wound measuring 25 x 20 x 8 cm on 01/19/24.    -Assessment and Plan: 1)Anterior Abdominal wall Abscess/Necrotizing soft tissue infection-- Incision, drainage, and debridement of abdominal wall necrotizing soft tissue infection, wound measuring 25 x 20 x 8 cm on 01/19/24 by Dr Michelle Nasuti -- Further management per general surgery Wound Gram stain with gram-negative rods and gram-positive cocci,  -Wound culture NGTD -WBC -31.6 >>23.9 >>21.7 No fever  Or chills  -Tolerating oral intake well -Antibiotics per General Surgeon -General Surgeon apparently plans to place wound VAC over the next day or so  2)DM2-A1c 8.7 reflecting uncontrolled DM with hyperglycemia PTA -Hold Farxiga -Continue Semglee and Use Novolog/Humalog Sliding scale insulin with Accu-Cheks/Fingersticks as ordered   3)Tobacco abuse--- smoking cessation advised -Nicotine patch as ordered  4)HLD--- continue Crestor  5) possible vaginal candidiasis--Per report from patient and symptoms -Pelvic exam deferred -Treat empirically with antifungal  6)Hypokalemia--- replace and recheck  Status is: Inpatient   Disposition: The patient is from: Home              Anticipated d/c is to: Home              Anticipated d/c date is: 2 days              Patient currently is not medically stable to d/c. Barriers: Not Clinically Stable-   Code Status :  -  Code Status: Full Code   Family  Communication:    (patient is alert, awake and coherent)  Discussed with patient's husband at bedside  DVT Prophylaxis  :   - SCDs   heparin injection 5,000 Units Start: 01/20/24 2200 SCDs Start: 01/19/24 2027   Lab Results  Component Value Date   PLT 287 01/21/2024    Inpatient Medications  Scheduled Meds:  acetaminophen  1,000 mg Oral Q6H   ALPRAZolam  1 mg Oral TID   Chlorhexidine Gluconate Cloth  6 each Topical Daily   clotrimazole  1 Applicatorful Vaginal QHS   heparin injection (subcutaneous)  5,000 Units Subcutaneous Q8H   insulin aspart  0-20 Units Subcutaneous Q4H   insulin glargine-yfgn  40 Units Subcutaneous QHS   nicotine  21 mg Transdermal Daily   polyethylene glycol  17 g Oral Daily   rosuvastatin  10 mg Oral Daily   senna-docusate  2 tablet Oral QHS   Continuous Infusions:  clindamycin (CLEOCIN) IV     meropenem (MERREM) IV 1 g (01/21/24 0609)   sodium chloride     [START ON 01/22/2024] vancomycin     vancomycin     PRN Meds:.alum & mag hydroxide-simeth, morphine injection, ondansetron **OR** ondansetron (ZOFRAN) IV, oxyCODONE   Anti-infectives (From admission, onward)    Start     Dose/Rate Route Frequency Ordered Stop   01/22/24 0200  vancomycin (VANCOREADY) IVPB 1750 mg/350 mL        1,750 mg 175 mL/hr over 120 Minutes Intravenous Every 12 hours 01/21/24 1229  01/21/24 1315  vancomycin (VANCOREADY) IVPB 2000 mg/400 mL        2,000 mg 200 mL/hr over 120 Minutes Intravenous  Once 01/21/24 1229     01/20/24 1200  fluconazole (DIFLUCAN) tablet 150 mg        150 mg Oral  Once 01/20/24 1045 01/20/24 1134   01/19/24 1545  meropenem (MERREM) 1 g in sodium chloride 0.9 % 100 mL IVPB        1 g 200 mL/hr over 30 Minutes Intravenous Every 8 hours 01/19/24 1532     01/19/24 1530  clindamycin (CLEOCIN) IVPB 900 mg        900 mg 100 mL/hr over 30 Minutes Intravenous  Once 01/19/24 1523     01/19/24 1515  vancomycin (VANCOREADY) IVPB 2000 mg/400 mL         2,000 mg 200 mL/hr over 120 Minutes Intravenous  Once 01/19/24 1500 01/19/24 1710   01/19/24 1500  vancomycin (VANCOCIN) IVPB 1000 mg/200 mL premix  Status:  Discontinued        1,000 mg 200 mL/hr over 60 Minutes Intravenous  Once 01/19/24 1457 01/19/24 1459         Subjective: Judy Nelson today has no fevers, no emesis,  No chest pain,   - -Husband and sister at bedside, questions answered  Objective: Vitals:   01/21/24 0353 01/21/24 0355 01/21/24 0812 01/21/24 1300  BP: 122/80 122/80    Pulse: 99     Resp: 16     Temp: 98.5 F (36.9 C)  98.4 F (36.9 C) 97.9 F (36.6 C)  TempSrc: Oral  Oral Oral  SpO2: 100%     Weight:      Height:        Intake/Output Summary (Last 24 hours) at 01/21/2024 1440 Last data filed at 01/21/2024 0300 Gross per 24 hour  Intake 352.66 ml  Output --  Net 352.66 ml   Filed Weights   01/19/24 0839 01/19/24 2031  Weight: (!) 137.9 kg (!) 139 kg    Physical Exam  Gen:- Awake Alert,  in no apparent distress  HEENT:- [place].AT, No sclera icterus Neck-Supple Neck,No JVD,.  Lungs-  CTAB , fair symmetrical air movement CV- S1, S2 normal, regular  Abd-  +ve B.Sounds, Abd Soft, increased truncal adiposity, appropriate post operative tenderness, abdominal binder and abdominal dressing in place Extremity/Skin:- No  edema, pedal pulses present  Psych-affect is appropriate, oriented x3 Neuro-no new focal deficits, no tremors  Data Reviewed: I have personally reviewed following labs and imaging studies  CBC: Recent Labs  Lab 01/19/24 0954 01/20/24 1615 01/21/24 0516  WBC 31.6* 23.9* 21.7*  NEUTROABS 26.1*  --   --   HGB 13.0 11.5* 10.8*  HCT 39.1 35.2* 33.9*  MCV 82.0 83.4 83.7  PLT 294 288 287   Basic Metabolic Panel: Recent Labs  Lab 01/19/24 0954 01/21/24 0516  NA 132* 135  K 3.3* 3.4*  CL 97* 98  CO2 25 26  GLUCOSE 102* 117*  BUN 6 7  CREATININE 0.84 0.68  CALCIUM 9.0 8.7*   GFR: Estimated Creatinine Clearance: 140.7  mL/min (by C-G formula based on SCr of 0.68 mg/dL). Liver Function Tests: Recent Labs  Lab 01/19/24 0954  AST 11*  ALT 11  ALKPHOS 125  BILITOT 0.9  PROT 7.9  ALBUMIN 2.9*   HbA1C: Recent Labs    01/20/24 0709  HGBA1C 8.7*    Recent Results (from the past 240 hours)  MRSA Next Gen by  PCR, Nasal     Status: None   Collection Time: 01/19/24  4:55 PM   Specimen: Nasal Mucosa; Nasal Swab  Result Value Ref Range Status   MRSA by PCR Next Gen NOT DETECTED NOT DETECTED Final    Comment: (NOTE) The GeneXpert MRSA Assay (FDA approved for NASAL specimens only), is one component of a comprehensive MRSA colonization surveillance program. It is not intended to diagnose MRSA infection nor to guide or monitor treatment for MRSA infections. Test performance is not FDA approved in patients less than 10 years old. Performed at [facility], [address], [place], [place] [address]   Aerobic/Anaerobic Culture w Gram Stain (surgical/deep wound)     Status: None (Preliminary result)   Collection Time: 01/19/24  6:20 PM   Specimen: Path fluid; Body Fluid  Result Value Ref Range Status   Specimen Description   Final    FLUID Performed at [facility], [address], [place], [place] [address]    Special Requests   Final    NONE Performed at [facility], [address], [place], [place] [address]    Gram Stain   Final    MODERATE WBC SEEN ABUNDANT GRAM NEGATIVE RODS ABUNDANT GRAM POSITIVE COCCI    Culture   Final    NO GROWTH 1 DAY Performed at [facility], [address] [place], [place] [address]    Report Status PENDING  Incomplete    Radiology Studies: No results found.  Scheduled Meds:  acetaminophen  1,000 mg Oral Q6H   ALPRAZolam  1 mg Oral TID   Chlorhexidine Gluconate Cloth  6 each Topical Daily   clotrimazole  1 Applicatorful Vaginal QHS   heparin injection (subcutaneous)  5,000 Units Subcutaneous Q8H   insulin aspart  0-20 Units Subcutaneous Q4H   insulin  glargine-yfgn  40 Units Subcutaneous QHS   nicotine  21 mg Transdermal Daily   polyethylene glycol  17 g Oral Daily   rosuvastatin  10 mg Oral Daily   senna-docusate  2 tablet Oral QHS   Continuous Infusions:  clindamycin (CLEOCIN) IV     meropenem (MERREM) IV 1 g (01/21/24 0609)   sodium chloride     [START ON 01/22/2024] vancomycin     vancomycin      LOS: 2 days  Shon Hale M.D on 01/21/2024 at 2:40 PM  Go to [email or web address] - for contact info  [facility] - Office  [phone]  If 7PM-7AM, please contact night-coverage [email or web address] 01/21/2024, 2:40 PM

## 2024-01-21 NOTE — Consult Note (Signed)
 WOC consulted Notified surgeon WOC nurse not available to assist tomorrow with placement and potentially not able to assist this week due to staffing.   Dr. Demetrius Charity aware, was concerned on need for [facility] teaching for home. Notified surgeon:  "we traditionally do not teach them about home management because every home care agency is a little different on their policies and devices they use. The [facility] would be the one to teach the patient about the machine, policies for issues after hours, changing the canisters and such because not all HH use the [employer] product"   Re consult if needed, will not follow at this time. Thanks  Orry [employer]l [employer], RN,CWOCN, CNS, CWON-AP [phone])

## 2024-01-22 DIAGNOSIS — L02211 Cutaneous abscess of abdominal wall: Secondary | ICD-10-CM | POA: Diagnosis not present

## 2024-01-22 LAB — GLUCOSE, CAPILLARY
Glucose-Capillary: 100 mg/dL — ABNORMAL HIGH (ref 70–99)
Glucose-Capillary: 126 mg/dL — ABNORMAL HIGH (ref 70–99)
Glucose-Capillary: 134 mg/dL — ABNORMAL HIGH (ref 70–99)
Glucose-Capillary: 171 mg/dL — ABNORMAL HIGH (ref 70–99)
Glucose-Capillary: 192 mg/dL — ABNORMAL HIGH (ref 70–99)
Glucose-Capillary: 204 mg/dL — ABNORMAL HIGH (ref 70–99)
Glucose-Capillary: 85 mg/dL (ref 70–99)
Glucose-Capillary: 90 mg/dL (ref 70–99)

## 2024-01-22 LAB — CBC
HCT: 33.1 % — ABNORMAL LOW (ref 36.0–46.0)
Hemoglobin: 10.4 g/dL — ABNORMAL LOW (ref 12.0–15.0)
MCH: 26.3 pg (ref 26.0–34.0)
MCHC: 31.4 g/dL (ref 30.0–36.0)
MCV: 83.8 fL (ref 80.0–100.0)
Platelets: 308 10*3/uL (ref 150–400)
RBC: 3.95 MIL/uL (ref 3.87–5.11)
RDW: 13.3 % (ref 11.5–15.5)
WBC: 15.9 10*3/uL — ABNORMAL HIGH (ref 4.0–10.5)
nRBC: 0 % (ref 0.0–0.2)

## 2024-01-22 LAB — RENAL FUNCTION PANEL
Albumin: 2.3 g/dL — ABNORMAL LOW (ref 3.5–5.0)
Anion gap: 10 (ref 5–15)
BUN: 5 mg/dL — ABNORMAL LOW (ref 6–20)
CO2: 26 mmol/L (ref 22–32)
Calcium: 8.5 mg/dL — ABNORMAL LOW (ref 8.9–10.3)
Chloride: 102 mmol/L (ref 98–111)
Creatinine, Ser: 0.64 mg/dL (ref 0.44–1.00)
GFR, Estimated: >60 mL/min (ref >60)
Glucose, Bld: 107 mg/dL — ABNORMAL HIGH (ref 70–99)
Phosphorus: 3.6 mg/dL (ref 2.5–4.6)
Potassium: 3.7 mmol/L (ref 3.5–5.1)
Sodium: 138 mmol/L (ref 135–145)

## 2024-01-22 MED ORDER — FESOTERODINE FUMARATE ER 4 MG PO TB24
4.0000 mg | ORAL_TABLET | Freq: Every day | ORAL | Status: DC
  Administered 2024-01-22 – 2024-01-23 (×2): 4 mg via ORAL
  Filled 2024-01-22 (×2): qty 1

## 2024-01-22 MED ORDER — GABAPENTIN 400 MG PO CAPS
800.0000 mg | ORAL_CAPSULE | Freq: Two times a day (BID) | ORAL | Status: DC
  Administered 2024-01-22 – 2024-01-23 (×2): 800 mg via ORAL
  Filled 2024-01-22 (×2): qty 2

## 2024-01-22 MED ORDER — HYDROXYZINE HCL 25 MG PO TABS
100.0000 mg | ORAL_TABLET | Freq: Every day | ORAL | Status: DC
  Administered 2024-01-22: 100 mg via ORAL
  Filled 2024-01-22: qty 4

## 2024-01-22 MED ORDER — DULOXETINE HCL 60 MG PO CPEP
60.0000 mg | ORAL_CAPSULE | Freq: Two times a day (BID) | ORAL | Status: DC
Start: 2024-01-22 — End: 2024-01-23
  Administered 2024-01-22 – 2024-01-23 (×2): 60 mg via ORAL
  Filled 2024-01-22 (×2): qty 1

## 2024-01-22 NOTE — Plan of Care (Signed)
  Problem: Health Behavior/Discharge Planning: Goal: Ability to manage health-related needs will improve Outcome: Progressing   Problem: Clinical Measurements: Goal: Ability to maintain clinical measurements within normal limits will improve Outcome: Progressing Goal: Will remain free from infection Outcome: Progressing Goal: Diagnostic test results will improve Outcome: Progressing   Problem: Activity: Goal: Risk for activity intolerance will decrease Outcome: Progressing   Problem: Nutrition: Goal: Adequate nutrition will be maintained Outcome: Progressing   Problem: Coping: Goal: Level of anxiety will decrease Outcome: Progressing   Problem: Pain Managment: Goal: General experience of comfort will improve and/or be controlled Outcome: Progressing   Problem: Safety: Goal: Ability to remain free from injury will improve Outcome: Progressing   Problem: Education: Goal: Ability to describe self-care measures that may prevent or decrease complications (Diabetes Survival Skills Education) will improve Outcome: Progressing   Problem: Coping: Goal: Ability to adjust to condition or change in health will improve Outcome: Progressing   Problem: Health Behavior/Discharge Planning: Goal: Ability to identify and utilize available resources and services will improve Outcome: Progressing Goal: Ability to manage health-related needs will improve Outcome: Progressing   Problem: Metabolic: Goal: Ability to maintain appropriate glucose levels will improve Outcome: Progressing   Problem: Metabolic: Goal: Ability to maintain appropriate glucose levels will improve Outcome: Progressing   Problem: Tissue Perfusion: Goal: Adequacy of tissue perfusion will improve Outcome: Progressing

## 2024-01-22 NOTE — Progress Notes (Signed)
 PROGRESS NOTE  Judy Nelson, is a 38 y.o. female, DOB - 10-Sep-1986, UUV:[id number]  Admit date - 01/19/2024   Admitting Physician No admitting provider for patient encounter.  Outpatient Primary MD for the patient is Lianne Moris, PA-C  LOS - 3  Chief Complaint  Patient presents with   Skin Problem      Brief Narrative:  38 y.o. female with medical history significant of diabetes, hypertension, GERD, asthma admitted on 01/19/2024 with anterior abdominal wall abscess, s/p  Incision, drainage, and debridement of abdominal wall necrotizing soft tissue infection, wound measuring 25 x 20 x 8 cm on 01/19/24.    -Assessment and Plan: 1)Anterior Abdominal wall Abscess/Necrotizing soft tissue infection-- Incision, drainage, and debridement of abdominal wall necrotizing soft tissue infection, wound measuring 25 x 20 x 8 cm on 01/19/24 by Dr Michelle Nasuti -- Further management per general surgery Wound Gram stain with gram-negative rods and gram-positive cocci,  -Wound culture NGTD -WBC -31.6 >>23.9 >>21.7>>15.9 No fever  Or chills  -Tolerating oral intake well -Antibiotics per General Surgeon- -wound VAC care per general surgeon  2)DM2-A1c 8.7 reflecting uncontrolled DM with hyperglycemia PTA -Hold Farxiga -Continue Semglee  Use Novolog/Humalog Sliding scale insulin with Accu-Cheks/Fingersticks as ordered   3)Tobacco abuse--- smoking cessation advised -Nicotine patch as ordered  4)HLD--- continue Crestor  5) possible vaginal candidiasis--Per report from patient and symptoms -Pelvic exam deferred -Treat empirically with antifungal  6)Hypokalemia--- normalized after replacement Status is: Inpatient   Disposition: The patient is from: Home              Anticipated d/c is to: Home              Anticipated d/c date is: 2 days              Patient currently is not medically stable to d/c. Barriers: Not Clinically Stable-   Code Status :  -  Code Status: Full Code   Family Communication:     (patient is alert, awake and coherent)  Discussed with patient's husband at bedside  DVT Prophylaxis  :   - SCDs   heparin injection 5,000 Units Start: 01/20/24 2200 SCDs Start: 01/19/24 2027   Lab Results  Component Value Date   PLT 308 01/22/2024    Inpatient Medications  Scheduled Meds:  acetaminophen  1,000 mg Oral Q6H   ALPRAZolam  1 mg Oral TID   Chlorhexidine Gluconate Cloth  6 each Topical Daily   clotrimazole  1 Applicatorful Vaginal QHS   heparin injection (subcutaneous)  5,000 Units Subcutaneous Q8H   insulin aspart  0-20 Units Subcutaneous Q4H   insulin glargine-yfgn  40 Units Subcutaneous QHS   nicotine  21 mg Transdermal Daily   polyethylene glycol  17 g Oral Daily   rosuvastatin  10 mg Oral Daily   senna-docusate  2 tablet Oral QHS   Continuous Infusions:  meropenem (MERREM) IV 1 g (01/22/24 0616)   sodium chloride     vancomycin 1,750 mg (01/22/24 0227)   PRN Meds:.alum & mag hydroxide-simeth, morphine injection, ondansetron **OR** ondansetron (ZOFRAN) IV, oxyCODONE   Anti-infectives (From admission, onward)    Start     Dose/Rate Route Frequency Ordered Stop   01/22/24 0200  vancomycin (VANCOREADY) IVPB 1750 mg/350 mL        1,750 mg 175 mL/hr over 120 Minutes Intravenous Every 12 hours 01/21/24 1229     01/21/24 1315  vancomycin (VANCOREADY) IVPB 2000 mg/400 mL        2,000  mg 200 mL/hr over 120 Minutes Intravenous  Once 01/21/24 1229 01/21/24 1813   01/20/24 1200  fluconazole (DIFLUCAN) tablet 150 mg        150 mg Oral  Once 01/20/24 1045 01/20/24 1134   01/19/24 1545  meropenem (MERREM) 1 g in sodium chloride 0.9 % 100 mL IVPB        1 g 200 mL/hr over 30 Minutes Intravenous Every 8 hours 01/19/24 1532     01/19/24 1530  clindamycin (CLEOCIN) IVPB 900 mg  Status:  Discontinued        900 mg 100 mL/hr over 30 Minutes Intravenous  Once 01/19/24 1523 01/22/24 0942   01/19/24 1515  vancomycin (VANCOREADY) IVPB 2000 mg/400 mL        2,000 mg 200  mL/hr over 120 Minutes Intravenous  Once 01/19/24 1500 01/19/24 1710   01/19/24 1500  vancomycin (VANCOCIN) IVPB 1000 mg/200 mL premix  Status:  Discontinued        1,000 mg 200 mL/hr over 60 Minutes Intravenous  Once 01/19/24 1457 01/19/24 1459         Subjective: Judy Nelson today has no fevers, no emesis,  No chest pain,   - -Resting comfortably, sitting in the chair Had BM  Objective: Vitals:   01/21/24 1644 01/21/24 2044 01/22/24 0356 01/22/24 1442  BP: 111/79 101/70 115/61 94/70  Pulse: 91 100 73 84  Resp: 19 20 20 18   Temp: 98 F (36.7 C) (!) 97.5 F (36.4 C) 97.6 F (36.4 C) 97.8 F (36.6 C)  TempSrc: Oral Oral Oral Oral  SpO2: 100% 100% 97% 99%  Weight:      Height:        Intake/Output Summary (Last 24 hours) at 01/22/2024 1454 Last data filed at 01/21/2024 1700 Gross per 24 hour  Intake 240 ml  Output --  Net 240 ml   Filed Weights   01/19/24 0839 01/19/24 2031  Weight: (!) 137.9 kg (!) 139 kg    Physical Exam  Gen:- Awake Alert,  in no apparent distress  HEENT:- [place].AT, No sclera icterus Neck-Supple Neck,No JVD,.  Lungs-  CTAB , fair symmetrical air movement CV- S1, S2 normal, regular  Abd-  +ve B.Sounds, Abd Soft, increased truncal adiposity, appropriate post operative tenderness,  abdominal dressing in place Extremity/Skin:- No  edema, pedal pulses present  Psych-affect is appropriate, oriented x3 Neuro-no new focal deficits, no tremors  Data Reviewed: I have personally reviewed following labs and imaging studies  CBC: Recent Labs  Lab 01/19/24 0954 01/20/24 1615 01/21/24 0516 01/22/24 0451  WBC 31.6* 23.9* 21.7* 15.9*  NEUTROABS 26.1*  --   --   --   HGB 13.0 11.5* 10.8* 10.4*  HCT 39.1 35.2* 33.9* 33.1*  MCV 82.0 83.4 83.7 83.8  PLT 294 288 287 308   Basic Metabolic Panel: Recent Labs  Lab 01/19/24 0954 01/21/24 0516 01/22/24 0451  NA 132* 135 138  K 3.3* 3.4* 3.7  CL 97* 98 102  CO2 25 26 26   GLUCOSE 102* 117* 107*  BUN  6 7 5*  CREATININE 0.84 0.68 0.64  CALCIUM 9.0 8.7* 8.5*  PHOS  --   --  3.6   GFR: Estimated Creatinine Clearance: 140.7 mL/min (by C-G formula based on SCr of 0.64 mg/dL). Liver Function Tests: Recent Labs  Lab 01/19/24 0954 01/22/24 0451  AST 11*  --   ALT 11  --   ALKPHOS 125  --   BILITOT 0.9  --  PROT 7.9  --   ALBUMIN 2.9* 2.3*   HbA1C: Recent Labs    01/20/24 0709  HGBA1C 8.7*    Recent Results (from the past 240 hours)  MRSA Next Gen by PCR, Nasal     Status: None   Collection Time: 01/19/24  4:55 PM   Specimen: Nasal Mucosa; Nasal Swab  Result Value Ref Range Status   MRSA by PCR Next Gen NOT DETECTED NOT DETECTED Final    Comment: (NOTE) The GeneXpert MRSA Assay (FDA approved for NASAL specimens only), is one component of a comprehensive MRSA colonization surveillance program. It is not intended to diagnose MRSA infection nor to guide or monitor treatment for MRSA infections. Test performance is not FDA approved in patients less than 54 years old. Performed at [facility], [address] [place], [place] [address]   Aerobic/Anaerobic Culture w Gram Stain (surgical/deep wound)     Status: None (Preliminary result)   Collection Time: 01/19/24  6:20 PM   Specimen: Path fluid; Body Fluid  Result Value Ref Range Status   Specimen Description   Final    FLUID Performed at [facility], [name], [place], [place] [address]    Special Requests   Final    NONE Performed at [facility], [address] [place], [place] [address]    Gram Stain   Final    MODERATE WBC SEEN ABUNDANT GRAM NEGATIVE RODS ABUNDANT GRAM POSITIVE COCCI    Culture   Final    CULTURE REINCUBATED FOR BETTER GROWTH Performed at [facility], [address] [place], [place] [address]    Report Status PENDING  Incomplete    Radiology Studies: No results found.  Scheduled Meds:  acetaminophen  1,000 mg Oral Q6H   ALPRAZolam  1 mg Oral TID   Chlorhexidine Gluconate  Cloth  6 each Topical Daily   clotrimazole  1 Applicatorful Vaginal QHS   heparin injection (subcutaneous)  5,000 Units Subcutaneous Q8H   insulin aspart  0-20 Units Subcutaneous Q4H   insulin glargine-yfgn  40 Units Subcutaneous QHS   nicotine  21 mg Transdermal Daily   polyethylene glycol  17 g Oral Daily   rosuvastatin  10 mg Oral Daily   senna-docusate  2 tablet Oral QHS   Continuous Infusions:  meropenem (MERREM) IV 1 g (01/22/24 0616)   sodium chloride     vancomycin 1,750 mg (01/22/24 0227)    LOS: 3 days  Shon Hale M.D on 01/22/2024 at 2:54 PM  Go to [email or web address] - for contact info  [facility] - Office  [phone]  If 7PM-7AM, please contact night-coverage [email or web address] 01/22/2024, 2:54 PM

## 2024-01-22 NOTE — Progress Notes (Signed)
 [facility] Progress Note  3 Days Post-Op  Subjective: Patient seen and examined.  She is resting comfortably in bed.  She is asking when she can leave the house spittle.  She has no specific complaints related to her abdominal wall wound.  Objective: Vital signs in last 24 hours: Temp:  [97.5 F (36.4 C)-98 F (36.7 C)] 97.6 F (36.4 C) (04/07 0356) Pulse Rate:  [73-100] 73 (04/07 0356) Resp:  [19-20] 20 (04/07 0356) BP: (101-115)/(61-79) 115/61 (04/07 0356) SpO2:  [97 %-100 %] 97 % (04/07 0356) Last BM Date : 01/21/24  Intake/Output from previous day: 04/06 0701 - 04/07 0700 In: 240 [P.O.:240] Out: -  Intake/Output this shift: No intake/output data recorded.  General appearance: alert, cooperative, and no distress GI: Abdomen soft, nondistended, no percussion tenderness, tenderness to palpation of around the abdominal wall debridement site/wound; no rigidity, guarding, or rebound tenderness; abdominal wound with healthy fat at the base, no active bleeding, and no evidence of tissue necrosis  Lab Results:  Recent Labs    01/21/24 0516 01/22/24 0451  WBC 21.7* 15.9*  HGB 10.8* 10.4*  HCT 33.9* 33.1*  PLT 287 308   BMET Recent Labs    01/21/24 0516 01/22/24 0451  NA 135 138  K 3.4* 3.7  CL 98 102  CO2 26 26  GLUCOSE 117* 107*  BUN 7 5*  CREATININE 0.68 0.64  CALCIUM 8.7* 8.5*   PT/INR No results for input(s): "LABPROT", "INR" in the last 72 hours.  Studies/Results: No results found.  Anti-infectives: Anti-infectives (From admission, onward)    Start     Dose/Rate Route Frequency Ordered Stop   01/22/24 0200  vancomycin (VANCOREADY) IVPB 1750 mg/350 mL        1,750 mg 175 mL/hr over 120 Minutes Intravenous Every 12 hours 01/21/24 1229     01/21/24 1315  vancomycin (VANCOREADY) IVPB 2000 mg/400 mL        2,000 mg 200 mL/hr over 120 Minutes Intravenous  Once 01/21/24 1229 01/21/24 1813   01/20/24 1200  fluconazole (DIFLUCAN) tablet  150 mg        150 mg Oral  Once 01/20/24 1045 01/20/24 1134   01/19/24 1545  meropenem (MERREM) 1 g in sodium chloride 0.9 % 100 mL IVPB        1 g 200 mL/hr over 30 Minutes Intravenous Every 8 hours 01/19/24 1532     01/19/24 1530  clindamycin (CLEOCIN) IVPB 900 mg  Status:  Discontinued        900 mg 100 mL/hr over 30 Minutes Intravenous  Once 01/19/24 1523 01/22/24 0942   01/19/24 1515  vancomycin (VANCOREADY) IVPB 2000 mg/400 mL        2,000 mg 200 mL/hr over 120 Minutes Intravenous  Once 01/19/24 1500 01/19/24 1710   01/19/24 1500  vancomycin (VANCOCIN) IVPB 1000 mg/200 mL premix  Status:  Discontinued        1,000 mg 200 mL/hr over 60 Minutes Intravenous  Once 01/19/24 1457 01/19/24 1459       Assessment/Plan:  Patient is a 38 year old female who was admitted s/p incision, drainage, and debridement of abdominal wall necrotizing soft tissue infection on 4/4.   - Patient has overall been doing well postoperatively - Gram stain with gram-negative rods and gram-positive cocci, culture reincubated - Continue broad-spectrum antibiotics with meropenem, vancomycin, and clindamycin given necrotizing soft tissue infection present at the time of surgery - WBC count continues to improve, 15.9 from 21.7 -  Wound VAC placed at bedside today, as wound appeared viable without any evidence of tissue necrosis.  Will recommend wound VAC change every 3 days - Will consult social worker for help with set up of home health care to assist with wound VAC changes - Continue regular diet - Glycemic control per hospitalist - Appreciate hospitalist recommendations - Anticipate the patient will be stable for discharge in the next 24 to 48 hours    LOS: 3 days    Lillianah Swartzentruber A Jaxxson Cavanah 01/22/2024  Note: Portions of this report may have been transcribed using voice recognition software. Every effort has been made to ensure accuracy; however, inadvertent computerized transcription errors may still be  present.

## 2024-01-22 NOTE — Plan of Care (Signed)
  Problem: Health Behavior/Discharge Planning: Goal: Ability to manage health-related needs will improve Outcome: Progressing   Problem: Clinical Measurements: Goal: Ability to maintain clinical measurements within normal limits will improve Outcome: Progressing Goal: Will remain free from infection Outcome: Progressing Goal: Diagnostic test results will improve Outcome: Progressing   Problem: Activity: Goal: Risk for activity intolerance will decrease Outcome: Progressing   Problem: Nutrition: Goal: Adequate nutrition will be maintained Outcome: Progressing   Problem: Coping: Goal: Level of anxiety will decrease Outcome: Progressing   Problem: Pain Managment: Goal: General experience of comfort will improve and/or be controlled Outcome: Progressing   Problem: Safety: Goal: Ability to remain free from injury will improve Outcome: Progressing   Problem: Education: Goal: Ability to describe self-care measures that may prevent or decrease complications (Diabetes Survival Skills Education) will improve Outcome: Progressing   Problem: Coping: Goal: Ability to adjust to condition or change in health will improve Outcome: Progressing   Problem: Health Behavior/Discharge Planning: Goal: Ability to identify and utilize available resources and services will improve Outcome: Progressing Goal: Ability to manage health-related needs will improve Outcome: Progressing   Problem: Metabolic: Goal: Ability to maintain appropriate glucose levels will improve Outcome: Progressing   Problem: Tissue Perfusion: Goal: Adequacy of tissue perfusion will improve Outcome: Progressing

## 2024-01-23 ENCOUNTER — Encounter (HOSPITAL_COMMUNITY): Payer: Self-pay

## 2024-01-23 ENCOUNTER — Encounter: Payer: Self-pay | Admitting: *Deleted

## 2024-01-23 DIAGNOSIS — L02211 Cutaneous abscess of abdominal wall: Secondary | ICD-10-CM | POA: Diagnosis not present

## 2024-01-23 LAB — CBC
HCT: 33.4 % — ABNORMAL LOW (ref 36.0–46.0)
Hemoglobin: 10.2 g/dL — ABNORMAL LOW (ref 12.0–15.0)
MCH: 26 pg (ref 26.0–34.0)
MCHC: 30.5 g/dL (ref 30.0–36.0)
MCV: 85 fL (ref 80.0–100.0)
Platelets: 345 10*3/uL (ref 150–400)
RBC: 3.93 MIL/uL (ref 3.87–5.11)
RDW: 13.7 % (ref 11.5–15.5)
WBC: 14.9 10*3/uL — ABNORMAL HIGH (ref 4.0–10.5)
nRBC: 0 % (ref 0.0–0.2)

## 2024-01-23 LAB — GLUCOSE, CAPILLARY
Glucose-Capillary: 169 mg/dL — ABNORMAL HIGH (ref 70–99)
Glucose-Capillary: 177 mg/dL — ABNORMAL HIGH (ref 70–99)
Glucose-Capillary: 236 mg/dL — ABNORMAL HIGH (ref 70–99)
Glucose-Capillary: 73 mg/dL (ref 70–99)

## 2024-01-23 LAB — SURGICAL PATHOLOGY

## 2024-01-23 MED ORDER — POLYETHYLENE GLYCOL 3350 17 G PO PACK: 17.0000 g | PACK | Freq: Every day | ORAL | 0 refills | Status: DC

## 2024-01-23 MED ORDER — ONDANSETRON HCL 4 MG PO TABS: 4.0000 mg | ORAL_TABLET | Freq: Every day | ORAL | 1 refills | Status: AC | PRN

## 2024-01-23 MED ORDER — MELATONIN 3 MG PO TABS
6.0000 mg | ORAL_TABLET | Freq: Every evening | ORAL | Status: DC | PRN
Start: 2024-01-23 — End: 2024-01-23

## 2024-01-23 MED ORDER — AMOXICILLIN-POT CLAVULANATE 500-125 MG PO TABS
1.0000 | ORAL_TABLET | Freq: Three times a day (TID) | ORAL | 0 refills | Status: AC
Start: 2024-01-23 — End: 2024-02-03

## 2024-01-23 MED ORDER — SENNOSIDES-DOCUSATE SODIUM 8.6-50 MG PO TABS: 2.0000 | ORAL_TABLET | Freq: Every day | ORAL | 0 refills | Status: AC

## 2024-01-23 MED ORDER — DOXYCYCLINE HYCLATE 100 MG PO CAPS
100.0000 mg | ORAL_CAPSULE | Freq: Two times a day (BID) | ORAL | 0 refills | Status: AC
Start: 2024-01-23 — End: 2024-02-03

## 2024-01-23 NOTE — TOC Transition Note (Signed)
 Transition of Care [facility]) - Discharge Note   Patient Details  Name: Judy Nelson MRN: [id number] Date of Birth: November 22, 1985  Transition of Care [facility]) CM/SW Contact:  Karn Cassis, LCSW Phone Number: 01/23/2024, 1:05 PM   Clinical Narrative:  Pt d/c today. Wound vac ordered through [facility] with [employer]. Orders signed and returned to [place]. Wound vac to be delivered to hospital this afternoon and RN will place. HHRN set up with [facility]. Per Velna Hatchet with [facility], RN can see pt Thursday. MD aware and agreeable. HHRN order in. Pt updated on above and agreeable.      Final next level of care: Home w Home Health Services Barriers to Discharge: Barriers Resolved   Patient Goals and CMS Choice Patient states their goals for this hospitalization and ongoing recovery are:: return home   Choice offered to / list presented to : Patient  [place] ownership interest in [facility].provided to::  (n/a)    Discharge Placement                    Patient and family notified of of transfer: 01/23/24  Discharge Plan and Services Additional resources added to the After Visit Summary for                  DME Arranged: Vac DME Agency: [employer] Date DME Agency Contacted: 01/23/24 Time DME Agency Contacted: 1304 Representative spoke with at DME Agency: Dione Booze Arranged: RN [facility] Agency: [facility] Home Health Date [facility] Agency Contacted: 01/23/24 Time HH Agency Contacted: 1305 Representative spoke with at [facility] Agency: Velna Hatchet  Social Drivers of Health (SDOH) Interventions SDOH Screenings   Food Insecurity: No Food Insecurity (01/19/2024)  Housing: Low Risk  (01/19/2024)  Transportation Needs: No Transportation Needs (01/19/2024)  Utilities: Not At Risk (01/19/2024)  Depression (PHQ2-9): Low Risk  (11/09/2018)  Tobacco Use: High Risk (01/19/2024)     Readmission Risk Interventions     No data to display

## 2024-01-23 NOTE — Discharge Summary (Signed)
 Physician Discharge Summary  Patient ID: Judy Nelson MRN: [id number] DOB/AGE: 38/22/1987 37 y.o.  Admit date: 01/19/2024 Discharge date: 01/23/2024  Admission Diagnoses: Necrotizing soft tissue infection, abdominal wall abscess  Discharge Diagnoses:  Principal Problem:   Abdominal wall abscess Active Problems:   Type 2 diabetes mellitus without complication (HCC)   Morbid obesity with BMI of 50.0-59.9, adult (HCC)   Necrotizing soft tissue infection   Discharged Condition: stable  Hospital Course: Patient is a 38 year old female who was admitted for concern for an abdominal wall abscess and necrotizing soft tissue infection.  She underwent an abdominal wall incision, drainage, and debridement on 4/4 for an abdominal wall necrotizing soft tissue infection.  Postoperatively, she was able to tolerate a diet without issue and her pain was controlled with oral medications.  Her wound was inspected throughout the weekend, and no further debridement was necessary.  Given the size of the wound, decision was made to convert the patient from wet-to-dry dressings to a wound VAC yesterday.  Patient tolerated wound VAC placement without any difficulty.  Social worker has set up home nursing to help with every 3-day wound VAC changes.  The home nurse will change the wound VAC this coming Thursday.  Her cultures have not fully resulted, but Gram stain did demonstrate gram-positive cocci and gram-negative rods.  I discussed antibiotic coverage with pharmacist, who recommended Augmentin and doxycycline at the time of discharge.  Will plan for patient to receive a full 14 days of antibiotic treatment.  She is stable for discharge home at this time.  I have sent in prescriptions for Zofran, Augmentin, and doxycycline.  She will follow-up with me in 2 weeks for wound evaluation.  Consults:  Hospitalist  Significant Diagnostic Studies: labs: Trended CBC, which is down trended from 31.7 to 14.9 and radiology: CT  scan: Demonstrated concern for necrotizing soft tissue infection of the abdominal wall with an associated abscess  Treatments: IV hydration, antibiotics: vancomycin and meropenem, analgesia: acetaminophen, Morphine, and oxycodone, and surgery: Incision, drainage, and debridement of abdominal wall necrotizing soft tissue infection abdominal wall abscess on 4/4  Discharge Exam: Blood pressure 114/66, pulse 88, temperature 97.7 F (36.5 C), temperature source Oral, resp. rate 18, height 5\' 7"  (1.702 m), weight (!) 139 kg, last menstrual period 01/14/2024, SpO2 98%. General appearance: alert, cooperative, and no distress GI: Abdomen soft, nondistended, no percussion tenderness, mild tenderness to palpation around abdominal wound; no rigidity, guarding, or rebound tenderness; wound VAC in place with suction noted, minimal drainage in canister  Disposition: Discharge disposition: 01-Home or Self Care       Discharge Instructions     Call MD for:  persistant nausea and vomiting   Complete by: As directed    Call MD for:  redness, tenderness, or signs of infection (pain, swelling, redness, odor or green/yellow discharge around incision site)   Complete by: As directed    Call MD for:  severe uncontrolled pain   Complete by: As directed    Call MD for:  temperature >100.4   Complete by: As directed    Diet - low sodium heart healthy   Complete by: As directed    Face-to-face encounter (required for Medicare/Medicaid patients)   Complete by: As directed    I Matteus Mcnelly A Apoorva Bugay certify that this patient is under my care and that I, or a nurse practitioner or physician's assistant working with me, had a face-to-face encounter that meets the physician face-to-face encounter requirements with this patient on 01/23/2024.  The encounter with the patient was in whole, or in part for the following medical condition(s) which is the primary reason for home health care (List medical condition):  necrotizing soft tissue infection with large, complex wound to the abdomen requiring skilled nursing for dressing changes   The encounter with the patient was in whole, or in part, for the following medical condition, which is the primary reason for home health care: necrotizing soft tissue infection with large, complex wound to the abdomen requiring skilled nursing for dressing changes   I certify that, based on my findings, the following services are medically necessary home health services: Nursing   Reason for Medically Necessary Home Health Services: Skilled Nursing- Complex Wound Care   My clinical findings support the need for the above services: OTHER SEE COMMENTS   Further, I certify that my clinical findings support that this patient is homebound due to: Open/draining pressure/stasis ulcer   Home Health   Complete by: As directed    To provide the following care/treatments: RN   Increase activity slowly   Complete by: As directed       Allergies as of 01/23/2024   No Active Allergies      Medication List     TAKE these medications    acetaminophen 500 MG tablet Commonly known as: TYLENOL Take 1 tablet (500 mg total) by mouth every 6 (six) hours as needed.   albuterol 108 (90 Base) MCG/ACT inhaler Commonly known as: VENTOLIN HFA Inhale 2 puffs into the lungs every 6 (six) hours as needed for wheezing or shortness of breath.   ALPRAZolam 1 MG tablet Commonly known as: XANAX TAKE 1 TABLET BY MOUTH THREE TIMES DAILY   amoxicillin-clavulanate 500-125 MG tablet Commonly known as: Augmentin Take 1 tablet by mouth 3 (three) times daily for 11 days.   amphetamine-dextroamphetamine 30 MG tablet Commonly known as: ADDERALL Take 15 mg by mouth daily as needed (focusing).   azithromycin 250 MG tablet Commonly known as: ZITHROMAX Take 250 mg by mouth as directed. Dose pack   clindamycin 1 % gel Commonly known as: CLINDAGEL Apply topically 2 (two) times daily.    dapagliflozin propanediol 10 MG Tabs tablet Commonly known as: Farxiga Take 1 tablet (10 mg total) by mouth daily before breakfast.   Dexcom G7 Receiver Devi Use to monitor BG continuously   Dexcom G7 Sensor Misc CHANGE SENSOR EVERY 10 DAYS   doxycycline 100 MG capsule Commonly known as: VIBRAMYCIN Take 1 capsule (100 mg total) by mouth 2 (two) times daily for 11 days.   DULoxetine 60 MG capsule Commonly known as: CYMBALTA Take 60 mg by mouth 2 (two) times daily.   fluticasone 50 MCG/ACT nasal spray Commonly known as: FLONASE Place 2 sprays into both nostrils daily.   gabapentin 800 MG tablet Commonly known as: NEURONTIN Take 800 mg by mouth 2 (two) times daily.   [employer] Diabetics Dry Skin Crea Apply 1 Application topically daily as needed (pain). Uses with aspercreme   hydrOXYzine 50 MG tablet Commonly known as: ATARAX Take 100 mg by mouth at bedtime.   ibuprofen 800 MG tablet Commonly known as: ADVIL Take 1 tablet (800 mg total) by mouth every 8 (eight) hours as needed.   insulin glargine 100 UNIT/ML injection Commonly known as: LANTUS Inject 80 Units into the skin at bedtime.   losartan 25 MG tablet Commonly known as: COZAAR Take 25 mg by mouth daily.   multivitamin tablet Take 1 tablet by mouth daily.  NovoLOG FlexPen 100 UNIT/ML FlexPen Generic drug: insulin aspart Inject 10-16 Units into the skin 3 (three) times daily before meals.   ondansetron 4 MG tablet Commonly known as: Zofran Take 1 tablet (4 mg total) by mouth daily as needed for nausea or vomiting.   ondansetron 8 MG disintegrating tablet Commonly known as: ZOFRAN-ODT Take 8 mg by mouth every 8 (eight) hours as needed for nausea or vomiting.   oxyCODONE-acetaminophen 10-325 MG tablet Commonly known as: PERCOCET Take 1 tablet by mouth every 4 (four) hours as needed for pain.   Ozempic (0.25 or 0.5 MG/DOSE) 2 MG/3ML Sopn Generic drug: Semaglutide(0.25 or 0.5MG /DOS) Inject 0.5 mg  into the skin once a week.   polyethylene glycol 17 g packet Commonly known as: MIRALAX / GLYCOLAX Take 17 g by mouth daily. Start taking on: January 24, 2024   rosuvastatin 10 MG tablet Commonly known as: Crestor Take 1 tablet (10 mg total) by mouth daily. What changed:  how much to take when to take this   senna-docusate 8.6-50 MG tablet Commonly known as: Senokot-S Take 2 tablets by mouth at bedtime.   silver sulfADIAZINE 1 % cream Commonly known as: Silvadene Use to area 2-3 times daily   Sodium Fluoride 5000 PPM 1.1 % Pste Generic drug: Sodium Fluoride BRUSH WITH A PEA SIZED AMOUNT FOR 2 MINS. THEN SPIT OUT THOROUGHLY TWICE DAILY. DO NOT RINSE. NOTHING BY MOUTH FOR 30 MINS.   solifenacin 10 MG tablet Commonly known as: VESIcare Take 1 tablet (10 mg total) by mouth daily.   TYLENOL SINUS+HEADACHE PO Take 2 tablets by mouth 2 (two) times daily as needed (sinus pain).   Varenicline Tartrate (Starter) 0.5 MG X 11 & 1 MG X 42 Tbpk Take by mouth as directed. Starting pack   Vitamin C Chew Chew 2 tablets by mouth daily.        Follow-up Information     [facility] Follow up.   Why: Will contact you to schedule home health visits.        Brecken Walth, Gustavus Messing, DO. Call.   Specialty: General Surgery Why: Call to schedule a follow up appointment in 2 weeks Contact information: [address] [place] [facility] [address] [phone]                 Signed: Santina Evans A Danile Trier 01/23/2024, 1:05 PM

## 2024-01-23 NOTE — Progress Notes (Signed)
 [facility] Progress Note  4 Days Post-Op  Subjective: Patient seen and examined.  She is resting comfortably in bed.  She has a small amount of pain associated with her abdominal wall wound, but otherwise has no complaints.  She is asking when she can be discharged home.  Objective: Vital signs in last 24 hours: Temp:  [97.7 F (36.5 C)-98.4 F (36.9 C)] 97.7 F (36.5 C) (04/08 0413) Pulse Rate:  [84-101] 88 (04/08 0413) Resp:  [18-20] 18 (04/08 0413) BP: (94-114)/(66-70) 114/66 (04/08 0413) SpO2:  [97 %-99 %] 98 % (04/08 0413) Last BM Date : 01/21/24  Intake/Output from previous day: 04/07 0701 - 04/08 0700 In: 2030.4 [P.O.:480; IV Piggyback:1550.4] Out: -  Intake/Output this shift: No intake/output data recorded.  General appearance: alert, cooperative, and no distress GI: Abdomen soft, nondistended, no percussion tenderness, mild TTP around her abdominal wound, wound vac in place on suction, minimal drainage in canister  Lab Results:  Recent Labs    01/22/24 0451 01/23/24 0458  WBC 15.9* 14.9*  HGB 10.4* 10.2*  HCT 33.1* 33.4*  PLT 308 345   BMET Recent Labs    01/21/24 0516 01/22/24 0451  NA 135 138  K 3.4* 3.7  CL 98 102  CO2 26 26  GLUCOSE 117* 107*  BUN 7 5*  CREATININE 0.68 0.64  CALCIUM 8.7* 8.5*   PT/INR No results for input(s): "LABPROT", "INR" in the last 72 hours.  Studies/Results: No results found.  Anti-infectives: Anti-infectives (From admission, onward)    Start     Dose/Rate Route Frequency Ordered Stop   01/22/24 0200  vancomycin (VANCOREADY) IVPB 1750 mg/350 mL        1,750 mg 175 mL/hr over 120 Minutes Intravenous Every 12 hours 01/21/24 1229     01/21/24 1315  vancomycin (VANCOREADY) IVPB 2000 mg/400 mL        2,000 mg 200 mL/hr over 120 Minutes Intravenous  Once 01/21/24 1229 01/21/24 1813   01/20/24 1200  fluconazole (DIFLUCAN) tablet 150 mg        150 mg Oral  Once 01/20/24 1045 01/20/24 1134   01/19/24  1545  meropenem (MERREM) 1 g in sodium chloride 0.9 % 100 mL IVPB        1 g 200 mL/hr over 30 Minutes Intravenous Every 8 hours 01/19/24 1532     01/19/24 1530  clindamycin (CLEOCIN) IVPB 900 mg  Status:  Discontinued        900 mg 100 mL/hr over 30 Minutes Intravenous  Once 01/19/24 1523 01/22/24 0942   01/19/24 1515  vancomycin (VANCOREADY) IVPB 2000 mg/400 mL        2,000 mg 200 mL/hr over 120 Minutes Intravenous  Once 01/19/24 1500 01/19/24 1710   01/19/24 1500  vancomycin (VANCOCIN) IVPB 1000 mg/200 mL premix  Status:  Discontinued        1,000 mg 200 mL/hr over 60 Minutes Intravenous  Once 01/19/24 1457 01/19/24 1459       Assessment/Plan:  Patient is a 38 year old female who was admitted s/p incision, drainage, and debridement of abdominal wall necrotizing soft tissue infection on 4/4.   - Wound VAC placed at bedside yesterday, as wound appeared viable without any evidence of tissue necrosis.  Will recommend wound VAC change every 3 days - Wound is significantly large in size and patient would benefit from wound vac therapy to aide with healing of the wound and to decrease frequency of necessary dressing changes - Gram  stain with gram-negative rods and gram-positive cocci, culture reincubated for better growth - Continue broad-spectrum antibiotics with meropenem, vancomycin, and clindamycin given necrotizing soft tissue infection present at the time of surgery. - Await culture results to help with tailoring outpatient antibiotics - WBC count continues to improve, 14.9 from 15.9 - Appreciate social worker's assistance with set up of home health care to assist with wound VAC changes - Continue regular diet - Glycemic control per hospitalist - Appreciate hospitalist recommendations - Patient stable for discharge home once we set up home health care/wound vac management and cultures have results to help with outpatient antibiotic decision making   LOS: 4 days    Zooey Schreurs A  Daiveon Markman 01/23/2024  Note: Portions of this report may have been transcribed using voice recognition software. Every effort has been made to ensure accuracy; however, inadvertent computerized transcription errors may still be present.

## 2024-01-23 NOTE — Plan of Care (Signed)
  Problem: Clinical Measurements: Goal: Will remain free from infection Outcome: Progressing   Problem: Coping: Goal: Level of anxiety will decrease Outcome: Progressing   Problem: Health Behavior/Discharge Planning: Goal: Ability to manage health-related needs will improve Outcome: Progressing

## 2024-01-23 NOTE — Telephone Encounter (Signed)
 This encounter was created in error - please disregard.

## 2024-01-23 NOTE — Progress Notes (Signed)
 Patient is at the nurse's station being hostile and using vulgar language towards staff. Myself advised the patient that she can not leave with the hospitals wound vac and I messaged Doctor Pappayliou to advise her that the patient is eager to go home.

## 2024-01-23 NOTE — Progress Notes (Signed)
 Myself went over the patient's discharge summary and med list. Myself then removed her PIV. Then the charge nurse Marcelino Duster [place]) and myself changed the patient from the hospitals wound vac to the portable one for her to take home. We assessed the wound and ensured that the wound vac was working. Myself then notified the patient she may call her transportation and to call to the nurse's station when she was ready to be wheeled down so staff could help her carry things to her transportation when they got here.

## 2024-01-23 NOTE — Progress Notes (Addendum)
 PROGRESS NOTE  Judy Nelson, is a 38 y.o. female, DOB - 27-May-1986, NFA:[id number]  Admit date - 01/19/2024   Admitting Physician No admitting provider for patient encounter.  Outpatient Primary MD for the patient is Lianne Moris, PA-C  LOS - 4  Chief Complaint  Patient presents with   Skin Problem      Brief Narrative:  38 y.o. female with medical history significant of diabetes, hypertension, GERD, asthma admitted on 01/19/2024 with anterior abdominal wall abscess, s/p  Incision, drainage, and debridement of abdominal wall necrotizing soft tissue infection, wound measuring 25 x 20 x 8 cm on 01/19/24.    -Assessment and Plan: 1)Anterior Abdominal wall Abscess/Necrotizing soft tissue infection-- Incision, drainage, and debridement of abdominal wall necrotizing soft tissue infection, wound measuring 25 x 20 x 8 cm on 01/19/24 by Dr Michelle Nasuti -- Further management per general surgery Wound Gram stain with gram-negative rods and gram-positive cocci,  -Wound culture NGTD -WBC -31.6 >>23.9 >>21.7>>15.9>>14.9 No fever  Or chills  -Tolerating oral intake well -Antibiotics per General Surgeon- -wound VAC care per general surgeon -Patient is being discharged home on 01/23/2024 by general surgeon  2)DM2-A1c 8.7 reflecting uncontrolled DM with hyperglycemia PTA -PTA diabetic medications and follow-up with PCP for further adjustments of diabetic regimen to improve diabetic control  3)Tobacco abuse--- smoking cessation advised -Nicotine patch advised to help quit smoking  4)HLD--- continue Crestor  5) possible vaginal candidiasis--Per report from patient and symptoms -Pelvic exam deferred -Treated empirically with antifungal  6)Hypokalemia--- normalized after replacement     Disposition: The patient is from: Home              Anticipated d/c is to: Home  with [facility] services for wound VAC purposes per general surgeon              Code Status :  -  Code Status: Full Code   Family  Communication:    (patient is alert, awake and coherent)  Discussed with patient's husband   DVT Prophylaxis  :   - SCDs      Lab Results  Component Value Date   PLT 345 01/23/2024    Inpatient Medications  Scheduled Meds:  acetaminophen  1,000 mg Oral Q6H   ALPRAZolam  1 mg Oral TID   Chlorhexidine Gluconate Cloth  6 each Topical Daily   clotrimazole  1 Applicatorful Vaginal QHS   DULoxetine  60 mg Oral BID   fesoterodine  4 mg Oral Daily   gabapentin  800 mg Oral BID   heparin injection (subcutaneous)  5,000 Units Subcutaneous Q8H   hydrOXYzine  100 mg Oral QHS   insulin aspart  0-20 Units Subcutaneous Q4H   insulin glargine-yfgn  40 Units Subcutaneous QHS   nicotine  21 mg Transdermal Daily   polyethylene glycol  17 g Oral Daily   rosuvastatin  10 mg Oral Daily   senna-docusate  2 tablet Oral QHS   Continuous Infusions:  meropenem (MERREM) IV 1 g (01/23/24 0534)   vancomycin 1,750 mg (01/23/24 1242)   PRN Meds:.alum & mag hydroxide-simeth, melatonin, morphine injection, ondansetron **OR** ondansetron (ZOFRAN) IV, oxyCODONE   Anti-infectives (From admission, onward)    Start     Dose/Rate Route Frequency Ordered Stop   01/23/24 0000  doxycycline (VIBRAMYCIN) 100 MG capsule        100 mg Oral 2 times daily 01/23/24 1258 02/03/24 2359   01/23/24 0000  amoxicillin-clavulanate (AUGMENTIN) 500-125 MG tablet  1 tablet Oral 3 times daily 01/23/24 1258 02/03/24 2359   01/22/24 0200  vancomycin (VANCOREADY) IVPB 1750 mg/350 mL        1,750 mg 175 mL/hr over 120 Minutes Intravenous Every 12 hours 01/21/24 1229     01/21/24 1315  vancomycin (VANCOREADY) IVPB 2000 mg/400 mL        2,000 mg 200 mL/hr over 120 Minutes Intravenous  Once 01/21/24 1229 01/21/24 1813   01/20/24 1200  fluconazole (DIFLUCAN) tablet 150 mg        150 mg Oral  Once 01/20/24 1045 01/20/24 1134   01/19/24 1545  meropenem (MERREM) 1 g in sodium chloride 0.9 % 100 mL IVPB        1 g 200 mL/hr over  30 Minutes Intravenous Every 8 hours 01/19/24 1532     01/19/24 1530  clindamycin (CLEOCIN) IVPB 900 mg  Status:  Discontinued        900 mg 100 mL/hr over 30 Minutes Intravenous  Once 01/19/24 1523 01/22/24 0942   01/19/24 1515  vancomycin (VANCOREADY) IVPB 2000 mg/400 mL        2,000 mg 200 mL/hr over 120 Minutes Intravenous  Once 01/19/24 1500 01/19/24 1710   01/19/24 1500  vancomycin (VANCOCIN) IVPB 1000 mg/200 mL premix  Status:  Discontinued        1,000 mg 200 mL/hr over 60 Minutes Intravenous  Once 01/19/24 1457 01/19/24 1459         Subjective: Judy Nelson today has no fevers, no emesis,  No chest pain,   - -Resting comfortably -- Eager to go home  -Please see discharge summary dated 01/23/2024 by general surgeon  Objective: Vitals:   01/22/24 0356 01/22/24 1442 01/22/24 1931 01/23/24 0413  BP: 115/61 94/70 102/67 114/66  Pulse: 73 84 (!) 101 88  Resp: 20 18 20 18   Temp: 97.6 F (36.4 C) 97.8 F (36.6 C) 98.4 F (36.9 C) 97.7 F (36.5 C)  TempSrc: Oral Oral Oral Oral  SpO2: 97% 99% 97% 98%  Weight:      Height:        Intake/Output Summary (Last 24 hours) at 01/23/2024 1527 Last data filed at 01/23/2024 1329 Gross per 24 hour  Intake 2270.41 ml  Output --  Net 2270.41 ml   Filed Weights   01/19/24 0839 01/19/24 2031  Weight: (!) 137.9 kg (!) 139 kg    Physical Exam  Gen:- Awake Alert,  in no apparent distress  HEENT:- [place].AT, No sclera icterus Neck-Supple Neck,No JVD,.  Lungs-  CTAB , fair symmetrical air movement CV- S1, S2 normal, regular  Abd-  +ve B.Sounds, Abd Soft, increased truncal adiposity, much improved post operative tenderness, wound VAC in situ Extremity/Skin:- No  edema, pedal pulses present  Psych-affect is appropriate, oriented x3 Neuro-no new focal deficits, no tremors  Data Reviewed: I have personally reviewed following labs and imaging studies  CBC: Recent Labs  Lab 01/19/24 0954 01/20/24 1615 01/21/24 0516 01/22/24 0451  01/23/24 0458  WBC 31.6* 23.9* 21.7* 15.9* 14.9*  NEUTROABS 26.1*  --   --   --   --   HGB 13.0 11.5* 10.8* 10.4* 10.2*  HCT 39.1 35.2* 33.9* 33.1* 33.4*  MCV 82.0 83.4 83.7 83.8 85.0  PLT 294 288 287 308 345   Basic Metabolic Panel: Recent Labs  Lab 01/19/24 0954 01/21/24 0516 01/22/24 0451  NA 132* 135 138  K 3.3* 3.4* 3.7  CL 97* 98 102  CO2 25 26 26  GLUCOSE 102* 117* 107*  BUN 6 7 5*  CREATININE 0.84 0.68 0.64  CALCIUM 9.0 8.7* 8.5*  PHOS  --   --  3.6   GFR: Estimated Creatinine Clearance: 140.7 mL/min (by C-G formula based on SCr of 0.64 mg/dL). Liver Function Tests: Recent Labs  Lab 01/19/24 0954 01/22/24 0451  AST 11*  --   ALT 11  --   ALKPHOS 125  --   BILITOT 0.9  --   PROT 7.9  --   ALBUMIN 2.9* 2.3*    Recent Results (from the past 240 hours)  MRSA Next Gen by PCR, Nasal     Status: None   Collection Time: 01/19/24  4:55 PM   Specimen: Nasal Mucosa; Nasal Swab  Result Value Ref Range Status   MRSA by PCR Next Gen NOT DETECTED NOT DETECTED Final    Comment: (NOTE) The GeneXpert MRSA Assay (FDA approved for NASAL specimens only), is one component of a comprehensive MRSA colonization surveillance program. It is not intended to diagnose MRSA infection nor to guide or monitor treatment for MRSA infections. Test performance is not FDA approved in patients less than 22 years old. Performed at [facility], [address] [place], [place] [address]   Aerobic/Anaerobic Culture w Gram Stain (surgical/deep wound)     Status: None (Preliminary result)   Collection Time: 01/19/24  6:20 PM   Specimen: Path fluid; Body Fluid  Result Value Ref Range Status   Specimen Description   Final    FLUID Performed at [facility], [address], [place], [place] [address]    Special Requests   Final    NONE Performed at [facility], [address], [place], [place] [address]    Gram Stain   Final    MODERATE WBC SEEN ABUNDANT GRAM NEGATIVE RODS ABUNDANT GRAM  POSITIVE COCCI Performed at [facility], [address] [place], [place] [address]    Culture   Final    CULTURE REINCUBATED FOR BETTER GROWTH MIXED ANAEROBIC FLORA PRESENT.  CALL LAB IF FURTHER IID REQUIRED.    Report Status PENDING  Incomplete    Radiology Studies: No results found.  Scheduled Meds:  acetaminophen  1,000 mg Oral Q6H   ALPRAZolam  1 mg Oral TID   Chlorhexidine Gluconate Cloth  6 each Topical Daily   clotrimazole  1 Applicatorful Vaginal QHS   DULoxetine  60 mg Oral BID   fesoterodine  4 mg Oral Daily   gabapentin  800 mg Oral BID   heparin injection (subcutaneous)  5,000 Units Subcutaneous Q8H   hydrOXYzine  100 mg Oral QHS   insulin aspart  0-20 Units Subcutaneous Q4H   insulin glargine-yfgn  40 Units Subcutaneous QHS   nicotine  21 mg Transdermal Daily   polyethylene glycol  17 g Oral Daily   rosuvastatin  10 mg Oral Daily   senna-docusate  2 tablet Oral QHS   Continuous Infusions:  meropenem (MERREM) IV 1 g (01/23/24 0534)   vancomycin 1,750 mg (01/23/24 1242)    LOS: 4 days  Shon Hale M.D on 01/23/2024 at 3:27 PM  Go to [email or web address] - for contact info  [facility] - Office  [phone]  If 7PM-7AM, please contact night-coverage [email or web address] 01/23/2024, 3:27 PM

## 2024-01-23 NOTE — Discharge Instructions (Signed)
 Ambulatory Surgery Discharge Instructions  General Anesthesia or Sedation Do not drive or operate heavy machinery for 24 hours.  Do not consume alcohol, tranquilizers, sleeping medications, or any non-prescribed medications for 24 hours. Do not make important decisions or sign any important papers in the next 24 hours. You should have someone with you tonight at home.  Activity  You are advised to go directly home from the hospital.  Restrict your activities and rest for a day.  Resume light activity tomorrow. No heavy lifting over 10 lbs or strenuous exercise.  Fluids and Diet Begin with clear liquids, bouillon, dry toast, soda crackers.  If not nauseated, you may go to a regular diet when you desire.  Greasy and spicy foods are not advised.  Medications  If you have not had a bowel movement in 24 hours, take 2 tablespoons over the counter Milk of mag.             You May resume your blood thinners tomorrow (Aspirin, coumadin, or other).  You are being discharged with prescriptions for Opioid/Narcotic Medications: There are some specific considerations for these medications that you should know. Opioid Meds have risks & benefits. Addiction to these meds is always a concern with prolonged use Take medication only as directed Do not drive while taking narcotic pain medication Do not crush tablets or capsules Do not use a different container than medication was dispensed in Lock the container of medication in a cool, dry place out of reach of children and pets. Opioid medication can cause addiction Do not share with anyone else (this is a felony) Do not store medications for future use. Dispose of them properly.     Disposal:  Find a [employer] household drug take back site near you.  If you can't get to a drug take back site, use the recipe below as a last resort to dispose of expired, unused or unwanted drugs. Disposal  (Do not dispose chemotherapy drugs this way, talk to your  prescribing doctor instead.) Step 1: Mix drugs (do not crush) with dirt, kitty litter, or used coffee grounds and add a small amount of water to dissolve any solid medications. Step 2: Seal drugs in plastic bag. Step 3: Place plastic bag in trash. Step 4: Take prescription container and scratch out personal information, then recycle or throw away.  Operative Site  You have a wound vacuum in place.  This will be changed every 3 days by the home health care nurse. Keep the surrounding area and dressing clean and dry. No baths or swimming.  Contact Information: If you have questions or concerns, please call our office, [phone], Monday- Thursday 8AM-5PM and Friday 8AM-12Noon.  If it is after hours or on the weekend, please call [facility] Main [facility], [phone], and ask to speak to the surgeon on call for Dr. Robyne Peers at [facility].   SPECIFIC COMPLICATIONS TO WATCH FOR: Inability to urinate Fever over 101? F by mouth Nausea and vomiting lasting longer than 24 hours. Pain not relieved by medication ordered Swelling around the operative site Increased redness, warmth, hardness, around operative area Numbness, tingling, or cold fingers or toes Blood -soaked dressing, (small amounts of oozing may be normal) Increasing and progressive drainage from surgical area or exam site   Wound Care Instructions:  -Change wound vac every 3 days -Cleanse wound with wound cleanser prior to re-applying new dressing -Wound is currently packed with a medium black sponge deep and a trimmed large sponge on top

## 2024-01-24 LAB — AEROBIC/ANAEROBIC CULTURE W GRAM STAIN (SURGICAL/DEEP WOUND)

## 2024-02-01 ENCOUNTER — Telehealth: Payer: Self-pay | Admitting: *Deleted

## 2024-02-01 LAB — GLUCOSE, CAPILLARY
Glucose-Capillary: 117 mg/dL — ABNORMAL HIGH (ref 70–99)
Glucose-Capillary: 170 mg/dL — ABNORMAL HIGH (ref 70–99)
Glucose-Capillary: 186 mg/dL — ABNORMAL HIGH (ref 70–99)

## 2024-02-01 MED ORDER — SANTYL 250 UNIT/GM EX OINT: TOPICAL_OINTMENT | CUTANEOUS | 0 refills | Status: DC

## 2024-02-01 MED ORDER — MUPIROCIN 2 % EX OINT: TOPICAL_OINTMENT | CUTANEOUS | 1 refills | Status: DC

## 2024-02-01 NOTE — Telephone Encounter (Signed)
 Received call from [place], [facility] with [facility] [phone]~ telephone.   Reports that abdominal wound scores have changed from   edges are shrinking. States that patient new yellow slough on <25% of wound bed. Requested verbal orders to apply Mupirocin ointment and Santyl to slough prior to placing wound vac. Verbal orders given. Requested prescriptions to be sent to patient pharmacy of choice.   Message routed to on call provider for approval.

## 2024-02-01 NOTE — Telephone Encounter (Signed)
 Received authorization for prescriptions from on call provider, Dr. Larrie Po.   Medications sent to pharmacy on file.

## 2024-02-07 ENCOUNTER — Encounter: Payer: Self-pay | Admitting: Surgery

## 2024-02-07 ENCOUNTER — Ambulatory Visit (INDEPENDENT_AMBULATORY_CARE_PROVIDER_SITE_OTHER): Admitting: Surgery

## 2024-02-07 VITALS — BP 117/78 | HR 106 | Temp 98.1°F | Resp 14 | Ht 67.0 in | Wt 303.0 lb

## 2024-02-07 DIAGNOSIS — S31109D Unspecified open wound of abdominal wall, unspecified quadrant without penetration into peritoneal cavity, subsequent encounter: Secondary | ICD-10-CM

## 2024-02-07 NOTE — Progress Notes (Unsigned)
 [facility] Note   HPI:  38 y.o. Female presents to clinic for post-op follow-up status post incision, drainage, and debridement of an abdominal wall necrotizing soft tissue infection on 4/4.  She was discharged home from the hospital on 4/8 with a wound VAC in place.  She has been getting her wound VAC changed 2-3 times a week by home nursing.  Her wound nurse advised she get Santyl  to apply to part of her wound, but she has been dealing with trying to get insurance coverage for this.  She denies fevers and chills.  Her pain is controlled with oral medications.  She is tolerating a diet without nausea and vomiting.  Review of Systems:  All other review of systems: otherwise negative   Vital Signs:  BP 117/78   Pulse (!) 106   Temp 98.1 F (36.7 C) (Oral)   Resp 14   Ht 5\' 7"  (1.702 m)   Wt (!) 303 lb (137.4 kg)   LMP 01/14/2024   SpO2 97%   BMI 47.46 kg/m    Physical Exam:  Physical Exam Vitals reviewed.  Constitutional:      Appearance: Normal appearance.  Abdominal:     Comments: Abdomen soft, obese, no percussion tenderness, nontender to palpation; abdominal wound just to the right of the umbilicus has improved in size with pink granulation tissue at the base, small amount of necrotic fat at the superior aspect of the wound, and a small amount of ischemic skin at the medial lateral aspect of the wound  Neurological:     Mental Status: She is alert.     Laboratory studies: None  Imaging:  None  Assessment:  38 y.o. yo Female who presents for follow-up status post incision, drainage, and debridement of abdominal wall necrotizing soft tissue infection on 4/4.  Plan:  - Patient has been doing well since the surgery - Advised that we will continue with 2-3 times weekly wound VAC changes, given the wound is still significantly large in size.  Discussed that once the wound become smaller, we will likely eventually transition to wet-to-dry dressing changes -  I debrided some of the necrotic fat and ischemic skin to the wound.  Advised that she should continue trying to get her Santyl  prescription covered by her insurance company - Call the office if she begins to have worsening abdominal pain, fevers, chills, worsening of her wound, or any other concerns - Follow up in 1 month for wound recheck  All of the above recommendations were discussed with the patient, and all of patient's questions were answered to her expressed satisfaction.  Note: Portions of this report may have been transcribed using voice recognition software. Every effort has been made to ensure accuracy; however, inadvertent computerized transcription errors may still be present.   Lidia Reels, DO [facility] [address] [place], [place] [address] [phone] (office)

## 2024-02-14 ENCOUNTER — Telehealth: Payer: Self-pay | Admitting: *Deleted

## 2024-02-14 NOTE — Telephone Encounter (Signed)
 Received call from [place], [facility] with [facility] [phone]~ telephone.   Reports that patient continues wound care to abdominal wall with wound vac. States that insurance is only providing 10 canisters per month for wound vac. States that canisters are full when changed, and patient is using > 10 canisters per month.   Reports that more canisters are being sent to patient home for use, but there was no canister available for dressing change on 02/14/2024.   Reports that wet to dry dressing was performed on 02/14/2024, and wound vac will be applied at next visit.   Provider to be made aware.

## 2024-02-20 ENCOUNTER — Telehealth: Payer: Self-pay

## 2024-02-20 NOTE — Telephone Encounter (Signed)
Tried to return a call to pt but did not receive an answer and was unable to leave a message.

## 2024-02-22 ENCOUNTER — Encounter: Payer: Self-pay | Admitting: "Endocrinology

## 2024-02-22 ENCOUNTER — Ambulatory Visit: Payer: Medicaid Other | Admitting: "Endocrinology

## 2024-02-22 VITALS — BP 118/84 | HR 68 | Ht 67.0 in | Wt 303.4 lb

## 2024-02-22 DIAGNOSIS — Z794 Long term (current) use of insulin: Secondary | ICD-10-CM

## 2024-02-22 DIAGNOSIS — E782 Mixed hyperlipidemia: Secondary | ICD-10-CM | POA: Diagnosis not present

## 2024-02-22 DIAGNOSIS — E1165 Type 2 diabetes mellitus with hyperglycemia: Secondary | ICD-10-CM | POA: Diagnosis not present

## 2024-02-22 LAB — POCT UA - MICROALBUMIN
Creatinine, POC: 300 mg/dL
Microalbumin Ur, POC: 150 mg/L

## 2024-02-22 MED ORDER — OZEMPIC (0.25 OR 0.5 MG/DOSE) 2 MG/3ML ~~LOC~~ SOPN: 0.5000 mg | PEN_INJECTOR | SUBCUTANEOUS | 1 refills | Status: DC

## 2024-02-22 NOTE — Progress Notes (Signed)
 02/22/2024, 5:17 PM  Endocrinology follow-up note   Subjective:    Patient ID: Judy Nelson, female    DOB: 1986/04/09.  Judy Nelson is being seen in follow-up after she was seen in consultation for management of currently uncontrolled symptomatic diabetes requested by  Judy Jarred, PA-C.  Patient was seen in April 2021 in consult for type 2 diabetes.  She did not return for follow-up.  Past Medical History:  Diagnosis Date   Anemia    Anxiety    Asthma    CSF leak    Depression    GERD (gastroesophageal reflux disease)    Heartburn 09/01/2015   no current med.   History of anemia 01/2014   Hypertension    Non-insulin  dependent type 2 diabetes mellitus (HCC)    Obesity    PTSD (post-traumatic stress disorder)    Scar of breast 08/2015   left   Sinus headache     Past Surgical History:  Procedure Laterality Date   BREAST SURGERY Bilateral    bilateral breast surgery to remove cyst at [facility] 2017   CRANIOTOMY N/A 06/30/2017   Procedure: BIFRONTAL CRANIOTOMY;  Surgeon: Augusto Blonder, MD;  Location: [facility] OR;  Service: Neurosurgery;  Laterality: N/A;  BIFRONTAL CRANIOTOMY Repair of Encephalocele   GANGLION CYST EXCISION Left 02/07/2023   Procedure: REMOVAL GANGLION OF WRIST;  Surgeon: Darrin Emerald, MD;  Location: [facility];  Service: Orthopedics;  Laterality: Left;  LMA   INCISION AND DRAINAGE ABSCESS Left 10/07/2014   Procedure: INCISION AND DRAINAGE LEFT BREAST ABSCESS;  Surgeon: Beau Bound, MD;  Location: [facility];  Service: General;  Laterality: Left;   INCISION AND DRAINAGE ABSCESS N/A 01/19/2024   Procedure: INCISION AND DRAINAGE ABDOMINAL WALL ABSCESS;  Surgeon: Marijo Shove, DO;  Location: [facility];  Service: General;  Laterality: N/A;   LAPAROSCOPIC OVARIAN CYSTECTOMY Right 02/05/2014   Procedure: LAPAROSCOPIC right retroperitoneal (NOT OVARIAN) CYSTECTOMY;  Surgeon: Wendelyn Halter, MD;  Location: [facility];  Service: Gynecology;  Laterality: Right;   MASS EXCISION Left 09/07/2015   Procedure: Minor SCAR EXCISION LEFT BREAST, PLASTIC CLOSURE;  Surgeon: Phyllis Breeze, MD;  Location: [facility];  Service: Plastics;  Laterality: Left;   TYMPANOSTOMY TUBE PLACEMENT Bilateral 10/17/2014   WOUND DEBRIDEMENT N/A 01/19/2024   Procedure: DEBRIDEMENT OF ABDOMINAL WALL NECROTIZING SOFT TISSUE INFECTION, 25 X 20 X 8 CM;  Surgeon: Marijo Shove, DO;  Location: [facility];  Service: General;  Laterality: N/A;    Social History   Socioeconomic History   Marital status: Single    Spouse name: Not on file   Number of children: Not on file   Years of education: Not on file   Highest education level: Not on file  Occupational History   Not on file  Tobacco Use   Smoking status: Every Day    Current packs/day: 0.50    Average packs/day: 0.5 packs/day for 7.0 years (3.5 ttl pk-yrs)    Types: Cigarettes   Smokeless tobacco: Never  Vaping Use   Vaping status: Never Used  Substance and Sexual Activity   Alcohol use: Not Currently   Drug use: Yes    Types: Marijuana    Comment:  THC gummies   Sexual activity: Yes    Birth control/protection: None  Other Topics Concern   Not on file  Social History Narrative   Not on file   Social Drivers of Health   Financial Resource Strain: Not on file  Food Insecurity: No Food Insecurity (01/19/2024)   Hunger Vital Sign    Worried About Running Out of Food in the Last Year: Never true    Ran Out of Food in the Last Year: Never true  Transportation Needs: No Transportation Needs (01/19/2024)   PRAPARE - Administrator, Civil Service (Medical): No    Lack of Transportation (Non-Medical): No  Physical Activity: Not on file  Stress: Not on file  Social Connections: Not on file    Family History  Problem Relation Age of Onset   Diabetes Father    Hypertension Father    Heart disease Daughter         heart murmur   Cancer Maternal Grandmother        ovarian cancer   Obesity Paternal Grandmother    Diabetes Paternal Grandfather     Outpatient Encounter Medications as of 02/22/2024  Medication Sig   albuterol  (PROVENTIL  HFA;VENTOLIN  HFA) 108 (90 Base) MCG/ACT inhaler Inhale 2 puffs into the lungs every 6 (six) hours as needed for wheezing or shortness of breath.   ALPRAZolam  (XANAX ) 1 MG tablet TAKE 1 TABLET BY MOUTH THREE TIMES DAILY   amphetamine-dextroamphetamine (ADDERALL) 30 MG tablet Take 15 mg by mouth daily as needed (focusing).   azithromycin (ZITHROMAX) 250 MG tablet Take 250 mg by mouth as directed. Dose pack   Bioflavonoid Products (VITAMIN C) CHEW Chew 2 tablets by mouth daily.   clindamycin  (CLINDAGEL) 1 % gel Apply topically 2 (two) times daily.   collagenase  (SANTYL ) 250 UNIT/GM ointment Apply to abdominal wound during wound vac changes Q 3days.   Continuous Glucose Receiver (DEXCOM G7 RECEIVER) DEVI Use to monitor BG continuously   Continuous Glucose Sensor (DEXCOM G7 SENSOR) MISC CHANGE SENSOR EVERY 10 DAYS   DULoxetine  (CYMBALTA ) 60 MG capsule Take 60 mg by mouth 2 (two) times daily.   Emollient (GOLD BOND DIABETICS DRY SKIN) CREA Apply 1 Application topically daily as needed (pain). Uses with aspercreme   fluticasone  (FLONASE ) 50 MCG/ACT nasal spray Place 2 sprays into both nostrils daily.   gabapentin  (NEURONTIN ) 800 MG tablet Take 800 mg by mouth 2 (two) times daily.   hydrOXYzine  (ATARAX ) 50 MG tablet Take 100 mg by mouth at bedtime.   ibuprofen  (ADVIL ) 800 MG tablet Take 1 tablet (800 mg total) by mouth every 8 (eight) hours as needed.   insulin  aspart (NOVOLOG  FLEXPEN) 100 UNIT/ML FlexPen Inject 10-16 Units into the skin 3 (three) times daily before meals.   insulin  glargine (LANTUS ) 100 UNIT/ML injection Inject 80 Units into the skin at bedtime.   losartan (COZAAR) 25 MG tablet Take 25 mg by mouth daily.   Multiple Vitamin (MULTIVITAMIN) tablet Take 1 tablet by  mouth daily.   mupirocin  ointment (BACTROBAN ) 2 % Apply to abdominal wound during wound vac changes Q 3days.   ondansetron  (ZOFRAN ) 4 MG tablet Take 1 tablet (4 mg total) by mouth daily as needed for nausea or vomiting.   ondansetron  (ZOFRAN -ODT) 8 MG disintegrating tablet Take 8 mg by mouth every 8 (eight) hours as needed for nausea or vomiting.   oxyCODONE -acetaminophen  (PERCOCET) 10-325 MG tablet Take 1 tablet by mouth every 4 (four) hours as needed for pain.  OZEMPIC , 0.25 OR 0.5 MG/DOSE, 2 MG/3ML SOPN Inject 0.5 mg into the skin once a week.   Phenylephrine -Acetaminophen  (TYLENOL  SINUS+HEADACHE PO) Take 2 tablets by mouth 2 (two) times daily as needed (sinus pain).   polyethylene glycol (MIRALAX  / GLYCOLAX ) 17 g packet Take 17 g by mouth daily.   rosuvastatin  (CRESTOR ) 10 MG tablet Take 1 tablet (10 mg total) by mouth daily. (Patient taking differently: Take 5 mg by mouth every other day.)   senna-docusate (SENOKOT-S) 8.6-50 MG tablet Take 2 tablets by mouth at bedtime.   silver  sulfADIAZINE  (SILVADENE ) 1 % cream Use to area 2-3 times daily   SODIUM FLUORIDE 5000 PPM 1.1 % PSTE BRUSH WITH A PEA SIZED AMOUNT FOR 2 MINS. THEN SPIT OUT THOROUGHLY TWICE DAILY. DO NOT RINSE. NOTHING BY MOUTH FOR 30 MINS.   solifenacin  (VESICARE ) 10 MG tablet Take 1 tablet (10 mg total) by mouth daily.   Varenicline Tartrate, Starter, 0.5 MG X 11 & 1 MG X 42 TBPK Take by mouth as directed. Starting pack (Patient not taking: Reported on 02/07/2024)   [DISCONTINUED] dapagliflozin  propanediol (FARXIGA ) 10 MG TABS tablet Take 1 tablet (10 mg total) by mouth daily before breakfast.   [DISCONTINUED] OZEMPIC , 0.25 OR 0.5 MG/DOSE, 2 MG/3ML SOPN Inject 0.5 mg into the skin once a week. (Patient not taking: Reported on 01/20/2024)   No facility-administered encounter medications on file as of 02/22/2024.    ALLERGIES: No Known Allergies   VACCINATION STATUS: Immunization History  Administered Date(s) Administered    Influenza,inj,Quad PF,6+ Mos 07/16/2013   Tdap 11/04/2013, 11/16/2021    Diabetes She presents for her follow-up diabetic visit. She has type 2 diabetes mellitus. Onset time: She was diagnosed at approximate age of 25 years. Her disease course has been worsening. There are no hypoglycemic associated symptoms. Pertinent negatives for hypoglycemia include no confusion, headaches, pallor or seizures. Associated symptoms include fatigue, polydipsia and polyuria. Pertinent negatives for diabetes include no blurred vision, no chest pain and no polyphagia. There are no hypoglycemic complications. Symptoms are worsening. Diabetic complications include peripheral neuropathy. 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  She is status post debridement, currently on wound VAC.  She is status post a course of antibiotics as well.) Risk factors for coronary artery disease include dyslipidemia, diabetes mellitus, obesity, sedentary lifestyle, tobacco exposure and family history. Current diabetic treatment includes insulin  injections. Her weight is decreasing steadily. She is following a generally unhealthy diet. When asked about meal planning, she reported none. She has not had a previous visit with a dietitian. She never participates in exercise. Her home blood glucose trend is increasing steadily. Her breakfast blood glucose range is generally 180-200 mg/dl. Her lunch blood glucose range is generally 180-200 mg/dl. Her dinner blood glucose range is generally 180-200 mg/dl. Her bedtime blood glucose range is generally 180-200 mg/dl. Her overall blood glucose range is 180-200 mg/dl. (Ms. Nesmith presents with her Dexcom device showing significantly above target glycemic profile averaging 227 over the last 14 days.  Her overview shows 21% in range, 44% Libre 1 hyperglycemia, 35% Libre to hyperglycemia.  She has no hypoglycemia.  She is recovering from  necrotizing fasciitis which required several debridements on anterior abdominal wall currently using a wound VAC. Her previsit A1c was 8.7% increasing from her last visit A1c of 8.1%, however overall improvement from 10.7%.    ) An ACE inhibitor/angiotensin II receptor blocker is not being taken.  Hyperlipidemia This  is a chronic problem. The current episode started more than 1 year ago. The problem is uncontrolled. Exacerbating diseases include diabetes. Factors aggravating her hyperlipidemia include smoking. Pertinent negatives include no chest pain, myalgias or shortness of breath. She is currently on no antihyperlipidemic treatment. Risk factors for coronary artery disease include diabetes mellitus, dyslipidemia, family history, obesity, a sedentary lifestyle and hypertension.  Hypertension This is a chronic problem. The problem is controlled. Pertinent negatives include no blurred vision, chest pain, headaches, palpitations or shortness of breath. Risk factors for coronary artery disease include dyslipidemia, diabetes mellitus, smoking/tobacco exposure, sedentary lifestyle and family history. Past treatments include calcium  channel blockers.       Objective:       02/22/2024    3:24 PM 02/07/2024    9:45 AM 01/23/2024    4:13 AM  Vitals with BMI  Height 5\' 7"  5\' 7"    Weight 303 lbs 6 oz 303 lbs   BMI 47.51 47.45   Systolic 118 117 161  Diastolic 84 78 66  Pulse 68 106 88    BP 118/84   Pulse 68   Ht 5\' 7"  (1.702 m)   Wt (!) 303 lb 6.4 oz (137.6 kg)   LMP 01/14/2024   BMI 47.52 kg/m   Wt Readings from Last 3 Encounters:  02/22/24 (!) 303 lb 6.4 oz (137.6 kg)  02/07/24 (!) 303 lb (137.4 kg)  01/19/24 (!) 306 lb 7 oz (139 kg)    Wound VAC in place, status postsurgical debridement of necrotizing fasciitis on anterior abdominal wall.   CMP ( most recent) CMP     Component Value Date/Time   NA 138 01/22/2024 0451   NA 141 10/24/2023 1353   K 3.7 01/22/2024 0451   CL 102  01/22/2024 0451   CO2 26 01/22/2024 0451   GLUCOSE 107 (H) 01/22/2024 0451   BUN 5 (L) 01/22/2024 0451   BUN 5 (L) 10/24/2023 1353   CREATININE 0.64 01/22/2024 0451   CREATININE 0.44 (L) 10/22/2013 1247   CALCIUM  8.5 (L) 01/22/2024 0451   PROT 7.9 01/19/2024 0954   PROT 6.8 10/24/2023 1353   ALBUMIN  2.3 (L) 01/22/2024 0451   ALBUMIN  3.8 (L) 10/24/2023 1353   AST 11 (L) 01/19/2024 0954   ALT 11 01/19/2024 0954   ALKPHOS 125 01/19/2024 0954   BILITOT 0.9 01/19/2024 0954   BILITOT 0.2 10/24/2023 1353   GFRNONAA >60 01/22/2024 0451   GFRAA >60 03/12/2018 1315     Diabetic Labs (most recent): Lab Results  Component Value Date   HGBA1C 8.7 (H) 01/20/2024   HGBA1C 8.1 (A) 10/25/2023   HGBA1C 8.1 (A) 06/22/2023   MICROALBUR 150 02/22/2024     Lipid Panel ( most recent) Lipid Panel     Component Value Date/Time   CHOL 167 10/24/2023 1353   TRIG 135 10/24/2023 1353   HDL 44 10/24/2023 1353   CHOLHDL 3.8 10/24/2023 1353   LDLCALC 99 10/24/2023 1353   LABVLDL 24 10/24/2023 1353     Lab Results  Component Value Date   TSH 0.928 03/14/2023   TSH 0.910 08/10/2022   TSH 1.41 02/09/2022   TSH 1.580 07/20/2015   TSH 1.250 10/05/2014   FREET4 1.10 03/14/2023   FREET4 1.00 08/10/2022      Assessment & Plan:   1. Uncontrolled type 2 diabetes mellitus with hyperglycemia (HCC)  - Carlye Komar has currently uncontrolled symptomatic type 2 DM since  38 years of age.  Ms. Pizano presents with her  Dexcom device showing significantly above target glycemic profile averaging 227 over the last 14 days.  Her overview shows 21% in range, 44% Libre 1 hyperglycemia, 35% Libre to hyperglycemia.  She has no hypoglycemia.  She is recovering from necrotizing fasciitis which required several debridements on anterior abdominal wall currently using a wound VAC. Her previsit A1c was 8.7% increasing from her last visit A1c of 8.1%, however overall improvement from 10.7%.   Recent labs  reviewed. -She presents with persistent hyperglycemia. -her diabetes is complicated by peripheral neuropathy, obesity/sedentary life, smoking and she remains at a high risk for more acute and chronic complications which include CAD, CVA, CKD, retinopathy, and neuropathy. These are all discussed in detail with her.  In light of her comorbid conditions of obesity, type 2 diabetes, hyperlipidemia, hypertension, she is a candidate for lifestyle medicine.  -She did not engage optimally to lifestyle medicine.  - she acknowledges that there is a room for improvement in her food and drink choices. - Suggestion is made for her to avoid simple carbohydrates  from her diet including Cakes, Sweet Desserts, Ice Cream, Soda (diet and regular), Sweet Tea, Candies, Chips, Cookies, Store Bought Juices, Alcohol , Artificial Sweeteners,  Coffee Creamer, and "Sugar-free" Products, Lemonade. This will help patient to have more stable blood glucose profile and potentially avoid unintended weight gain.  The following Lifestyle Medicine recommendations according to American College of Lifestyle Medicine  [facility]) were discussed and and offered to patient and she  agrees to start the journey:  A. Whole Foods, Plant-Based Nutrition comprising of fruits and vegetables, adequate amount of plant-based proteins, whole-grain carbohydrates was discussed in detail with the patient.   A list for source of those nutrients were also provided to the patient.  Patient will use only water or unsweetened tea for hydration. B.  The need to stay away from risky substances including alcohol, smoking; obtaining 7 to 9 hours of restorative sleep, at least 150 minutes of moderate intensity exercise weekly, the importance of healthy social connections,  and stress management techniques were discussed. C.  A full color page of  Calorie density of various food groups per pound showing examples of each food groups was provided to the patient.   -  she will be scheduled with Penny Crumpton, RDN, CDE for diabetes education.  - I have approached her with the following individualized plan to manage  her diabetes and patient agrees:   - In light of her recent necrotizing fasciitis on anterior abdominal wall, she is advised to discontinue Farxiga .    - She is advised to continue Lantus  80 units nightly, continue NovoLog  10 -16 units 3 times daily AC   for Premeal blood glucose readings above 90 mg per DL.  - she is urged to use her CGM continuously, and she is warned not to take insulin  without proper monitoring per orders.  - she is encouraged to call clinic for blood glucose levels less than 70 or above 200 mg /dl.  -She has been benefiting from Ozempic , advised to resume Ozempic  at 0.5 mg subcutaneously weekly.  This medication will be advanced as she tolerates.     - Specific targets for  A1c;  LDL, HDL,  and Triglycerides were discussed with the patient.  2) Blood Pressure /Hypertension:  - Her blood pressure is controlled to target.  Her urine microalbumin is elevated at 150 mg/L today.  3) Lipids/Hyperlipidemia:   Review of her recent lipid panel showed significant improvement in her  lipid panel including LDL at 99 improving from 154.  Her triglycerides are improving from 365 all the way to 135.   She is not tolerating a whole pill of Crestor , advised to take half of Crestor  10 mg (5 mg) p.o. daily at bedtime.  The above described WF PB diet will address dyslipidemia as well.     4)  Weight/Diet:  Body mass index is 47.52 kg/m.   -She continued to lose weight.  Her BMI is high, clearly complicating her diabetes care.   she is  a candidate for modest weight loss. I discussed with her the fact that loss of 5 - 10% of her  current body weight will have the most impact on her diabetes management.  Exercise, and detailed carbohydrates information provided  -  detailed on discharge instructions.  5) Chronic Care/Health  Maintenance:  -she  is not on ACEI/ARB and Statin medications and  is encouraged to initiate and continue to follow up with Ophthalmology, Dentist,  Podiatrist at least yearly or according to recommendations, and advised to  quit smoking. I have recommended yearly flu vaccine and pneumonia vaccine at least every 5 years; moderate intensity exercise for up to 150 minutes weekly; and  sleep for at least 7 hours a day.  - she is  advised to maintain close follow up with Judy Jarred, PA-C for primary care needs, as well as her other providers for optimal and coordinated care.   I spent  40  minutes in the care of the patient today including review of labs from CMP, Lipids, Thyroid Function, Hematology (current and previous including abstractions from other facilities); face-to-face time discussing  her blood glucose readings/logs, discussing hypoglycemia and hyperglycemia episodes and symptoms, medications doses, her options of short and long term treatment based on the latest standards of care / guidelines;  discussion about incorporating lifestyle medicine;  and documenting the encounter. Risk reduction counseling performed per USPSTF guidelines to reduce  obesity and cardiovascular risk factors.     Please refer to Patient Instructions for Blood Glucose Monitoring and Insulin /Medications Dosing Guide"  in media tab for additional information. Please  also refer to " Patient Self Inventory" in the Media  tab for reviewed elements of pertinent patient history.  Elise Guile participated in the discussions, expressed understanding, and voiced agreement with the above plans.  All questions were answered to her satisfaction. she is encouraged to contact clinic should she have any questions or concerns prior to her return visit.    Follow up plan: - Return in about 3 months (around 05/24/2024) for Bring Meter/CGM Device/Logs- A1c in Office.  Kalvin Orf, MD [facility] [address] [place], [place] [address] Phone: [phone]  Fax: [phone]    02/22/2024, 5:17 PM  This note was partially dictated with voice recognition software. Similar sounding words can be transcribed inadequately or may not  be corrected upon review.

## 2024-02-22 NOTE — Patient Instructions (Signed)

## 2024-03-04 ENCOUNTER — Telehealth: Payer: Self-pay | Admitting: *Deleted

## 2024-03-04 NOTE — Telephone Encounter (Signed)
 Call placed to patient.   States that she is not currently using Santyl .   Faxed pharmacy to D/C order.

## 2024-03-04 NOTE — Telephone Encounter (Signed)
 Received fax from [facility] in [place].  Santyl  is not covered by [facility] Medicaid.   Please advise.

## 2024-03-05 ENCOUNTER — Ambulatory Visit (INDEPENDENT_AMBULATORY_CARE_PROVIDER_SITE_OTHER): Admitting: Surgery

## 2024-03-05 ENCOUNTER — Encounter: Payer: Self-pay | Admitting: Surgery

## 2024-03-05 VITALS — BP 165/108 | HR 83 | Temp 97.5°F | Resp 16 | Ht 67.0 in | Wt 317.0 lb

## 2024-03-05 DIAGNOSIS — L729 Follicular cyst of the skin and subcutaneous tissue, unspecified: Secondary | ICD-10-CM

## 2024-03-05 DIAGNOSIS — S31109D Unspecified open wound of abdominal wall, unspecified quadrant without penetration into peritoneal cavity, subsequent encounter: Secondary | ICD-10-CM | POA: Diagnosis not present

## 2024-03-06 NOTE — Progress Notes (Signed)
 [facility] Note   HPI:  38 y.o. Female presents to clinic for post-op follow-up status post incision, drainage, and debridement of abdominal wall necrotizing soft tissue infection on 4/4.  Patient has been doing well since her last visit.  Her insurance company will only cover a certain number of wound VAC canisters a month, so she will transition between wound VAC and wet-to-dry dressings when she does not have a canister available.  Her wound nurse has been assisting her with dressing changes.  She denies significant pain, fever, chills, nausea, and vomiting.  She feels that the wound is significantly improved.  Her insurance company did not cover for the prescription of Santyl , so she has not been using that.  She has been applying antibiotic ointment to different areas of the wound.  Patient also wanted me to evaluate an area of hidradenitis on her left buttock.  She states that this area potentially started flaring about a week ago, at which time she received an antibiotic injection by her primary care doctor and was given 10 days of doxycycline .  Since that time, she denies any issues with the cyst/hidradenitis.  She is scheduled to meet with a dermatologist later this week regarding her hidradenitis.  Review of Systems:  All other review of systems: otherwise negative   Vital Signs:  BP (!) 165/108   Pulse 83   Temp (!) 97.5 F (36.4 C) (Oral)   Resp 16   Ht 5\' 7"  (1.702 m)   Wt (!) 317 lb (143.8 kg)   SpO2 96%   BMI 49.65 kg/m    Physical Exam:  Physical Exam Vitals reviewed.  Constitutional:      Appearance: Normal appearance. She is obese.  Abdominal:     Comments: Abdomen soft, nondistended, no percussion tenderness, nontender to palpation; no rigidity, guarding, rebound tenderness 11 x 8 cm wound just to the right of the umbilicus, pink granulation tissue at the base, without any evidence of fibrinous exudate or necrotic tissue  Skin:    General: Skin is  warm and dry.     Comments: Palpable cyst on the left buttock without tenderness, induration, or erythema  Neurological:     Mental Status: She is alert.     Laboratory studies: None  Imaging:  None   Assessment:  38 y.o. yo Female who presents for follow-up status post incision, drainage, and debridement of abdominal wall necrotizing soft tissue infection on 4/4.  Plan:  -Patient's wound looks significantly better and smaller since her last visit.  Will continue with wound VAC therapy, and wet-to-dry dressing changes in between wound VAC applications -As the wound become smaller, she may no longer be able to use the wound VAC for the wound.  At that time, would recommend that she transition to wet-to-dry dressing changes -No need to apply Santyl  to the wound.  I am okay with her applying antibiotic ointment during her wet-to-dry dressing changes -Discussed that she does not require any intervention for the left buttock cyst.  I explained that if the area becomes more painful inflamed, she can call the office and we can open the area up.  In the future, we can discuss excision of the cyst, but at this time I would focus on getting her abdominal wound healed before we proceed with any further surgeries -Follow-up with dermatology regarding hidradenitis -Follow up with me in 1 month  All of the above recommendations were discussed with the patient, and all of patient's questions  were answered to her expressed satisfaction.  Note: Portions of this report may have been transcribed using voice recognition software. Every effort has been made to ensure accuracy; however, inadvertent computerized transcription errors may still be present.   Lidia Reels, DO [facility] [address] [place], [place] [address] [phone] (office)

## 2024-03-15 ENCOUNTER — Telehealth: Payer: Self-pay | Admitting: *Deleted

## 2024-03-15 NOTE — Telephone Encounter (Signed)
 Received call from [place], [facility] SN with [facility].   HH SN reports that patient is unable to obtain adequate supplies for wound vac due to insurance restrictions.   Reports that patient is changing dressing daily as wet to dry dressing. States that wound bed is beefy red with granulation tissue noted. Reports that wound edges are shrinking.   States that she is concerned that patient may have reached max benefit from wound vac. Reports that she is concerned that if vac is applied when supplies are obtained that it may deteriorate the matrix forming in the wound bed.   Requested verbal order to change wound care orders to daily wet to dry dressing changes until patient is seen in clinic and reevaluated by provider.   Verbal order given.

## 2024-03-28 ENCOUNTER — Encounter: Payer: Self-pay | Admitting: Surgery

## 2024-03-28 ENCOUNTER — Ambulatory Visit (INDEPENDENT_AMBULATORY_CARE_PROVIDER_SITE_OTHER): Admitting: Surgery

## 2024-03-28 VITALS — Ht 67.0 in | Wt 308.0 lb

## 2024-03-28 DIAGNOSIS — S31109D Unspecified open wound of abdominal wall, unspecified quadrant without penetration into peritoneal cavity, subsequent encounter: Secondary | ICD-10-CM

## 2024-03-29 NOTE — Progress Notes (Signed)
 [facility]   HPI:  38 y.o. Female presents to clinic for for wound recheck status post incision, drainage, and debridement of an abdominal wall necrotizing soft tissue infection on 4/4.  Since her last visit, patient has been switched to wet-to-dry dressings.  She actually started doing dry to dry dressings based on her wound nurse recommendations, as she was getting overly moistened skin surrounding her wounds.  She denies any significant issues with her wound currently.  She denies fevers and chills.  Review of Systems:  All other review of systems: otherwise negative   Vital Signs:  Ht 5' 7 (1.702 m)   Wt (!) 308 lb (139.7 kg)   BMI 48.24 kg/m    Physical Exam:  Physical Exam Vitals reviewed.  Constitutional:      Appearance: Normal appearance.  Abdominal:     Comments: Abdomen soft, nondistended, no percussion tenderness, nontender palpation; no rigidity, guarding, rebound tenderness; 7 x 5 cm wound just to the right of the umbilicus within the skin fold, pink granulation tissue at the base   Neurological:     Mental Status: She is alert.     Laboratory studies: None  Imaging:  None  Assessment:  38 y.o. yo Female who presents for wound recheck status post incision, drainage, and debridement of an abdominal wall necrotizing soft tissue infection on 4/4.  Plan:  - Overall she has been doing well.  She has switched to dry to dry dressing changes - Advised that she should continue with dry gauze dressing changes daily.  We discussed if she is to begins to develop more fibrinous exudate at the wound, I may switch her back to wet-to-dry dressing changes - She does not need to apply any specific creams to her wound at this time - Follow up with me in 3 weeks  All of the above recommendations were discussed with the patient, and all of patient's questions were answered to her expressed satisfaction.  Note: Portions of this report may have been  transcribed using voice recognition software. Every effort has been made to ensure accuracy; however, inadvertent computerized transcription errors may still be present.   Lidia Reels, DO [facility] [address] [place], [place] [address] [phone] (office)

## 2024-04-15 ENCOUNTER — Telehealth: Admitting: Family Medicine

## 2024-04-15 DIAGNOSIS — R079 Chest pain, unspecified: Secondary | ICD-10-CM

## 2024-04-15 NOTE — Progress Notes (Signed)
    Needs in person due to chest pain that radiates to the back. ED is recommended  Patient acknowledged agreement and understanding of the plan.

## 2024-04-18 ENCOUNTER — Ambulatory Visit (INDEPENDENT_AMBULATORY_CARE_PROVIDER_SITE_OTHER): Admitting: Surgery

## 2024-04-18 ENCOUNTER — Encounter: Payer: Self-pay | Admitting: Internal Medicine

## 2024-04-18 ENCOUNTER — Encounter: Payer: Self-pay | Admitting: Surgery

## 2024-04-18 VITALS — BP 163/97 | HR 92 | Temp 98.0°F | Resp 16 | Ht 67.0 in | Wt 307.0 lb

## 2024-04-18 DIAGNOSIS — S31109D Unspecified open wound of abdominal wall, unspecified quadrant without penetration into peritoneal cavity, subsequent encounter: Secondary | ICD-10-CM

## 2024-04-18 DIAGNOSIS — R1310 Dysphagia, unspecified: Secondary | ICD-10-CM

## 2024-04-18 NOTE — Progress Notes (Signed)
 [facility]   HPI:  38 y.o. Female presents to clinic for wound recheck status post incision, drainage, and debridement of an abdominal wall necrotizing soft tissue infection on 4/4.  She has been continuing with wet to dry dressing changes since her last follow up on 6/12.  Her wound continues to decrease in size. Her biggest complaint is worsening dysphagia and GERD symptoms.  She stopped her ozempic  1 week ago, as she thought this was causing her symptoms. She denies fever and chills.    Review of Systems:  All other review of systems: otherwise negative   Vital Signs:  BP (!) 163/97   Pulse 92   Temp 98 F (36.7 C) (Oral)   Resp 16   Ht 5' 7 (1.702 m)   Wt (!) 307 lb (139.3 kg)   SpO2 98%   BMI 48.08 kg/m    Physical Exam:  Physical Exam Vitals reviewed.  Constitutional:      Appearance: Normal appearance.  Abdominal:     Comments: Abdomen soft, nondistended, non tender; abdominal wound measuring 3x2 cm with pink granulation tissue at the base  Neurological:     Mental Status: She is alert.      Laboratory studies: None   Imaging:  None   Assessment:  38 y.o. yo Female who presents for wound recheck status post incision, drainage, and debridement of abdominal wall necrotizing soft tissue infection on 4/4.  Plan:  - Patient has been doing well, and her wound continues to improve and decrease in size - Advised that she should continue wet-to-dry dressing changes while she has any granulation tissue at the base.  If the area becomes too small or difficult to perform wet-to-dry dressing changes, she can apply antibiotic ointment to the wound -Given the patient is complaining of dysphagia with worsening GERD, we will send referral to GI to evaluate for EGD and possible esophageal dilation if needed - Follow up with me in 3 weeks  All of the above recommendations were discussed with the patient, and all of patient's questions were answered to her  expressed satisfaction.  Note: Portions of this report may have been transcribed using voice recognition software. Every effort has been made to ensure accuracy; however, inadvertent computerized transcription errors may still be present.   Dorothyann Brittle, DO [facility] [address] [place], [place] [address] [phone] (office)

## 2024-04-22 ENCOUNTER — Other Ambulatory Visit: Payer: Self-pay | Admitting: "Endocrinology

## 2024-05-02 ENCOUNTER — Encounter: Payer: Self-pay | Admitting: *Deleted

## 2024-05-02 ENCOUNTER — Telehealth: Payer: Self-pay | Admitting: *Deleted

## 2024-05-02 ENCOUNTER — Ambulatory Visit: Admitting: Internal Medicine

## 2024-05-02 VITALS — BP 130/84 | HR 74 | Temp 97.2°F | Ht 67.5 in | Wt 299.8 lb

## 2024-05-02 DIAGNOSIS — R1310 Dysphagia, unspecified: Secondary | ICD-10-CM | POA: Diagnosis not present

## 2024-05-02 DIAGNOSIS — K219 Gastro-esophageal reflux disease without esophagitis: Secondary | ICD-10-CM | POA: Diagnosis not present

## 2024-05-02 DIAGNOSIS — K5904 Chronic idiopathic constipation: Secondary | ICD-10-CM | POA: Diagnosis not present

## 2024-05-02 DIAGNOSIS — R1319 Other dysphagia: Secondary | ICD-10-CM

## 2024-05-02 MED ORDER — PANTOPRAZOLE SODIUM 40 MG PO TBEC: 40.0000 mg | DELAYED_RELEASE_TABLET | Freq: Every day | ORAL | 11 refills | Status: DC

## 2024-05-02 NOTE — H&P (View-Only) (Signed)
 Primary Care Physician:  Dow Longs, PA-C Primary Gastroenterologist:  Dr. Cindie  Chief Complaint  Patient presents with   New Patient (Initial Visit)    Pt referred for dysphagia    HPI:   Judy Nelson is a 38 y.o. female who presents to clinic today by referral from her general surgeon Dr. Evonnie for evaluation.  Chronic GERD, esophageal dysphagia: Patient reports for the last 2 to 3 weeks she has had progressively worsening esophageal dysphagia.  Feels like food gets stuck in her substernal region.  Eventually passes.  Sometimes she has to hit her chest to make it go down.  No prior upper endoscopy.  Does have allergic rhinitis seasonally.  Also reports a touch of asthma.  Has been on antibiotics recently for abdominal infection status post debridement.  She thought her worsening symptoms were due to Ozempic .  Stopped this 2 weeks ago, minimal improvement in her symptoms.  Currently taking OTC Prilosec, and Gas-X.  Chronic constipation colon issue for many years.  Currently taking MiraLAX  2 capfuls daily.  Some improvement.  Previously on Dulcolax which caused abdominal cramping.  States sometimes she will go a week without a bowel movement.  Past Medical History:  Diagnosis Date   Anemia    Anxiety    Asthma    CSF leak    Depression    GERD (gastroesophageal reflux disease)    Heartburn 09/01/2015   no current med.   History of anemia 01/2014   Hypertension    Non-insulin  dependent type 2 diabetes mellitus (HCC)    Obesity    PTSD (post-traumatic stress disorder)    Scar of breast 08/2015   left   Sinus headache     Past Surgical History:  Procedure Laterality Date   BREAST SURGERY Bilateral    bilateral breast surgery to remove cyst at [facility] 2017   CRANIOTOMY N/A 06/30/2017   Procedure: BIFRONTAL CRANIOTOMY;  Surgeon: Lanis Pupa, MD;  Location: [facility] OR;  Service: Neurosurgery;  Laterality: N/A;  BIFRONTAL CRANIOTOMY Repair of Encephalocele    GANGLION CYST EXCISION Left 02/07/2023   Procedure: REMOVAL GANGLION OF WRIST;  Surgeon: Margrette Taft BRAVO, MD;  Location: [facility];  Service: Orthopedics;  Laterality: Left;  LMA   INCISION AND DRAINAGE ABSCESS Left 10/07/2014   Procedure: INCISION AND DRAINAGE LEFT BREAST ABSCESS;  Surgeon: Oneil DELENA Budge, MD;  Location: [facility];  Service: General;  Laterality: Left;   INCISION AND DRAINAGE ABSCESS N/A 01/19/2024   Procedure: INCISION AND DRAINAGE ABDOMINAL WALL ABSCESS;  Surgeon: Evonnie Dorothyann DELENA, DO;  Location: [facility];  Service: General;  Laterality: N/A;   LAPAROSCOPIC OVARIAN CYSTECTOMY Right 02/05/2014   Procedure: LAPAROSCOPIC right retroperitoneal (NOT OVARIAN) CYSTECTOMY;  Surgeon: Vonn VEAR Inch, MD;  Location: [facility];  Service: Gynecology;  Laterality: Right;   MASS EXCISION Left 09/07/2015   Procedure: Minor SCAR EXCISION LEFT BREAST, PLASTIC CLOSURE;  Surgeon: Elna Pick, MD;  Location: [facility];  Service: Plastics;  Laterality: Left;   TYMPANOSTOMY TUBE PLACEMENT Bilateral 10/17/2014   WOUND DEBRIDEMENT N/A 01/19/2024   Procedure: DEBRIDEMENT OF ABDOMINAL WALL NECROTIZING SOFT TISSUE INFECTION, 25 X 20 X 8 CM;  Surgeon: Evonnie Dorothyann DELENA, DO;  Location: [facility];  Service: General;  Laterality: N/A;    Current Outpatient Medications  Medication Sig Dispense Refill   albuterol  (PROVENTIL  HFA;VENTOLIN  HFA) 108 (90 Base) MCG/ACT inhaler Inhale 2 puffs into the lungs every 6 (six) hours as needed for wheezing or shortness  of breath.     ALPRAZolam  (XANAX ) 1 MG tablet TAKE 1 TABLET BY MOUTH THREE TIMES DAILY 90 tablet 5   amphetamine-dextroamphetamine (ADDERALL) 30 MG tablet Take 15 mg by mouth daily as needed (focusing).     Bioflavonoid Products (VITAMIN C) CHEW Chew 2 tablets by mouth daily.     clindamycin  (CLEOCIN  T) 1 % SWAB Apply 1 each topically 2 (two) times daily.     clindamycin  (CLINDAGEL) 1 % gel Apply topically 2 (two) times daily. 30 g 11    collagenase  (SANTYL ) 250 UNIT/GM ointment Apply to abdominal wound during wound vac changes Q 3days. 90 g 0   Continuous Glucose Receiver (DEXCOM G7 RECEIVER) DEVI Use to monitor BG continuously 1 each 0   Continuous Glucose Sensor (DEXCOM G7 SENSOR) MISC CHANGE SENSOR EVERY 10 DAYS 3 each 2   doxycycline  (VIBRAMYCIN ) 100 MG capsule Take 100 mg by mouth 2 (two) times daily.     DULoxetine  (CYMBALTA ) 60 MG capsule Take 60 mg by mouth 2 (two) times daily.     Emollient (GOLD BOND DIABETICS DRY SKIN) CREA Apply 1 Application topically daily as needed (pain). Uses with aspercreme     fluticasone  (FLONASE ) 50 MCG/ACT nasal spray Place 2 sprays into both nostrils daily. 16 g 0   gabapentin  (NEURONTIN ) 800 MG tablet Take 800 mg by mouth 2 (two) times daily.     hydrOXYzine  (ATARAX ) 50 MG tablet Take 100 mg by mouth at bedtime.     ibuprofen  (ADVIL ) 800 MG tablet Take 1 tablet (800 mg total) by mouth every 8 (eight) hours as needed. 90 tablet 1   insulin  aspart (NOVOLOG  FLEXPEN) 100 UNIT/ML FlexPen Inject 10-16 Units into the skin 3 (three) times daily before meals. 30 mL 1   insulin  glargine (LANTUS ) 100 UNIT/ML injection Inject 80 Units into the skin at bedtime.     losartan (COZAAR) 25 MG tablet Take 25 mg by mouth daily.     Multiple Vitamin (MULTIVITAMIN) tablet Take 1 tablet by mouth daily.     mupirocin  ointment (BACTROBAN ) 2 % Apply to abdominal wound during wound vac changes Q 3days. 90 g 1   ondansetron  (ZOFRAN ) 4 MG tablet Take 1 tablet (4 mg total) by mouth daily as needed for nausea or vomiting. 30 tablet 1   ondansetron  (ZOFRAN -ODT) 8 MG disintegrating tablet Take 8 mg by mouth every 8 (eight) hours as needed for nausea or vomiting.     oxyCODONE -acetaminophen  (PERCOCET) 10-325 MG tablet Take 1 tablet by mouth every 4 (four) hours as needed for pain.     OZEMPIC , 0.25 OR 0.5 MG/DOSE, 2 MG/3ML SOPN Inject 0.5 mg into the skin once a week. 6 mL 1   Phenylephrine -Acetaminophen  (TYLENOL   SINUS+HEADACHE PO) Take 2 tablets by mouth 2 (two) times daily as needed (sinus pain).     polyethylene glycol (MIRALAX  / GLYCOLAX ) 17 g packet Take 17 g by mouth daily. 14 each 0   rosuvastatin  (CRESTOR ) 10 MG tablet Take 1 tablet (10 mg total) by mouth daily. 90 tablet 1   silver  sulfADIAZINE  (SILVADENE ) 1 % cream Use to area 2-3 times daily 50 g 11   SODIUM FLUORIDE 5000 PPM 1.1 % PSTE BRUSH WITH A PEA SIZED AMOUNT FOR 2 MINS. THEN SPIT OUT THOROUGHLY TWICE DAILY. DO NOT RINSE. NOTHING BY MOUTH FOR 30 MINS.     solifenacin  (VESICARE ) 10 MG tablet Take 1 tablet (10 mg total) by mouth daily. (Patient not taking: Reported on 05/02/2024) 90 tablet 3  Varenicline Tartrate, Starter, 0.5 MG X 11 & 1 MG X 42 TBPK Take by mouth as directed. Starting pack (Patient not taking: Reported on 05/02/2024)     No current facility-administered medications for this visit.    Allergies as of 05/02/2024   (No Known Allergies)    Family History  Problem Relation Age of Onset   Diabetes Father    Hypertension Father    Heart disease Daughter        heart murmur   Cancer Maternal Grandmother        ovarian cancer   Obesity Paternal Grandmother    Diabetes Paternal Grandfather     Social History   Socioeconomic History   Marital status: Single    Spouse name: Not on file   Number of children: Not on file   Years of education: Not on file   Highest education level: Not on file  Occupational History   Not on file  Tobacco Use   Smoking status: Every Day    Current packs/day: 0.50    Average packs/day: 0.5 packs/day for 7.0 years (3.5 ttl pk-yrs)    Types: Cigarettes   Smokeless tobacco: Never  Vaping Use   Vaping status: Never Used  Substance and Sexual Activity   Alcohol use: Not Currently   Drug use: Yes    Types: Marijuana    Comment: THC gummies   Sexual activity: Yes    Birth control/protection: None  Other Topics Concern   Not on file  Social History Narrative   Not on file    Social Drivers of Health   Financial Resource Strain: Not on file  Food Insecurity: No Food Insecurity (01/19/2024)   Hunger Vital Sign    Worried About Running Out of Food in the Last Year: Never true    Ran Out of Food in the Last Year: Never true  Transportation Needs: No Transportation Needs (01/19/2024)   PRAPARE - Administrator, Civil Service (Medical): No    Lack of Transportation (Non-Medical): No  Physical Activity: Not on file  Stress: Not on file  Social Connections: Not on file  Intimate Partner Violence: Not At Risk (01/19/2024)   Humiliation, Afraid, Rape, and Kick questionnaire    Fear of Current or Ex-Partner: No    Emotionally Abused: No    Physically Abused: No    Sexually Abused: No    Subjective: Review of Systems  Constitutional:  Negative for chills and fever.  HENT:  Negative for congestion and hearing loss.   Eyes:  Negative for blurred vision and double vision.  Respiratory:  Negative for cough and shortness of breath.   Cardiovascular:  Negative for chest pain and palpitations.  Gastrointestinal:  Positive for constipation and heartburn. Negative for abdominal pain, blood in stool, diarrhea, melena and vomiting.       Dysphagia  Genitourinary:  Negative for dysuria and urgency.  Musculoskeletal:  Negative for joint pain and myalgias.  Skin:  Negative for itching and rash.  Neurological:  Negative for dizziness and headaches.  Psychiatric/Behavioral:  Negative for depression. The patient is not nervous/anxious.        Objective: BP 130/84   Pulse 74   Temp (!) 97.2 F (36.2 C)   Ht 5' 7.5 (1.715 m)   Wt 299 lb 12.8 oz (136 kg)   LMP 05/02/2024   BMI 46.26 kg/m  Physical Exam Constitutional:      Appearance: Normal appearance. She is obese.  HENT:  Head: Normocephalic and atraumatic.  Eyes:     Extraocular Movements: Extraocular movements intact.     Conjunctiva/sclera: Conjunctivae normal.  Cardiovascular:     Rate and  Rhythm: Normal rate and regular rhythm.  Pulmonary:     Effort: Pulmonary effort is normal.     Breath sounds: Normal breath sounds.  Abdominal:     General: Bowel sounds are normal.     Palpations: Abdomen is soft.  Musculoskeletal:        General: No swelling. Normal range of motion.     Cervical back: Normal range of motion and neck supple.  Skin:    General: Skin is warm and dry.     Coloration: Skin is not jaundiced.  Neurological:     General: No focal deficit present.     Mental Status: She is alert and oriented to person, place, and time.  Psychiatric:        Mood and Affect: Mood normal.        Behavior: Behavior normal.      Assessment/Plan: 1.  Chronic GERD, esophageal dysphagia-Will schedule for EGD with possible dilation to evaluate for peptic ulcer disease, esophagitis, gastritis, H. Pylori, duodenitis, or other. Will also evaluate for esophageal stricture, Schatzki's ring, esophageal web or other.   The risks including infection, bleed, or perforation as well as benefits, limitations, alternatives and imponderables have been reviewed with the patient. Potential for esophageal dilation, biopsy, etc. have also been reviewed.  Questions have been answered. All parties agreeable.  Start pantoprazole  40 mg daily. Sent to pharmacy.   2.  Chronic idiopathic constipation-symptoms not well-controlled on MiraLAX  2 capfuls daily.  Tried Dulcolax in the past which caused stomach cramping.  Samples of Linzess 145 mcg daily provided to patient today.  Call with update in 1 week and I will send in formal prescription if improved.  Counseled [place] to increase or decrease dose.  How she responds.  Counseled on initial washout.  She understands.  Thank you Dr. Evonnie for the kind referral.   05/02/2024 9:22 AM   Disclaimer: This note was dictated with voice recognition software. Similar sounding words can inadvertently be transcribed and may not be corrected upon review.

## 2024-05-02 NOTE — Progress Notes (Signed)
 Primary Care Physician:  Dow Longs, PA-C Primary Gastroenterologist:  Dr. Cindie  Chief Complaint  Patient presents with   New Patient (Initial Visit)    Pt referred for dysphagia    HPI:   Judy Nelson is a 38 y.o. female who presents to clinic today by referral from her general surgeon Dr. Evonnie for evaluation.  Chronic GERD, esophageal dysphagia: Patient reports for the last 2 to 3 weeks she has had progressively worsening esophageal dysphagia.  Feels like food gets stuck in her substernal region.  Eventually passes.  Sometimes she has to hit her chest to make it go down.  No prior upper endoscopy.  Does have allergic rhinitis seasonally.  Also reports a touch of asthma.  Has been on antibiotics recently for abdominal infection status post debridement.  She thought her worsening symptoms were due to Ozempic .  Stopped this 2 weeks ago, minimal improvement in her symptoms.  Currently taking OTC Prilosec, and Gas-X.  Chronic constipation colon issue for many years.  Currently taking MiraLAX  2 capfuls daily.  Some improvement.  Previously on Dulcolax which caused abdominal cramping.  States sometimes she will go a week without a bowel movement.  Past Medical History:  Diagnosis Date   Anemia    Anxiety    Asthma    CSF leak    Depression    GERD (gastroesophageal reflux disease)    Heartburn 09/01/2015   no current med.   History of anemia 01/2014   Hypertension    Non-insulin  dependent type 2 diabetes mellitus (HCC)    Obesity    PTSD (post-traumatic stress disorder)    Scar of breast 08/2015   left   Sinus headache     Past Surgical History:  Procedure Laterality Date   BREAST SURGERY Bilateral    bilateral breast surgery to remove cyst at [facility] 2017   CRANIOTOMY N/A 06/30/2017   Procedure: BIFRONTAL CRANIOTOMY;  Surgeon: Lanis Pupa, MD;  Location: [facility] OR;  Service: Neurosurgery;  Laterality: N/A;  BIFRONTAL CRANIOTOMY Repair of Encephalocele    GANGLION CYST EXCISION Left 02/07/2023   Procedure: REMOVAL GANGLION OF WRIST;  Surgeon: Margrette Taft BRAVO, MD;  Location: [facility] ORS;  Service: Orthopedics;  Laterality: Left;  LMA   INCISION AND DRAINAGE ABSCESS Left 10/07/2014   Procedure: INCISION AND DRAINAGE LEFT BREAST ABSCESS;  Surgeon: Oneil DELENA Budge, MD;  Location: [facility];  Service: General;  Laterality: Left;   INCISION AND DRAINAGE ABSCESS N/A 01/19/2024   Procedure: INCISION AND DRAINAGE ABDOMINAL WALL ABSCESS;  Surgeon: Evonnie Dorothyann DELENA, DO;  Location: [facility];  Service: General;  Laterality: N/A;   LAPAROSCOPIC OVARIAN CYSTECTOMY Right 02/05/2014   Procedure: LAPAROSCOPIC right retroperitoneal (NOT OVARIAN) CYSTECTOMY;  Surgeon: Vonn VEAR Inch, MD;  Location: [facility];  Service: Gynecology;  Laterality: Right;   MASS EXCISION Left 09/07/2015   Procedure: Minor SCAR EXCISION LEFT BREAST, PLASTIC CLOSURE;  Surgeon: Elna Pick, MD;  Location: [facility];  Service: Plastics;  Laterality: Left;   TYMPANOSTOMY TUBE PLACEMENT Bilateral 10/17/2014   WOUND DEBRIDEMENT N/A 01/19/2024   Procedure: DEBRIDEMENT OF ABDOMINAL WALL NECROTIZING SOFT TISSUE INFECTION, 25 X 20 X 8 CM;  Surgeon: Evonnie Dorothyann DELENA, DO;  Location: [facility];  Service: General;  Laterality: N/A;    Current Outpatient Medications  Medication Sig Dispense Refill   albuterol  (PROVENTIL  HFA;VENTOLIN  HFA) 108 (90 Base) MCG/ACT inhaler Inhale 2 puffs into the lungs every 6 (six) hours as needed for wheezing or shortness  of breath.     ALPRAZolam  (XANAX ) 1 MG tablet TAKE 1 TABLET BY MOUTH THREE TIMES DAILY 90 tablet 5   amphetamine-dextroamphetamine (ADDERALL) 30 MG tablet Take 15 mg by mouth daily as needed (focusing).     Bioflavonoid Products (VITAMIN C) CHEW Chew 2 tablets by mouth daily.     clindamycin  (CLEOCIN  T) 1 % SWAB Apply 1 each topically 2 (two) times daily.     clindamycin  (CLINDAGEL) 1 % gel Apply topically 2 (two) times daily. 30 g 11    collagenase  (SANTYL ) 250 UNIT/GM ointment Apply to abdominal wound during wound vac changes Q 3days. 90 g 0   Continuous Glucose Receiver (DEXCOM G7 RECEIVER) DEVI Use to monitor BG continuously 1 each 0   Continuous Glucose Sensor (DEXCOM G7 SENSOR) MISC CHANGE SENSOR EVERY 10 DAYS 3 each 2   doxycycline  (VIBRAMYCIN ) 100 MG capsule Take 100 mg by mouth 2 (two) times daily.     DULoxetine  (CYMBALTA ) 60 MG capsule Take 60 mg by mouth 2 (two) times daily.     Emollient (GOLD BOND DIABETICS DRY SKIN) CREA Apply 1 Application topically daily as needed (pain). Uses with aspercreme     fluticasone  (FLONASE ) 50 MCG/ACT nasal spray Place 2 sprays into both nostrils daily. 16 g 0   gabapentin  (NEURONTIN ) 800 MG tablet Take 800 mg by mouth 2 (two) times daily.     hydrOXYzine  (ATARAX ) 50 MG tablet Take 100 mg by mouth at bedtime.     ibuprofen  (ADVIL ) 800 MG tablet Take 1 tablet (800 mg total) by mouth every 8 (eight) hours as needed. 90 tablet 1   insulin  aspart (NOVOLOG  FLEXPEN) 100 UNIT/ML FlexPen Inject 10-16 Units into the skin 3 (three) times daily before meals. 30 mL 1   insulin  glargine (LANTUS ) 100 UNIT/ML injection Inject 80 Units into the skin at bedtime.     losartan (COZAAR) 25 MG tablet Take 25 mg by mouth daily.     Multiple Vitamin (MULTIVITAMIN) tablet Take 1 tablet by mouth daily.     mupirocin  ointment (BACTROBAN ) 2 % Apply to abdominal wound during wound vac changes Q 3days. 90 g 1   ondansetron  (ZOFRAN ) 4 MG tablet Take 1 tablet (4 mg total) by mouth daily as needed for nausea or vomiting. 30 tablet 1   ondansetron  (ZOFRAN -ODT) 8 MG disintegrating tablet Take 8 mg by mouth every 8 (eight) hours as needed for nausea or vomiting.     oxyCODONE -acetaminophen  (PERCOCET) 10-325 MG tablet Take 1 tablet by mouth every 4 (four) hours as needed for pain.     OZEMPIC , 0.25 OR 0.5 MG/DOSE, 2 MG/3ML SOPN Inject 0.5 mg into the skin once a week. 6 mL 1   Phenylephrine -Acetaminophen  (TYLENOL   SINUS+HEADACHE PO) Take 2 tablets by mouth 2 (two) times daily as needed (sinus pain).     polyethylene glycol (MIRALAX  / GLYCOLAX ) 17 g packet Take 17 g by mouth daily. 14 each 0   rosuvastatin  (CRESTOR ) 10 MG tablet Take 1 tablet (10 mg total) by mouth daily. 90 tablet 1   silver  sulfADIAZINE  (SILVADENE ) 1 % cream Use to area 2-3 times daily 50 g 11   SODIUM FLUORIDE 5000 PPM 1.1 % PSTE BRUSH WITH A PEA SIZED AMOUNT FOR 2 MINS. THEN SPIT OUT THOROUGHLY TWICE DAILY. DO NOT RINSE. NOTHING BY MOUTH FOR 30 MINS.     solifenacin  (VESICARE ) 10 MG tablet Take 1 tablet (10 mg total) by mouth daily. (Patient not taking: Reported on 05/02/2024) 90 tablet 3  Varenicline Tartrate, Starter, 0.5 MG X 11 & 1 MG X 42 TBPK Take by mouth as directed. Starting pack (Patient not taking: Reported on 05/02/2024)     No current facility-administered medications for this visit.    Allergies as of 05/02/2024   (No Known Allergies)    Family History  Problem Relation Age of Onset   Diabetes Father    Hypertension Father    Heart disease Daughter        heart murmur   Cancer Maternal Grandmother        ovarian cancer   Obesity Paternal Grandmother    Diabetes Paternal Grandfather     Social History   Socioeconomic History   Marital status: Single    Spouse name: Not on file   Number of children: Not on file   Years of education: Not on file   Highest education level: Not on file  Occupational History   Not on file  Tobacco Use   Smoking status: Every Day    Current packs/day: 0.50    Average packs/day: 0.5 packs/day for 7.0 years (3.5 ttl pk-yrs)    Types: Cigarettes   Smokeless tobacco: Never  Vaping Use   Vaping status: Never Used  Substance and Sexual Activity   Alcohol use: Not Currently   Drug use: Yes    Types: Marijuana    Comment: THC gummies   Sexual activity: Yes    Birth control/protection: None  Other Topics Concern   Not on file  Social History Narrative   Not on file    Social Drivers of Health   Financial Resource Strain: Not on file  Food Insecurity: No Food Insecurity (01/19/2024)   Hunger Vital Sign    Worried About Running Out of Food in the Last Year: Never true    Ran Out of Food in the Last Year: Never true  Transportation Needs: No Transportation Needs (01/19/2024)   PRAPARE - Administrator, Civil Service (Medical): No    Lack of Transportation (Non-Medical): No  Physical Activity: Not on file  Stress: Not on file  Social Connections: Not on file  Intimate Partner Violence: Not At Risk (01/19/2024)   Humiliation, Afraid, Rape, and Kick questionnaire    Fear of Current or Ex-Partner: No    Emotionally Abused: No    Physically Abused: No    Sexually Abused: No    Subjective: Review of Systems  Constitutional:  Negative for chills and fever.  HENT:  Negative for congestion and hearing loss.   Eyes:  Negative for blurred vision and double vision.  Respiratory:  Negative for cough and shortness of breath.   Cardiovascular:  Negative for chest pain and palpitations.  Gastrointestinal:  Positive for constipation and heartburn. Negative for abdominal pain, blood in stool, diarrhea, melena and vomiting.       Dysphagia  Genitourinary:  Negative for dysuria and urgency.  Musculoskeletal:  Negative for joint pain and myalgias.  Skin:  Negative for itching and rash.  Neurological:  Negative for dizziness and headaches.  Psychiatric/Behavioral:  Negative for depression. The patient is not nervous/anxious.        Objective: BP 130/84   Pulse 74   Temp (!) 97.2 F (36.2 C)   Ht 5' 7.5 (1.715 m)   Wt 299 lb 12.8 oz (136 kg)   LMP 05/02/2024   BMI 46.26 kg/m  Physical Exam Constitutional:      Appearance: Normal appearance. She is obese.  HENT:  Head: Normocephalic and atraumatic.  Eyes:     Extraocular Movements: Extraocular movements intact.     Conjunctiva/sclera: Conjunctivae normal.  Cardiovascular:     Rate and  Rhythm: Normal rate and regular rhythm.  Pulmonary:     Effort: Pulmonary effort is normal.     Breath sounds: Normal breath sounds.  Abdominal:     General: Bowel sounds are normal.     Palpations: Abdomen is soft.  Musculoskeletal:        General: No swelling. Normal range of motion.     Cervical back: Normal range of motion and neck supple.  Skin:    General: Skin is warm and dry.     Coloration: Skin is not jaundiced.  Neurological:     General: No focal deficit present.     Mental Status: She is alert and oriented to person, place, and time.  Psychiatric:        Mood and Affect: Mood normal.        Behavior: Behavior normal.      Assessment/Plan: 1.  Chronic GERD, esophageal dysphagia-Will schedule for EGD with possible dilation to evaluate for peptic ulcer disease, esophagitis, gastritis, H. Pylori, duodenitis, or other. Will also evaluate for esophageal stricture, Schatzki's ring, esophageal web or other.   The risks including infection, bleed, or perforation as well as benefits, limitations, alternatives and imponderables have been reviewed with the patient. Potential for esophageal dilation, biopsy, etc. have also been reviewed.  Questions have been answered. All parties agreeable.  Start pantoprazole  40 mg daily. Sent to pharmacy.   2.  Chronic idiopathic constipation-symptoms not well-controlled on MiraLAX  2 capfuls daily.  Tried Dulcolax in the past which caused stomach cramping.  Samples of Linzess 145 mcg daily provided to patient today.  Call with update in 1 week and I will send in formal prescription if improved.  Counseled [place] to increase or decrease dose.  How she responds.  Counseled on initial washout.  She understands.  Thank you Dr. Evonnie for the kind referral.   05/02/2024 9:22 AM   Disclaimer: This note was dictated with voice recognition software. Similar sounding words can inadvertently be transcribed and may not be corrected upon review.

## 2024-05-02 NOTE — Telephone Encounter (Signed)
 PA approved via [facility] [id number] DOS: May 15, 2024 - Aug 13, 2024

## 2024-05-02 NOTE — Patient Instructions (Signed)
 Will schedule you for upper endoscopy to further evaluate your acid reflux as well as difficulty with food getting stuck.  I may like to stretch your esophagus pending on findings.  I am going to start you on pantoprazole  40 mg daily.  This medication works best if you take at least 30 minutes before breakfast.  For your constipation I am going to give you samples of Linzess 145 mcg daily.  We have room to increase or decrease dose depending on how you respond.  Let me know in a week how you are doing and I can send in formal prescription if improved.    Linzess works best when taken once a day every day, on an empty stomach, at least 30 minutes before your first meal of the day.  When Linzess is taken daily as directed:  *Constipation relief is typically felt in about a week *IBS-C patients may begin to experience relief from belly pain and overall abdominal symptoms (pain, discomfort, and bloating) in about 1 week,   with symptoms typically improving over 12 weeks.  Diarrhea may occur in the first 2 weeks -keep taking it.  The diarrhea should go away and you should start having normal, complete, full bowel movements. It may be helpful to start treatment when you can be near the comfort of your own bathroom, such as a weekend.   It was very nice meeting you today.  Dr. Cindie

## 2024-05-09 ENCOUNTER — Encounter: Payer: Self-pay | Admitting: Surgery

## 2024-05-09 ENCOUNTER — Ambulatory Visit: Admitting: Surgery

## 2024-05-09 VITALS — BP 142/101 | HR 103 | Temp 98.3°F | Resp 16 | Ht 67.5 in | Wt 298.0 lb

## 2024-05-09 DIAGNOSIS — S31109D Unspecified open wound of abdominal wall, unspecified quadrant without penetration into peritoneal cavity, subsequent encounter: Secondary | ICD-10-CM | POA: Diagnosis not present

## 2024-05-09 NOTE — Patient Instructions (Signed)
 Judy Nelson  05/09/2024     @PREFPERIOPPHARMACY @   Your procedure is scheduled on  05/16/2023.   Report to [facility] at  1045 A.M.   Call this number if you have problems the morning of surgery:  [phone]  If you experience any cold or flu symptoms such as cough, fever, chills, shortness of breath, etc. between now and your scheduled surgery, please notify us  at the above number.   Remember:         Your last dose of Ozempic  should be on 05/07/2024.          Take 1/2 of your night time insulin  the night before your procedure.         DO NOT take any medications for diabetes the morning of your procedure.       Use your inhalers before you come and bring your rescue inhaler with you.   Follow the diet instructions given to you by the office.   You may drink clear liquids until  0845 am on 05/15/2024.    Clear liquids allowed are:                    Water, Juice (No red color; non-citric and without pulp; diabetics please choose diet or no sugar options), Carbonated beverages (diabetics please choose diet or no sugar options), Clear Tea (No creamer, milk, or cream, including half & half and powdered creamer), Black Coffee Only (No creamer, milk or cream, including half & half and powdered creamer), and Clear Sports drink (No red color; diabetics please choose diet or no sugar options)    Take these medicines the morning of surgery with A SIP OF WATER       alprazolam  (if needed), duloxetine , gabapentin , oxycodone (if needed), pantoprzole, zofran  (if needed).    Do not wear jewelry, make-up or nail polish, including gel polish,  artificial nails, or any other type of covering on natural nails (fingers and  toes).  Do not wear lotions, powders, or perfumes, or deodorant.  Do not shave 48 hours prior to surgery.  Men may shave face and neck.  Do not bring valuables to the hospital.  [facility] is not responsible for any belongings or valuables.  Contacts,  dentures or bridgework may not be worn into surgery.  Leave your suitcase in the car.  After surgery it may be brought to your room.  For patients admitted to the hospital, discharge time will be determined by your treatment team.  Patients discharged the day of surgery will not be allowed to drive home and must have someone with them for 24 hours.    Special instructions:   DO NOT smoke tobacco or vape for 24 hours before your procedure.  Please read over the following fact sheets that you were given. Coughing and Deep Breathing, Anesthesia Post-op Instructions, and Care and Recovery After Surgery      Upper Endoscopy, Adult, Care After After the procedure, it is common to have a sore throat. It is also common to have: Mild stomach pain or discomfort. Bloating. Nausea. Follow these instructions at home: The instructions below may help you care for yourself at home. Your health care provider may give you more instructions. If you have questions, ask your health care provider. If you were given a sedative during the procedure, it can affect you for several hours. Do not drive or operate machinery until your health care provider says that  it is safe. If you will be going home right after the procedure, plan to have a responsible adult: Take you home from the hospital or clinic. You will not be allowed to drive. Care for you for the time you are told. Follow instructions from your health care provider about what you may eat and drink. Return to your normal activities as told by your health care provider. Ask your health care provider what activities are safe for you. Take over-the-counter and prescription medicines only as told by your health care provider. Contact a health care provider if you: Have a sore throat that lasts longer than one day. Have trouble swallowing. Have a fever. Get help right away if you: Vomit blood or your vomit looks like coffee grounds. Have bloody, black, or  tarry stools. Have a very bad sore throat or you cannot swallow. Have difficulty breathing or very bad pain in your chest or abdomen. These symptoms may be an emergency. Get help right away. Call 911. Do not wait to see if the symptoms will go away. Do not drive yourself to the hospital. Summary After the procedure, it is common to have a sore throat, mild stomach discomfort, bloating, and nausea. If you were given a sedative during the procedure, it can affect you for several hours. Do not drive until your health care provider says that it is safe. Follow instructions from your health care provider about what you may eat and drink. Return to your normal activities as told by your health care provider. This information is not intended to replace advice given to you by your health care provider. Make sure you discuss any questions you have with your health care provider. Document Revised: 01/12/2022 Document Reviewed: 01/12/2022 Elsevier Patient Education  2024 [employer]Esophageal Dilatation Esophageal dilatation, or dilation, is done to stretch a blocked or narrowed part of your esophagus. The esophagus is the part of your body that moves food from your mouth to your stomach. You may need to have it stretched if: You have a lot of scar tissue and it makes it hard or painful to swallow. You have cancer of the esophagus. There's a problem with how food moves through your esophagus. In some cases, you may need to have this procedure done more than once. Tell a health care provider about: Any allergies you have. All medicines you're taking, including vitamins, herbs, eye drops, creams, and over-the-counter medicines. Any problems you or family members have had with anesthesia. Any bleeding problems you have. Any surgeries you've had. Any medical conditions you have. Whether you're pregnant or may be pregnant. What are the risks? Your health care provider will talk with you about risks.  These may include: Bleeding. A hole or tear in your esophagus. What happens before the procedure? When to stop eating and drinking Follow instructions from your provider about what you may eat and drink. These may include: 8 hours before your procedure Stop eating most foods. Do not eat meat, fried foods, or fatty foods. Eat only light foods, such as toast or crackers. All liquids are okay except energy drinks and alcohol. 6 hours before your procedure Stop eating. Drink only clear liquids, such as water, clear fruit juice, black coffee, plain tea, and sports drinks. Do not drink energy drinks or alcohol. 2 hours before your procedure Stop drinking all liquids. You may be allowed to take medicines with small sips of water. If you don't follow your provider's instructions, your procedure may be delayed or  canceled. Medicines Ask your provider about: Changing or stopping your regular medicines. These include any diabetes medicines or blood thinners you take. Taking medicines such as aspirin and ibuprofen . These medicines can thin your blood. Do not take them unless your provider tells you to. Taking over-the-counter medicines, vitamins, herbs, and supplements. General instructions If you'll be going home right after the procedure, plan to have a responsible adult: Take you home from the hospital or clinic. You won't be allowed to drive. Care for you for the time you're told. What happens during the procedure? You may be given: A sedative. This helps you relax. Anesthesia. This keeps you from feeling pain. It will numb certain areas of your body. The stretching may be done with: Simple dilators. These are tools put in your esophagus to stretch it. Guide wires. These wires are put in using a tube called an endoscope. A dilator is put over the wires to stretch your esophagus. Then the wires are taken out. A balloon. The balloon is on the end of a tube. It's inflated to stretch your  esophagus. The procedure may vary among providers and hospitals. What can I expect after the procedure? Your blood pressure, heart rate, breathing rate, and blood oxygen level will be monitored until you leave the hospital or clinic. Your throat may feel sore and numb. This will get better over time. You won't be allowed to eat or drink until your throat is no longer numb. You may be able to go home when you can: Drink. Pee. Sit on the edge of the bed without nausea or dizziness. Follow these instructions at home: Activity If you were given a sedative during the procedure, it can affect you for several hours. Do not drive or operate machinery until your provider says it's safe. Return to your normal activities as told by your provider. Ask your provider what activities are safe for you. General instructions Take over-the-counter and prescription medicines only as told by your provider. Follow instructions from your provider about what you may eat and drink. Do not use any products that contain nicotine  or tobacco. These products include cigarettes, chewing tobacco, and vaping devices, such as e-cigarettes. If you need help quitting, ask your provider. Keep all follow-up visits. Your provider will make sure the procedure worked. Where to find more information American Society for Gastrointestinal Endoscopy (ASGE): asge.org Contact a health care provider if: You have trouble swallowing. You have a fever. Your pain doesn't get better with medicine. Get help right away if: You have chest pain. You have trouble breathing. You vomit blood. Your poop is: Black. Tarry. Bloody. These symptoms may be an emergency. Get help right away. Call 911. Do not wait to see if the symptoms will go away. Do not drive yourself to the hospital. This information is not intended to replace advice given to you by your health care provider. Make sure you discuss any questions you have with your health care  provider. Document Revised: 12/30/2022 Document Reviewed: 12/30/2022 Elsevier Patient Education  2024 Elsevier Inc.General Anesthesia, Adult, Care After The following information offers guidance on how to care for yourself after your procedure. Your health care provider may also give you more specific instructions. If you have problems or questions, contact your health care provider. What can I expect after the procedure? After the procedure, it is common for people to: Have pain or discomfort at the IV site. Have nausea or vomiting. Have a sore throat or hoarseness. Have trouble concentrating. Feel  cold or chills. Feel weak, sleepy, or tired (fatigue). Have soreness and body aches. These can affect parts of the body that were not involved in surgery. Follow these instructions at home: For the time period you were told by your health care provider:  Rest. Do not participate in activities where you could fall or become injured. Do not drive or use machinery. Do not drink alcohol. Do not take sleeping pills or medicines that cause drowsiness. Do not make important decisions or sign legal documents. Do not take care of children on your own. General instructions Drink enough fluid to keep your urine pale yellow. If you have sleep apnea, surgery and certain medicines can increase your risk for breathing problems. Follow instructions from your health care provider about wearing your sleep device: Anytime you are sleeping, including during daytime naps. While taking prescription pain medicines, sleeping medicines, or medicines that make you drowsy. Return to your normal activities as told by your health care provider. Ask your health care provider what activities are safe for you. Take over-the-counter and prescription medicines only as told by your health care provider. Do not use any products that contain nicotine  or tobacco. These products include cigarettes, chewing tobacco, and vaping  devices, such as e-cigarettes. These can delay incision healing after surgery. If you need help quitting, ask your health care provider. Contact a health care provider if: You have nausea or vomiting that does not get better with medicine. You vomit every time you eat or drink. You have pain that does not get better with medicine. You cannot urinate or have bloody urine. You develop a skin rash. You have a fever. Get help right away if: You have trouble breathing. You have chest pain. You vomit blood. These symptoms may be an emergency. Get help right away. Call 911. Do not wait to see if the symptoms will go away. Do not drive yourself to the hospital. Summary After the procedure, it is common to have a sore throat, hoarseness, nausea, vomiting, or to feel weak, sleepy, or fatigue. For the time period you were told by your health care provider, do not drive or use machinery. Get help right away if you have difficulty breathing, have chest pain, or vomit blood. These symptoms may be an emergency. This information is not intended to replace advice given to you by your health care provider. Make sure you discuss any questions you have with your health care provider. Document Revised: 12/31/2021 Document Reviewed: 12/31/2021 Elsevier Patient Education  2024 [employer].

## 2024-05-09 NOTE — Progress Notes (Signed)
 [facility] Note   HPI:  38 y.o. Female presents to clinic for wound recheck status post incision, drainage, and debridement of abdominal wall necrotizing soft tissue infection on 4/4.  Since her last follow-up, she did stop performing wet-to-dry dressings, as her skin was getting irritated from the tape.  Since that time, the area has mostly been unchanged.  She denies any significant drainage or pain related to the area.  Denies fevers and chills.  Review of Systems:  All other review of systems: otherwise negative   Vital Signs:  BP (!) 142/101   Pulse (!) 103   Temp 98.3 F (36.8 C) (Oral)   Resp 16   Ht 5' 7.5 (1.715 m)   Wt 298 lb (135.2 kg)   LMP 05/02/2024   SpO2 95%   BMI 45.98 kg/m    Physical Exam:  Physical Exam Vitals reviewed.  Constitutional:      Appearance: Normal appearance.  Abdominal:     Comments: Abdomen soft, nondistended, no percussion tenderness, nontender to palpation; abdominal wall wound measuring 3 x 2 cm, with pink granulation tissue at the base, unchanged from previous visit  Neurological:     Mental Status: She is alert.     Laboratory studies: None  Imaging:  None  Assessment:  38 y.o. yo Female who presents for wound recheck status post incision, drainage, and debridement of abdominal wall necrotizing soft tissue infection on 4/4.  Plan:  - Area has remained same size since last visit, as patient was not performing any daily dressing changes secondary to irritation from the tape - Patient states that she is going to start wet-to-dry dressing changes again, given that the area failed to improve without dressing changes - Advised that if the area becomes significantly smaller, she can apply antibiotic ointment and keep the area covered - Follow up in 1 month  All of the above recommendations were discussed with the patient, and all of patient's questions were answered to her expressed satisfaction.  Note: Portions of  this report may have been transcribed using voice recognition software. Every effort has been made to ensure accuracy; however, inadvertent computerized transcription errors may still be present.   Dorothyann Brittle, DO [facility] [address] [place], [place] [address] [phone] (office)

## 2024-05-10 ENCOUNTER — Other Ambulatory Visit: Payer: Self-pay

## 2024-05-10 ENCOUNTER — Encounter (HOSPITAL_COMMUNITY)
Admission: RE | Admit: 2024-05-10 | Discharge: 2024-05-10 | Disposition: A | Discharge status: Home / Self Care | Source: Ambulatory Visit | Attending: Internal Medicine | Admitting: Internal Medicine

## 2024-05-10 ENCOUNTER — Encounter (HOSPITAL_COMMUNITY): Payer: Self-pay

## 2024-05-10 VITALS — BP 153/83 | HR 106 | Resp 18 | Ht 67.5 in | Wt 298.0 lb

## 2024-05-10 DIAGNOSIS — E1165 Type 2 diabetes mellitus with hyperglycemia: Secondary | ICD-10-CM | POA: Diagnosis not present

## 2024-05-10 DIAGNOSIS — I10 Essential (primary) hypertension: Secondary | ICD-10-CM | POA: Insufficient documentation

## 2024-05-10 DIAGNOSIS — E876 Hypokalemia: Secondary | ICD-10-CM | POA: Insufficient documentation

## 2024-05-10 DIAGNOSIS — Z720 Tobacco use: Secondary | ICD-10-CM | POA: Diagnosis not present

## 2024-05-10 DIAGNOSIS — Z794 Long term (current) use of insulin: Secondary | ICD-10-CM | POA: Insufficient documentation

## 2024-05-10 DIAGNOSIS — Z6841 Body Mass Index (BMI) 40.0 and over, adult: Secondary | ICD-10-CM | POA: Insufficient documentation

## 2024-05-10 DIAGNOSIS — D649 Anemia, unspecified: Secondary | ICD-10-CM | POA: Diagnosis not present

## 2024-05-10 DIAGNOSIS — Z01818 Encounter for other preprocedural examination: Secondary | ICD-10-CM | POA: Diagnosis present

## 2024-05-10 LAB — CBC WITH DIFFERENTIAL/PLATELET
Abs Immature Granulocytes: 0.04 K/uL (ref 0.00–0.07)
Basophils Absolute: 0.1 K/uL (ref 0.0–0.1)
Basophils Relative: 1 %
Eosinophils Absolute: 0.2 K/uL (ref 0.0–0.5)
Eosinophils Relative: 2 %
HCT: 37.7 % (ref 36.0–46.0)
Hemoglobin: 12.5 g/dL (ref 12.0–15.0)
Immature Granulocytes: 0 %
Lymphocytes Relative: 21 %
Lymphs Abs: 2 K/uL (ref 0.7–4.0)
MCH: 27.2 pg (ref 26.0–34.0)
MCHC: 33.2 g/dL (ref 30.0–36.0)
MCV: 82.1 fL (ref 80.0–100.0)
Monocytes Absolute: 0.6 K/uL (ref 0.1–1.0)
Monocytes Relative: 6 %
Neutro Abs: 6.7 K/uL (ref 1.7–7.7)
Neutrophils Relative %: 70 %
Platelets: 303 K/uL (ref 150–400)
RBC: 4.59 MIL/uL (ref 3.87–5.11)
RDW: 13.4 % (ref 11.5–15.5)
WBC: 9.6 K/uL (ref 4.0–10.5)
nRBC: 0 % (ref 0.0–0.2)

## 2024-05-10 LAB — BASIC METABOLIC PANEL WITH GFR
Anion gap: 13 (ref 5–15)
BUN: 5 mg/dL — ABNORMAL LOW (ref 6–20)
CO2: 23 mmol/L (ref 22–32)
Calcium: 9.2 mg/dL (ref 8.9–10.3)
Chloride: 99 mmol/L (ref 98–111)
Creatinine, Ser: 0.7 mg/dL (ref 0.44–1.00)
GFR, Estimated: >60 mL/min (ref >60)
Glucose, Bld: 400 mg/dL — ABNORMAL HIGH (ref 70–99)
Potassium: 3.6 mmol/L (ref 3.5–5.1)
Sodium: 135 mmol/L (ref 135–145)

## 2024-05-10 NOTE — Pre-Procedure Instructions (Signed)
 Glucose of 400 routed to Dr Cindie.

## 2024-05-15 ENCOUNTER — Encounter (HOSPITAL_COMMUNITY): Payer: Self-pay | Admitting: Internal Medicine

## 2024-05-15 ENCOUNTER — Ambulatory Visit (HOSPITAL_BASED_OUTPATIENT_CLINIC_OR_DEPARTMENT_OTHER): Payer: Self-pay | Admitting: Anesthesiology

## 2024-05-15 ENCOUNTER — Encounter (HOSPITAL_COMMUNITY)
Admission: RE | Disposition: A | Discharge status: Home / Self Care | Payer: Self-pay | Source: Home / Self Care | Attending: Internal Medicine

## 2024-05-15 ENCOUNTER — Ambulatory Visit (HOSPITAL_COMMUNITY): Payer: Self-pay | Admitting: Anesthesiology

## 2024-05-15 ENCOUNTER — Ambulatory Visit (HOSPITAL_COMMUNITY)
Admission: RE | Admit: 2024-05-15 | Discharge: 2024-05-15 | Disposition: A | Discharge status: Home / Self Care | Attending: Internal Medicine | Admitting: Internal Medicine

## 2024-05-15 DIAGNOSIS — E119 Type 2 diabetes mellitus without complications: Secondary | ICD-10-CM | POA: Diagnosis not present

## 2024-05-15 DIAGNOSIS — K319 Disease of stomach and duodenum, unspecified: Secondary | ICD-10-CM

## 2024-05-15 DIAGNOSIS — Z794 Long term (current) use of insulin: Secondary | ICD-10-CM | POA: Diagnosis not present

## 2024-05-15 DIAGNOSIS — F319 Bipolar disorder, unspecified: Secondary | ICD-10-CM | POA: Diagnosis not present

## 2024-05-15 DIAGNOSIS — K2100 Gastro-esophageal reflux disease with esophagitis, without bleeding: Secondary | ICD-10-CM | POA: Diagnosis not present

## 2024-05-15 DIAGNOSIS — F419 Anxiety disorder, unspecified: Secondary | ICD-10-CM | POA: Diagnosis not present

## 2024-05-15 DIAGNOSIS — Z79899 Other long term (current) drug therapy: Secondary | ICD-10-CM | POA: Insufficient documentation

## 2024-05-15 DIAGNOSIS — K5909 Other constipation: Secondary | ICD-10-CM | POA: Insufficient documentation

## 2024-05-15 DIAGNOSIS — K2970 Gastritis, unspecified, without bleeding: Secondary | ICD-10-CM

## 2024-05-15 DIAGNOSIS — I10 Essential (primary) hypertension: Secondary | ICD-10-CM

## 2024-05-15 DIAGNOSIS — K209 Esophagitis, unspecified without bleeding: Secondary | ICD-10-CM | POA: Diagnosis not present

## 2024-05-15 DIAGNOSIS — K222 Esophageal obstruction: Secondary | ICD-10-CM | POA: Insufficient documentation

## 2024-05-15 DIAGNOSIS — Z720 Tobacco use: Secondary | ICD-10-CM

## 2024-05-15 DIAGNOSIS — R1314 Dysphagia, pharyngoesophageal phase: Secondary | ICD-10-CM | POA: Diagnosis present

## 2024-05-15 DIAGNOSIS — D649 Anemia, unspecified: Secondary | ICD-10-CM

## 2024-05-15 DIAGNOSIS — Z7985 Long-term (current) use of injectable non-insulin antidiabetic drugs: Secondary | ICD-10-CM | POA: Insufficient documentation

## 2024-05-15 DIAGNOSIS — F17210 Nicotine dependence, cigarettes, uncomplicated: Secondary | ICD-10-CM | POA: Diagnosis not present

## 2024-05-15 DIAGNOSIS — E1165 Type 2 diabetes mellitus with hyperglycemia: Secondary | ICD-10-CM

## 2024-05-15 DIAGNOSIS — J45909 Unspecified asthma, uncomplicated: Secondary | ICD-10-CM | POA: Diagnosis not present

## 2024-05-15 DIAGNOSIS — E876 Hypokalemia: Secondary | ICD-10-CM

## 2024-05-15 HISTORY — PX: ESOPHAGOGASTRODUODENOSCOPY: SHX5428

## 2024-05-15 HISTORY — PX: ESOPHAGEAL DILATION: SHX303

## 2024-05-15 LAB — POCT PREGNANCY, URINE: Preg Test, Ur: NEGATIVE

## 2024-05-15 LAB — GLUCOSE, CAPILLARY: Glucose-Capillary: 108 mg/dL — ABNORMAL HIGH (ref 70–99)

## 2024-05-15 SURGERY — EGD (ESOPHAGOGASTRODUODENOSCOPY)
Anesthesia: General

## 2024-05-15 MED ORDER — PROPOFOL 500 MG/50ML IV EMUL
INTRAVENOUS | Status: DC | PRN
  Administered 2024-05-15 (×2): 50 mg via INTRAVENOUS
  Administered 2024-05-15: 100 mg via INTRAVENOUS

## 2024-05-15 MED ORDER — LACTATED RINGERS IV SOLN
INTRAVENOUS | Status: DC | PRN
Start: 2024-05-15 — End: 2024-05-15

## 2024-05-15 MED ORDER — OMEPRAZOLE 20 MG PO CPDR: 20.0000 mg | DELAYED_RELEASE_CAPSULE | Freq: Two times a day (BID) | ORAL | 5 refills | Status: AC

## 2024-05-15 MED ORDER — LIDOCAINE 2% (20 MG/ML) 5 ML SYRINGE
INTRAMUSCULAR | Status: DC | PRN
Start: 2024-05-15 — End: 2024-05-15
  Administered 2024-05-15: 60 mg via INTRAVENOUS

## 2024-05-15 MED ORDER — LACTATED RINGERS IV SOLN: INTRAVENOUS | Status: DC

## 2024-05-15 MED ORDER — DEXMEDETOMIDINE HCL IN NACL 80 MCG/20ML IV SOLN
INTRAVENOUS | Status: DC | PRN
  Administered 2024-05-15: 8 ug via INTRAVENOUS

## 2024-05-15 NOTE — Transfer of Care (Signed)
 Immediate Anesthesia Transfer of Care Note  Patient: Judy Nelson  Procedure(s) Performed: EGD (ESOPHAGOGASTRODUODENOSCOPY) DILATION, ESOPHAGUS  Patient Location: Short Stay  Anesthesia Type:General  Level of Consciousness: awake, alert , oriented, and patient cooperative  Airway & Oxygen Therapy: Patient Spontanous Breathing  Post-op Assessment: Report given to RN, Post -op Vital signs reviewed and stable, and Patient moving all extremities X 4  Post vital signs: Reviewed and stable  Last Vitals:  Vitals Value Taken Time  BP 117/73 05/15/24 12:17  Temp 36.6 C 05/15/24 12:17  Pulse 91 05/15/24 12:17  Resp 19 05/15/24 12:17  SpO2 100 % 05/15/24 12:17    Last Pain:  Vitals:   05/15/24 1217  TempSrc: Oral  PainSc: 0-No pain      Patients Stated Pain Goal: 5 (05/15/24 1114)  Complications: No notable events documented.

## 2024-05-15 NOTE — Discharge Instructions (Signed)
 EGD Discharge instructions Please read the instructions outlined below and refer to this sheet in the next few weeks. These discharge instructions provide you with general information on caring for yourself after you leave the hospital. Your doctor may also give you specific instructions. While your treatment has been planned according to the most current medical practices available, unavoidable complications occasionally occur. If you have any problems or questions after discharge, please call your doctor. ACTIVITY You may resume your regular activity but move at a slower pace for the next 24 hours.  Take frequent rest periods for the next 24 hours.  Walking will help expel (get rid of) the air and reduce the bloated feeling in your abdomen.  No driving for 24 hours (because of the anesthesia (medicine) used during the test).  You may shower.  Do not sign any important legal documents or operate any machinery for 24 hours (because of the anesthesia used during the test).  NUTRITION Drink plenty of fluids.  You may resume your normal diet.  Begin with a light meal and progress to your normal diet.  Avoid alcoholic beverages for 24 hours or as instructed by your caregiver.  MEDICATIONS You may resume your normal medications unless your caregiver tells you otherwise.  WHAT YOU CAN EXPECT TODAY You may experience abdominal discomfort such as a feeling of fullness or "gas" pains.  FOLLOW-UP Your doctor will discuss the results of your test with you.  SEEK IMMEDIATE MEDICAL ATTENTION IF ANY OF THE FOLLOWING OCCUR: Excessive nausea (feeling sick to your stomach) and/or vomiting.  Severe abdominal pain and distention (swelling).  Trouble swallowing.  Temperature over 101 F (37.8 C).  Rectal bleeding or vomiting of blood.    Your upper endoscopy revealed a tightening of your esophagus called a Schatzki's ring.  I stretched this out today.  Hopefully this helps with feeling of food getting  stuck.  I also took samples of your esophagus.  We will call with these results.  Your EGD revealed moderate amount inflammation in your stomach.  I took biopsies of this to rule out infection with a bacteria called H. pylori.  Await pathology results, my office will contact you.  Small bowel was normal.  I am going to change your PPI to omeprazole  20 mg twice daily instead of the pantoprazole  and I sent this to your pharmacy.    Follow up in GI office in 8 weeks.   I hope you have a great rest of your week!  Judy Nelson. Cindie, D.O. Gastroenterology and Hepatology [facility]

## 2024-05-15 NOTE — Anesthesia Preprocedure Evaluation (Signed)
 Anesthesia Evaluation  Patient identified by MRN, date of birth, ID band Patient awake    Reviewed: Allergy & Precautions, H&P , NPO status , Patient's Chart, lab work & pertinent test results, reviewed documented beta blocker date and time   Airway Mallampati: II  TM Distance: >3 FB Neck ROM: full    Dental no notable dental hx.    Pulmonary asthma , Current Smoker and Patient abstained from smoking.   Pulmonary exam normal breath sounds clear to auscultation       Cardiovascular Exercise Tolerance: Good hypertension,  Rhythm:regular Rate:Normal     Neuro/Psych  Headaches PSYCHIATRIC DISORDERS Anxiety Depression Bipolar Disorder      GI/Hepatic Neg liver ROS,GERD  ,,  Endo/Other  diabetes    Renal/GU negative Renal ROS  negative genitourinary   Musculoskeletal   Abdominal   Peds  Hematology  (+) Blood dyscrasia, anemia   Anesthesia Other Findings   Reproductive/Obstetrics negative OB ROS                              Anesthesia Physical Anesthesia Plan  ASA: 3  Anesthesia Plan: General   Post-op Pain Management:    Induction:   PONV Risk Score and Plan: Propofol  infusion  Airway Management Planned:   Additional Equipment:   Intra-op Plan:   Post-operative Plan:   Informed Consent: I have reviewed the patients History and Physical, chart, labs and discussed the procedure including the risks, benefits and alternatives for the proposed anesthesia with the patient or authorized representative who has indicated his/her understanding and acceptance.     Dental Advisory Given  Plan Discussed with: CRNA  Anesthesia Plan Comments:         Anesthesia Quick Evaluation

## 2024-05-15 NOTE — Interval H&P Note (Signed)
 History and Physical Interval Note:  05/15/2024 11:53 AM  Judy Nelson  has presented today for surgery, with the diagnosis of dysphagia, gerd.  The various methods of treatment have been discussed with the patient and family. After consideration of risks, benefits and other options for treatment, the patient has consented to  Procedure(s) with comments: EGD (ESOPHAGOGASTRODUODENOSCOPY) (N/A) - 1:00pm, asa 3 DILATION, ESOPHAGUS (N/A) as a surgical intervention.  The patient's history has been reviewed, patient examined, no change in status, stable for surgery.  I have reviewed the patient's chart and labs.  Questions were answered to the patient's satisfaction.     Judy Nelson

## 2024-05-15 NOTE — Op Note (Signed)
 [facility] Patient Name: Judy Nelson Procedure Date: 05/15/2024 11:50 AM MRN: [id number] Date of Birth: November 13, 1985 Attending MD: Carlin POUR. Cindie , [place], [id number] CSN: [id number] Age: 38 Admit Type: Outpatient Procedure:                Upper GI endoscopy Indications:              Dysphagia, Heartburn Providers:                Carlin POUR. Cindie, DO, Rosina Sprague, [place] Wilson,                            Technician Referring MD:              Medicines:                See the Anesthesia note for documentation of the                            administered medications Complications:            No immediate complications. Estimated Blood Loss:     Estimated blood loss was minimal. Procedure:                Pre-Anesthesia Assessment:                           - The anesthesia plan was to use monitored                            anesthesia care (MAC).                           After obtaining informed consent, the endoscope was                            passed under direct vision. Throughout the                            procedure, the patient's blood pressure, pulse, and                            oxygen saturations were monitored continuously. The                            GIF-H190 ([id number]) scope was introduced through the                            mouth, and advanced to the second part of duodenum.                            The upper GI endoscopy was accomplished without                            difficulty. The patient tolerated the procedure                            well. Scope In: 12:07:57 PM Scope  Out: 12:14:37 PM Total Procedure Duration: 0 hours 6 minutes 40 seconds  Findings:      A mild Schatzki ring was found in the distal esophagus. A TTS dilator       was passed through the scope. Dilation with an 18-19-20 mm balloon       dilator was performed to 18 mm. The dilation site was examined and       showed mild mucosal disruption and moderate improvement in  luminal       narrowing.      Biopsies were taken with a cold forceps in the middle third of the       esophagus for histology.      Localized moderate inflammation characterized by erosions and erythema       was found in the gastric antrum. Biopsies were taken with a cold forceps       for Helicobacter pylori testing.      The duodenal bulb, first portion of the duodenum and second portion of       the duodenum were normal. Impression:               - Mild Schatzki ring. Dilated.                           - Gastritis. Biopsied.                           - Normal duodenal bulb, first portion of the                            duodenum and second portion of the duodenum.                           - Biopsies were taken with a cold forceps for                            histology in the middle third of the esophagus. Moderate Sedation:      Per Anesthesia Care Recommendation:           - Patient has a contact number available for                            emergencies. The signs and symptoms of potential                            delayed complications were discussed with the                            patient. Return to normal activities tomorrow.                            Written discharge instructions were provided to the                            patient.                           - Resume previous diet.                           -  Continue present medications.                           - Await pathology results.                           - Repeat upper endoscopy PRN for retreatment.                           - Return to GI clinic in 8 weeks.                           - Use a proton pump inhibitor PO BID.                           - No ibuprofen , naproxen , or other non-steroidal                            anti-inflammatory drugs. Procedure Code(s):        --- Professional ---                           [phone], Esophagogastroduodenoscopy, flexible,                             transoral; with transendoscopic balloon dilation of                            esophagus (less than 30 mm diameter)                           43239, 59, Esophagogastroduodenoscopy, flexible,                            transoral; with biopsy, single or multiple Diagnosis Code(s):        --- Professional ---                           K22.2, Esophageal obstruction                           K29.70, Gastritis, unspecified, without bleeding                           R13.10, Dysphagia, unspecified                           R12, Heartburn CPT copyright 2022 [employer]. All rights reserved. The codes documented in this report are preliminary and upon coder review may  be revised to meet current compliance requirements. Carlin POUR. Cindie, DO Carlin POUR. Cindie, DO 05/15/2024 12:22:31 PM This report has been signed electronically. Number of Addenda: 0

## 2024-05-17 LAB — SURGICAL PATHOLOGY

## 2024-05-17 NOTE — Anesthesia Postprocedure Evaluation (Signed)
 Anesthesia Post Note  Patient: [name]  Procedure(s) Performed: EGD (ESOPHAGOGASTRODUODENOSCOPY) DILATION, ESOPHAGUS  Patient location during evaluation: Phase II Anesthesia Type: General Level of consciousness: awake Pain management: pain level controlled Vital Signs Assessment: post-procedure vital signs reviewed and stable Respiratory status: spontaneous breathing and respiratory function stable Cardiovascular status: blood pressure returned to baseline and stable Postop Assessment: no headache and no apparent nausea or vomiting Anesthetic complications: no Comments: Late entry   No notable events documented.   Last Vitals:  Vitals:   05/15/24 1115 05/15/24 1217  BP: (!) 145/90 117/73  Pulse:  91  Resp: 14 19  Temp: 36.8 C 36.6 C  SpO2: 99% 100%    Last Pain:  Vitals:   05/15/24 1217  TempSrc: Oral  PainSc: 0-No pain                 Yvonna JINNY Bosworth

## 2024-05-20 ENCOUNTER — Telehealth: Payer: Self-pay

## 2024-05-20 ENCOUNTER — Other Ambulatory Visit: Payer: Self-pay | Admitting: "Endocrinology

## 2024-05-20 MED ORDER — SEMAGLUTIDE (1 MG/DOSE) 4 MG/3ML ~~LOC~~ SOPN
1.0000 mg | PEN_INJECTOR | SUBCUTANEOUS | 0 refills | Status: DC
Start: 2024-05-20 — End: 2024-06-27

## 2024-05-20 NOTE — Telephone Encounter (Signed)
 Pt called requesting her Ozempic  be increased to 1mg  weekly. States she has tolerated the 0.5mg  dose well. Pt uses [facility] in [place] on [address].

## 2024-05-20 NOTE — Telephone Encounter (Signed)
 Noted

## 2024-05-21 ENCOUNTER — Encounter (HOSPITAL_COMMUNITY): Payer: Self-pay | Admitting: Internal Medicine

## 2024-05-27 ENCOUNTER — Ambulatory Visit: Admitting: "Endocrinology

## 2024-05-27 NOTE — Telephone Encounter (Signed)
 Noted

## 2024-05-27 NOTE — Telephone Encounter (Signed)
 Pt no showed appt today. Tried to call pt to reschedule no answer

## 2024-06-12 ENCOUNTER — Ambulatory Visit: Admitting: Surgery

## 2024-06-18 ENCOUNTER — Ambulatory Visit: Payer: Self-pay | Admitting: Internal Medicine

## 2024-06-19 ENCOUNTER — Encounter: Payer: Self-pay | Admitting: Obstetrics & Gynecology

## 2024-06-20 ENCOUNTER — Ambulatory Visit: Admitting: "Endocrinology

## 2024-06-20 ENCOUNTER — Encounter: Payer: Self-pay | Admitting: "Endocrinology

## 2024-06-20 VITALS — BP 132/88 | HR 100 | Ht 67.5 in | Wt 303.2 lb

## 2024-06-20 DIAGNOSIS — E1165 Type 2 diabetes mellitus with hyperglycemia: Secondary | ICD-10-CM | POA: Diagnosis not present

## 2024-06-20 DIAGNOSIS — E782 Mixed hyperlipidemia: Secondary | ICD-10-CM | POA: Diagnosis not present

## 2024-06-20 DIAGNOSIS — Z794 Long term (current) use of insulin: Secondary | ICD-10-CM | POA: Diagnosis not present

## 2024-06-20 LAB — POCT GLYCOSYLATED HEMOGLOBIN (HGB A1C)

## 2024-06-20 MED ORDER — TRESIBA FLEXTOUCH 200 UNIT/ML ~~LOC~~ SOPN: 90.0000 [IU] | PEN_INJECTOR | Freq: Every day | SUBCUTANEOUS | 1 refills | Status: DC

## 2024-06-20 MED ORDER — TRULICITY 0.75 MG/0.5ML ~~LOC~~ SOAJ: 0.7500 mg | SUBCUTANEOUS | 0 refills | Status: DC

## 2024-06-20 NOTE — Patient Instructions (Signed)

## 2024-06-20 NOTE — Progress Notes (Signed)
 06/20/2024, 3:27 PM  Endocrinology follow-up note   Subjective:    Patient ID: Judy Nelson, female    DOB: November 22, 1985.  Judy Nelson is being seen in follow-up after she was seen in consultation for management of currently uncontrolled symptomatic diabetes requested by  Dow Longs, PA-C.   Past Medical History:  Diagnosis Date   Anemia    Anxiety    Asthma    CSF leak    Depression    GERD (gastroesophageal reflux disease)    Heartburn 09/01/2015   no current med.   History of anemia 01/2014   Hypertension    Non-insulin  dependent type 2 diabetes mellitus (HCC)    Obesity    PTSD (post-traumatic stress disorder)    Scar of breast 08/2015   left   Sinus headache     Past Surgical History:  Procedure Laterality Date   BREAST SURGERY Bilateral    bilateral breast surgery to remove cyst at [facility] 2017   CRANIOTOMY N/A 06/30/2017   Procedure: BIFRONTAL CRANIOTOMY;  Surgeon: Lanis Pupa, MD;  Location: [facility];  Service: Neurosurgery;  Laterality: N/A;  BIFRONTAL CRANIOTOMY Repair of Encephalocele   ESOPHAGEAL DILATION N/A 05/15/2024   Procedure: DILATION, ESOPHAGUS;  Surgeon: Cindie Carlin POUR, DO;  [facility];  Service: Endoscopy;  Laterality: N/A;   ESOPHAGOGASTRODUODENOSCOPY N/A 05/15/2024   Procedure: EGD (ESOPHAGOGASTRODUODENOSCOPY);  Surgeon: Cindie Carlin POUR, DO;  Location: [facility];  Service: Endoscopy;  Laterality: N/A;  1:00pm, asa 3   GANGLION CYST EXCISION Left 02/07/2023   Procedure: REMOVAL GANGLION OF WRIST;  Surgeon: Margrette Taft BRAVO, MD;  Location: [facility];  Service: Orthopedics;  Laterality: Left;  LMA   INCISION AND DRAINAGE ABSCESS Left 10/07/2014   Procedure: INCISION AND DRAINAGE LEFT BREAST ABSCESS;  Surgeon: Oneil DELENA Budge, MD;  Location: [facility];  Service: General;  Laterality: Left;   INCISION AND DRAINAGE ABSCESS N/A 01/19/2024   Procedure: INCISION AND  DRAINAGE ABDOMINAL WALL ABSCESS;  Surgeon: Evonnie Dorothyann DELENA, DO;  Location: [facility];  Service: General;  Laterality: N/A;   LAPAROSCOPIC OVARIAN CYSTECTOMY Right 02/05/2014   Procedure: LAPAROSCOPIC right retroperitoneal (NOT OVARIAN) CYSTECTOMY;  Surgeon: Vonn VEAR Inch, MD;  Location: [facility];  Service: Gynecology;  Laterality: Right;   MASS EXCISION Left 09/07/2015   Procedure: Minor SCAR EXCISION LEFT BREAST, PLASTIC CLOSURE;  Surgeon: Elna Pick, MD;  Location: [facility];  Service: Plastics;  Laterality: Left;   TYMPANOSTOMY TUBE PLACEMENT Bilateral 10/17/2014   WOUND DEBRIDEMENT N/A 01/19/2024   Procedure: DEBRIDEMENT OF ABDOMINAL WALL NECROTIZING SOFT TISSUE INFECTION, 25 X 20 X 8 CM;  Surgeon: Evonnie Dorothyann DELENA, DO;  Location: [facility];  Service: General;  Laterality: N/A;    Social History   Socioeconomic History   Marital status: Single    Spouse name: Not on file   Number of children: Not on file   Years of education: Not on file   Highest education level: Not on file  Occupational History   Not on file  Tobacco Use   Smoking status: Every Day    Current packs/day: 0.50    Average packs/day: 0.5 packs/day for 7.0 years (3.5 ttl pk-yrs)    Types:  Cigarettes   Smokeless tobacco: Never  Vaping Use   Vaping status: Never Used  Substance and Sexual Activity   Alcohol use: Not Currently   Drug use: Yes    Types: Marijuana    Comment: THC gummies   Sexual activity: Yes    Birth control/protection: None  Other Topics Concern   Not on file  Social History Narrative   Not on file   Social Drivers of Health   Financial Resource Strain: Not on file  Food Insecurity: No Food Insecurity (01/19/2024)   Hunger Vital Sign    Worried About Running Out of Food in the Last Year: Never true    Ran Out of Food in the Last Year: Never true  Transportation Needs: No Transportation Needs (01/19/2024)   PRAPARE - Administrator, Civil Service  (Medical): No    Lack of Transportation (Non-Medical): No  Physical Activity: Not on file  Stress: Not on file  Social Connections: Not on file    Family History  Problem Relation Age of Onset   Diabetes Father    Hypertension Father    Heart disease Daughter        heart murmur   Cancer Maternal Grandmother        ovarian cancer   Obesity Paternal Grandmother    Diabetes Paternal Grandfather     Outpatient Encounter Medications as of 06/20/2024  Medication Sig   Dulaglutide  (TRULICITY ) 0.75 MG/0.5ML SOAJ Inject 0.75 mg into the skin once a week.   insulin  aspart (NOVOLOG  FLEXPEN) 100 UNIT/ML FlexPen Inject 10-16 Units into the skin 3 (three) times daily before meals. (Patient taking differently: Inject 1-12 Units into the skin 3 (three) times daily before meals.)   insulin  degludec (TRESIBA  FLEXTOUCH) 200 UNIT/ML FlexTouch Pen Inject 90 Units into the skin at bedtime.   solifenacin  (VESICARE ) 10 MG tablet Take 1 tablet (10 mg total) by mouth daily.   albuterol  (PROVENTIL  HFA;VENTOLIN  HFA) 108 (90 Base) MCG/ACT inhaler Inhale 2 puffs into the lungs every 6 (six) hours as needed for wheezing or shortness of breath.   ALPRAZolam  (XANAX ) 1 MG tablet TAKE 1 TABLET BY MOUTH THREE TIMES DAILY   amphetamine-dextroamphetamine (ADDERALL) 30 MG tablet Take 15 mg by mouth daily as needed (focusing).   Bioflavonoid Products (VITAMIN C) CHEW Chew 2 tablets by mouth daily.   clindamycin  (CLEOCIN  T) 1 % SWAB Apply 1 each topically 2 (two) times daily.   clindamycin  (CLINDAGEL) 1 % gel Apply topically 2 (two) times daily.   collagenase  (SANTYL ) 250 UNIT/GM ointment Apply to abdominal wound during wound vac changes Q 3days.   Continuous Glucose Receiver (DEXCOM G7 RECEIVER) DEVI Use to monitor BG continuously   Continuous Glucose Sensor (DEXCOM G7 SENSOR) MISC CHANGE SENSOR EVERY 10 DAYS   doxycycline  (VIBRAMYCIN ) 100 MG capsule Take 100 mg by mouth 2 (two) times daily.   DULoxetine  (CYMBALTA ) 60 MG  capsule Take 60 mg by mouth 2 (two) times daily.   Emollient (GOLD BOND DIABETICS DRY SKIN) CREA Apply 1 Application topically daily as needed (pain). Uses with aspercreme   fluticasone  (FLONASE ) 50 MCG/ACT nasal spray Place 2 sprays into both nostrils daily.   gabapentin  (NEURONTIN ) 800 MG tablet Take 800 mg by mouth 2 (two) times daily.   hydrOXYzine  (ATARAX ) 50 MG tablet Take 100 mg by mouth at bedtime.   ibuprofen  (ADVIL ) 800 MG tablet Take 1 tablet (800 mg total) by mouth every 8 (eight) hours as needed.   losartan (COZAAR)  25 MG tablet Take 25 mg by mouth daily.   Multiple Vitamin (MULTIVITAMIN) tablet Take 1 tablet by mouth daily.   mupirocin  ointment (BACTROBAN ) 2 % Apply to abdominal wound during wound vac changes Q 3days.   omeprazole  (PRILOSEC) 20 MG capsule Take 1 capsule (20 mg total) by mouth 2 (two) times daily before a meal. Take 30 min before breakfast and 30 min before dinner   ondansetron  (ZOFRAN ) 4 MG tablet Take 1 tablet (4 mg total) by mouth daily as needed for nausea or vomiting.   ondansetron  (ZOFRAN -ODT) 8 MG disintegrating tablet Take 8 mg by mouth every 8 (eight) hours as needed for nausea or vomiting.   oxyCODONE -acetaminophen  (PERCOCET) 10-325 MG tablet Take 1 tablet by mouth every 4 (four) hours as needed for pain.   Phenylephrine -Acetaminophen  (TYLENOL  SINUS+HEADACHE PO) Take 2 tablets by mouth 2 (two) times daily as needed (sinus pain).   polyethylene glycol (MIRALAX  / GLYCOLAX ) 17 g packet Take 17 g by mouth daily.   rosuvastatin  (CRESTOR ) 10 MG tablet Take 1 tablet (10 mg total) by mouth daily.   Semaglutide , 1 MG/DOSE, 4 MG/3ML SOPN Inject 1 mg as directed once a week. (Patient not taking: Reported on 06/20/2024)   silver  sulfADIAZINE  (SILVADENE ) 1 % cream Use to area 2-3 times daily   SODIUM FLUORIDE 5000 PPM 1.1 % PSTE BRUSH WITH A PEA SIZED AMOUNT FOR 2 MINS. THEN SPIT OUT THOROUGHLY TWICE DAILY. DO NOT RINSE. NOTHING BY MOUTH FOR 30 MINS.   Varenicline  Tartrate, Starter, 0.5 MG X 11 & 1 MG X 42 TBPK Take by mouth as directed. Starting pack (Patient not taking: Reported on 05/09/2024)   [DISCONTINUED] insulin  glargine (LANTUS ) 100 UNIT/ML injection Inject 80 Units into the skin at bedtime.   No facility-administered encounter medications on file as of 06/20/2024.    ALLERGIES: No Known Allergies   VACCINATION STATUS: Immunization History  Administered Date(s) Administered   Influenza,inj,Quad PF,6+ Mos 07/16/2013   Tdap 11/04/2013, 11/16/2021    Diabetes She presents for her follow-up diabetic visit. She has type 2 diabetes mellitus. Onset time: She was diagnosed at approximate age of 25 years. Her disease course has been worsening. There are no hypoglycemic associated symptoms. Pertinent negatives for hypoglycemia include no confusion, headaches, pallor or seizures. Associated symptoms include fatigue, polydipsia and polyuria. Pertinent negatives for diabetes include no blurred vision, no chest pain and no polyphagia. There are no hypoglycemic complications. Symptoms are worsening. Diabetic complications include peripheral neuropathy. 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  She is status post debridement, currently on wound VAC.  She is status post a course of antibiotics as well.) Risk factors for coronary artery disease include dyslipidemia, diabetes mellitus, obesity, sedentary lifestyle, tobacco exposure and family history. Current diabetic treatment includes insulin  injections. Her weight is decreasing steadily. She is following a generally unhealthy diet. When asked about meal planning, she reported none. She has not had a previous visit with a dietitian. She never participates in exercise. Her home blood glucose trend is increasing steadily. Her breakfast blood glucose range is generally >200 mg/dl. Her lunch blood glucose range is generally >200 mg/dl. Her dinner blood  glucose range is generally >200 mg/dl. Her bedtime blood glucose range is generally >200 mg/dl. Her overall blood glucose range is >200 mg/dl. (Judy Nelson presents with her CGM showing loss of control of glycemia.  She has 10% time in range, 65% Libre 1 hyperglycemia 25% every 2  hyperglycemia.  She has no hypoglycemia.  Her average blood glucose is 225 mg per DL.  Admittedly, she has not been consistent taking her prescribed insulin .  Her point-of-care A1c is 11.2% from 8.7%.       ) An ACE inhibitor/angiotensin II receptor blocker is not being taken.  Hyperlipidemia This is a chronic problem. The current episode started more than 1 year ago. The problem is uncontrolled. Exacerbating diseases include diabetes. Factors aggravating her hyperlipidemia include smoking. Pertinent negatives include no chest pain, myalgias or shortness of breath. She is currently on no antihyperlipidemic treatment. Risk factors for coronary artery disease include diabetes mellitus, dyslipidemia, family history, obesity, a sedentary lifestyle and hypertension.  Hypertension This is a chronic problem. The problem is controlled. Pertinent negatives include no blurred vision, chest pain, headaches, palpitations or shortness of breath. Risk factors for coronary artery disease include dyslipidemia, diabetes mellitus, smoking/tobacco exposure, sedentary lifestyle and family history. Past treatments include calcium  channel blockers.    Objective:       06/20/2024    2:55 PM 05/15/2024   12:17 PM 05/15/2024   11:15 AM  Vitals with BMI  Height 5' 7.5    Weight 303 lbs 3 oz    BMI 46.76    Systolic 132 117 854  Diastolic 88 73 90  Pulse 100 91     BP 132/88   Pulse 100   Ht 5' 7.5 (1.715 m)   Wt (!) 303 lb 3.2 oz (137.5 kg)   BMI 46.79 kg/m   Wt Readings from Last 3 Encounters:  06/20/24 (!) 303 lb 3.2 oz (137.5 kg)  05/15/24 298 lb (135.2 kg)  05/10/24 298 lb (135.2 kg)    Wound VAC in place, status postsurgical  debridement of necrotizing fasciitis on anterior abdominal wall.   CMP ( most recent) CMP     Component Value Date/Time   NA 135 05/10/2024 1032   NA 141 10/24/2023 1353   K 3.6 05/10/2024 1032   CL 99 05/10/2024 1032   CO2 23 05/10/2024 1032   GLUCOSE 400 (H) 05/10/2024 1032   BUN 5 (L) 05/10/2024 1032   BUN 5 (L) 10/24/2023 1353   CREATININE 0.70 05/10/2024 1032   CREATININE 0.44 (L) 10/22/2013 1247   CALCIUM  9.2 05/10/2024 1032   PROT 7.9 01/19/2024 0954   PROT 6.8 10/24/2023 1353   ALBUMIN  2.3 (L) 01/22/2024 0451   ALBUMIN  3.8 (L) 10/24/2023 1353   AST 11 (L) 01/19/2024 0954   ALT 11 01/19/2024 0954   ALKPHOS 125 01/19/2024 0954   BILITOT 0.9 01/19/2024 0954   BILITOT 0.2 10/24/2023 1353   GFRNONAA >60 05/10/2024 1032   GFRAA >60 03/12/2018 1315     Diabetic Labs (most recent): Lab Results  Component Value Date   HGBA1C 8.7 (H) 01/20/2024   HGBA1C 8.1 (A) 10/25/2023   HGBA1C 8.1 (A) 06/22/2023   MICROALBUR 150 02/22/2024     Lipid Panel ( most recent) Lipid Panel     Component Value Date/Time   CHOL 167 10/24/2023 1353   TRIG 135 10/24/2023 1353   HDL 44 10/24/2023 1353   CHOLHDL 3.8 10/24/2023 1353   LDLCALC 99 10/24/2023 1353   LABVLDL 24 10/24/2023 1353     Lab Results  Component Value Date   TSH 0.928 03/14/2023   TSH 0.910 08/10/2022   TSH 1.41 02/09/2022   TSH 1.580 07/20/2015   TSH 1.250 10/05/2014   FREET4 1.10 03/14/2023   FREET4 1.00 08/10/2022  Assessment & Plan:   1. Uncontrolled type 2 diabetes mellitus with hyperglycemia (HCC)  - Judy Nelson has currently uncontrolled symptomatic type 2 DM since  37 years of age.  Judy Nelson presents with her CGM showing loss of control of glycemia.  She has 10% time in range, 65% Libre 1 hyperglycemia 25% every 2 hyperglycemia.  She has no hypoglycemia.  Her average blood glucose is 225 mg per DL.  Admittedly, she has not been consistent taking her prescribed insulin .  Her point-of-care  A1c is 11.2% from 8.7%.     Recent labs reviewed. -She presents with persistent hyperglycemia. -her diabetes is complicated by peripheral neuropathy, obesity/sedentary life, smoking and she remains at a high risk for more acute and chronic complications which include CAD, CVA, CKD, retinopathy, and neuropathy. These are all discussed in detail with her.  In light of her comorbid conditions of obesity, type 2 diabetes, hyperlipidemia, hypertension, she is a candidate for lifestyle medicine.  -She did have significant disengagement from self-care due to depression.    - she acknowledges that there is a room for improvement in her food and drink choices. - Suggestion is made for her to avoid simple carbohydrates  from her diet including Cakes, Sweet Desserts, Ice Cream, Soda (diet and regular), Sweet Tea, Candies, Chips, Cookies, Store Bought Juices, Alcohol , Artificial Sweeteners,  Coffee Creamer, and Sugar-free Products, Lemonade. This will help patient to have more stable blood glucose profile and potentially avoid unintended weight gain.  The following Lifestyle Medicine recommendations according to American College of Lifestyle Medicine  [facility]) were discussed and and offered to patient and she  agrees to start the journey:  A. Whole Foods, Plant-Based Nutrition comprising of fruits and vegetables, plant-based proteins, whole-grain carbohydrates was discussed in detail with the patient.   A list for source of those nutrients were also provided to the patient.  Patient will use only water or unsweetened tea for hydration. B.  The need to stay away from risky substances including alcohol, smoking; obtaining 7 to 9 hours of restorative sleep, at least 150 minutes of moderate intensity exercise weekly, the importance of healthy social connections,  and stress management techniques were discussed. C.  A full color page of  Calorie density of various food groups per pound showing examples of each food  groups was provided to the patient.  - I have approached her with the following individualized plan to manage  her diabetes and patient agrees:   - In light of her presentation with significant hyperglycemic burden, she will continue to need multiple daily injections of insulin  in order for her to achieve control of diabetes to target.    - Accordingly, she is advised to increase her basal insulin , switching to Tresiba , at 90 units nightly, increase NovoLog  to 10 -16 units 3 times daily AC   for Premeal blood glucose readings above 90 mg per DL.  - she is urged to use her CGM continuously, and she is warned not to take insulin  without proper monitoring per orders.  - she is encouraged to call clinic for blood glucose levels less than 70 or above 200 mg /dl.  - She did not take her Ozempic  for unrelated reason of dysphagia which required EGD and what appears to be esophageal dilation.   - She is interested Trulicity  instead of Ozempic .  She is still a good candidate for GLP-1 receptor agonist.  I prescribed Trulicity  0.75 mg subcutaneously weekly.  This medication will be  adjusted as she tolerates. She was taken off of her Farxiga  while she was recovering from necrotizing fasciitis of her abdominal wall.  This may not be a safe or therapeutic option for her because of this reason.  - Specific targets for  A1c;  LDL, HDL,  and Triglycerides were discussed with the patient.  2) Blood Pressure /Hypertension: Her blood pressure is controlled - Her urine microalbumin is elevated at 150 mg/L today.  She is advised to continue losartan 25 mg p.o. daily at breakfast.  3) Lipids/Hyperlipidemia:   Review of her recent lipid panel showed significant improvement in her lipid panel including LDL at 99 improving from 154.  Her triglycerides are improving from 365 all the way to 135.   She is advised to continue Crestor  10 mg p.o. nightly.   4)  Weight/Diet:  Body mass index is 46.79 kg/m.   Her BMI is  high, clearly complicating her diabetes care.   she is  a candidate for modest weight loss. I discussed with her the fact that loss of 5 - 10% of her  current body weight will have the most impact on her diabetes management.  Exercise, and detailed carbohydrates information provided  -  detailed on discharge instructions.  5) Chronic Care/Health Maintenance:  -she  is not on ACEI/ARB and Statin medications and  is encouraged to initiate and continue to follow up with Ophthalmology, Dentist,  Podiatrist at least yearly or according to recommendations, and advised to  quit smoking. I have recommended yearly flu vaccine and pneumonia vaccine at least every 5 years; moderate intensity exercise for up to 150 minutes weekly; and  sleep for at least 7 hours a day.  - she is  advised to maintain close follow up with Dow Longs, PA-C for primary care needs, as well as her other providers for optimal and coordinated care.  I spent  40 minutes in the care of the patient today including review of labs from CMP, Lipids, Thyroid Function, Hematology (current and previous including abstractions from other facilities); face-to-face time discussing  her blood glucose readings/logs, discussing hypoglycemia and hyperglycemia episodes and symptoms, medications doses, her options of short and long term treatment based on the latest standards of care / guidelines;  discussion about incorporating lifestyle medicine;  and documenting the encounter. Risk reduction counseling performed per USPSTF guidelines to reduce  obesity and cardiovascular risk factors.     Please refer to Patient Instructions for Blood Glucose Monitoring and Insulin /Medications Dosing Guide  in media tab for additional information. Please  also refer to  Patient Self Inventory in the Media  tab for reviewed elements of pertinent patient history.  Judy Nelson participated in the discussions, expressed understanding, and voiced agreement with the  above plans.  All questions were answered to her satisfaction. she is encouraged to contact clinic should she have any questions or concerns prior to her return visit.   Follow up plan: - Return in about 3 months (around 09/19/2024) for Bring Meter/CGM Device/Logs- A1c in Office.  Ranny Earl, MD [facility] [address] [place], [place] [address] Phone: [phone]  Fax: [phone]    06/20/2024, 3:27 PM  This note was partially dictated with voice recognition software. Similar sounding words can be transcribed inadequately or may not  be corrected upon review.

## 2024-06-27 ENCOUNTER — Ambulatory Visit: Admitting: Surgery

## 2024-06-27 ENCOUNTER — Encounter: Payer: Self-pay | Admitting: Surgery

## 2024-06-27 VITALS — BP 150/95 | HR 110 | Temp 97.9°F | Resp 14 | Ht 67.5 in | Wt 299.0 lb

## 2024-06-27 DIAGNOSIS — S31109D Unspecified open wound of abdominal wall, unspecified quadrant without penetration into peritoneal cavity, subsequent encounter: Secondary | ICD-10-CM | POA: Diagnosis not present

## 2024-07-01 NOTE — Progress Notes (Signed)
 [facility] Note   HPI:  38 y.o. Female presents to clinic for wound recheck status post incision, drainage, and debridement of an abdominal wall necrotizing soft tissue infection on 4/4.  Since her last follow-up, the area has completely healed up.  She has a small amount of scabbing over top of the previous wound site.  She is currently just keeping some gauze over top of the area, as it is sitting in the skin fold.  She denies fevers and chills.  She also denies drainage to the area.  Review of Systems:  All other review of systems: otherwise negative   Vital Signs:  BP (!) 150/95   Pulse (!) 110   Temp 97.9 F (36.6 C) (Oral)   Resp 14   Ht 5' 7.5 (1.715 m)   Wt 299 lb (135.6 kg)   SpO2 97%   BMI 46.14 kg/m    Physical Exam:  Physical Exam Vitals reviewed.  Constitutional:      Appearance: Normal appearance.  Abdominal:     Comments: Abdomen soft, nondistended, no percussion tenderness, nontender to palpation; no rigidity, guarding, rebound tenderness; abdominal wound has completely healed with visible scarring without erythema, induration, or tenderness  Neurological:     Mental Status: She is alert.     Laboratory studies: None  Imaging:  None  Assessment:  38 y.o. yo Female who presents for wound recheck status post incision, drainage, and debridement of the abdominal wound soft tissue infection on 4/4  Plan:  - Patient has been doing very well since her last visit.  The area has completely healed up and is no longer requiring dressing changes - Patient may keep the area covered as it is in the skin fold to decrease risk of further irritation - Follow up with me in 6 weeks for 1 final wound recheck per the patient's request  All of the above recommendations were discussed with the patient, and all of patient's questions were answered to her expressed satisfaction.  Note: Portions of this report may have been transcribed using voice recognition  software. Every effort has been made to ensure accuracy; however, inadvertent computerized transcription errors may still be present.   Dorothyann Brittle, DO [facility] [address] [place], [place] [address] [phone] (office)

## 2024-07-08 MED ORDER — SOLIFENACIN SUCCINATE 10 MG PO TABS: 10.0000 mg | ORAL_TABLET | Freq: Every day | ORAL | 3 refills | Status: DC

## 2024-07-09 ENCOUNTER — Ambulatory Visit

## 2024-07-10 ENCOUNTER — Ambulatory Visit: Admitting: Podiatry

## 2024-07-10 ENCOUNTER — Ambulatory Visit: Admitting: Gastroenterology

## 2024-07-16 ENCOUNTER — Emergency Department (HOSPITAL_COMMUNITY)

## 2024-07-16 ENCOUNTER — Other Ambulatory Visit: Payer: Self-pay

## 2024-07-16 ENCOUNTER — Observation Stay (HOSPITAL_COMMUNITY)
Admission: EM | Admit: 2024-07-16 | Discharge: 2024-07-17 | Disposition: A | Discharge status: Home / Self Care | Source: Ambulatory Visit | Attending: Family Medicine | Admitting: Family Medicine

## 2024-07-16 ENCOUNTER — Encounter (HOSPITAL_COMMUNITY): Payer: Self-pay

## 2024-07-16 DIAGNOSIS — R739 Hyperglycemia, unspecified: Secondary | ICD-10-CM

## 2024-07-16 DIAGNOSIS — F17220 Nicotine dependence, chewing tobacco, uncomplicated: Secondary | ICD-10-CM | POA: Insufficient documentation

## 2024-07-16 DIAGNOSIS — K219 Gastro-esophageal reflux disease without esophagitis: Secondary | ICD-10-CM | POA: Diagnosis not present

## 2024-07-16 DIAGNOSIS — Z23 Encounter for immunization: Secondary | ICD-10-CM | POA: Insufficient documentation

## 2024-07-16 DIAGNOSIS — Z794 Long term (current) use of insulin: Secondary | ICD-10-CM | POA: Insufficient documentation

## 2024-07-16 DIAGNOSIS — F17210 Nicotine dependence, cigarettes, uncomplicated: Secondary | ICD-10-CM | POA: Insufficient documentation

## 2024-07-16 DIAGNOSIS — L02612 Cutaneous abscess of left foot: Secondary | ICD-10-CM

## 2024-07-16 DIAGNOSIS — E782 Mixed hyperlipidemia: Secondary | ICD-10-CM | POA: Insufficient documentation

## 2024-07-16 DIAGNOSIS — F12920 Cannabis use, unspecified with intoxication, uncomplicated: Secondary | ICD-10-CM | POA: Insufficient documentation

## 2024-07-16 DIAGNOSIS — M79672 Pain in left foot: Secondary | ICD-10-CM | POA: Diagnosis present

## 2024-07-16 DIAGNOSIS — D649 Anemia, unspecified: Secondary | ICD-10-CM | POA: Insufficient documentation

## 2024-07-16 DIAGNOSIS — E871 Hypo-osmolality and hyponatremia: Secondary | ICD-10-CM | POA: Insufficient documentation

## 2024-07-16 DIAGNOSIS — L03116 Cellulitis of left lower limb: Principal | ICD-10-CM | POA: Insufficient documentation

## 2024-07-16 DIAGNOSIS — E1165 Type 2 diabetes mellitus with hyperglycemia: Secondary | ICD-10-CM | POA: Insufficient documentation

## 2024-07-16 DIAGNOSIS — I10 Essential (primary) hypertension: Secondary | ICD-10-CM | POA: Diagnosis not present

## 2024-07-16 DIAGNOSIS — Z6841 Body Mass Index (BMI) 40.0 and over, adult: Secondary | ICD-10-CM | POA: Insufficient documentation

## 2024-07-16 DIAGNOSIS — L0390 Cellulitis, unspecified: Secondary | ICD-10-CM | POA: Diagnosis present

## 2024-07-16 DIAGNOSIS — J45909 Unspecified asthma, uncomplicated: Secondary | ICD-10-CM | POA: Insufficient documentation

## 2024-07-16 LAB — CBC WITH DIFFERENTIAL/PLATELET
Abs Immature Granulocytes: 0.06 K/uL (ref 0.00–0.07)
Basophils Absolute: 0.1 K/uL (ref 0.0–0.1)
Basophils Relative: 1 %
Eosinophils Absolute: 0.2 K/uL (ref 0.0–0.5)
Eosinophils Relative: 2 %
HCT: 38.2 % (ref 36.0–46.0)
Hemoglobin: 12.5 g/dL (ref 12.0–15.0)
Immature Granulocytes: 1 %
Lymphocytes Relative: 24 %
Lymphs Abs: 2.6 K/uL (ref 0.7–4.0)
MCH: 27.4 pg (ref 26.0–34.0)
MCHC: 32.7 g/dL (ref 30.0–36.0)
MCV: 83.6 fL (ref 80.0–100.0)
Monocytes Absolute: 0.6 K/uL (ref 0.1–1.0)
Monocytes Relative: 5 %
Neutro Abs: 7.1 K/uL (ref 1.7–7.7)
Neutrophils Relative %: 67 %
Platelets: 339 K/uL (ref 150–400)
RBC: 4.57 MIL/uL (ref 3.87–5.11)
RDW: 12.6 % (ref 11.5–15.5)
WBC: 10.5 K/uL (ref 4.0–10.5)
nRBC: 0 % (ref 0.0–0.2)

## 2024-07-16 LAB — COMPREHENSIVE METABOLIC PANEL WITH GFR
ALT: 8 U/L (ref 0–44)
AST: 11 U/L — ABNORMAL LOW (ref 15–41)
Albumin: 3.6 g/dL (ref 3.5–5.0)
Alkaline Phosphatase: 130 U/L — ABNORMAL HIGH (ref 38–126)
Anion gap: 12 (ref 5–15)
BUN: <5 mg/dL — ABNORMAL LOW (ref 6–20)
CO2: 23 mmol/L (ref 22–32)
Calcium: 9.5 mg/dL (ref 8.9–10.3)
Chloride: 99 mmol/L (ref 98–111)
Creatinine, Ser: 0.72 mg/dL (ref 0.44–1.00)
GFR, Estimated: >60 mL/min (ref >60)
Glucose, Bld: 373 mg/dL — ABNORMAL HIGH (ref 70–99)
Potassium: 4 mmol/L (ref 3.5–5.1)
Sodium: 133 mmol/L — ABNORMAL LOW (ref 135–145)
Total Bilirubin: <0.2 mg/dL (ref 0.0–1.2)
Total Protein: 7.7 g/dL (ref 6.5–8.1)

## 2024-07-16 LAB — HEMOGLOBIN A1C
Hgb A1c MFr Bld: 10.7 % — ABNORMAL HIGH (ref 4.8–5.6)
Mean Plasma Glucose: 260.39 mg/dL

## 2024-07-16 LAB — LACTIC ACID, PLASMA: Lactic Acid, Venous: 1.7 mmol/L (ref 0.5–1.9)

## 2024-07-16 LAB — HCG, SERUM, QUALITATIVE: Preg, Serum: NEGATIVE

## 2024-07-16 LAB — GLUCOSE, CAPILLARY
Glucose-Capillary: 226 mg/dL — ABNORMAL HIGH (ref 70–99)
Glucose-Capillary: 156 mg/dL — ABNORMAL HIGH (ref 70–99)

## 2024-07-16 MED ORDER — ENOXAPARIN SODIUM 40 MG/0.4ML IJ SOSY: 40.0000 mg | PREFILLED_SYRINGE | INTRAMUSCULAR | Status: DC

## 2024-07-16 MED ORDER — ONDANSETRON HCL 4 MG/2ML IJ SOLN
4.0000 mg | Freq: Once | INTRAMUSCULAR | Status: AC
Start: 2024-07-16 — End: 2024-07-16
  Administered 2024-07-16: 4 mg via INTRAVENOUS
  Filled 2024-07-16: qty 2

## 2024-07-16 MED ORDER — INSULIN ASPART 100 UNIT/ML IJ SOLN
0.0000 [IU] | Freq: Three times a day (TID) | INTRAMUSCULAR | Status: DC
  Administered 2024-07-16: 7 [IU] via SUBCUTANEOUS
  Administered 2024-07-17: 11 [IU] via SUBCUTANEOUS
  Administered 2024-07-17: 7 [IU] via SUBCUTANEOUS

## 2024-07-16 MED ORDER — LOSARTAN POTASSIUM 25 MG PO TABS
25.0000 mg | ORAL_TABLET | Freq: Every day | ORAL | Status: DC
  Administered 2024-07-16 – 2024-07-17 (×2): 25 mg via ORAL
  Filled 2024-07-16 (×2): qty 1

## 2024-07-16 MED ORDER — VANCOMYCIN HCL 2000 MG/400ML IV SOLN
2000.0000 mg | Freq: Once | INTRAVENOUS | Status: DC
  Filled 2024-07-16: qty 400

## 2024-07-16 MED ORDER — ONDANSETRON HCL 4 MG/2ML IJ SOLN: 4.0000 mg | Freq: Four times a day (QID) | INTRAMUSCULAR | Status: DC | PRN

## 2024-07-16 MED ORDER — ACETAMINOPHEN 650 MG RE SUPP: 650.0000 mg | Freq: Four times a day (QID) | RECTAL | Status: DC | PRN

## 2024-07-16 MED ORDER — FESOTERODINE FUMARATE ER 4 MG PO TB24
4.0000 mg | ORAL_TABLET | Freq: Every day | ORAL | Status: DC
  Administered 2024-07-17: 4 mg via ORAL
  Filled 2024-07-16: qty 1

## 2024-07-16 MED ORDER — POLYETHYLENE GLYCOL 3350 17 G PO PACK: 17.0000 g | PACK | Freq: Every day | ORAL | Status: DC | PRN

## 2024-07-16 MED ORDER — DULOXETINE HCL 60 MG PO CPEP
60.0000 mg | ORAL_CAPSULE | Freq: Two times a day (BID) | ORAL | Status: DC
  Administered 2024-07-16 – 2024-07-17 (×2): 60 mg via ORAL
  Filled 2024-07-16 (×2): qty 1

## 2024-07-16 MED ORDER — ROSUVASTATIN CALCIUM 10 MG PO TABS
10.0000 mg | ORAL_TABLET | Freq: Every day | ORAL | Status: DC
  Administered 2024-07-16: 10 mg via ORAL
  Filled 2024-07-16: qty 1

## 2024-07-16 MED ORDER — INSULIN GLARGINE 100 UNIT/ML ~~LOC~~ SOLN
28.0000 [IU] | Freq: Every day | SUBCUTANEOUS | Status: DC
  Administered 2024-07-16: 28 [IU] via SUBCUTANEOUS
  Filled 2024-07-16 (×2): qty 0.28

## 2024-07-16 MED ORDER — INFLUENZA VIRUS VACC SPLIT PF (FLUZONE) 0.5 ML IM SUSY: 0.5000 mL | PREFILLED_SYRINGE | INTRAMUSCULAR | Status: DC

## 2024-07-16 MED ORDER — SODIUM CHLORIDE 0.9 % IV SOLN
2.0000 g | INTRAVENOUS | Status: DC
  Administered 2024-07-16: 2 g via INTRAVENOUS
  Filled 2024-07-16: qty 20

## 2024-07-16 MED ORDER — VANCOMYCIN HCL 1500 MG/300ML IV SOLN
1500.0000 mg | Freq: Two times a day (BID) | INTRAVENOUS | Status: DC
  Administered 2024-07-17: 1500 mg via INTRAVENOUS
  Filled 2024-07-16: qty 300

## 2024-07-16 MED ORDER — SODIUM CHLORIDE 0.9 % IV SOLN: 1.0000 g | INTRAVENOUS | Status: DC

## 2024-07-16 MED ORDER — LACTATED RINGERS IV BOLUS
1000.0000 mL | Freq: Once | INTRAVENOUS | Status: AC
Start: 2024-07-16 — End: 2024-07-17
  Administered 2024-07-16: 1000 mL via INTRAVENOUS

## 2024-07-16 MED ORDER — PANTOPRAZOLE SODIUM 40 MG PO TBEC
40.0000 mg | DELAYED_RELEASE_TABLET | Freq: Every day | ORAL | Status: DC
  Administered 2024-07-16 – 2024-07-17 (×2): 40 mg via ORAL
  Filled 2024-07-16 (×2): qty 1

## 2024-07-16 MED ORDER — HYDROMORPHONE HCL 1 MG/ML IJ SOLN
1.0000 mg | Freq: Once | INTRAMUSCULAR | Status: DC
  Filled 2024-07-16: qty 1

## 2024-07-16 MED ORDER — NICOTINE 7 MG/24HR TD PT24
7.0000 mg | MEDICATED_PATCH | Freq: Every day | TRANSDERMAL | Status: DC
  Administered 2024-07-16 – 2024-07-17 (×2): 7 mg via TRANSDERMAL
  Filled 2024-07-16 (×2): qty 1

## 2024-07-16 MED ORDER — AMPHETAMINE-DEXTROAMPHETAMINE 10 MG PO TABS: 15.0000 mg | ORAL_TABLET | Freq: Every day | ORAL | Status: DC | PRN

## 2024-07-16 MED ORDER — VANCOMYCIN HCL 2000 MG/400ML IV SOLN
2000.0000 mg | Freq: Once | INTRAVENOUS | Status: AC
  Administered 2024-07-16: 2000 mg via INTRAVENOUS
  Filled 2024-07-16: qty 400

## 2024-07-16 MED ORDER — PIPERACILLIN-TAZOBACTAM 3.375 G IVPB 30 MIN
3.3750 g | Freq: Once | INTRAVENOUS | Status: AC
  Administered 2024-07-16: 3.375 g via INTRAVENOUS
  Filled 2024-07-16: qty 50

## 2024-07-16 MED ORDER — GABAPENTIN 400 MG PO CAPS
800.0000 mg | ORAL_CAPSULE | Freq: Two times a day (BID) | ORAL | Status: DC
  Administered 2024-07-16 – 2024-07-17 (×2): 800 mg via ORAL
  Filled 2024-07-16 (×2): qty 2

## 2024-07-16 MED ORDER — LIDOCAINE HCL (PF) 1 % IJ SOLN
30.0000 mL | Freq: Once | INTRAMUSCULAR | Status: AC
Start: 2024-07-16 — End: 2024-07-16
  Administered 2024-07-16: 30 mL
  Filled 2024-07-16: qty 30

## 2024-07-16 MED ORDER — ENOXAPARIN SODIUM 80 MG/0.8ML IJ SOSY
70.0000 mg | PREFILLED_SYRINGE | INTRAMUSCULAR | Status: DC
  Administered 2024-07-16: 70 mg via SUBCUTANEOUS
  Filled 2024-07-16: qty 0.8

## 2024-07-16 MED ORDER — PNEUMOCOCCAL 20-VAL CONJ VACC 0.5 ML IM SUSY: 0.5000 mL | PREFILLED_SYRINGE | INTRAMUSCULAR | Status: DC | PRN

## 2024-07-16 MED ORDER — OXYCODONE-ACETAMINOPHEN 5-325 MG PO TABS
1.0000 | ORAL_TABLET | ORAL | Status: DC | PRN
  Administered 2024-07-16: 1 via ORAL
  Filled 2024-07-16: qty 1

## 2024-07-16 MED ORDER — LIDOCAINE-PRILOCAINE 2.5-2.5 % EX CREA
TOPICAL_CREAM | Freq: Once | CUTANEOUS | Status: AC
  Filled 2024-07-16: qty 5

## 2024-07-16 MED ORDER — MORPHINE SULFATE (PF) 4 MG/ML IV SOLN
4.0000 mg | Freq: Once | INTRAVENOUS | Status: AC
  Administered 2024-07-16: 4 mg via INTRAVENOUS
  Filled 2024-07-16: qty 1

## 2024-07-16 MED ORDER — ONDANSETRON HCL 4 MG PO TABS: 4.0000 mg | ORAL_TABLET | Freq: Four times a day (QID) | ORAL | Status: DC | PRN

## 2024-07-16 MED ORDER — INFLUENZA VIRUS VACC SPLIT PF (FLUZONE) 0.5 ML IM SUSY
0.5000 mL | PREFILLED_SYRINGE | INTRAMUSCULAR | Status: AC | PRN
  Administered 2024-07-17: 0.5 mL via INTRAMUSCULAR
  Filled 2024-07-16: qty 0.5

## 2024-07-16 MED ORDER — INSULIN ASPART 100 UNIT/ML IJ SOLN
6.0000 [IU] | Freq: Three times a day (TID) | INTRAMUSCULAR | Status: DC
  Administered 2024-07-16 – 2024-07-17 (×3): 6 [IU] via SUBCUTANEOUS

## 2024-07-16 MED ORDER — INSULIN ASPART 100 UNIT/ML IJ SOLN: 0.0000 [IU] | Freq: Every day | INTRAMUSCULAR | Status: DC

## 2024-07-16 MED ORDER — INSULIN ASPART 100 UNIT/ML IJ SOLN
10.0000 [IU] | Freq: Once | INTRAMUSCULAR | Status: AC
Start: 2024-07-16 — End: 2024-07-16
  Administered 2024-07-16: 10 [IU] via SUBCUTANEOUS

## 2024-07-16 MED ORDER — FLUTICASONE PROPIONATE 50 MCG/ACT NA SUSP: 2.0000 | Freq: Every day | NASAL | Status: DC

## 2024-07-16 MED ORDER — PNEUMOCOCCAL 20-VAL CONJ VACC 0.5 ML IM SUSY: 0.5000 mL | PREFILLED_SYRINGE | INTRAMUSCULAR | Status: DC

## 2024-07-16 MED ORDER — OXYCODONE HCL 5 MG PO TABS
5.0000 mg | ORAL_TABLET | ORAL | Status: DC | PRN
Start: 2024-07-16 — End: 2024-07-17
  Administered 2024-07-16 (×2): 5 mg via ORAL
  Filled 2024-07-16 (×2): qty 1

## 2024-07-16 MED ORDER — VANCOMYCIN HCL IN DEXTROSE 1-5 GM/200ML-% IV SOLN: 1000.0000 mg | Freq: Once | INTRAVENOUS | Status: DC

## 2024-07-16 MED ORDER — HYDROXYZINE HCL 25 MG PO TABS
100.0000 mg | ORAL_TABLET | Freq: Every day | ORAL | Status: DC
  Administered 2024-07-16: 100 mg via ORAL
  Filled 2024-07-16: qty 4

## 2024-07-16 MED ORDER — ALBUTEROL SULFATE (2.5 MG/3ML) 0.083% IN NEBU: 3.0000 mL | INHALATION_SOLUTION | Freq: Four times a day (QID) | RESPIRATORY_TRACT | Status: DC | PRN

## 2024-07-16 MED ORDER — OXYCODONE-ACETAMINOPHEN 10-325 MG PO TABS: 1.0000 | ORAL_TABLET | ORAL | Status: DC | PRN

## 2024-07-16 MED ORDER — POLYETHYLENE GLYCOL 3350 17 G PO PACK: 17.0000 g | PACK | Freq: Every day | ORAL | Status: DC

## 2024-07-16 MED ORDER — ALPRAZOLAM 1 MG PO TABS
1.0000 mg | ORAL_TABLET | Freq: Three times a day (TID) | ORAL | Status: DC
  Administered 2024-07-16 – 2024-07-17 (×3): 1 mg via ORAL
  Filled 2024-07-16 (×3): qty 1

## 2024-07-16 MED ORDER — ACETAMINOPHEN 325 MG PO TABS: 650.0000 mg | ORAL_TABLET | Freq: Four times a day (QID) | ORAL | Status: DC | PRN

## 2024-07-16 NOTE — H&P (Signed)
 History and Physical    Judy Nelson FMW:[id number] DOB: 1986/10/16 DOA: 07/16/2024  PCP: Dow Longs, PA-C   Patient coming from: Home  Chief Complaint: Left foot pain with drainage  HPI: Judy Nelson is a 38 y.o. female with medical history significant for poorly controlled, insulin -dependent-type 2 diabetes, hypertension, GERD, asthma, and dyslipidemia who presented to the ED for worsening pain and swelling to the plantar aspect of her left foot.  Apparently about 1 month ago she stepped on a piece of glass and was seen at the urgent care and had her tetanus shot updated.  She did have some worsening swelling and pain around this area and she was seen again at urgent care 1 week ago at which time incision and drainage was performed and she was placed on doxycycline  and has finished a full course over the last week.  She still continues to notice worsening swelling since that time and has now been having intermittent fevers at home for which she presented.  She has been trying to take good care of her wound at home, but has otherwise been busy and states that she does not check her blood glucose at home regularly and has not been taking her insulin  as she should because she had some areas of necrosis related to use of her insulin  pen and is now scared to use it.  She is working on this with her therapist and endocrinologist.  She states that her recent A1c was over 11%.   ED Course: Vital signs are stable and patient is afebrile.  Glucose 373 and sodium 133 and no leukocytosis noted.  Left foot x-ray showing small shard of glass and some thickening that is focal to the area of infection.  She underwent some incision and drainage.  She was given a dose of IV Zosyn  and vancomycin  in the ED.  Review of Systems: Reviewed as noted above, otherwise negative.  Past Medical History:  Diagnosis Date   Anemia    Anxiety    Asthma    CSF leak    Depression    GERD (gastroesophageal reflux  disease)    Heartburn 09/01/2015   no current med.   History of anemia 01/2014   Hypertension    Non-insulin  dependent type 2 diabetes mellitus (HCC)    Obesity    PTSD (post-traumatic stress disorder)    Scar of breast 08/2015   left   Sinus headache     Past Surgical History:  Procedure Laterality Date   BREAST SURGERY Bilateral    bilateral breast surgery to remove cyst at [facility] 2017   CRANIOTOMY N/A 06/30/2017   Procedure: BIFRONTAL CRANIOTOMY;  Surgeon: Lanis Pupa, MD;  Location[facility] OR;  Service: Neurosurgery;  Laterality: N/A;  BIFRONTAL CRANIOTOMY Repair of Encephalocele   ESOPHAGEAL DILATION N/A 05/15/2024   Procedure: DILATION, ESOPHAGUS;  Surgeon: Cindie Carlin POUR, DO;  Location: [facility]ITE;  Service: Endoscopy;  Laterality: N/A;   ESOPHAGOGASTRODUODENOSCOPY N/A 05/15/2024   Procedure: EGD (ESOPHAGOGASTRODUODENOSCOPY);  Surgeon: Cindie Carlin POUR, DO;  Location: [facility];  Service: Endoscopy;  Laterality: N/A;  1:00pm, asa 3   GANGLION CYST EXCISION Left 02/07/2023   Procedure: REMOVAL GANGLION OF WRIST;  Surgeon: Margrette Taft BRAVO, MD;  Location: [facility] ORS;  Service: Orthopedics;  Laterality: Left;  LMA   INCISION AND DRAINAGE ABSCESS Left 10/07/2014   Procedure: INCISION AND DRAINAGE LEFT BREAST ABSCESS;  Surgeon: Oneil DELENA Budge, MD;  Location: [facility] ORS;  Service: General;  Laterality: Left;  INCISION AND DRAINAGE ABSCESS N/A 01/19/2024   Procedure: INCISION AND DRAINAGE ABDOMINAL WALL ABSCESS;  Surgeon: Evonnie Dorothyann LABOR, DO;  Location: [facility] ORS;  Service: General;  Laterality: N/A;   LAPAROSCOPIC OVARIAN CYSTECTOMY Right 02/05/2014   Procedure: LAPAROSCOPIC right retroperitoneal (NOT OVARIAN) CYSTECTOMY;  Surgeon: Vonn VEAR Inch, MD;  Location: [facility] ORS;  Service: Gynecology;  Laterality: Right;   MASS EXCISION Left 09/07/2015   Procedure: Minor SCAR EXCISION LEFT BREAST, PLASTIC CLOSURE;  Surgeon: Elna Pick, MD;  Location: [facility];   Service: Plastics;  Laterality: Left;   TYMPANOSTOMY TUBE PLACEMENT Bilateral 10/17/2014   WOUND DEBRIDEMENT N/A 01/19/2024   Procedure: DEBRIDEMENT OF ABDOMINAL WALL NECROTIZING SOFT TISSUE INFECTION, 25 X 20 X 8 CM;  Surgeon: Evonnie Dorothyann LABOR, DO;  Location: [facility] ORS;  Service: General;  Laterality: N/A;     reports that she has been smoking cigarettes. She has a 3.5 pack-year smoking history. She has never used smokeless tobacco. She reports that she does not currently use alcohol. She reports current drug use. Drug: Marijuana.  Allergies  Allergen Reactions   Dilaudid  [Hydromorphone ] Other (See Comments)    Itching and delutio    Family History  Problem Relation Age of Onset   Diabetes Father    Hypertension Father    Heart disease Daughter        heart murmur   Cancer Maternal Grandmother        ovarian cancer   Obesity Paternal Grandmother    Diabetes Paternal Grandfather     Prior to Admission medications   Medication Sig Start Date End Date Taking? Authorizing Provider  albuterol  (PROVENTIL  HFA;VENTOLIN  HFA) 108 (90 Base) MCG/ACT inhaler Inhale 2 puffs into the lungs every 6 (six) hours as needed for wheezing or shortness of breath.    [provider]  ALPRAZolam  (XANAX ) 1 MG tablet TAKE 1 TABLET BY MOUTH THREE TIMES DAILY 07/26/16   Inch Vonn VEAR, MD  amphetamine-dextroamphetamine (ADDERALL) 30 MG tablet Take 15 mg by mouth daily as needed (focusing).    [provider]  Bioflavonoid Products (VITAMIN C) CHEW Chew 2 tablets by mouth daily.    [provider]  clindamycin  (CLEOCIN  T) 1 % SWAB Apply 1 each topically 2 (two) times daily. 03/08/24   [provider]  clindamycin  (CLINDAGEL) 1 % gel Apply topically 2 (two) times daily. 09/19/23   Inch Vonn VEAR, MD  collagenase  (SANTYL ) 250 UNIT/GM ointment Apply to abdominal wound during wound vac changes Q 3days. 02/01/24   Mavis Anes, MD  Continuous Glucose Receiver (DEXCOM G7  RECEIVER) DEVI Use to monitor BG continuously 02/07/23   Therisa Benton PARAS, NP  Continuous Glucose Sensor (DEXCOM G7 SENSOR) MISC CHANGE SENSOR EVERY 10 DAYS 01/17/24   Nida, Gebreselassie W, MD  doxycycline  (VIBRAMYCIN ) 100 MG capsule Take 100 mg by mouth 2 (two) times daily. 02/29/24   [provider]  Dulaglutide  (TRULICITY ) 0.75 MG/0.5ML SOAJ Inject 0.75 mg into the skin once a week. Patient not taking: Reported on 06/27/2024 06/20/24   Nida, Gebreselassie W, MD  DULoxetine  (CYMBALTA ) 60 MG capsule Take 60 mg by mouth 2 (two) times daily.    [provider]  Emollient (GOLD BOND DIABETICS DRY SKIN) CREA Apply 1 Application topically daily as needed (pain). Uses with aspercreme    [provider]  fluticasone  (FLONASE ) 50 MCG/ACT nasal spray Place 2 sprays into both nostrils daily. 11/05/23   Mayers, Cari S, PA-C  gabapentin  (NEURONTIN ) 800 MG tablet Take  800 mg by mouth 2 (two) times daily. 11/15/22   [provider]  hydrOXYzine  (ATARAX ) 50 MG tablet Take 100 mg by mouth at bedtime.    [provider]  ibuprofen  (ADVIL ) 800 MG tablet Take 1 tablet (800 mg total) by mouth every 8 (eight) hours as needed. 02/07/23   Margrette Taft BRAVO, MD  insulin  aspart (NOVOLOG  FLEXPEN) 100 UNIT/ML FlexPen Inject 10-16 Units into the skin 3 (three) times daily before meals. Patient taking differently: Inject 1-12 Units into the skin 3 (three) times daily before meals. 06/22/23   Nida, Gebreselassie W, MD  insulin  degludec (TRESIBA  FLEXTOUCH) 200 UNIT/ML FlexTouch Pen Inject 90 Units into the skin at bedtime. 06/20/24   Nida, Gebreselassie W, MD  losartan (COZAAR) 25 MG tablet Take 25 mg by mouth daily. 10/30/23   [provider]  Multiple Vitamin (MULTIVITAMIN) tablet Take 1 tablet by mouth daily.    [provider]  mupirocin  ointment (BACTROBAN ) 2 % Apply to abdominal wound during wound vac changes Q 3days. 02/01/24   Mavis Anes, MD  omeprazole  (PRILOSEC) 20  MG capsule Take 1 capsule (20 mg total) by mouth 2 (two) times daily before a meal. Take 30 min before breakfast and 30 min before dinner 05/15/24 11/11/24  Cindie Carlin POUR, DO  ondansetron  (ZOFRAN ) 4 MG tablet Take 1 tablet (4 mg total) by mouth daily as needed for nausea or vomiting. 01/23/24 01/22/25  Pappayliou, Dorothyann A, DO  ondansetron  (ZOFRAN -ODT) 8 MG disintegrating tablet Take 8 mg by mouth every 8 (eight) hours as needed for nausea or vomiting. 08/18/23   [provider]  oxyCODONE -acetaminophen  (PERCOCET) 10-325 MG tablet Take 1 tablet by mouth every 4 (four) hours as needed for pain.    [provider]  Phenylephrine -Acetaminophen  (TYLENOL  SINUS+HEADACHE PO) Take 2 tablets by mouth 2 (two) times daily as needed (sinus pain).    [provider]  polyethylene glycol (MIRALAX  / GLYCOLAX ) 17 g packet Take 17 g by mouth daily. 01/24/24   Pappayliou, Dorothyann A, DO  rosuvastatin  (CRESTOR ) 10 MG tablet Take 1 tablet (10 mg total) by mouth daily. 08/11/22   Nida, Gebreselassie W, MD  silver  sulfADIAZINE  (SILVADENE ) 1 % cream Use to area 2-3 times daily 01/25/23   Jayne Vonn DEL, MD  SODIUM FLUORIDE 5000 PPM 1.1 % PSTE BRUSH WITH A PEA SIZED AMOUNT FOR 2 MINS. THEN SPIT OUT THOROUGHLY TWICE DAILY. DO NOT RINSE. NOTHING BY MOUTH FOR 30 MINS. 10/30/23   [provider]  solifenacin  (VESICARE ) 10 MG tablet Take 1 tablet (10 mg total) by mouth daily. 07/08/24   Jayne Vonn DEL, MD    Physical Exam: Vitals:   07/16/24 0950 07/16/24 0953 07/16/24 1246  BP:  (!) 155/88 (!) 144/97  Pulse:  100 100  Resp:  16   Temp:  98 F (36.7 C) 98.4 F (36.9 C)  TempSrc:  Temporal Oral  SpO2:  100% 100%  Weight: 135.6 kg  (!) 138.8 kg  Height: 5' 7.5 (1.715 m)  5' 7.5 (1.715 m)    Constitutional: NAD, calm, comfortable Vitals:   07/16/24 0950 07/16/24 0953 07/16/24 1246  BP:  (!) 155/88 (!) 144/97  Pulse:  100 100  Resp:  16   Temp:  98 F (36.7 C) 98.4 F (36.9 C)   TempSrc:  Temporal Oral  SpO2:  100% 100%  Weight: 135.6 kg  (!) 138.8 kg  Height: 5' 7.5 (1.715 m)  5' 7.5 (1.715 m)   Eyes: lids  and conjunctivae normal Neck: normal, supple Respiratory: clear to auscultation bilaterally. Normal respiratory effort. No accessory muscle use.  Cardiovascular: Regular rate and rhythm, no murmurs. Abdomen: no tenderness, no distention. Bowel sounds positive.  Musculoskeletal:  No edema. Skin:  Psychiatric: Flat affect  Labs on Admission: I have personally reviewed following labs and imaging studies  CBC: Recent Labs  Lab 07/16/24 1044  WBC 10.5  NEUTROABS 7.1  HGB 12.5  HCT 38.2  MCV 83.6  PLT 339   Basic Metabolic Panel: Recent Labs  Lab 07/16/24 1044  NA 133*  K 4.0  CL 99  CO2 23  GLUCOSE 373*  BUN <5*  CREATININE 0.72  CALCIUM  9.5   GFR: Estimated Creatinine Clearance: 141.7 mL/min (by C-G formula based on SCr of 0.72 mg/dL). Liver Function Tests: Recent Labs  Lab 07/16/24 1044  AST 11*  ALT 8  ALKPHOS 130*  BILITOT <0.2  PROT 7.7  ALBUMIN  3.6   No results for input(s): LIPASE, AMYLASE in the last 168 hours. No results for input(s): AMMONIA in the last 168 hours. Coagulation Profile: No results for input(s): INR, PROTIME in the last 168 hours. Cardiac Enzymes: No results for input(s): CKTOTAL, CKMB, CKMBINDEX, TROPONINI in the last 168 hours. BNP (last 3 results) No results for input(s): PROBNP in the last 8760 hours. HbA1C: No results for input(s): HGBA1C in the last 72 hours. CBG: No results for input(s): GLUCAP in the last 168 hours. Lipid Profile: No results for input(s): CHOL, HDL, LDLCALC, TRIG, CHOLHDL, LDLDIRECT in the last 72 hours. Thyroid Function Tests: No results for input(s): TSH, T4TOTAL, FREET4, T3FREE, THYROIDAB in the last 72 hours. Anemia Panel: No results for input(s): VITAMINB12, FOLATE, FERRITIN, TIBC, IRON, RETICCTPCT in the last  72 hours. Urine analysis:    Component Value Date/Time   COLORURINE YELLOW 11/16/2021 0829   APPEARANCEUR HAZY (A) 11/16/2021 0829   LABSPEC >1.030 (H) 11/16/2021 0829   PHURINE 6.0 11/16/2021 0829   GLUCOSEU >=500 (A) 11/16/2021 0829   HGBUR LARGE (A) 11/16/2021 0829   BILIRUBINUR NEGATIVE 11/16/2021 0829   KETONESUR 15 (A) 11/16/2021 0829   PROTEINUR 100 (A) 11/16/2021 0829   UROBILINOGEN 0.2 10/05/2014 1300   NITRITE NEGATIVE 11/16/2021 0829   LEUKOCYTESUR NEGATIVE 11/16/2021 0829    Radiological Exams on Admission: DG Foot Complete Left Result Date: 07/16/2024 CLINICAL DATA:  Plantar swelling, possible abscess. Stepped on glass 1 month ago. EXAM: LEFT FOOT - COMPLETE 3+ VIEW COMPARISON:  None Available. FINDINGS: Bony structures and joint spaces are within normal. There is focal skin thickening of the mid aspect of the plantar surface of the foot on the lateral film as there is a 4-5 mm radiopaque foreign body within the soft tissue in this region likely representing the glass that patient stepped on per history. IMPRESSION: 1. No acute bony findings. 2. Focal skin thickening of the mid aspect of the plantar surface of the foot with a 4-5 mm radiopaque foreign body within the soft tissue in this region likely representing the glass that patient stepped on per history. Electronically Signed   By: Toribio Agreste M.D.   On: 07/16/2024 10:54    Assessment/Plan Principal Problem:   Cellulitis Active Problems:   Morbid obesity (HCC)   Uncontrolled type 2 diabetes mellitus with hyperglycemia (HCC)   Essential hypertension, benign   Mixed hyperlipidemia    Left foot cellulitis/abscess - Status post I/D in ED 9/30 - Recently had ID and UC and completed course of doxycycline  and despite  this had worsening pain - Blood cultures ordered and pending - Continue on Rocephin and vancomycin  for now and check MRSA PCR  Type 2 diabetes with hyperglycemia -Noncompliant with her home insulin   regimen - Carb modified diet and SSI - Check A1c, prior on 01/2024 8.7%, recently she states it was over 11%  Mild hyponatremia-likely pseudohyponatremia - In the setting of hyperglycemia as noted above - Control blood glucose and monitor  Dyslipidemia - Continue Crestor   Tobacco abuse - Counseled on cessation  GERD - PPI  Obesity, class III -Used to be on Ozempic  and now discontinued - BMI 47.22   DVT prophylaxis: Lovenox Code Status: Full Family Communication: None Disposition Plan: Treatment of cellulitis and wound care Consults called: None Admission status: Observation, MedSurg  Severity of Illness: The appropriate patient status for this patient is OBSERVATION. Observation status is judged to be reasonable and necessary in order to provide the required intensity of service to ensure the patient's safety. The patient's presenting symptoms, physical exam findings, and initial radiographic and laboratory data in the context of their medical condition is felt to place them at decreased risk for further clinical deterioration. Furthermore, it is anticipated that the patient will be medically stable for discharge from the hospital within 2 midnights of admission.    Aundra Espin D Maree DO [facility]  If 7PM-7AM, please contact night-coverage [email or web address]  07/16/2024, 2:07 PM

## 2024-07-16 NOTE — Plan of Care (Signed)

## 2024-07-16 NOTE — ED Provider Notes (Signed)
 [place] EMERGENCY DEPARTMENT AT [facility] Provider Note   CSN: [id number] Arrival date & time: 07/16/24  9055     Patient presents with: Wound Check   Judy Nelson is a 38 y.o. female.   Patient is a 38 year old female who presents to emergency department the chief complaint of pain and swelling to the plantar aspect of her left foot.  Patient notes that approxi-1 month ago she stepped on a piece of glass.  She was evaluated urgent care at that time and tetanus shot was updated.  She notes that she did develop worsening swelling and abscess and was evaluated urgent care approximate 1 week ago.  Incision and drainage was performed at that time and she was placed on doxycycline  and has taken a full course of this.  She notes that she has developed worsening swelling since that time.  She does have a history of diabetes.  She does note that she has been having intermittent fevers at home.   Wound Check       Prior to Admission medications   Medication Sig Start Date End Date Taking? Authorizing Provider  albuterol  (PROVENTIL  HFA;VENTOLIN  HFA) 108 (90 Base) MCG/ACT inhaler Inhale 2 puffs into the lungs every 6 (six) hours as needed for wheezing or shortness of breath.    [provider]  ALPRAZolam  (XANAX ) 1 MG tablet TAKE 1 TABLET BY MOUTH THREE TIMES DAILY 07/26/16   Jayne Vonn DEL, MD  amphetamine-dextroamphetamine (ADDERALL) 30 MG tablet Take 15 mg by mouth daily as needed (focusing).    [provider]  Bioflavonoid Products (VITAMIN C) CHEW Chew 2 tablets by mouth daily.    [provider]  clindamycin  (CLEOCIN  T) 1 % SWAB Apply 1 each topically 2 (two) times daily. 03/08/24   [provider]  clindamycin  (CLINDAGEL) 1 % gel Apply topically 2 (two) times daily. 09/19/23   Jayne Vonn DEL, MD  collagenase  (SANTYL ) 250 UNIT/GM ointment Apply to abdominal wound during wound vac changes Q 3days. 02/01/24   Mavis Anes, MD  Continuous  Glucose Receiver (DEXCOM G7 RECEIVER) DEVI Use to monitor BG continuously 02/07/23   Therisa Benton PARAS, NP  Continuous Glucose Sensor (DEXCOM G7 SENSOR) MISC CHANGE SENSOR EVERY 10 DAYS 01/17/24   Nida, Gebreselassie W, MD  doxycycline  (VIBRAMYCIN ) 100 MG capsule Take 100 mg by mouth 2 (two) times daily. 02/29/24   [provider]  Dulaglutide  (TRULICITY ) 0.75 MG/0.5ML SOAJ Inject 0.75 mg into the skin once a week. Patient not taking: Reported on 06/27/2024 06/20/24   Nida, Gebreselassie W, MD  DULoxetine  (CYMBALTA ) 60 MG capsule Take 60 mg by mouth 2 (two) times daily.    [provider]  Emollient (GOLD BOND DIABETICS DRY SKIN) CREA Apply 1 Application topically daily as needed (pain). Uses with aspercreme    [provider]  fluticasone  (FLONASE ) 50 MCG/ACT nasal spray Place 2 sprays into both nostrils daily. 11/05/23   Mayers, Cari S, PA-C  gabapentin  (NEURONTIN ) 800 MG tablet Take 800 mg by mouth 2 (two) times daily. 11/15/22   [provider]  hydrOXYzine  (ATARAX ) 50 MG tablet Take 100 mg by mouth at bedtime.    [provider]  ibuprofen  (ADVIL ) 800 MG tablet Take 1 tablet (800 mg total) by mouth every 8 (eight) hours as needed. 02/07/23   Margrette Taft BRAVO, MD  insulin  aspart (NOVOLOG  FLEXPEN) 100 UNIT/ML FlexPen Inject 10-16 Units into the skin 3 (three) times daily before meals. Patient taking differently: Inject 1-12  Units into the skin 3 (three) times daily before meals. 06/22/23   Nida, Gebreselassie W, MD  insulin  degludec (TRESIBA  FLEXTOUCH) 200 UNIT/ML FlexTouch Pen Inject 90 Units into the skin at bedtime. 06/20/24   Nida, Gebreselassie W, MD  losartan (COZAAR) 25 MG tablet Take 25 mg by mouth daily. 10/30/23   [provider]  Multiple Vitamin (MULTIVITAMIN) tablet Take 1 tablet by mouth daily.    [provider]  mupirocin  ointment (BACTROBAN ) 2 % Apply to abdominal wound during wound vac changes Q 3days. 02/01/24   Mavis Anes,  MD  omeprazole  (PRILOSEC) 20 MG capsule Take 1 capsule (20 mg total) by mouth 2 (two) times daily before a meal. Take 30 min before breakfast and 30 min before dinner 05/15/24 11/11/24  Cindie Carlin POUR, DO  ondansetron  (ZOFRAN ) 4 MG tablet Take 1 tablet (4 mg total) by mouth daily as needed for nausea or vomiting. 01/23/24 01/22/25  Pappayliou, Dorothyann A, DO  ondansetron  (ZOFRAN -ODT) 8 MG disintegrating tablet Take 8 mg by mouth every 8 (eight) hours as needed for nausea or vomiting. 08/18/23   [provider]  oxyCODONE -acetaminophen  (PERCOCET) 10-325 MG tablet Take 1 tablet by mouth every 4 (four) hours as needed for pain.    [provider]  Phenylephrine -Acetaminophen  (TYLENOL  SINUS+HEADACHE PO) Take 2 tablets by mouth 2 (two) times daily as needed (sinus pain).    [provider]  polyethylene glycol (MIRALAX  / GLYCOLAX ) 17 g packet Take 17 g by mouth daily. 01/24/24   Pappayliou, Dorothyann A, DO  rosuvastatin  (CRESTOR ) 10 MG tablet Take 1 tablet (10 mg total) by mouth daily. 08/11/22   Nida, Gebreselassie W, MD  silver  sulfADIAZINE  (SILVADENE ) 1 % cream Use to area 2-3 times daily 01/25/23   Jayne Vonn DEL, MD  SODIUM FLUORIDE 5000 PPM 1.1 % PSTE BRUSH WITH A PEA SIZED AMOUNT FOR 2 MINS. THEN SPIT OUT THOROUGHLY TWICE DAILY. DO NOT RINSE. NOTHING BY MOUTH FOR 30 MINS. 10/30/23   [provider]  solifenacin  (VESICARE ) 10 MG tablet Take 1 tablet (10 mg total) by mouth daily. 07/08/24   Jayne Vonn DEL, MD    Allergies: Patient has no known allergies.    Review of Systems  Musculoskeletal:        Pain, swelling to the plantar aspect of the left foot  All other systems reviewed and are negative.   Updated Vital Signs BP (!) 155/88 (BP Location: Right Arm)   Pulse 100   Temp 98 F (36.7 C) (Temporal)   Resp 16   Ht 5' 7.5 (1.715 m)   Wt 135.6 kg   LMP 06/30/2024 (Approximate)   SpO2 100%   BMI 46.14 kg/m   Physical Exam Vitals and nursing note  reviewed.  Constitutional:      General: She is not in acute distress.    Appearance: Normal appearance. She is not ill-appearing.  HENT:     Head: Normocephalic and atraumatic.     Nose: Nose normal.     Mouth/Throat:     Mouth: Mucous membranes are moist.  Eyes:     Extraocular Movements: Extraocular movements intact.     Conjunctiva/sclera: Conjunctivae normal.     Pupils: Pupils are equal, round, and reactive to light.  Cardiovascular:     Rate and Rhythm: Normal rate and regular rhythm.     Pulses: Normal pulses.     Heart sounds: Normal heart sounds.  Pulmonary:     Effort: Pulmonary effort is normal.  Breath sounds: Normal breath sounds.  Musculoskeletal:        General: Normal range of motion.     Cervical back: Normal range of motion and neck supple.     Comments: Abscess formation noted to the plantar aspect of left foot with surrounding erythema with extension to the dorsal aspect of the foot, DP and PT pulses are 2+ in the left foot, no bony tenderness noted throughout, no skin breakdown or ulceration, sensation intact distally, full range of motion noted throughout  Skin:    General: Skin is warm and dry.  Neurological:     General: No focal deficit present.     Mental Status: She is alert and oriented to person, place, and time. Mental status is at baseline.  Psychiatric:        Mood and Affect: Mood normal.        Behavior: Behavior normal.        Thought Content: Thought content normal.        Judgment: Judgment normal.     (all labs ordered are listed, but only abnormal results are displayed) Labs Reviewed  CULTURE, BLOOD (ROUTINE X 2)  CULTURE, BLOOD (ROUTINE X 2)  CBC WITH DIFFERENTIAL/PLATELET  COMPREHENSIVE METABOLIC PANEL WITH GFR  LACTIC ACID, PLASMA  LACTIC ACID, PLASMA  HCG, SERUM, QUALITATIVE    EKG: None  Radiology: No results found.   .Incision and Drainage  Date/Time: 07/16/2024 12:05 PM  Performed by: Daralene Lonni BIRCH,  PA-C Authorized by: Daralene Lonni BIRCH, PA-C   Consent:    Consent obtained:  Verbal   Consent given by:  Patient   Risks discussed:  Bleeding, incomplete drainage and pain   Alternatives discussed:  No treatment and delayed treatment Universal protocol:    Procedure explained and questions answered to patient or proxy's satisfaction: yes     Immediately prior to procedure, a time out was called: yes     Patient identity confirmed:  Verbally with patient, arm band, provided demographic data and hospital-assigned identification number Location:    Type:  Abscess   Size:  Moderate   Location:  Lower extremity   Lower extremity location:  Foot   Foot location:  L foot Pre-procedure details:    Skin preparation:  Povidone-iodine Sedation:    Sedation type:  None Anesthesia:    Anesthesia method:  None Procedure type:    Complexity:  Simple Procedure details:    Ultrasound guidance: no     Needle aspiration: no     Incision types:  Stab incision and single straight   Incision depth:  Dermal   Wound management:  Probed and deloculated   Drainage:  Purulent   Drainage amount:  Moderate   Wound treatment:  Wound left open   Packing materials:  None Post-procedure details:    Procedure completion:  Tolerated well, no immediate complications    Medications Ordered in the ED  piperacillin -tazobactam (ZOSYN ) IVPB 3.375 g (has no administration in time range)  lidocaine -prilocaine (EMLA) cream (has no administration in time range)  lidocaine  (PF) (XYLOCAINE ) 1 % injection 30 mL (has no administration in time range)  vancomycin  (VANCOREADY) IVPB 2000 mg/400 mL (has no administration in time range)                                    Medical Decision Making Amount and/or Complexity of Data Reviewed Labs: ordered. Radiology: ordered.  Risk Prescription drug management. Decision regarding hospitalization.   This patient presents to the ED for concern of pain, swelling to  left foot, this involves an extensive number of treatment options, and is a complaint that carries with it a high risk of complications and morbidity.  The differential diagnosis includes abscess, cellulitis, necrotizing fasciitis, diabetic foot wound, sepsis   Co morbidities that complicate the patient evaluation  Diabetes   Additional history obtained:  Additional history obtained from none External records from outside source obtained and reviewed including none   Lab Tests:  I Ordered, and personally interpreted labs.  The pertinent results include: No leukocytosis, no anemia, hyperglycemia, unremarkable electrolytes, normal kidney function liver function   Imaging Studies ordered:  I ordered imaging studies including x-ray of left foot I independently visualized and interpreted imaging which showed swelling to the plantar aspect of the foot with apparent foreign body I agree with the radiologist interpretation    Consultations Obtained:  I requested consultation with the hospitalist,  and discussed lab and imaging findings as well as pertinent plan - they recommend: Admission   Problem List / ED Course / Critical interventions / Medication management  Patient is doing well at this time and does remain stable.  Incision and drainage was performed to the plantar aspect of the left foot.  Patient tolerated procedure without difficulty.  Do believe that the piece of glass was removed during the incision and drainage.  Will plan for admission to the hospitalist service given the fact that she is failing outpatient treatment and the worsening of her condition.  She has been given vancomycin  and Zosyn  in the emergency department.  Treatment of her hyperglycemia has been initiated.  She does not meet criteria for DKA or HHS.  Have discussed patient case with Dr. Maree with the hospitalist service who has excepted for admission. I ordered medication including vancomycin , Zosyn , IV  fluids, insulin , Zofran , morphine  for abscess, hyperglycemia Reevaluation of the patient after these medicines showed that the patient improved I have reviewed the patients home medicines and have made adjustments as needed   Social Determinants of Health:  None   Test / Admission - Considered:  Admission     Final diagnoses:  None    ED Discharge Orders     None          Daralene Lonni JONETTA DEVONNA 07/16/24 1207    Suzette Pac, MD 07/18/24 [phone]

## 2024-07-16 NOTE — Inpatient Diabetes Management (Signed)
 Inpatient Diabetes Program Recommendations  AACE/ADA: New Consensus Statement on Inpatient Glycemic Control   Target Ranges:  Prepandial:   less than 140 mg/dL      Peak postprandial:   less than 180 mg/dL (1-2 hours)      Critically ill patients:  140 - 180 mg/dL    Latest Reference Range & Units 07/16/24 10:44  CO2 22 - 32 mmol/L 23  Glucose 70 - 99 mg/dL 626 (H)  Anion gap 5 - 15  12    Review of Glycemic Control  Diabetes history: DM2 Outpatient Diabetes medications: Tresiba  90 units at bedtime, Novolog  1-12 units TID with meals, Trulicity  0.75 mg Qweek Current orders for Inpatient glycemic control: Novolog  0-20 units TID with meals, Novolog  0-5 units at bedtime, Novolog  6 units TID with meals  Inpatient Diabetes Program Recommendations:    Insulin : Please consider ordering insulin  glargine 28 units QHS (based on 138.8 kg x 0.2 units).  NOTE: Patient admitted with left foot cellulitis/abscess and  hyperglycemia. In reviewing chart, noted patient sees Dr. Lenis (Endocrinologist) and was seen on 06/20/24. Per office note on 06/20/24, A1C was 11.2%, She did have significant disengagement from self-care due to depression; increase her basal insulin , switching to Tresiba , at 90 units nightly, increase NovoLog  to 10 -16 units 3 times daily AC, prescribed Trulicity  0.75 mg subcutaneously weekly.   Thanks, Earnie Gainer, RN, MSN, CDCES Diabetes Coordinator Inpatient Diabetes Program [phone] (Team Pager from 8am to 5pm)

## 2024-07-16 NOTE — Progress Notes (Signed)
 Pharmacy Antibiotic Note  Judy Nelson is a 38 y.o. female admitted on 07/16/2024 with cellulitis.  Pharmacy has been consulted for vancomycin  dosing.  Plan: Vancomycin  2000 mg IV x 1 dose. Vancomycin  1500 mg IV every 12 hours. Monitor labs, c/s, and vanco levels as indicated.  Height: 5' 7.5 (171.5 cm) Weight: (!) 138.8 kg (306 lb) IBW/kg (Calculated) : 62.75  Temp (24hrs), Avg:98.2 F (36.8 C), Min:98 F (36.7 C), Max:98.4 F (36.9 C)  Recent Labs  Lab 07/16/24 1044  WBC 10.5  CREATININE 0.72  LATICACIDVEN 1.7    Estimated Creatinine Clearance: 141.7 mL/min (by C-G formula based on SCr of 0.72 mg/dL).    Allergies  Allergen Reactions   Dilaudid  [Hydromorphone ] Itching and Other (See Comments)    Delusions     Antimicrobials this admission: Vanco 9/30 >> CTX 9/30 >>  Microbiology results: 9/30 BCx: pending   Thank you for allowing pharmacy to be a part of this patient's care.  Elspeth Sour, PharmD Clinical Pharmacist 07/16/2024 3:08 PM

## 2024-07-16 NOTE — ED Triage Notes (Signed)
 Pt arrived via POV c/o wound to left foot. Pt reports last month stepping on glass and being evaluated in Urgent Care and receiving a tetanus booster. Pt reports wound has begun oozing and swelling.

## 2024-07-16 NOTE — Consult Note (Addendum)
 WOC Nurse Consult Note: Reason for Consult: Consult requested for left foot wound.   I and D of an abscess was already performed in the ED to left plantar foot; WOC requested to provide topical treatment recommendations. Pt is already on systemic antibiotics. Consult performed remotely after review of progress notes.  Topical treatment orders provided for bedside nurses to perform as follows: Apply Xeroform gauze to left foot Q day, then cover with 4X4 and kerlex.   Please re-consult if further assistance is needed.  Thank-you,  Stephane Fought MSN, RN, CWOCN, CWCN-AP, CNS Contact Mon-Fri 0700-1500: [phone]

## 2024-07-17 DIAGNOSIS — I10 Essential (primary) hypertension: Secondary | ICD-10-CM

## 2024-07-17 DIAGNOSIS — E1165 Type 2 diabetes mellitus with hyperglycemia: Secondary | ICD-10-CM | POA: Diagnosis not present

## 2024-07-17 DIAGNOSIS — L03032 Cellulitis of left toe: Secondary | ICD-10-CM

## 2024-07-17 LAB — BLOOD CULTURE ID PANEL (REFLEXED) - BCID2

## 2024-07-17 LAB — BASIC METABOLIC PANEL WITH GFR
Anion gap: 11 (ref 5–15)
BUN: 6 mg/dL (ref 6–20)
CO2: 24 mmol/L (ref 22–32)
Calcium: 9.1 mg/dL (ref 8.9–10.3)
Chloride: 98 mmol/L (ref 98–111)
Creatinine, Ser: 0.74 mg/dL (ref 0.44–1.00)
GFR, Estimated: >60 mL/min (ref >60)
Glucose, Bld: 260 mg/dL — ABNORMAL HIGH (ref 70–99)
Potassium: 3.8 mmol/L (ref 3.5–5.1)
Sodium: 134 mmol/L — ABNORMAL LOW (ref 135–145)

## 2024-07-17 LAB — CBC
HCT: 33.8 % — ABNORMAL LOW (ref 36.0–46.0)
Hemoglobin: 10.8 g/dL — ABNORMAL LOW (ref 12.0–15.0)
MCH: 26.9 pg (ref 26.0–34.0)
MCHC: 32 g/dL (ref 30.0–36.0)
MCV: 84.3 fL (ref 80.0–100.0)
Platelets: 269 K/uL (ref 150–400)
RBC: 4.01 MIL/uL (ref 3.87–5.11)
RDW: 12.7 % (ref 11.5–15.5)
WBC: 10.6 K/uL — ABNORMAL HIGH (ref 4.0–10.5)
nRBC: 0 % (ref 0.0–0.2)

## 2024-07-17 LAB — GLUCOSE, CAPILLARY
Glucose-Capillary: 241 mg/dL — ABNORMAL HIGH (ref 70–99)
Glucose-Capillary: 258 mg/dL — ABNORMAL HIGH (ref 70–99)

## 2024-07-17 LAB — MAGNESIUM: Magnesium: 1.6 mg/dL — ABNORMAL LOW (ref 1.7–2.4)

## 2024-07-17 MED ORDER — GABAPENTIN 800 MG PO TABS: 800.0000 mg | ORAL_TABLET | Freq: Two times a day (BID) | ORAL | 5 refills | Status: AC

## 2024-07-17 MED ORDER — DOXYCYCLINE HYCLATE 100 MG PO TABS: 100.0000 mg | ORAL_TABLET | Freq: Two times a day (BID) | ORAL | 0 refills | Status: AC

## 2024-07-17 MED ORDER — MAGNESIUM SULFATE 2 GM/50ML IV SOLN
2.0000 g | Freq: Once | INTRAVENOUS | Status: AC
  Administered 2024-07-17: 2 g via INTRAVENOUS
  Filled 2024-07-17: qty 50

## 2024-07-17 MED ORDER — IBUPROFEN 200 MG PO TABS: 200.0000 mg | ORAL_TABLET | Freq: Three times a day (TID) | ORAL | 2 refills | Status: AC | PRN

## 2024-07-17 MED ORDER — HYDROXYZINE HCL 50 MG PO TABS: 100.0000 mg | ORAL_TABLET | Freq: Every day | ORAL | 5 refills | Status: AC

## 2024-07-17 MED ORDER — ALBUTEROL SULFATE HFA 108 (90 BASE) MCG/ACT IN AERS: 2.0000 | INHALATION_SPRAY | Freq: Four times a day (QID) | RESPIRATORY_TRACT | 3 refills | Status: AC | PRN

## 2024-07-17 MED ORDER — ROSUVASTATIN CALCIUM 10 MG PO TABS: 10.0000 mg | ORAL_TABLET | Freq: Every day | ORAL | 1 refills | Status: AC

## 2024-07-17 MED ORDER — SOLIFENACIN SUCCINATE 10 MG PO TABS: 10.0000 mg | ORAL_TABLET | Freq: Every day | ORAL | 3 refills | Status: AC

## 2024-07-17 MED ORDER — CEPHALEXIN 500 MG PO CAPS
500.0000 mg | ORAL_CAPSULE | Freq: Three times a day (TID) | ORAL | 0 refills | Status: DC
Start: 2024-07-17 — End: 2024-07-22

## 2024-07-17 NOTE — Inpatient Diabetes Management (Addendum)
 Inpatient Diabetes Program Recommendations  AACE/ADA: New Consensus Statement on Inpatient Glycemic Control   Target Ranges:  Prepandial:   less than 140 mg/dL      Peak postprandial:   less than 180 mg/dL (1-2 hours)      Critically ill patients:  140 - 180 mg/dL    Latest Reference Range & Units 07/16/24 17:31 07/16/24 20:10 07/17/24 07:33  Glucose-Capillary 70 - 99 mg/dL 773 (H) 843 (H) 758 (H)    Latest Reference Range & Units 07/16/24 10:44  Hemoglobin A1C 4.8 - 5.6 % 10.7 (H)    Review of Glycemic Control  Diabetes history: DM2 Outpatient Diabetes medications: Lantus  80 units at bedtime, Tresiba  90 units at bedtime (will switch from Lantus  to Tresiba  once insurance approves),  Novolog  1-12 units TID with meals, Trulicity  0.75 mg Qweek (has not started yet; pharmacy did not have it in stock after insurance approved) Current orders for Inpatient glycemic control: Lantus  28 units at bedtime, Novolog  0-20 units TID with meals, Novolog  0-5 units at bedtime, Novolog  6 units TID with meals   Inpatient Diabetes Program Recommendations:     Insulin : Please consider increasing Lantus  to 32 units at bedtime.   HbgA1C: A1C 10.7% on 07/16/24 indicating an average glucose of 260 mg/dl over the past 2-3 months.  NOTE: Patient admitted with left foot cellulitis/abscess and  hyperglycemia. In reviewing chart, noted patient sees Dr. Lenis (Endocrinologist) and was seen on 06/20/24. Per office note on 06/20/24, A1C was 11.2%, She did have significant disengagement from self-care due to depression; increase her basal insulin , switching to Tresiba , at 90 units nightly, increase NovoLog  to 10 -16 units 3 times daily AC, prescribed Trulicity  0.75 mg subcutaneously weekly.    Addendum 07/17/24@11 :45-Spoke with patient regarding DM control. Patient reports that she is currently taking Lantus  80 units at bedtime and Novolog  0-12 units TID with meals. Patient states that Dr. Lenis was seen on 06/20/24 and he wanted to  switch her basal insulin  from Lantus  to Tresiba  and also wanted her to start on Trulicity .  Patient states that her insurance has not approved Tresiba  yet so she continues to take the Lantus . She states that the Trulicity  was approved by insurance but the pharmacy did not have it in stock. Patient states she is planning to call the pharmacy today about filling the Trulicity .  Patient states she has been on a wound vac for several months and has been in significant pain which has contributed to hyperglycemia.  Patient also notes that she is having anxiety about injecting insulin  as she had necrosis of abdomen tissue and they are not sure if it was due to insulin  injections. Patient has anxiety about injecting insulin  but she is trying to be diligent about taking it and moving it around to different locations.  Patient reports that her glucose has been better and she is working with Dr. Lenis to get DM under control. Discussed current A1C of 10.9% on 07/16/24. Patient feels her A1C is elevated due to her pain, recent infections, and her anxiety about injecting insulin .  Encouraged patient to stay in close contact with Dr. Lenis for assistance with getting DM under better control so that her foot wound heals and to decrease risk of further complications.  Patient verbalized understanding and has no questions at this time.  Thanks, Earnie Gainer, RN, MSN, CDCES Diabetes Coordinator Inpatient Diabetes Program [phone] (Team Pager from 8am to 5pm)

## 2024-07-17 NOTE — Discharge Instructions (Addendum)
 -1)Please call or return if  fever  Or chills or increased Lt foot swelling and/or redness, drainage or pain 2)Please call  Endocrinologist Dr. Ethelle Earl, MD, Endocrinology, Diabetes & Metabolism-- [facility], [address] [place], [place] [address], Phone Number- tel:[phone] 3)Apply Xeroform gauze to left foot once daily, then cover with 4X4 and kerlex.   Plate Method for Diabetes   Foods with carbohydrates make your blood glucose level go up. The plate method is a simple way to meal plan and control the amount of carbohydrate you eat.         Use the following guidance to build a healthy plate to control carbohydrates. Divide a 9-inch plate into 3 sections, and consider your beverage the 4th section of your meal: Food Group Examples of Foods/Beverages for This Section of your Meal  Section 1: Non-starchy vegetables Fill  of your plate to include non-starchy vegetables Asparagus, broccoli, brussels sprouts, cabbage, carrots, cauliflower, celery, cucumber, green beans, mushrooms, peppers, salad greens, tomatoes, or zucchini.  Section 2: Protein foods Fill  of your plate to include a lean protein Lean meat, poultry, fish, seafood, cheese, eggs, lean deli meat, tofu, beans, lentils, nuts or nut butters.  Section 3: Carbohydrate foods Fill  of your plate to include carbohydrate foods Whole grains, whole wheat bread, brown rice, whole grain pasta, polenta, corn tortillas, fruit, or starchy vegetables (potatoes, green peas, corn, beans, acorn squash, and butternut squash). One cup of milk also counts as a food that contains carbohydrate.  Section 4: Beverage Choose water or a low-calorie drink for your beverage. Unsweetened tea, coffee, or flavored/sparkling water without added sugar.  Image reprinted with permission from The American Diabetes Association.  Copyright 2022 by the American Diabetes Association.   Copyright 2022  Academy of Nutrition and Dietetics. All rights reserved     Carbohydrate Counting For People With Diabetes  Foods with carbohydrates make your blood glucose level go up. Learning how to count carbohydrates can help you control your blood glucose levels. First, identify the foods you eat that contain carbohydrates. Then, using the Foods with Carbohydrates chart, determine about how much carbohydrates are in your meals and snacks. Make sure you are eating foods with fiber, protein, and healthy fat along with your carbohydrate foods. Foods with Carbohydrates The following table shows carbohydrate foods that have about 15 grams of carbohydrate each. Using measuring cups, spoons, or a food scale when you first begin learning about carbohydrate counting can help you learn about the portion sizes you typically eat. The following foods have 15 grams carbohydrate each:  Grains 1 slice bread (1 ounce)  1 small tortilla (6-inch size)   large bagel (1 ounce)  1/3 cup pasta or rice (cooked)   hamburger or hot dog bun ( ounce)   cup cooked cereal   to  cup ready-to-eat cereal  2 taco shells (5-inch size) Fruit 1 small fresh fruit ( to 1 cup)   medium banana  17 small grapes (3 ounces)  1 cup melon or berries   cup canned or frozen fruit  2 tablespoons dried fruit (blueberries, cherries, cranberries, raisins)   cup unsweetened fruit juice  Starchy Vegetables  cup cooked beans, peas, corn, potatoes/sweet potatoes   large baked potato (3 ounces)  1 cup acorn or butternut squash  Snack Foods 3 to 6 crackers  8 potato chips or 13 tortilla chips ( ounce to 1 ounce)  3 cups popped popcorn  Dairy 3/4 cup (6 ounces) nonfat plain  yogurt, or yogurt with sugar-free sweetener  1 cup milk  1 cup plain rice, soy, coconut or flavored almond milk Sweets and Desserts  cup ice cream or frozen yogurt  1 tablespoon jam, jelly, pancake syrup, table sugar, or honey  2 tablespoons light pancake syrup  1 inch square of frosted cake or 2 inch square of  unfrosted cake  2 small cookies (2/3 ounce each) or  large cookie  Sometimes you'll have to estimate carbohydrate amounts if you don't know the exact recipe. One cup of mixed foods like soups can have 1 to 2 carbohydrate servings, while some casseroles might have 2 or more servings of carbohydrate. Foods that have less than 20 calories in each serving can be counted as "free" foods. Count 1 cup raw vegetables, or  cup cooked non-starchy vegetables as "free" foods. If you eat 3 or more servings at one meal, then count them as 1 carbohydrate serving.  Foods without Carbohydrates  Not all foods contain carbohydrates. Meat, some dairy, fats, non-starchy vegetables, and many beverages don't contain carbohydrate. So when you count carbohydrates, you can generally exclude chicken, pork, beef, fish, seafood, eggs, tofu, cheese, butter, sour cream, avocado, nuts, seeds, olives, mayonnaise, water, black coffee, unsweetened tea, and zero-calorie drinks. Vegetables with no or low carbohydrate include green beans, cauliflower, tomatoes, and onions. How much carbohydrate should I eat at each meal?  Carbohydrate counting can help you plan your meals and manage your weight. Following are some starting points for carbohydrate intake at each meal. Work with your registered dietitian nutritionist to find the best range that works for your blood glucose and weight.   To Lose Weight To Maintain Weight  Women 2 - 3 carb servings 3 - 4 carb servings  Men 3 - 4 carb servings 4 - 5 carb servings  Checking your blood glucose after meals will help you know if you need to adjust the timing, type, or number of carbohydrate servings in your meal plan. Achieve and keep a healthy body weight by balancing your food intake and physical activity.  Tips How should I plan my meals?  Plan for half the food on your plate to include non-starchy vegetables, like salad greens, broccoli, or carrots. Try to eat 3 to 5 servings of non-starchy  vegetables every day. Have a protein food at each meal. Protein foods include chicken, fish, meat, eggs, or beans (note that beans contain carbohydrate). These two food groups (non-starchy vegetables and proteins) are low in carbohydrate. If you fill up your plate with these foods, you will eat less carbohydrate but still fill up your stomach. Try to limit your carbohydrate portion to  of the plate.  What fats are healthiest to eat?  Diabetes increases risk for heart disease. To help protect your heart, eat more healthy fats, such as olive oil, nuts, and avocado. Eat less saturated fats like butter, cream, and high-fat meats, like bacon and sausage. Avoid trans fats, which are in all foods that list "partially hydrogenated oil" as an ingredient. What should I drink?  Choose drinks that are not sweetened with sugar. The healthiest choices are water, carbonated or seltzer waters, and tea and coffee without added sugars.  Sweet drinks will make your blood glucose go up very quickly. One serving of soda or energy drink is  cup. It is best to drink these beverages only if your blood glucose is low.  Artificially sweetened, or diet drinks, typically do not increase your blood glucose if they  have zero calories in them. Read labels of beverages, as some diet drinks do have carbohydrate and will raise your blood glucose. Label Reading Tips Read Nutrition Facts labels to find out how many grams of carbohydrate are in a food you want to eat. Don't forget: sometimes serving sizes on the label aren't the same as how much food you are going to eat, so you may need to calculate how much carbohydrate is in the food you are serving yourself.   Carbohydrate Counting for People with Diabetes Sample 1-Day Menu  Breakfast  cup yogurt, low fat, low sugar (1 carbohydrate serving)   cup cereal, ready-to-eat, unsweetened (1 carbohydrate serving)  1 cup strawberries (1 carbohydrate serving)   cup almonds ( carbohydrate  serving)  Lunch 1, 5 ounce can chunk light tuna  2 ounces cheese, low fat cheddar  6 whole wheat crackers (1 carbohydrate serving)  1 small apple (1 carbohydrate servings)   cup carrots ( carbohydrate serving)   cup snap peas  1 cup 1% milk (1 carbohydrate serving)   Evening Meal Stir fry made with: 3 ounces chicken  1 cup brown rice (3 carbohydrate servings)   cup broccoli ( carbohydrate serving)   cup green beans   cup onions  1 tablespoon olive oil  2 tablespoons teriyaki sauce ( carbohydrate serving)  Evening Snack 1 extra small banana (1 carbohydrate serving)  1 tablespoon peanut butter   Carbohydrate Counting for People with Diabetes Vegan Sample 1-Day Menu  Breakfast 1 cup cooked oatmeal (2 carbohydrate servings)   cup blueberries (1 carbohydrate serving)  2 tablespoons flaxseeds  1 cup soymilk fortified with calcium  and vitamin D  1 cup coffee  Lunch 2 slices whole wheat bread (2 carbohydrate servings)   cup baked tofu   cup lettuce  2 slices tomato  2 slices avocado   cup baby carrots ( carbohydrate serving)  1 orange (1 carbohydrate serving)  1 cup soymilk fortified with calcium  and vitamin D   Evening Meal Burrito made with: 1 6-inch corn tortilla (1 carbohydrate serving)  1 cup refried vegetarian beans (2 carbohydrate servings)   cup chopped tomatoes   cup lettuce   cup salsa  1/3 cup brown rice (1 carbohydrate serving)  1 tablespoon olive oil for rice   cup zucchini   Evening Snack 6 small whole grain crackers (1 carbohydrate serving)  2 apricots ( carbohydrate serving)   cup unsalted peanuts ( carbohydrate serving)    Carbohydrate Counting for People with Diabetes Vegetarian (Lacto-Ovo) Sample 1-Day Menu  Breakfast 1 cup cooked oatmeal (2 carbohydrate servings)   cup blueberries (1 carbohydrate serving)  2 tablespoons flaxseeds  1 egg  1 cup 1% milk (1 carbohydrate serving)  1 cup coffee  Lunch 2 slices whole wheat bread (2  carbohydrate servings)  2 ounces low-fat cheese   cup lettuce  2 slices tomato  2 slices avocado   cup baby carrots ( carbohydrate serving)  1 orange (1 carbohydrate serving)  1 cup unsweetened tea  Evening Meal Burrito made with: 1 6-inch corn tortilla (1 carbohydrate serving)   cup refried vegetarian beans (1 carbohydrate serving)   cup tomatoes   cup lettuce   cup salsa  1/3 cup brown rice (1 carbohydrate serving)  1 tablespoon olive oil for rice   cup zucchini  1 cup 1% milk (1 carbohydrate serving)  Evening Snack 6 small whole grain crackers (1 carbohydrate serving)  2 apricots ( carbohydrate serving)   cup unsalted peanuts (  carbohydrate serving)    Copyright 2020  Academy of Nutrition and Dietetics. All rights reserved.  Using Nutrition Labels: Carbohydrate  Serving Size  Look at the serving size. All the information on the label is based on this portion. Servings Per Container  The number of servings contained in the package. Guidelines for Carbohydrate  Look at the total grams of carbohydrate in the serving size.  1 carbohydrate choice = 15 grams of carbohydrate. Range of Carbohydrate Grams Per Choice  Carbohydrate Grams/Choice Carbohydrate Choices  6-10   11-20 1  21-25 1  26-35 2  36-40 2  41-50 3  51-55 3  56-65 4  66-70 4  71-80 5    Copyright 2020  Academy of Nutrition and Dietetics. All rights reserved.

## 2024-07-17 NOTE — Progress Notes (Addendum)
 PHARMACY - PHYSICIAN COMMUNICATION CRITICAL VALUE ALERT - BLOOD CULTURE IDENTIFICATION (BCID)  Judy Nelson is an 38 y.o. female who presented to [facility] on 07/16/2024   Assessment:  BCID strep species 1/4 bottles  Name of physician (or Provider) Contacted: Dr. Pearlean  Current antibiotics: Ceftriaxone and vancomycin   Changes to prescribed antibiotics recommended:  Recommendations accepted by provider- MD discharging patient on keflex /doxy  Results for orders placed or performed during the hospital encounter of 07/16/24  Blood Culture ID Panel (Reflexed) (Collected: 07/16/2024 10:44 AM)  Result Value Ref Range   Enterococcus faecalis NOT DETECTED NOT DETECTED   Enterococcus Faecium NOT DETECTED NOT DETECTED   Listeria monocytogenes NOT DETECTED NOT DETECTED   Staphylococcus species NOT DETECTED NOT DETECTED   Staphylococcus aureus (BCID) NOT DETECTED NOT DETECTED   Staphylococcus epidermidis NOT DETECTED NOT DETECTED   Staphylococcus lugdunensis NOT DETECTED NOT DETECTED   Streptococcus species DETECTED (A) NOT DETECTED   Streptococcus agalactiae NOT DETECTED NOT DETECTED   Streptococcus pneumoniae NOT DETECTED NOT DETECTED   Streptococcus pyogenes NOT DETECTED NOT DETECTED   A.calcoaceticus-baumannii NOT DETECTED NOT DETECTED   Bacteroides fragilis NOT DETECTED NOT DETECTED   Enterobacterales NOT DETECTED NOT DETECTED   Enterobacter cloacae complex NOT DETECTED NOT DETECTED   Escherichia coli NOT DETECTED NOT DETECTED   Klebsiella aerogenes NOT DETECTED NOT DETECTED   Klebsiella oxytoca NOT DETECTED NOT DETECTED   Klebsiella pneumoniae NOT DETECTED NOT DETECTED   Proteus species NOT DETECTED NOT DETECTED   Salmonella species NOT DETECTED NOT DETECTED   Serratia marcescens NOT DETECTED NOT DETECTED   Haemophilus influenzae NOT DETECTED NOT DETECTED   Neisseria meningitidis NOT DETECTED NOT DETECTED   Pseudomonas aeruginosa NOT DETECTED NOT DETECTED   Stenotrophomonas  maltophilia NOT DETECTED NOT DETECTED   Candida albicans NOT DETECTED NOT DETECTED   Candida auris NOT DETECTED NOT DETECTED   Candida glabrata NOT DETECTED NOT DETECTED   Candida krusei NOT DETECTED NOT DETECTED   Candida parapsilosis NOT DETECTED NOT DETECTED   Candida tropicalis NOT DETECTED NOT DETECTED   Cryptococcus neoformans/gattii NOT DETECTED NOT DETECTED    Elspeth JAYSON Sour 07/17/2024  2:02 PM

## 2024-07-17 NOTE — Progress Notes (Signed)
 At 1431: RN reviewed discharge with patient and she verbalized understanding. Patient is waiting on ride, Charge RN [facility] aware

## 2024-07-17 NOTE — Progress Notes (Addendum)
   07/17/24 1053  TOC Brief Assessment  Insurance and Status Reviewed  Patient has primary care physician Yes  Home environment has been reviewed Home with family  Prior level of function: Independent  Prior/Current Home Services No current home services  Social Drivers of Health Review SDOH reviewed no interventions necessary  Readmission risk has been reviewed Yes  Transition of care needs no transition of care needs at this time   Inpatient Care Manager (ICM) has reviewed patient and no ICM needs have been identified at this time. We will continue to monitor patient advancement through interdisciplinary progression rounds. If new patient transition needs arise, please place a ICM consult.

## 2024-07-19 LAB — CULTURE, BLOOD (ROUTINE X 2): Special Requests: ADEQUATE

## 2024-07-21 ENCOUNTER — Other Ambulatory Visit: Payer: Self-pay | Admitting: "Endocrinology

## 2024-07-21 DIAGNOSIS — E1165 Type 2 diabetes mellitus with hyperglycemia: Secondary | ICD-10-CM

## 2024-07-21 LAB — CULTURE, BLOOD (ROUTINE X 2)
Culture: NO GROWTH
Special Requests: ADEQUATE

## 2024-07-21 LAB — AEROBIC/ANAEROBIC CULTURE W GRAM STAIN (SURGICAL/DEEP WOUND)

## 2024-07-22 ENCOUNTER — Encounter: Payer: Self-pay | Admitting: Podiatry

## 2024-07-22 ENCOUNTER — Ambulatory Visit: Admitting: Podiatry

## 2024-07-22 VITALS — Ht 67.5 in | Wt 306.0 lb

## 2024-07-22 DIAGNOSIS — E08621 Diabetes mellitus due to underlying condition with foot ulcer: Secondary | ICD-10-CM

## 2024-07-22 DIAGNOSIS — L97422 Non-pressure chronic ulcer of left heel and midfoot with fat layer exposed: Secondary | ICD-10-CM

## 2024-07-22 MED ORDER — CEPHALEXIN 500 MG PO CAPS: 500.0000 mg | ORAL_CAPSULE | Freq: Three times a day (TID) | ORAL | 0 refills | Status: AC

## 2024-07-22 NOTE — Progress Notes (Signed)
 Chief Complaint  Patient presents with   Wound Check    Pt is here due to left foot, states she step on glass 2 months ago, thought she got it all out, was seen in the ED last Tuesday after not being able to walk on the foot, was told that glass was imbedded in her foot and it was infected, glass was removed put on antibiotics for 7 days. She says her foot feels a lot better now.    Subjective:  38 y.o. female with PMHx of diabetes mellitus; uncontrolled presenting for evaluation of a piece of glass shard which developed into an abscess to the plantar aspect of the left foot.  Last seen in the emergency department on 07/16/2024.  She was placed on antibiotics and referred here.   Lab Results  Component Value Date   HGBA1C 10.7 (H) 07/16/2024   HGBA1C 8.7 (H) 01/20/2024   HGBA1C 8.1 (A) 10/25/2023   Past Medical History:  Diagnosis Date   Anemia    Anxiety    Asthma    CSF leak    Depression    GERD (gastroesophageal reflux disease)    Heartburn 09/01/2015   no current med.   History of anemia 01/2014   Hypertension    Non-insulin  dependent type 2 diabetes mellitus (HCC)    Obesity    PTSD (post-traumatic stress disorder)    Scar of breast 08/2015   left   Sinus headache     Past Surgical History:  Procedure Laterality Date   BREAST SURGERY Bilateral    bilateral breast surgery to remove cyst at [facility] 2017   CRANIOTOMY N/A 06/30/2017   Procedure: BIFRONTAL CRANIOTOMY;  Surgeon: Lanis Pupa, MD;  Location: [facility] OR;  Service: Neurosurgery;  Laterality: N/A;  BIFRONTAL CRANIOTOMY Repair of Encephalocele   ESOPHAGEAL DILATION N/A 05/15/2024   Procedure: DILATION, ESOPHAGUS;  Surgeon: Cindie Carlin POUR, DO;  Location: [facility] SU[facility];  Service: Endoscopy;  Laterality: N/A;   ESOPHAGOGASTRODUODENOSCOPY N/A 05/15/2024   Procedure: EGD (ESOPHAGOGASTRODUODENOSCOPY);  Surgeon: Cindie Carlin POUR, DO;  Location: [facility];  Service: Endoscopy;  Laterality: N/A;  1:00pm, asa  3   GANGLION CYST EXCISION Left 02/07/2023   Procedure: REMOVAL GANGLION OF WRIST;  Surgeon: Margrette Taft BRAVO, MD;  Location: [facility] ORS;  Service: Orthopedics;  Laterality: Left;  LMA   INCISION AND DRAINAGE ABSCESS Left 10/07/2014   Procedure: INCISION AND DRAINAGE LEFT BREAST ABSCESS;  Surgeon: Oneil DELENA Budge, MD;  Location: [facility] ORS;  Service: General;  Laterality: Left;   INCISION AND DRAINAGE ABSCESS N/A 01/19/2024   Procedure: INCISION AND DRAINAGE ABDOMINAL WALL ABSCESS;  Surgeon: Evonnie Dorothyann DELENA, DO;  Location: [facility] ORS;  Service: General;  Laterality: N/A;   LAPAROSCOPIC OVARIAN CYSTECTOMY Right 02/05/2014   Procedure: LAPAROSCOPIC right retroperitoneal (NOT OVARIAN) CYSTECTOMY;  Surgeon: Vonn VEAR Inch, MD;  Location: [facility] ORS;  Service: Gynecology;  Laterality: Right;   MASS EXCISION Left 09/07/2015   Procedure: Minor SCAR EXCISION LEFT BREAST, PLASTIC CLOSURE;  Surgeon: Elna Pick, MD;  Location: [facility];  Service: Plastics;  Laterality: Left;   TYMPANOSTOMY TUBE PLACEMENT Bilateral 10/17/2014   WOUND DEBRIDEMENT N/A 01/19/2024   Procedure: DEBRIDEMENT OF ABDOMINAL WALL NECROTIZING SOFT TISSUE INFECTION, 25 X 20 X 8 CM;  Surgeon: Evonnie Dorothyann DELENA, DO;  Location: [facility] ORS;  Service: General;  Laterality: N/A;    Allergies  Allergen Reactions   Dilaudid  [Hydromorphone ] Itching and Other (See Comments)    Delusions  LT foot 07/22/2024  Objective/Physical Exam General: The patient is alert and oriented x3 in no acute distress.  Dermatology:  Wound #1 noted to the plantar aspect of the left foot measuring approximately 1.5 x 0.9 x 2.0 cm (LxWxD).  Tunneling noted to the plantar aspect of the foot which is about 2 cm in depth  To the noted ulceration(s), there is no eschar. There is a moderate amount of slough, fibrin, and necrotic tissue noted. Granulation tissue and wound base is red. There is a minimal amount of serosanguineous drainage noted. There  is no exposed bone muscle-tendon ligament or joint. There is no malodor. Periwound integrity is intact. Skin is warm, dry and supple bilateral lower extremities.  Vascular: Palpable pedal pulses bilaterally. No edema or erythema noted. Capillary refill within normal limits.  Clinically no concern for vascular compromise  Neurological: Light touch and protective threshold diminished bilaterally.   Musculoskeletal Exam: Patient ambulatory.  No prior amputations  Assessment: 1.  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 diabetes mellitus w/ peripheral neuropathy 3.  Foreign body glass left foot   Plan of Care:  -Patient was evaluated. -Debridement of the plantar ulcer was performed today and an additional piece of glass was removed approximately 5 mm in length.   -Medically necessary excisional debridement including subcutaneous tissue was performed using a tissue nipper and a chisel blade. Excisional debridement of all the necrotic nonviable tissue down to healthy bleeding viable tissue was performed with post-debridement measurements same as pre-. -The wound was cleansed and dry sterile dressing applied. -Recommend painting the wound daily with Betadine and applying Xeroform with a light dressing.  Supplies provided -Refrain from going barefoot.  Recommend good supportive tennis shoes and sneakers. -Return to clinic 3 weeks follow-up x-rays   Thresa EMERSON Sar, DPM [facility]  Dr. Thresa EMERSON Sar, DPM    [address] [place], [place] [address]                Office [phone]  Fax [phone]

## 2024-07-24 NOTE — Discharge Summary (Signed)
 Judy Nelson, is a 38 y.o. female  DOB 01-16-1986  MRN [id number].  Admission date:  07/16/2024  Admitting Physician  Adron JONETTA Fairly, DO  Discharge Date:  07/24/2024   Primary MD  Dow Longs, PA-C  Recommendations for primary care physician for things to follow:  -1)Please call or return if fever  Or chills or increased Lt foot swelling and/or redness, drainage or pain 2)Please call  Endocrinologist Dr. Ethelle Earl, MD, Endocrinology, Diabetes & Metabolism-- [facility], [address] [place], [place] [address], Phone Number- [phone] 3)Apply Xeroform gauze to left foot once daily, then cover with 4X4 and kerlex.  Admission Diagnosis  Cellulitis [L03.90] Hyperglycemia [R73.9] Abscess of left foot [L02.612] Cellulitis of left foot [L03.116]   Discharge Diagnosis  Cellulitis [L03.90] Hyperglycemia [R73.9] Abscess of left foot [L02.612] Cellulitis of left foot [L03.116]    Principal Problem:   Cellulitis Active Problems:   Morbid obesity (HCC)   Uncontrolled type 2 diabetes mellitus with hyperglycemia (HCC)   Essential hypertension, benign   Mixed hyperlipidemia      Past Medical History:  Diagnosis Date   Anemia    Anxiety    Asthma    CSF leak    Depression    GERD (gastroesophageal reflux disease)    Heartburn 09/01/2015   no current med.   History of anemia 01/2014   Hypertension    Non-insulin  dependent type 2 diabetes mellitus (HCC)    Obesity    PTSD (post-traumatic stress disorder)    Scar of breast 08/2015   left   Sinus headache     Past Surgical History:  Procedure Laterality Date   BREAST SURGERY Bilateral    bilateral breast surgery to remove cyst [facility] 2017   CRANIOTOMY N/A 06/30/2017   Procedure: BIFRONTAL CRANIOTOMY;  Surgeon: Lanis Pupa, MD;  Location: [facility] OR;  Service: Neurosurgery;  Laterality: N/A;  BIFRONTAL CRANIOTOMY Repair of  Encephalocele   ESOPHAGEAL DILATION N/A 05/15/2024   Procedure: DILATION, ESOPHAGUS;  Surgeon: Cindie Carlin POUR, DO;  [facility];  Service: Endoscopy;  Laterality: N/A;   ESOPHAGOGASTRODUODENOSCOPY N/A 05/15/2024   Procedure: EGD (ESOPHAGOGASTRODUODENOSCOPY);  Surgeon: Cindie Carlin POUR, DO;  Location: [facility];  Service: Endoscopy;  Laterality: N/A;  1:00pm, asa 3   GANGLION CYST EXCISION Left 02/07/2023   Procedure: REMOVAL GANGLION OF WRIST;  Surgeon: Margrette Taft BRAVO, MD;  Location: [facility] ORS;  Service: Orthopedics;  Laterality: Left;  LMA   INCISION AND DRAINAGE ABSCESS Left 10/07/2014   Procedure: INCISION AND DRAINAGE LEFT BREAST ABSCESS;  Surgeon: Oneil DELENA Budge, MD;  Location: [facility] ORS;  Service: General;  Laterality: Left;   INCISION AND DRAINAGE ABSCESS N/A 01/19/2024   Procedure: INCISION AND DRAINAGE ABDOMINAL WALL ABSCESS;  Surgeon: Evonnie Dorothyann DELENA, DO;  Location: [facility] ORS;  Service: General;  Laterality: N/A;   LAPAROSCOPIC OVARIAN CYSTECTOMY Right 02/05/2014   Procedure: LAPAROSCOPIC right retroperitoneal (NOT OVARIAN) CYSTECTOMY;  Surgeon: Vonn VEAR Inch, MD;  Location: [facility] OR[facility];  Service: Gynecology;  Laterality: Right;  MASS EXCISION Left 09/07/2015   Procedure: Minor SCAR EXCISION LEFT BREAST, PLASTIC CLOSURE;  Surgeon: Elna Pick, MD;  Location: [facility];  Service: Plastics;  Laterality: Left;   TYMPANOSTOMY TUBE PLACEMENT Bilateral 10/17/2014   WOUND DEBRIDEMENT N/A 01/19/2024   Procedure: DEBRIDEMENT OF ABDOMINAL WALL NECROTIZING SOFT TISSUE INFECTION, 25 X 20 X 8 CM;  Surgeon: Evonnie Dorothyann LABOR, DO;  Location: [facility];  Service: General;  Laterality: N/A;       HPI  from the history and physical done on the day of admission:   Chief Complaint: Left foot pain with drainage   HPI: Judy Nelson is a 38 y.o. female with medical history significant for poorly controlled, insulin -dependent-type 2 diabetes, hypertension, GERD,  asthma, and dyslipidemia who presented to the ED for worsening pain and swelling to the plantar aspect of her left foot.  Apparently about 1 month ago she stepped on a piece of glass and was seen at the urgent care and had her tetanus shot updated.  She did have some worsening swelling and pain around this area and she was seen again at urgent care 1 week ago at which time incision and drainage was performed and she was placed on doxycycline  and has finished a full course over the last week.  She still continues to notice worsening swelling since that time and has now been having intermittent fevers at home for which she presented.  She has been trying to take good care of her wound at home, but has otherwise been busy and states that she does not check her blood glucose at home regularly and has not been taking her insulin  as she should because she had some areas of necrosis related to use of her insulin  pen and is now scared to use it.  She is working on this with her therapist and endocrinologist.  She states that her recent A1c was over 11%.   ED Course: Vital signs are stable and patient is afebrile.  Glucose 373 and sodium 133 and no leukocytosis noted.  Left foot x-ray showing small shard of glass and some thickening that is focal to the area of infection.  She underwent some incision and drainage.  She was given a dose of IV Zosyn  and vancomycin  in the ED.   Review of Systems: Reviewed as noted above, otherwise negative    Hospital Course:     Assessment and Plan: 1)Left foot cellulitis/abscess---in a patient with uncontrolled diabetic - Status post I/D in ED 9/30 - I Obtained wound culture today -Treated with Rocephin/Vanco -Okay to discharge on Keflex /doxycycline  pending culture data   2)DM2---A1c 10..7 reflecting uncontrolled DM with hyperglycemia - Concerns about medication and lifestyle as well as diet compliance -Diabetic educator input appreciated - Follow-up with PCP for further  adjustments   Mild hyponatremia-likely pseudohyponatremia - In the setting of hyperglycemia as noted above - Control blood glucose and monitor   Dyslipidemia - Continue Crestor    Tobacco abuse - Counseled on cessation   GERD - PPI  Mild Chronic Anemia--stable, no bleeding concerns at this time   Morbid Obesity- -Low calorie diet, portion control and increase physical activity discussed with patient --Used to be on Ozempic  and now discontinued -Body mass index is 47.22 kg/m.  Discharge Condition: stable  Follow UP--PCP and podiatrist  Diet and Activity recommendation:  As advised  Discharge Instructions    Discharge Instructions     Amb Referral to Nutrition and Diabetic Education   Complete by: As  directed    Please schedule for [place] location. Thanks!   Call MD for:  persistant nausea and vomiting   Complete by: As directed    Call MD for:  redness, tenderness, or signs of infection (pain, swelling, redness, odor or green/yellow discharge around incision site)   Complete by: As directed    Call MD for:  severe uncontrolled pain   Complete by: As directed    Call MD for:  temperature >100.4   Complete by: As directed    Diet - low sodium heart healthy   Complete by: As directed    Diet Carb Modified   Complete by: As directed    Discharge instructions   Complete by: As directed    1)Please call or return if No fever  Or chills or increased Lt foot swelling and/or redness, drainage or pain 2)Please call  Endocrinologist Dr. Ethelle Earl, MD, Endocrinology, Diabetes & Metabolism-- [facility], [address] [place], [place] [address], Phone Number- tel:[phone] 3)Apply Xeroform gauze to left foot once daily, then cover with 4X4 and kerlex.   Discharge wound care:   Complete by: As directed    Apply Xeroform gauze to left foot Q day, then cover with 4X4 and kerlex----   Increase activity slowly   Complete by: As directed          Discharge  Medications     Allergies as of 07/17/2024       Reactions   Dilaudid  [hydromorphone ] Itching, Other (See Comments)   Delusions         Medication List     STOP taking these medications    doxycycline  100 MG capsule Commonly known as: VIBRAMYCIN  Replaced by: doxycycline  100 MG tablet   pantoprazole  40 MG tablet Commonly known as: PROTONIX    TYLENOL  SINUS+HEADACHE PO       TAKE these medications    albuterol  108 (90 Base) MCG/ACT inhaler Commonly known as: VENTOLIN  HFA Inhale 2 puffs into the lungs every 6 (six) hours as needed for wheezing or shortness of breath.   ALPRAZolam  1 MG tablet Commonly known as: XANAX  TAKE 1 TABLET BY MOUTH THREE TIMES DAILY   amphetamine-dextroamphetamine 30 MG tablet Commonly known as: ADDERALL Take 15 mg by mouth daily as needed (focusing).   Bimzelx 320 MG/2ML pen Generic drug: bimekizumab-bkzx Inject into the skin.   clindamycin  1 % gel Commonly known as: CLINDAGEL Apply topically 2 (two) times daily. What changed: how much to take   Dexcom G7 Receiver Devi Use to monitor BG continuously   doxycycline  100 MG tablet Commonly known as: VIBRA -TABS Take 1 tablet (100 mg total) by mouth 2 (two) times daily for 7 days. Replaces: doxycycline  100 MG capsule   DULoxetine  60 MG capsule Commonly known as: CYMBALTA  Take 60 mg by mouth 2 (two) times daily.   gabapentin  800 MG tablet Commonly known as: NEURONTIN  Take 1 tablet (800 mg total) by mouth 2 (two) times daily.   hydrOXYzine  50 MG tablet Commonly known as: ATARAX  Take 2 tablets (100 mg total) by mouth at bedtime.   ibuprofen  200 MG tablet Commonly known as: ADVIL  Take 1 tablet (200 mg total) by mouth every 8 (eight) hours as needed for fever, headache or mild pain (pain score 1-3). Take with Food What changed:  medication strength how much to take reasons to take this additional instructions   losartan 25 MG tablet Commonly known as: COZAAR Take 25 mg by  mouth daily.   multivitamin tablet Take  1 tablet by mouth daily.   NovoLOG  FlexPen 100 UNIT/ML FlexPen Generic drug: insulin  aspart Inject 10-16 Units into the skin 3 (three) times daily before meals. What changed: how much to take   omeprazole  20 MG capsule Commonly known as: PRILOSEC Take 1 capsule (20 mg total) by mouth 2 (two) times daily before a meal. Take 30 min before breakfast and 30 min before dinner   ondansetron  4 MG tablet Commonly known as: Zofran  Take 1 tablet (4 mg total) by mouth daily as needed for nausea or vomiting.   oxyCODONE -acetaminophen  10-325 MG tablet Commonly known as: PERCOCET Take 1 tablet by mouth every 4 (four) hours as needed for pain.   rosuvastatin  10 MG tablet Commonly known as: Crestor  Take 1 tablet (10 mg total) by mouth daily.   silver  sulfADIAZINE  1 % cream Commonly known as: Silvadene  Use to area 2-3 times daily   Sodium Fluoride 5000 PPM 1.1 % Pste Generic drug: Sodium Fluoride BRUSH WITH A PEA SIZED AMOUNT FOR 2 MINS. THEN SPIT OUT THOROUGHLY TWICE DAILY. DO NOT RINSE. NOTHING BY MOUTH FOR 30 MINS.   solifenacin  10 MG tablet Commonly known as: VESIcare  Take 1 tablet (10 mg total) by mouth daily.   Tresiba  FlexTouch 200 UNIT/ML FlexTouch Pen Generic drug: insulin  degludec Inject 90 Units into the skin at bedtime.   Trulicity  0.75 MG/0.5ML Soaj Generic drug: Dulaglutide  Inject 0.75 mg into the skin once a week.   Vitamin C Chew Chew 2 tablets by mouth daily.               Discharge Care Instructions  (From admission, onward)           Start     Ordered   07/17/24 0000  Discharge wound care:       Comments: Apply Xeroform gauze to left foot Q day, then cover with 4X4 and kerlex----   07/17/24 1346            Major procedures and Radiology Reports - PLEASE review detailed and final reports for all details, in brief -   DG Foot Complete Left Result Date: 07/16/2024 CLINICAL DATA:  Plantar swelling,  possible abscess. Stepped on glass 1 month ago. EXAM: LEFT FOOT - COMPLETE 3+ VIEW COMPARISON:  None Available. FINDINGS: Bony structures and joint spaces are within normal. There is focal skin thickening of the mid aspect of the plantar surface of the foot on the lateral film as there is a 4-5 mm radiopaque foreign body within the soft tissue in this region likely representing the glass that patient stepped on per history. IMPRESSION: 1. No acute bony findings. 2. Focal skin thickening of the mid aspect of the plantar surface of the foot with a 4-5 mm radiopaque foreign body within the soft tissue in this region likely representing the glass that patient stepped on per history. Electronically Signed   By: Toribio Agreste M.D.   On: 07/16/2024 10:54   Micro Results   Recent Results (from the past 240 hours)  Culture, blood (routine x 2)     Status: Abnormal   Collection Time: 07/16/24 10:44 AM   Specimen: BLOOD  Result Value Ref Range Status   Specimen Description   Final    BLOOD BLOOD LEFT ARM Performed at [facility], [address] [place], [place] [address]    Special Requests   Final    BOTTLES DRAWN AEROBIC AND ANAEROBIC Blood Culture adequate volume Performed at [facility], [address],  [place], [place] [address]    Culture  Setup Time   Final    ANAEROBIC BOTTLE ONLY GRAM POSITIVE COCCI IN CHAINS Gram Stain Report Called to,Read Back By and Verified With: DOROTHA FALLEN RN 07/17/24 @0837  BY J.WHITE  CRITICAL RESULT CALLED TO, READ BACK BY AND VERIFIED WITH: PHARMD S.HURTH AT 1300 ON 07/17/2024 BY T.SAAD. Performed at [facility], [address], [place], [place] [address]    Culture STREPTOCOCCUS ALACTOLYTICUS (A)  Final   Report Status 07/19/2024 FINAL  Final   Organism ID, Bacteria STREPTOCOCCUS ALACTOLYTICUS  Final      Susceptibility   Streptococcus alactolyticus - MIC*    CLINDAMYCIN  <=0.25 SENSITIVE Sensitive     AMPICILLIN 4 INTERMEDIATE Intermediate      ERYTHROMYCIN 2 RESISTANT Resistant     VANCOMYCIN  1 SENSITIVE Sensitive     CEFTRIAXONE 1 SENSITIVE Sensitive     LEVOFLOXACIN 2 SENSITIVE Sensitive     PENICILLIN  1 INTERMEDIATE Intermediate     * STREPTOCOCCUS ALACTOLYTICUS  Culture, blood (routine x 2)     Status: None   Collection Time: 07/16/24 10:44 AM   Specimen: BLOOD  Result Value Ref Range Status   Specimen Description BLOOD RIGHT ANTECUBITAL  Final   Special Requests   Final    BOTTLES DRAWN AEROBIC AND ANAEROBIC Blood Culture adequate volume   Culture   Final    NO GROWTH 5 DAYS Performed at [facility], [address], [place], [place] [address]    Report Status 07/21/2024 FINAL  Final  Blood Culture ID Panel (Reflexed)     Status: Abnormal   Collection Time: 07/16/24 10:44 AM  Result Value Ref Range Status   Enterococcus faecalis NOT DETECTED NOT DETECTED Final   Enterococcus Faecium NOT DETECTED NOT DETECTED Final   Listeria monocytogenes NOT DETECTED NOT DETECTED Final   Staphylococcus species NOT DETECTED NOT DETECTED Final   Staphylococcus aureus (BCID) NOT DETECTED NOT DETECTED Final   Staphylococcus epidermidis NOT DETECTED NOT DETECTED Final   Staphylococcus lugdunensis NOT DETECTED NOT DETECTED Final   Streptococcus species DETECTED (A) NOT DETECTED Final    Comment: Not Enterococcus species, Streptococcus agalactiae, Streptococcus pyogenes, or Streptococcus pneumoniae. CRITICAL RESULT CALLED TO, READ BACK BY AND VERIFIED WITH: PHARMD S.HURTH AT 1300 ON 07/17/2024 BY T.SAAD.    Streptococcus agalactiae NOT DETECTED NOT DETECTED Final   Streptococcus pneumoniae NOT DETECTED NOT DETECTED Final   Streptococcus pyogenes NOT DETECTED NOT DETECTED Final   A.calcoaceticus-baumannii NOT DETECTED NOT DETECTED Final   Bacteroides fragilis NOT DETECTED NOT DETECTED Final   Enterobacterales NOT DETECTED NOT DETECTED Final   Enterobacter cloacae complex NOT DETECTED NOT DETECTED Final   Escherichia coli NOT DETECTED  NOT DETECTED Final   Klebsiella aerogenes NOT DETECTED NOT DETECTED Final   Klebsiella oxytoca NOT DETECTED NOT DETECTED Final   Klebsiella pneumoniae NOT DETECTED NOT DETECTED Final   Proteus species NOT DETECTED NOT DETECTED Final   Salmonella species NOT DETECTED NOT DETECTED Final   Serratia marcescens NOT DETECTED NOT DETECTED Final   Haemophilus influenzae NOT DETECTED NOT DETECTED Final   Neisseria meningitidis NOT DETECTED NOT DETECTED Final   Pseudomonas aeruginosa NOT DETECTED NOT DETECTED Final   Stenotrophomonas maltophilia NOT DETECTED NOT DETECTED Final   Candida albicans NOT DETECTED NOT DETECTED Final   Candida auris NOT DETECTED NOT DETECTED Final   Candida glabrata NOT DETECTED NOT DETECTED Final   Candida krusei NOT DETECTED NOT DETECTED Final   Candida parapsilosis NOT DETECTED NOT  DETECTED Final   Candida tropicalis NOT DETECTED NOT DETECTED Final   Cryptococcus neoformans/gattii NOT DETECTED NOT DETECTED Final    Comment: Performed at [facility], [address] [place], [place] [address]  Aerobic/Anaerobic Culture w Gram Stain (surgical/deep wound)     Status: None   Collection Time: 07/17/24 12:37 PM   Specimen: Wound  Result Value Ref Range Status   Specimen Description   Final    WOUND Performed at [facility], [address], [place], [place] [address]    Special Requests   Final    NONE Performed at [facility], [address], [place], [place] [address]    Gram Stain   Final    RARE WBC PRESENT, PREDOMINANTLY PMN FEW GRAM POSITIVE COCCI    Culture   Final    ABUNDANT GROUP B STREP(S.AGALACTIAE)ISOLATED TESTING AGAINST S. AGALACTIAE NOT ROUTINELY PERFORMED DUE TO PREDICTABILITY OF AMP/PEN/VAN SUSCEPTIBILITY. MODERATE CORYNEBACTERIUM STRIATUM Standardized susceptibility testing for this organism is not available. MODERATE PREVOTELLA DISIENS BETA LACTAMASE POSITIVE Performed at [facility], [address], [place], [place] [address]     Report Status 07/21/2024 FINAL  Final    Today   Subjective    Judy Nelson today has no new complaints No fever  Or chills   No Nausea, Vomiting or Diarrhea     Patient has been seen and examined prior to discharge   Objective   Blood pressure 129/87, pulse 96, temperature 98 F (36.7 C), temperature source Oral, resp. rate 18, height 5' 7.5 (1.715 m), weight (!) 138.8 kg, last menstrual period 06/30/2024, SpO2 100%.  No intake or output data in the 24 hours ending 07/24/24 1354  Exam Gen:- Awake Alert, morbidly obese, no acute distress  HEENT:- [place].AT, No sclera icterus Neck-Supple Neck,No JVD,.  Lungs-  CTAB , good air movement bilaterally CV- S1, S2 normal, regular Abd-  +ve B.Sounds, Abd Soft, No tenderness, increased truncal adiposity Extremity/Skin:- No  edema,   good pulses Psych-affect is appropriate, oriented x3 Neuro-no new focal deficits, no tremors  Lt Foot-- see Photos in Epic   Data Review   CBC w Diff:  Lab Results  Component Value Date   WBC 10.6 (H) 07/17/2024   HGB 10.8 (L) 07/17/2024   HGB 11.9 07/20/2015   HCT 33.8 (L) 07/17/2024   HCT 36.4 07/20/2015   PLT 269 07/17/2024   PLT 234 07/20/2015   LYMPHOPCT 24 07/16/2024   MONOPCT 5 07/16/2024   EOSPCT 2 07/16/2024   BASOPCT 1 07/16/2024    CMP:  Lab Results  Component Value Date   NA 134 (L) 07/17/2024   NA 141 10/24/2023   K 3.8 07/17/2024   CL 98 07/17/2024   CO2 24 07/17/2024   BUN 6 07/17/2024   BUN 5 (L) 10/24/2023   CREATININE 0.74 07/17/2024   CREATININE 0.44 (L) 10/22/2013   GLU 322 02/09/2022   PROT 7.7 07/16/2024   PROT 6.8 10/24/2023   ALBUMIN  3.6 07/16/2024   ALBUMIN  3.8 (L) 10/24/2023   BILITOT <0.2 07/16/2024   BILITOT 0.2 10/24/2023   ALKPHOS 130 (H) 07/16/2024   AST 11 (L) 07/16/2024   ALT 8 07/16/2024  .  Total Discharge time is about 33 minutes  Rendall Carwin M.D on 07/24/2024 at 1:54 PM  Go to [email or web address] -  for contact info  [facility] - Office  [phone]

## 2024-07-31 ENCOUNTER — Encounter: Payer: Self-pay | Admitting: "Endocrinology

## 2024-07-31 ENCOUNTER — Ambulatory Visit: Admitting: Internal Medicine

## 2024-07-31 ENCOUNTER — Encounter: Payer: Self-pay | Admitting: Internal Medicine

## 2024-07-31 VITALS — BP 137/75 | HR 107 | Temp 98.1°F | Ht 67.5 in | Wt 306.7 lb

## 2024-07-31 DIAGNOSIS — K5904 Chronic idiopathic constipation: Secondary | ICD-10-CM

## 2024-07-31 DIAGNOSIS — R1319 Other dysphagia: Secondary | ICD-10-CM

## 2024-07-31 DIAGNOSIS — K219 Gastro-esophageal reflux disease without esophagitis: Secondary | ICD-10-CM | POA: Diagnosis not present

## 2024-07-31 NOTE — Progress Notes (Signed)
 Primary Care Physician:  Dow Longs, PA-C Primary Gastroenterologist:  Dr. Cindie  Chief Complaint  Patient presents with   Follow-up    Patient here today for a post procedural visit from her recent EGD 05/15/2024. Patient denies any current gi related issues. Patient still uses Omeprazole  20 mg prn.    HPI:   Judy Nelson is a 38 y.o. female who presents to clinic today for follow up visit. Initially seen 05/02/24.  Chronic GERD, esophageal dysphagia: Patient reports progressively worsening esophageal dysphagia.  Feels like food gets stuck in her substernal region.  Eventually passes.  Sometimes she has to hit her chest to make it go down.    Does have allergic rhinitis seasonally.  Also reports a touch of asthma.  Has been on antibiotics recently for abdominal infection status post debridement.  She thought her worsening symptoms were due to Ozempic .  Stopped this with minimal improvement in her symptoms.  EGD 05/15/2024 with mild Schatzki's ring dilated with 18 mm balloon.  Gastritis, normal duodenum.  Pathology: Gastritis negative for H. pylori.  Esophageal biopsies showed nonspecific esophagitis.  Negative for viral or fungal organisms.  Increased omeprazole  to 20 mg twice daily.  Today, states her swallowing is improved s/p recent dilation.  Taking omeprazole  as needed.  Chronic constipation: issue for many years.  Previously on MiraLAX  2 capfuls daily.  Some improvement.  Previously on Dulcolax which caused abdominal cramping.  States sometimes she will go a week without a bowel movement.  Was given samples of Linzess 145 mcg daily on previous visit and states this was too strong.  Taking this as needed if she gets backed up..  Eating prunes regularly.  Past Medical History:  Diagnosis Date   Anemia    Anxiety    Asthma    CSF leak    Depression    GERD (gastroesophageal reflux disease)    Heartburn 09/01/2015   no current med.   History of anemia 01/2014    Hypertension    Non-insulin  dependent type 2 diabetes mellitus (HCC)    Obesity    PTSD (post-traumatic stress disorder)    Scar of breast 08/2015   left   Sinus headache     Past Surgical History:  Procedure Laterality Date   BREAST SURGERY Bilateral    bilateral breast surgery to remove cyst at [facility] 2017   CRANIOTOMY N/A 06/30/2017   Procedure: BIFRONTAL CRANIOTOMY;  Surgeon: Lanis Pupa, MD;  Location: [facility] OR;  Service: Neurosurgery;  Laterality: N/A;  BIFRONTAL CRANIOTOMY Repair of Encephalocele   ESOPHAGEAL DILATION N/A 05/15/2024   Procedure: DILATION, ESOPHAGUS;  Surgeon: Cindie Carlin POUR, DO;  [facility];  Service: Endoscopy;  Laterality: N/A;   ESOPHAGOGASTRODUODENOSCOPY N/A 05/15/2024   Procedure: EGD (ESOPHAGOGASTRODUODENOSCOPY);  Surgeon: Cindie Carlin POUR, DO;  [facility]: [facility];  Service: Endoscopy;  Laterality: N/A;  1:00pm, asa 3   GANGLION CYST EXCISION Left 02/07/2023   Procedure: REMOVAL GANGLION OF WRIST;  Surgeon: Margrette Taft BRAVO, MD;  Location: [facility];  Service: Orthopedics;  Laterality: Left;  LMA   INCISION AND DRAINAGE ABSCESS Left 10/07/2014   Procedure: INCISION AND DRAINAGE LEFT BREAST ABSCESS;  Surgeon: Oneil DELENA Budge, MD;  Location: [facility];  Service: General;  Laterality: Left;   INCISION AND DRAINAGE ABSCESS N/A 01/19/2024   Procedure: INCISION AND DRAINAGE ABDOMINAL WALL ABSCESS;  Surgeon: Evonnie Dorothyann DELENA, DO;  Location: [facility];  Service: General;  Laterality: N/A;   LAPAROSCOPIC OVARIAN CYSTECTOMY  Right 02/05/2014   Procedure: LAPAROSCOPIC right retroperitoneal (NOT OVARIAN) CYSTECTOMY;  Surgeon: Vonn VEAR Inch, MD;  Location: [facility];  Service: Gynecology;  Laterality: Right;   MASS EXCISION Left 09/07/2015   Procedure: Minor SCAR EXCISION LEFT BREAST, PLASTIC CLOSURE;  Surgeon: Elna Pick, MD;  Location: [facility];  Service: Plastics;  Laterality: Left;   TYMPANOSTOMY TUBE PLACEMENT Bilateral  10/17/2014   WOUND DEBRIDEMENT N/A 01/19/2024   Procedure: DEBRIDEMENT OF ABDOMINAL WALL NECROTIZING SOFT TISSUE INFECTION, 25 X 20 X 8 CM;  Surgeon: Evonnie Dorothyann LABOR, DO;  Location: [facility];  Service: General;  Laterality: N/A;    Current Outpatient Medications  Medication Sig Dispense Refill   albuterol  (VENTOLIN  HFA) 108 (90 Base) MCG/ACT inhaler Inhale 2 puffs into the lungs every 6 (six) hours as needed for wheezing or shortness of breath. 8 g 3   ALPRAZolam  (XANAX ) 1 MG tablet TAKE 1 TABLET BY MOUTH THREE TIMES DAILY (Patient taking differently: 1 mg 3 (three) times daily as needed.) 90 tablet 5   amphetamine-dextroamphetamine (ADDERALL) 30 MG tablet Take 15 mg by mouth daily as needed (focusing).     Bioflavonoid Products (VITAMIN C) CHEW Chew 2 tablets by mouth daily.     cephALEXin  (KEFLEX ) 500 MG capsule Take 1 capsule (500 mg total) by mouth 3 (three) times daily for 10 days. 30 capsule 0   clindamycin  (CLINDAGEL) 1 % gel Apply topically 2 (two) times daily. (Patient taking differently: Apply 1 Application topically 2 (two) times daily.) 30 g 11   Continuous Glucose Receiver (DEXCOM G7 RECEIVER) DEVI Use to monitor BG continuously 1 each 0   Continuous Glucose Sensor (DEXCOM G7 SENSOR) MISC CHANGE SENSOR EVERY 10 DAYS 3 each 2   DULoxetine  (CYMBALTA ) 60 MG capsule Take 60 mg by mouth 2 (two) times daily.     gabapentin  (NEURONTIN ) 800 MG tablet Take 1 tablet (800 mg total) by mouth 2 (two) times daily. 60 tablet 5   hydrOXYzine  (ATARAX ) 50 MG tablet Take 2 tablets (100 mg total) by mouth at bedtime. 30 tablet 5   ibuprofen  (ADVIL ) 200 MG tablet Take 1 tablet (200 mg total) by mouth every 8 (eight) hours as needed for fever, headache or mild pain (pain score 1-3). Take with Food 30 tablet 2   insulin  aspart (NOVOLOG  FLEXPEN) 100 UNIT/ML FlexPen Inject 10-16 Units into the skin 3 (three) times daily before meals. (Patient taking differently: Inject 1-12 Units into the skin 3 (three)  times daily before meals.) 30 mL 1   losartan (COZAAR) 25 MG tablet Take 25 mg by mouth daily.     Multiple Vitamin (MULTIVITAMIN) tablet Take 1 tablet by mouth daily.     omeprazole  (PRILOSEC) 20 MG capsule Take 1 capsule (20 mg total) by mouth 2 (two) times daily before a meal. Take 30 min before breakfast and 30 min before dinner (Patient taking differently: Take 20 mg by mouth as needed.) 60 capsule 5   ondansetron  (ZOFRAN ) 4 MG tablet Take 1 tablet (4 mg total) by mouth daily as needed for nausea or vomiting. 30 tablet 1   oxyCODONE -acetaminophen  (PERCOCET) 10-325 MG tablet Take 1 tablet by mouth every 4 (four) hours as needed for pain.     rosuvastatin  (CRESTOR ) 10 MG tablet Take 1 tablet (10 mg total) by mouth daily. 90 tablet 1   silver  sulfADIAZINE  (SILVADENE ) 1 % cream Use to area 2-3 times daily 50 g 11   SODIUM FLUORIDE 5000 PPM 1.1 % PSTE BRUSH  WITH A PEA SIZED AMOUNT FOR 2 MINS. THEN SPIT OUT THOROUGHLY TWICE DAILY. DO NOT RINSE. NOTHING BY MOUTH FOR 30 MINS.     solifenacin  (VESICARE ) 10 MG tablet Take 1 tablet (10 mg total) by mouth daily. 90 tablet 3   BIMZELX 320 MG/2ML pen Inject into the skin. (Patient not taking: Reported on 07/31/2024)     Dulaglutide  (TRULICITY ) 0.75 MG/0.5ML SOAJ Inject 0.75 mg into the skin once a week. (Patient not taking: Reported on 07/31/2024) 2.5 mL 0   insulin  degludec (TRESIBA  FLEXTOUCH) 200 UNIT/ML FlexTouch Pen Inject 90 Units into the skin at bedtime. (Patient not taking: Reported on 07/31/2024) 18 mL 1   No current facility-administered medications for this visit.    Allergies as of 07/31/2024 - Review Complete 07/31/2024  Allergen Reaction Noted   Dilaudid  [hydromorphone ] Itching and Other (See Comments) 07/16/2024    Family History  Problem Relation Age of Onset   Diabetes Father    Hypertension Father    Heart disease Daughter        heart murmur   Cancer Maternal Grandmother        ovarian cancer   Obesity Paternal Grandmother     Diabetes Paternal Grandfather     Social History   Socioeconomic History   Marital status: Single    Spouse name: Not on file   Number of children: Not on file   Years of education: Not on file   Highest education level: Not on file  Occupational History   Not on file  Tobacco Use   Smoking status: Every Day    Current packs/day: 0.50    Average packs/day: 0.5 packs/day for 7.0 years (3.5 ttl pk-yrs)    Types: Cigarettes   Smokeless tobacco: Never  Vaping Use   Vaping status: Never Used  Substance and Sexual Activity   Alcohol use: Not Currently   Drug use: Yes    Types: Marijuana    Comment: THC gummies   Sexual activity: Yes    Birth control/protection: None  Other Topics Concern   Not on file  Social History Narrative   Not on file   Social Drivers of Health   Financial Resource Strain: Not on file  Food Insecurity: No Food Insecurity (07/16/2024)   Hunger Vital Sign    Worried About Running Out of Food in the Last Year: Never true    Ran Out of Food in the Last Year: Never true  Transportation Needs: No Transportation Needs (07/16/2024)   PRAPARE - Administrator, Civil Service (Medical): No    Lack of Transportation (Non-Medical): No  Physical Activity: Not on file  Stress: Not on file  Social Connections: Moderately Integrated (07/16/2024)   Social Connection and Isolation Panel    Frequency of Communication with Friends and Family: More than three times a week    Frequency of Social Gatherings with Friends and Family: More than three times a week    Attends Religious Services: More than 4 times per year    Active Member of [employer] or Organizations: Yes    Attends Banker Meetings: More than 4 times per year    Marital Status: Separated  Intimate Partner Violence: Not At Risk (07/16/2024)   Humiliation, Afraid, Rape, and Kick questionnaire    Fear of Current or Ex-Partner: No    Emotionally Abused: No    Physically Abused: No     Sexually Abused: No    Subjective: Review of Systems  Constitutional:  Negative for chills and fever.  HENT:  Negative for congestion and hearing loss.   Eyes:  Negative for blurred vision and double vision.  Respiratory:  Negative for cough and shortness of breath.   Cardiovascular:  Negative for chest pain and palpitations.  Gastrointestinal:  Positive for constipation and heartburn. Negative for abdominal pain, blood in stool, diarrhea, melena and vomiting.       Dysphagia  Genitourinary:  Negative for dysuria and urgency.  Musculoskeletal:  Negative for joint pain and myalgias.  Skin:  Negative for itching and rash.  Neurological:  Negative for dizziness and headaches.  Psychiatric/Behavioral:  Negative for depression. The patient is not nervous/anxious.        Objective: BP 137/75 (BP Location: Left Arm, Patient Position: Sitting, Cuff Size: Large)   Pulse (!) 107   Temp 98.1 F (36.7 C) (Temporal)   Ht 5' 7.5 (1.715 m)   Wt (!) 306 lb 11.2 oz (139.1 kg)   LMP 07/31/2024 (Approximate)   BMI 47.33 kg/m  Physical Exam Constitutional:      Appearance: Normal appearance. She is obese.  HENT:     Head: Normocephalic and atraumatic.  Eyes:     Extraocular Movements: Extraocular movements intact.     Conjunctiva/sclera: Conjunctivae normal.  Cardiovascular:     Rate and Rhythm: Normal rate and regular rhythm.  Pulmonary:     Effort: Pulmonary effort is normal.     Breath sounds: Normal breath sounds.  Abdominal:     General: Bowel sounds are normal.     Palpations: Abdomen is soft.  Musculoskeletal:        General: No swelling. Normal range of motion.     Cervical back: Normal range of motion and neck supple.  Skin:    General: Skin is warm and dry.     Coloration: Skin is not jaundiced.  Neurological:     General: No focal deficit present.     Mental Status: She is alert and oriented to person, place, and time.  Psychiatric:        Mood and Affect: Mood  normal.        Behavior: Behavior normal.      Assessment/Plan: 1.  Chronic GERD, esophageal dysphagia-improved status post recent esophageal dilation.  Continue on omeprazole , recommended she take this daily 30 minutes before breakfast.  2.  Chronic idiopathic constipation-symptoms not well-controlled on MiraLAX  2 capfuls daily.  Tried Dulcolax in the past which caused stomach cramping.  Samples of Linzess 145 mcg daily on previous visit. Taking this on an as needed basis.  Follow-up in 6 months or sooner if needed.     07/31/2024 10:47 AM   Disclaimer: This note was dictated with voice recognition software. Similar sounding words can inadvertently be transcribed and may not be corrected upon review.

## 2024-07-31 NOTE — Patient Instructions (Signed)
 I am happy to hear that you are doing better.  Continue omeprazole .  I would recommend you take this 1 time daily.  Follow-up in 6 months or sooner if needed.  It was very nice seeing you again today.  Dr. Cindie

## 2024-08-01 ENCOUNTER — Ambulatory Visit: Admitting: Internal Medicine

## 2024-08-08 ENCOUNTER — Ambulatory Visit: Admitting: Surgery

## 2024-08-08 ENCOUNTER — Other Ambulatory Visit: Payer: Self-pay

## 2024-08-08 DIAGNOSIS — E1165 Type 2 diabetes mellitus with hyperglycemia: Secondary | ICD-10-CM

## 2024-08-08 MED ORDER — INSULIN GLARGINE 100 UNIT/ML SOLOSTAR PEN: 90.0000 [IU] | PEN_INJECTOR | Freq: Every day | SUBCUTANEOUS | 0 refills | Status: DC

## 2024-08-12 ENCOUNTER — Ambulatory Visit (INDEPENDENT_AMBULATORY_CARE_PROVIDER_SITE_OTHER)

## 2024-08-12 ENCOUNTER — Encounter: Payer: Self-pay | Admitting: Podiatry

## 2024-08-12 ENCOUNTER — Ambulatory Visit: Admitting: Podiatry

## 2024-08-12 VITALS — Ht 67.5 in | Wt 306.7 lb

## 2024-08-12 DIAGNOSIS — L97422 Non-pressure chronic ulcer of left heel and midfoot with fat layer exposed: Secondary | ICD-10-CM

## 2024-08-12 DIAGNOSIS — E08621 Diabetes mellitus due to underlying condition with foot ulcer: Secondary | ICD-10-CM

## 2024-08-12 MED ORDER — FLUCONAZOLE 150 MG PO TABS: 150.0000 mg | ORAL_TABLET | Freq: Once | ORAL | 0 refills | Status: AC

## 2024-08-12 NOTE — Progress Notes (Signed)
 Chief Complaint  Patient presents with   Diabetic Ulcer    Pt is here to f/u on left foot due to diabetic ulcer, she states the foot feels a lot better.    Subjective:  38 y.o. female with PMHx of diabetes mellitus; uncontrolled presenting for follow-up evaluation of a piece of glass shard which developed into an abscess to the plantar aspect of the left foot.  Last seen in the emergency department on 07/16/2024.      Lab Results  Component Value Date   HGBA1C 10.7 (H) 07/16/2024   HGBA1C 8.7 (H) 01/20/2024   HGBA1C 8.1 (A) 10/25/2023   Past Medical History:  Diagnosis Date   Anemia    Anxiety    Asthma    CSF leak    Depression    GERD (gastroesophageal reflux disease)    Heartburn 09/01/2015   no current med.   History of anemia 01/2014   Hypertension    Non-insulin  dependent type 2 diabetes mellitus (HCC)    Obesity    PTSD (post-traumatic stress disorder)    Scar of breast 08/2015   left   Sinus headache     Past Surgical History:  Procedure Laterality Date   BREAST SURGERY Bilateral    bilateral breast surgery to remove cyst at [facility] 2017   CRANIOTOMY N/A 06/30/2017   Procedure: BIFRONTAL CRANIOTOMY;  Surgeon: Lanis Pupa, MD;  Location: [facility] OR;  Service: Neurosurgery;  Laterality: N/A;  BIFRONTAL CRANIOTOMY Repair of Encephalocele   ESOPHAGEAL DILATION N/A 05/15/2024   Procedure: DILATION, ESOPHAGUS;  Surgeon: Cindie Carlin POUR, DO;  [facility];  Service: Endoscopy;  Laterality: N/A;   ESOPHAGOGASTRODUODENOSCOPY N/A 05/15/2024   Procedure: EGD (ESOPHAGOGASTRODUODENOSCOPY);  Surgeon: Cindie Carlin POUR, DO;  [facility];  Service: Endoscopy;  Laterality: N/A;  1:00pm, asa 3   GANGLION CYST EXCISION Left 02/07/2023   Procedure: REMOVAL GANGLION OF WRIST;  Surgeon: Margrette Taft BRAVO, MD;  Location: [facility];  Service: Orthopedics;  Laterality: Left;  LMA   INCISION AND DRAINAGE ABSCESS Left 10/07/2014   Procedure: INCISION AND  DRAINAGE LEFT BREAST ABSCESS;  Surgeon: Oneil DELENA Budge, MD;  Location: [facility];  Service: General;  Laterality: Left;   INCISION AND DRAINAGE ABSCESS N/A 01/19/2024   Procedure: INCISION AND DRAINAGE ABDOMINAL WALL ABSCESS;  Surgeon: Evonnie Dorothyann DELENA, DO;  Location: [facility];  Service: General;  Laterality: N/A;   LAPAROSCOPIC OVARIAN CYSTECTOMY Right 02/05/2014   Procedure: LAPAROSCOPIC right retroperitoneal (NOT OVARIAN) CYSTECTOMY;  Surgeon: Vonn VEAR Inch, MD;  Location: [facility];  Service: Gynecology;  Laterality: Right;   MASS EXCISION Left 09/07/2015   Procedure: Minor SCAR EXCISION LEFT BREAST, PLASTIC CLOSURE;  Surgeon: Elna Pick, MD;  Location: [facility];  Service: Plastics;  Laterality: Left;   TYMPANOSTOMY TUBE PLACEMENT Bilateral 10/17/2014   WOUND DEBRIDEMENT N/A 01/19/2024   Procedure: DEBRIDEMENT OF ABDOMINAL WALL NECROTIZING SOFT TISSUE INFECTION, 25 X 20 X 8 CM;  Surgeon: Evonnie Dorothyann DELENA, DO;  Location: [facility];  Service: General;  Laterality: N/A;    Allergies  Allergen Reactions   Dilaudid  [Hydromorphone ] Itching and Other (See Comments)    Delusions     LT foot 07/22/2024   LT foot 08/12/2024  Objective/Physical Exam General: The patient is alert and oriented x3 in no acute distress.  Dermatology:  Wound #1 noted to the plantar aspect of the left foot measuring approximately 1.0 x 0.3 x 0.6 cm (LxWxD).  Significant improvement of  the tunneling.  Tunneling noted to the plantar aspect of the fo overall significant improvement.  To the noted ulceration(s), there is no eschar. There is a moderate amount of slough, fibrin, and necrotic tissue noted. Granulation tissue and wound base is red. There is a minimal amount of serosanguineous drainage noted. There is no exposed bone muscle-tendon ligament or joint. There is no malodor. Periwound integrity is intact. Skin is warm, dry and supple bilateral lower extremities.  Vascular: Palpable pedal  pulses bilaterally. No edema or erythema noted. Capillary refill within normal limits.  Clinically no concern for vascular compromise  Neurological: Light touch and protective threshold diminished bilaterally.   Musculoskeletal Exam: Patient ambulatory.  No prior amputations  Assessment: 1.  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 diabetes mellitus w/ peripheral neuropathy 3.  Foreign body glass left foot   Plan of Care:  -Patient was evaluated. - Medically necessary excisional debridement including subcutaneous tissue was performed today using a tissue nipper.  Excisional debridement of the necrotic nonviable tissue down to healthier bleeding viable tissue was performed with postdebridement measurements same as pre- -She is on chronic doxycycline  from a different prescriber for HS.  Continue as prescribed -Currently dealing with a yeast infection secondary to the doxycycline .  Prescription for Diflucan  150 mg tablet -Continue Betadine soaked Xeroform daily with a light dressing -Return to clinic 3 weeks  Thresa EMERSON Sar, DPM [facility]  Dr. Thresa EMERSON Sar, DPM    [address] [place], [place] [address]                Office [phone]  Fax [phone]

## 2024-09-02 ENCOUNTER — Ambulatory Visit: Admitting: Podiatry

## 2024-09-26 ENCOUNTER — Ambulatory Visit: Admitting: "Endocrinology

## 2024-09-27 ENCOUNTER — Ambulatory Visit: Admitting: "Endocrinology

## 2024-09-27 ENCOUNTER — Encounter: Payer: Self-pay | Admitting: "Endocrinology

## 2024-09-27 DIAGNOSIS — E782 Mixed hyperlipidemia: Secondary | ICD-10-CM | POA: Diagnosis not present

## 2024-09-27 DIAGNOSIS — Z794 Long term (current) use of insulin: Secondary | ICD-10-CM | POA: Diagnosis not present

## 2024-09-27 DIAGNOSIS — E1165 Type 2 diabetes mellitus with hyperglycemia: Secondary | ICD-10-CM | POA: Diagnosis not present

## 2024-09-27 MED ORDER — NOVOLOG FLEXPEN 100 UNIT/ML ~~LOC~~ SOPN: 14.0000 [IU] | PEN_INJECTOR | Freq: Three times a day (TID) | SUBCUTANEOUS | 1 refills | Status: AC

## 2024-09-27 MED ORDER — TRESIBA FLEXTOUCH 200 UNIT/ML ~~LOC~~ SOPN: 100.0000 [IU] | PEN_INJECTOR | SUBCUTANEOUS | 2 refills | Status: AC

## 2024-09-27 MED ORDER — TRULICITY 0.75 MG/0.5ML ~~LOC~~ SOAJ: 0.7500 mg | SUBCUTANEOUS | 0 refills | Status: AC

## 2024-09-27 NOTE — Progress Notes (Signed)
 09/27/2024, 9:33 AM  Endocrinology follow-up note   Subjective:    Patient ID: Judy Nelson, female    DOB: 06-02-1986.  Mileidy Atkin is being seen in follow-up after she was seen in consultation for management of currently uncontrolled symptomatic diabetes requested by  Dow Longs, PA-C.   Past Medical History:  Diagnosis Date   Anemia    Anxiety    Asthma    CSF leak    Depression    GERD (gastroesophageal reflux disease)    Heartburn 09/01/2015   no current med.   History of anemia 01/2014   Hypertension    Non-insulin  dependent type 2 diabetes mellitus (HCC)    Obesity    PTSD (post-traumatic stress disorder)    Scar of breast 08/2015   left   Sinus headache     Past Surgical History:  Procedure Laterality Date   BREAST SURGERY Bilateral    bilateral breast surgery to remove cyst at [facility] 2017   CRANIOTOMY N/A 06/30/2017   Procedure: BIFRONTAL CRANIOTOMY;  Surgeon: Lanis Pupa, MD;  Location: [facility] OR;  Service: Neurosurgery;  Laterality: N/A;  BIFRONTAL CRANIOTOMY Repair of Encephalocele   ESOPHAGEAL DILATION N/A 05/15/2024   Procedure: DILATION, ESOPHAGUS;  Surgeon: Cindie Carlin POUR, DO;  Location: [facility];  Service: Endoscopy;  Laterality: N/A;   ESOPHAGOGASTRODUODENOSCOPY N/A 05/15/2024   Procedure: EGD (ESOPHAGOGASTRODUODENOSCOPY);  Surgeon: Cindie Carlin POUR, DO;  Location: [facility];  Service: Endoscopy;  Laterality: N/A;  1:00pm, asa 3   GANGLION CYST EXCISION Left 02/07/2023   Procedure: REMOVAL GANGLION OF WRIST;  Surgeon: Margrette Taft BRAVO, MD;  Location: [facility] ORS;  Service: Orthopedics;  Laterality: Left;  LMA   INCISION AND DRAINAGE ABSCESS Left 10/07/2014   Procedure: INCISION AND DRAINAGE LEFT BREAST ABSCESS;  Surgeon: Oneil DELENA Budge, MD;  Location: [facility] ORS;  Service: General;  Laterality: Left;   INCISION AND DRAINAGE ABSCESS N/A 01/19/2024   Procedure: INCISION  AND DRAINAGE ABDOMINAL WALL ABSCESS;  Surgeon: Evonnie Dorothyann DELENA, DO;  Location: [facility] ORS;  Service: General;  Laterality: N/A;   LAPAROSCOPIC OVARIAN CYSTECTOMY Right 02/05/2014   Procedure: LAPAROSCOPIC right retroperitoneal (NOT OVARIAN) CYSTECTOMY;  Surgeon: Vonn VEAR Inch, MD;  Location: [facility] ORS;  Service: Gynecology;  Laterality: Right;   MASS EXCISION Left 09/07/2015   Procedure: Minor SCAR EXCISION LEFT BREAST, PLASTIC CLOSURE;  Surgeon: Elna Pick, MD;  Location: [facility];  Service: Plastics;  Laterality: Left;   TYMPANOSTOMY TUBE PLACEMENT Bilateral 10/17/2014   WOUND DEBRIDEMENT N/A 01/19/2024   Procedure: DEBRIDEMENT OF ABDOMINAL WALL NECROTIZING SOFT TISSUE INFECTION, 25 X 20 X 8 CM;  Surgeon: Evonnie Dorothyann DELENA, DO;  Location: [facility] ORS;  Service: General;  Laterality: N/A;    Social History   Socioeconomic History   Marital status: Single    Spouse name: Not on file   Number of children: Not on file   Years of education: Not on file   Highest education level: Not on file  Occupational History   Not on file  Tobacco Use   Smoking status: Every Day    Current packs/day: 0.50    Average packs/day: 0.5 packs/day for 7.0 years (3.5 ttl pk-yrs)    Types:  Cigarettes   Smokeless tobacco: Never  Vaping Use   Vaping status: Never Used  Substance and Sexual Activity   Alcohol use: Not Currently   Drug use: Yes    Types: Marijuana    Comment: THC gummies   Sexual activity: Yes    Birth control/protection: None  Other Topics Concern   Not on file  Social History Narrative   Not on file   Social Drivers of Health   Tobacco Use: High Risk (09/27/2024)   Patient History    Smoking Tobacco Use: Every Day    Smokeless Tobacco Use: Never    Passive Exposure: Not on file  Financial Resource Strain: Not on file  Food Insecurity: No Food Insecurity (07/16/2024)   Epic    Worried About Programme Researcher, Broadcasting/film/video in the Last Year: Never true    Ran Out of  Food in the Last Year: Never true  Transportation Needs: No Transportation Needs (07/16/2024)   Epic    Lack of Transportation (Medical): No    Lack of Transportation (Non-Medical): No  Physical Activity: Not on file  Stress: Not on file  Social Connections: Moderately Integrated (07/16/2024)   Social Connection and Isolation Panel    Frequency of Communication with Friends and Family: More than three times a week    Frequency of Social Gatherings with Friends and Family: More than three times a week    Attends Religious Services: More than 4 times per year    Active Member of [employer] or Organizations: Yes    Attends Engineer, Structural: More than 4 times per year    Marital Status: Separated  Depression (PHQ2-9): Not on file  Alcohol Screen: Not on file  Housing: Unknown (07/16/2024)   Epic    Unable to Pay for Housing in the Last Year: No    Number of Times Moved in the Last Year: Not on file    Homeless in the Last Year: No  Utilities: Not At Risk (07/16/2024)   Epic    Threatened with loss of utilities: No  Health Literacy: Not on file    Family History  Problem Relation Age of Onset   Diabetes Father    Hypertension Father    Heart disease Daughter        heart murmur   Cancer Maternal Grandmother        ovarian cancer   Obesity Paternal Grandmother    Diabetes Paternal Grandfather     Outpatient Encounter Medications as of 09/27/2024  Medication Sig   insulin  degludec (TRESIBA  FLEXTOUCH) 200 UNIT/ML FlexTouch Pen Inject 100 Units into the skin daily.   albuterol  (VENTOLIN  HFA) 108 (90 Base) MCG/ACT inhaler Inhale 2 puffs into the lungs every 6 (six) hours as needed for wheezing or shortness of breath.   ALPRAZolam  (XANAX ) 1 MG tablet TAKE 1 TABLET BY MOUTH THREE TIMES DAILY (Patient taking differently: 1 mg 3 (three) times daily as needed.)   amphetamine -dextroamphetamine  (ADDERALL) 30 MG tablet Take 15 mg by mouth daily as needed (focusing).   BIMZELX 320  MG/2ML pen Inject into the skin. (Patient not taking: Reported on 08/12/2024)   Bioflavonoid Products (VITAMIN C) CHEW Chew 2 tablets by mouth daily.   clindamycin  (CLINDAGEL) 1 % gel Apply topically 2 (two) times daily. (Patient taking differently: Apply 1 Application topically 2 (two) times daily.)   Continuous Glucose Receiver (DEXCOM G7 RECEIVER) DEVI Use to monitor BG continuously   Continuous Glucose Sensor (DEXCOM G7 SENSOR) MISC CHANGE SENSOR  EVERY 10 DAYS   Dulaglutide  (TRULICITY ) 0.75 MG/0.5ML SOAJ Inject 0.75 mg into the skin once a week.   DULoxetine  (CYMBALTA ) 60 MG capsule Take 60 mg by mouth 2 (two) times daily.   gabapentin  (NEURONTIN ) 800 MG tablet Take 1 tablet (800 mg total) by mouth 2 (two) times daily.   hydrOXYzine  (ATARAX ) 50 MG tablet Take 2 tablets (100 mg total) by mouth at bedtime.   ibuprofen  (ADVIL ) 200 MG tablet Take 1 tablet (200 mg total) by mouth every 8 (eight) hours as needed for fever, headache or mild pain (pain score 1-3). Take with Food   insulin  aspart (NOVOLOG  FLEXPEN) 100 UNIT/ML FlexPen Inject 14-20 Units into the skin 3 (three) times daily before meals.   losartan  (COZAAR ) 25 MG tablet Take 25 mg by mouth daily.   Multiple Vitamin (MULTIVITAMIN) tablet Take 1 tablet by mouth daily.   omeprazole  (PRILOSEC) 20 MG capsule Take 1 capsule (20 mg total) by mouth 2 (two) times daily before a meal. Take 30 min before breakfast and 30 min before dinner (Patient taking differently: Take 20 mg by mouth as needed.)   ondansetron  (ZOFRAN ) 4 MG tablet Take 1 tablet (4 mg total) by mouth daily as needed for nausea or vomiting.   oxyCODONE -acetaminophen  (PERCOCET) 10-325 MG tablet Take 1 tablet by mouth every 4 (four) hours as needed for pain.   rosuvastatin  (CRESTOR ) 10 MG tablet Take 1 tablet (10 mg total) by mouth daily.   silver  sulfADIAZINE  (SILVADENE ) 1 % cream Use to area 2-3 times daily   SODIUM FLUORIDE 5000 PPM 1.1 % PSTE BRUSH WITH A PEA SIZED AMOUNT FOR 2  MINS. THEN SPIT OUT THOROUGHLY TWICE DAILY. DO NOT RINSE. NOTHING BY MOUTH FOR 30 MINS.   solifenacin  (VESICARE ) 10 MG tablet Take 1 tablet (10 mg total) by mouth daily.   [DISCONTINUED] Dulaglutide  (TRULICITY ) 0.75 MG/0.5ML SOAJ Inject 0.75 mg into the skin once a week. (Patient not taking: Reported on 08/12/2024)   [DISCONTINUED] insulin  aspart (NOVOLOG  FLEXPEN) 100 UNIT/ML FlexPen Inject 10-16 Units into the skin 3 (three) times daily before meals. (Patient taking differently: Inject 1-12 Units into the skin 3 (three) times daily before meals.)   [DISCONTINUED] insulin  glargine (LANTUS ) 100 UNIT/ML Solostar Pen Inject 90 Units into the skin at bedtime.   No facility-administered encounter medications on file as of 09/27/2024.    ALLERGIES: Allergies  Allergen Reactions   Dilaudid  [Hydromorphone ] Itching and Other (See Comments)    Delusions      VACCINATION STATUS: Immunization History  Administered Date(s) Administered   Influenza, Seasonal, Injecte, Preservative Fre 07/17/2024   Influenza,inj,Quad PF,6+ Mos 07/16/2013   Tdap 11/04/2013, 11/16/2021    Diabetes She presents for her follow-up diabetic visit. She has type 2 diabetes mellitus. Onset time: She was diagnosed at approximate age of 25 years. Her disease course has been improving. There are no hypoglycemic associated symptoms. Pertinent negatives for hypoglycemia include no confusion, headaches, pallor or seizures. Associated symptoms include fatigue, polydipsia and polyuria. Pertinent negatives for diabetes include no blurred vision, no chest pain and no polyphagia. There are no hypoglycemic complications. Symptoms are improving. Diabetic complications include peripheral neuropathy. 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  She is status post debridement, currently on wound VAC.  She is status post a course of antibiotics as well.) Risk factors for coronary  artery disease include dyslipidemia, diabetes mellitus, obesity, sedentary lifestyle, tobacco exposure and family history. Current diabetic treatment  includes insulin  injections. Her weight is fluctuating minimally. She is following a generally unhealthy diet. When asked about meal planning, she reported none. She has not had a previous visit with a dietitian. She never participates in exercise. Her home blood glucose trend is fluctuating minimally. Her breakfast blood glucose range is generally >200 mg/dl. Her lunch blood glucose range is generally >200 mg/dl. Her dinner blood glucose range is generally >200 mg/dl. Her bedtime blood glucose range is generally >200 mg/dl. Her overall blood glucose range is >200 mg/dl. (Ms. Strader presents with her CGM showing significantly above target glycemic profile.  She presents with 3% time in range, 28% level 1 hyperglycemia, 69% level 2 hyperglycemia.   She has no hypoglycemia.  Her average blood glucose is 285 mg per DL for the most recent 2 weeks.  Her A1c was 10.7% on July 16, 2024, improving from 11.2% at prior measurement.        ) An ACE inhibitor/angiotensin II receptor blocker is not being taken.  Hyperlipidemia This is a chronic problem. The current episode started more than 1 year ago. The problem is uncontrolled. Exacerbating diseases include diabetes. Factors aggravating her hyperlipidemia include smoking. Pertinent negatives include no chest pain, myalgias or shortness of breath. She is currently on no antihyperlipidemic treatment. Risk factors for coronary artery disease include diabetes mellitus, dyslipidemia, family history, obesity, a sedentary lifestyle and hypertension.  Hypertension This is a chronic problem. The problem is controlled. Pertinent negatives include no blurred vision, chest pain, headaches, palpitations or shortness of breath. Risk factors for coronary artery disease include dyslipidemia, diabetes mellitus, smoking/tobacco  exposure, sedentary lifestyle and family history. Past treatments include calcium  channel blockers.    Objective:       09/27/2024    8:26 AM 08/12/2024   11:11 AM 07/31/2024   10:44 AM  Vitals with BMI  Height 5' 7.5 5' 7.5 5' 7.5  Weight 297 lbs 3 oz 306 lbs 11 oz 306 lbs 11 oz  BMI 45.83 47.3 47.3  Systolic 114  137  Diastolic 80  75  Pulse 96  107    BP 114/80   Pulse 96   Ht 5' 7.5 (1.715 m)   Wt 297 lb 3.2 oz (134.8 kg)   BMI 45.86 kg/m   Wt Readings from Last 3 Encounters:  09/27/24 297 lb 3.2 oz (134.8 kg)  08/12/24 (!) 306 lb 11.2 oz (139.1 kg)  07/31/24 (!) 306 lb 11.2 oz (139.1 kg)      CMP ( most recent) CMP     Component Value Date/Time   NA 134 (L) 07/17/2024 0447   NA 141 10/24/2023 1353   K 3.8 07/17/2024 0447   CL 98 07/17/2024 0447   CO2 24 07/17/2024 0447   GLUCOSE 260 (H) 07/17/2024 0447   BUN 6 07/17/2024 0447   BUN 5 (L) 10/24/2023 1353   CREATININE 0.74 07/17/2024 0447   CREATININE 0.44 (L) 10/22/2013 1247   CALCIUM  9.1 07/17/2024 0447   PROT 7.7 07/16/2024 1044   PROT 6.8 10/24/2023 1353   ALBUMIN  3.6 07/16/2024 1044   ALBUMIN  3.8 (L) 10/24/2023 1353   AST 11 (L) 07/16/2024 1044   ALT 8 07/16/2024 1044   ALKPHOS 130 (H) 07/16/2024 1044   BILITOT <0.2 07/16/2024 1044   BILITOT 0.2 10/24/2023 1353   GFRNONAA >60 07/17/2024 0447   GFRAA >60 03/12/2018 1315     Diabetic Labs (most recent): Lab Results  Component Value Date   HGBA1C 10.7 (H) 07/16/2024  HGBA1C 8.7 (H) 01/20/2024   HGBA1C 8.1 (A) 10/25/2023   MICROALBUR 150 02/22/2024     Lipid Panel ( most recent) Lipid Panel     Component Value Date/Time   CHOL 167 10/24/2023 1353   TRIG 135 10/24/2023 1353   HDL 44 10/24/2023 1353   CHOLHDL 3.8 10/24/2023 1353   LDLCALC 99 10/24/2023 1353   LABVLDL 24 10/24/2023 1353     Lab Results  Component Value Date   TSH 0.928 03/14/2023   TSH 0.910 08/10/2022   TSH 1.41 02/09/2022   TSH 1.580 07/20/2015   TSH  1.250 10/05/2014   FREET4 1.10 03/14/2023   FREET4 1.00 08/10/2022      Assessment & Plan:   1. Uncontrolled type 2 diabetes mellitus with hyperglycemia (HCC)  - Koraline Phillipson has currently uncontrolled symptomatic type 2 DM since  38 years of age.  Ms. Wolfgang presents with her CGM showing significantly above target glycemic profile.  She presents with 3% time in range, 28% level 1 hyperglycemia, 69% level 2 hyperglycemia.   She has no hypoglycemia.  Her average blood glucose is 285 mg per DL for the most recent 2 weeks.  Her A1c was 10.7% on July 16, 2024, improving from 11.2% at prior measurement.     Recent labs reviewed. -She presents with persistent hyperglycemia. -her diabetes is complicated by peripheral neuropathy, obesity/sedentary life, smoking and she remains at a high risk for more acute and chronic complications which include CAD, CVA, CKD, retinopathy, and neuropathy. These are all discussed in detail with her.  In light of her comorbid conditions of obesity, type 2 diabetes, hyperlipidemia, hypertension, she is a candidate for lifestyle medicine.  -She did have significant disengagement from self-care due to depression.    - she acknowledges that there is a room for improvement in her food and drink choices. - Suggestion is made for her to avoid simple carbohydrates  from her diet including Cakes, Sweet Desserts, Ice Cream, Soda (diet and regular), Sweet Tea, Candies, Chips, Cookies, Store Bought Juices, Alcohol , Artificial Sweeteners,  Coffee Creamer, and Sugar-free Products, Lemonade. This will help patient to have more stable blood glucose profile and potentially avoid unintended weight gain.   - I have approached her with the following individualized plan to manage  her diabetes and patient agrees:   - In light of her presentation with significant hyperglycemic burden, she will continue to need multiple daily injections of insulin  in order for her to achieve  control of diabetes to target.    - Accordingly, she is advised to increase Tresiba  to 100 units nightly, increase NovoLog  to 14-20 units 3 times daily AC   for Premeal blood glucose readings above 90 mg per DL.  - she is urged to use her CGM continuously, and she is warned not to take insulin  without proper monitoring per orders.  - she is encouraged to call clinic for blood glucose levels less than 70 or above 200 mg /dl.  - She did not take her Ozempic  for unrelated reason of dysphagia which required EGD and what appears to be esophageal dilation.   - She is advised to restart Trulicity  at 0.75 mg subcutaneously weekly.  This medication will be adjusted as she tolerates. She was taken off of her Farxiga  while she was recovering from necrotizing fasciitis of her abdominal wall.  This may not be a safe or therapeutic option for her because of this reason.  - Specific targets for  A1c;  LDL, HDL,  and Triglycerides were discussed with the patient.  2) Blood Pressure /Hypertension:  Her blood pressure is controlled to target. - Her urine microalbumin is elevated at 150 mg/L today.  She is advised to continue losartan  25 mg p.o. daily at breakfast.  3) Lipids/Hyperlipidemia:   Review of her recent lipid panel showed significant improvement in her lipid panel including LDL at 99 improving from 154.  Her triglycerides are improving from 365 all the way to 135.   She is advised to continue Crestor  10 mg p.o. nightly.     4)  Weight/Diet:  Body mass index is 45.86 kg/m.   Her BMI is high, clearly complicating her diabetes care.   she is  a candidate for modest weight loss.  She GLP-1 receptor agonist intervention above.   I discussed with her the fact that loss of 5 - 10% of her  current body weight will have the most impact on her diabetes management.  Exercise, and detailed carbohydrates information provided  -  detailed on discharge instructions.  5) Chronic Care/Health Maintenance:  -she  is  not on ACEI/ARB and Statin medications and  is encouraged to initiate and continue to follow up with Ophthalmology, Dentist,  Podiatrist at least yearly or according to recommendations, and advised to  quit smoking. I have recommended yearly flu vaccine and pneumonia vaccine at least every 5 years; moderate intensity exercise for up to 150 minutes weekly; and  sleep for at least 7 hours a day.  - she is  advised to maintain close follow up with Dow Longs, PA-C for primary care needs, as well as her other providers for optimal and coordinated care.   I spent  42  minutes in the care of the patient today including review of labs from CMP, Lipids, Thyroid Function, Hematology (current and previous including abstractions from other facilities); face-to-face time discussing  her blood glucose readings/logs, discussing hypoglycemia and hyperglycemia episodes and symptoms, medications doses, her options of short and long term treatment based on the latest standards of care / guidelines;  discussion about incorporating lifestyle medicine;  and documenting the encounter. Risk reduction counseling performed per USPSTF guidelines to reduce  obesity and cardiovascular risk factors.     Please refer to Patient Instructions for Blood Glucose Monitoring and Insulin /Medications Dosing Guide  in media tab for additional information. Please  also refer to  Patient Self Inventory in the Media  tab for reviewed elements of pertinent patient history.  [name] participated in the discussions, expressed understanding, and voiced agreement with the above plans.  All questions were answered to her satisfaction. she is encouraged to contact clinic should she have any questions or concerns prior to her return visit.   Follow up plan: - Return in about 9 weeks (around 11/29/2024) for Bring Meter/CGM Device/Logs- A1c in Office.  Ranny Earl, MD [facility] [address] [place], [place] [address] Phone: [phone]  Fax: [phone]    09/27/2024, 9:33 AM  This note was partially dictated with voice recognition software. Similar sounding words can be transcribed inadequately or may not  be corrected upon review.

## 2024-09-27 NOTE — Patient Instructions (Signed)

## 2024-10-02 ENCOUNTER — Ambulatory Visit: Admitting: Podiatry

## 2024-11-11 ENCOUNTER — Ambulatory Visit: Admitting: Podiatry

## 2024-11-29 ENCOUNTER — Ambulatory Visit: Admitting: "Endocrinology
